# Patient Record
Sex: Female | Born: 1955
Health system: Southern US, Community
[De-identification: ages and names within clinical notes are randomized; demographics above are authoritative.]

## PROBLEM LIST (undated history)

## (undated) DIAGNOSIS — I10 Essential (primary) hypertension: Secondary | ICD-10-CM

## (undated) DIAGNOSIS — K219 Gastro-esophageal reflux disease without esophagitis: Secondary | ICD-10-CM

## (undated) DIAGNOSIS — M109 Gout, unspecified: Secondary | ICD-10-CM

## (undated) DIAGNOSIS — M1990 Unspecified osteoarthritis, unspecified site: Secondary | ICD-10-CM

## (undated) DIAGNOSIS — J40 Bronchitis, not specified as acute or chronic: Secondary | ICD-10-CM

## (undated) DIAGNOSIS — J449 Chronic obstructive pulmonary disease, unspecified: Secondary | ICD-10-CM

## (undated) HISTORY — PX: OTHER SURGICAL HISTORY: SHX169

## (undated) HISTORY — PX: TONSILLECTOMY: SUR1361

## (undated) HISTORY — PX: CHOLECYSTECTOMY: SHX55

---

## 2006-04-20 ENCOUNTER — Observation Stay: Payer: Self-pay | Admitting: Surgery

## 2008-02-16 ENCOUNTER — Emergency Department: Payer: Self-pay | Admitting: Emergency Medicine

## 2009-05-09 ENCOUNTER — Ambulatory Visit: Payer: Self-pay | Admitting: General Practice

## 2010-07-02 DIAGNOSIS — Z860100 Personal history of colon polyps, unspecified: Secondary | ICD-10-CM | POA: Insufficient documentation

## 2010-07-02 DIAGNOSIS — K648 Other hemorrhoids: Secondary | ICD-10-CM | POA: Insufficient documentation

## 2010-08-17 DIAGNOSIS — F17200 Nicotine dependence, unspecified, uncomplicated: Secondary | ICD-10-CM | POA: Insufficient documentation

## 2010-08-17 DIAGNOSIS — M1990 Unspecified osteoarthritis, unspecified site: Secondary | ICD-10-CM | POA: Insufficient documentation

## 2010-08-17 DIAGNOSIS — M130 Polyarthritis, unspecified: Secondary | ICD-10-CM | POA: Insufficient documentation

## 2010-08-17 DIAGNOSIS — D509 Iron deficiency anemia, unspecified: Secondary | ICD-10-CM | POA: Insufficient documentation

## 2010-08-17 DIAGNOSIS — A048 Other specified bacterial intestinal infections: Secondary | ICD-10-CM | POA: Insufficient documentation

## 2011-06-21 ENCOUNTER — Emergency Department: Payer: Self-pay | Admitting: Emergency Medicine

## 2011-07-22 ENCOUNTER — Emergency Department: Payer: Self-pay | Admitting: Emergency Medicine

## 2011-12-15 ENCOUNTER — Emergency Department: Payer: Self-pay | Admitting: Emergency Medicine

## 2012-02-28 DIAGNOSIS — K2100 Gastro-esophageal reflux disease with esophagitis, without bleeding: Secondary | ICD-10-CM | POA: Insufficient documentation

## 2012-12-04 ENCOUNTER — Ambulatory Visit: Payer: Self-pay | Admitting: Family Medicine

## 2013-01-18 ENCOUNTER — Ambulatory Visit: Payer: Self-pay | Admitting: Family Medicine

## 2013-09-25 ENCOUNTER — Emergency Department: Payer: Self-pay | Admitting: Emergency Medicine

## 2013-10-08 ENCOUNTER — Ambulatory Visit: Payer: Self-pay | Admitting: Family Medicine

## 2014-11-05 ENCOUNTER — Encounter: Payer: Self-pay | Admitting: Emergency Medicine

## 2014-11-05 ENCOUNTER — Emergency Department
Admission: EM | Admit: 2014-11-05 | Discharge: 2014-11-05 | Disposition: A | Discharge status: Home / Self Care | Payer: Medicare Other | Attending: Emergency Medicine | Admitting: Emergency Medicine

## 2014-11-05 DIAGNOSIS — I10 Essential (primary) hypertension: Secondary | ICD-10-CM | POA: Insufficient documentation

## 2014-11-05 DIAGNOSIS — N611 Abscess of the breast and nipple: Secondary | ICD-10-CM

## 2014-11-05 DIAGNOSIS — N61 Inflammatory disorders of breast: Secondary | ICD-10-CM | POA: Insufficient documentation

## 2014-11-05 DIAGNOSIS — Z720 Tobacco use: Secondary | ICD-10-CM | POA: Insufficient documentation

## 2014-11-05 HISTORY — DX: Essential (primary) hypertension: I10

## 2014-11-05 MED ORDER — SULFAMETHOXAZOLE-TRIMETHOPRIM 800-160 MG PO TABS: 1.0000 | ORAL_TABLET | Freq: Two times a day (BID) | ORAL | Status: DC

## 2014-11-05 NOTE — ED Notes (Signed)
Pt to ed with c/o drainage from left breast.  Pt states she thinks an insect may have bit her over the weekend.

## 2014-11-05 NOTE — Discharge Instructions (Signed)
Please call and schedule an appointment to have your mammogram. Follow up with your primary care provider this week. Return to the ER for symptoms that change or worsen if you are unable to schedule an appointment.  Abscess An abscess (boil or furuncle) is an infected area on or under the skin. This area is filled with yellowish-white fluid (pus) and other material (debris). HOME CARE   Only take medicines as told by your doctor.  If you were given antibiotic medicine, take it as directed. Finish the medicine even if you start to feel better.  If gauze is used, follow your doctor's directions for changing the gauze.  To avoid spreading the infection:  Keep your abscess covered with a bandage.  Wash your hands well.  Do not share personal care items, towels, or whirlpools with others.  Avoid skin contact with others.  Keep your skin and clothes clean around the abscess.  Keep all doctor visits as told. GET HELP RIGHT AWAY IF:   You have more pain, puffiness (swelling), or redness in the wound site.  You have more fluid or blood coming from the wound site.  You have muscle aches, chills, or you feel sick.  You have a fever. MAKE SURE YOU:   Understand these instructions.  Will watch your condition.  Will get help right away if you are not doing well or get worse. Document Released: 09/01/2007 Document Revised: 09/14/2011 Document Reviewed: 05/28/2011 [facility] Patient Information 2015 [place], [place]. This information is not intended to replace advice given to you by your health care provider. Make sure you discuss any questions you have with your health care provider.

## 2014-11-05 NOTE — ED Provider Notes (Signed)
[facility] Emergency Department Provider Note ____________________________________________  Time seen: Approximately 9:38 AM  I have reviewed the triage vital signs and the nursing notes.   HISTORY  Chief Complaint Abscess   HPI Jackie Macias is a 59 y.o. female who presents to the emergency department for left breast abscess. She states she noticed some draining last night from her left nipple. She has had this happen to her in the past and her primary care provider told her that it was due to the "change of life."   Past Medical History  Diagnosis Date  . Hypertension     There are no active problems to display for this patient.   History reviewed. No pertinent past surgical history.  Current Outpatient Rx  Name  Route  Sig  Dispense  Refill  . sulfamethoxazole-trimethoprim (BACTRIM DS,SEPTRA DS) 800-160 MG per tablet   Oral   Take 1 tablet by mouth 2 (two) times daily.   20 tablet   0     Allergies Review of patient's allergies indicates no known allergies.  History reviewed. No pertinent family history.  Social History History  Substance Use Topics  . Smoking status: Current Every Day Smoker  . Smokeless tobacco: Not on file  . Alcohol Use: Yes    Review of Systems   Constitutional: No fever/chills Eyes: No visual changes. ENT: No congestion or rhinorrhea Cardiovascular: Denies chest pain. Respiratory: Denies shortness of breath. Gastrointestinal: No abdominal pain.  No nausea, no vomiting.  No diarrhea.  No constipation. Genitourinary: Negative for dysuria. Musculoskeletal: Negative for back pain. Skin: Left nipple draining yellow fluid. Neurological: Negative for headaches, focal weakness or numbness.  10-point ROS otherwise negative.  ____________________________________________   PHYSICAL EXAM:  VITAL SIGNS: ED Triage Vitals  Enc Vitals Group     BP 11/05/14 0858 170/82 mmHg     Pulse Rate 11/05/14 0858 83      Resp 11/05/14 0858 18     Temp 11/05/14 0858 98.1 F (36.7 C)     Temp Source 11/05/14 0858 Oral     SpO2 11/05/14 0858 99 %     Weight 11/05/14 0858 234 lb (106.142 kg)     Height 11/05/14 0858 5\' 6"  (1.676 m)     Head Cir --      Peak Flow --      Pain Score 11/05/14 0859 8     Pain Loc --      Pain Edu? --      Excl. in [place]? --     Constitutional: Alert and oriented. Well appearing and in no acute distress. Eyes: Conjunctivae are normal. PERRL. EOMI. Head: Atraumatic. Nose: No congestion/rhinnorhea. Mouth/Throat: Mucous membranes are moist.  Oropharynx non-erythematous. No oral lesions. Neck: No stridor. Breast: No dimpling, no peau d' orange. Left nipple is mildly edematous with scant amount of purulent drainage on the lateral side without cellulitis. Cardiovascular: Normal rate, regular rhythm.  Good peripheral circulation. Respiratory: Normal respiratory effort.  No retractions. Gastrointestinal: Soft and nontender. No distention. No abdominal bruits. Musculoskeletal: No lower extremity tenderness nor edema.  No joint effusions. Neurologic:  Normal speech and language. No gross focal neurologic deficits are appreciated. Speech is normal. No gait instability. Skin: See breast exam Psychiatric: Mood and affect are normal. Speech and behavior are normal.  ____________________________________________   LABS (all labs ordered are listed, but only abnormal results are displayed)  Labs Reviewed - No data to display ____________________________________________  EKG   ____________________________________________  RADIOLOGY   ____________________________________________   PROCEDURES  Procedure(s) performed: None ____________________________________________   INITIAL IMPRESSION / ASSESSMENT AND PLAN / ED COURSE  Pertinent labs & imaging results that were available during my care of the patient were reviewed by me and considered in my medical decision making  (see chart for details).  Patient was advised to take the antibiotics until finished. She was advised to follow-up with her primary care provider to have a mammogram. Patient states that he has been 2 years since her last mammogram and she had a sibling and aunt die from breast cancer. She was advised of the importance of having a mammogram soon. Patient states that she plans on calling today to schedule an appointment with her primary care provider. ____________________________________________   FINAL CLINICAL IMPRESSION(S) / ED DIAGNOSES  Final diagnoses:  Abscess of nipple, left      Victorino Dike, FNP 11/05/14 [place], MD 11/05/14 1348

## 2014-12-04 ENCOUNTER — Other Ambulatory Visit: Payer: Self-pay | Admitting: Family Medicine

## 2014-12-04 DIAGNOSIS — Z1231 Encounter for screening mammogram for malignant neoplasm of breast: Secondary | ICD-10-CM

## 2014-12-13 ENCOUNTER — Ambulatory Visit: Payer: Medicare Other

## 2014-12-26 ENCOUNTER — Other Ambulatory Visit: Payer: Self-pay | Admitting: Family Medicine

## 2014-12-26 ENCOUNTER — Ambulatory Visit
Admission: RE | Admit: 2014-12-26 | Discharge: 2014-12-26 | Disposition: A | Discharge status: Home / Self Care | Payer: Medicare Other | Source: Ambulatory Visit | Attending: Family Medicine | Admitting: Family Medicine

## 2014-12-26 DIAGNOSIS — Z1231 Encounter for screening mammogram for malignant neoplasm of breast: Secondary | ICD-10-CM | POA: Diagnosis not present

## 2015-04-29 ENCOUNTER — Other Ambulatory Visit: Payer: Self-pay | Admitting: Otolaryngology

## 2015-04-29 DIAGNOSIS — R221 Localized swelling, mass and lump, neck: Secondary | ICD-10-CM

## 2015-05-05 ENCOUNTER — Ambulatory Visit: Admission: RE | Admit: 2015-05-05 | Payer: Medicare Other | Source: Ambulatory Visit

## 2015-05-22 ENCOUNTER — Ambulatory Visit
Admission: RE | Admit: 2015-05-22 | Discharge: 2015-05-22 | Disposition: A | Discharge status: Home / Self Care | Payer: Medicare Other | Source: Ambulatory Visit | Attending: Otolaryngology | Admitting: Otolaryngology

## 2015-05-22 DIAGNOSIS — J351 Hypertrophy of tonsils: Secondary | ICD-10-CM | POA: Diagnosis not present

## 2015-05-22 DIAGNOSIS — R221 Localized swelling, mass and lump, neck: Secondary | ICD-10-CM | POA: Diagnosis not present

## 2015-05-22 DIAGNOSIS — I6522 Occlusion and stenosis of left carotid artery: Secondary | ICD-10-CM | POA: Diagnosis not present

## 2015-05-22 LAB — POCT I-STAT CREATININE: Creatinine, Ser: 0.8 mg/dL (ref 0.44–1.00)

## 2015-05-22 MED ORDER — IOHEXOL 350 MG/ML SOLN
75.0000 mL | Freq: Once | INTRAVENOUS | Status: AC | PRN
  Administered 2015-05-22: 75 mL via INTRAVENOUS

## 2015-06-13 ENCOUNTER — Other Ambulatory Visit: Payer: Self-pay | Admitting: Otolaryngology

## 2015-06-13 DIAGNOSIS — R0989 Other specified symptoms and signs involving the circulatory and respiratory systems: Secondary | ICD-10-CM

## 2015-06-19 ENCOUNTER — Other Ambulatory Visit: Payer: Self-pay | Admitting: Radiology

## 2015-06-20 ENCOUNTER — Ambulatory Visit
Admission: RE | Admit: 2015-06-20 | Discharge: 2015-06-20 | Disposition: A | Discharge status: Home / Self Care | Payer: Medicare Other | Source: Ambulatory Visit | Attending: Otolaryngology | Admitting: Otolaryngology

## 2015-06-20 DIAGNOSIS — R0989 Other specified symptoms and signs involving the circulatory and respiratory systems: Secondary | ICD-10-CM

## 2015-06-20 DIAGNOSIS — I10 Essential (primary) hypertension: Secondary | ICD-10-CM | POA: Insufficient documentation

## 2015-06-20 DIAGNOSIS — K118 Other diseases of salivary glands: Secondary | ICD-10-CM | POA: Insufficient documentation

## 2015-06-20 DIAGNOSIS — F17200 Nicotine dependence, unspecified, uncomplicated: Secondary | ICD-10-CM | POA: Insufficient documentation

## 2015-06-20 DIAGNOSIS — Z803 Family history of malignant neoplasm of breast: Secondary | ICD-10-CM | POA: Diagnosis not present

## 2015-06-20 DIAGNOSIS — R221 Localized swelling, mass and lump, neck: Secondary | ICD-10-CM | POA: Insufficient documentation

## 2015-06-20 MED ORDER — SODIUM CHLORIDE 0.9 % IV SOLN
Freq: Once | INTRAVENOUS | Status: AC
  Administered 2015-06-20: 11:00:00 via INTRAVENOUS

## 2015-06-20 MED ORDER — FENTANYL CITRATE (PF) 100 MCG/2ML IJ SOLN
INTRAMUSCULAR | Status: AC | PRN
  Administered 2015-06-20 (×2): 50 ug via INTRAVENOUS

## 2015-06-20 MED ORDER — MIDAZOLAM HCL 2 MG/2ML IJ SOLN
INTRAMUSCULAR | Status: AC | PRN
  Administered 2015-06-20 (×2): 1 mg via INTRAVENOUS

## 2015-06-20 NOTE — H&P (Signed)
Chief Complaint: Patient was seen in consultation today for right parotid lesion biopsy at the request of [place]  Referring Physician(s): Vaught,Creighton  History of Present Illness: Jackie Macias is a 60 y.o. female with a palpable nodule in right parotid region for months.  Recent CT examination demonstrated a dominant right parotid lesion and additional parotid lesions or lymph nodes.  Request for a core biopsy of the dominant right parotid lesion.  Patient is asymptomatic today.  She says the nodule in right upper neck is mildly tender to touch.    Past Medical History  Diagnosis Date  . Hypertension     No past surgical history on file.  Allergies: Review of patient's allergies indicates no known allergies.  Medications: Prior to Admission medications   Medication Sig Start Date End Date Taking? Authorizing Provider  amLODipine (NORVASC) 5 MG tablet Take 5 mg by mouth daily.   Yes Historical Provider, MD  ibuprofen (ADVIL,MOTRIN) 800 MG tablet Take 800 mg by mouth 2 times daily at 12 noon and 4 pm. And prn   Yes Historical Provider, MD  meloxicam (MOBIC) 7.5 MG tablet Take 7.5 mg by mouth 2 (two) times daily.   Yes Historical Provider, MD  ranitidine (ZANTAC) 150 MG capsule Take 150 mg by mouth 2 (two) times daily.   Yes Historical Provider, MD  sulfamethoxazole-trimethoprim (BACTRIM DS,SEPTRA DS) 800-160 MG per tablet Take 1 tablet by mouth 2 (two) times daily. Patient not taking: Reported on 06/20/2015 11/05/14   Victorino Dike, FNP     Family History  Problem Relation Age of Onset  . Breast cancer Sister 25    Social History   Social History  . Marital Status: Single    Spouse Name: N/A  . Number of Children: N/A  . Years of Education: N/A   Social History Main Topics  . Smoking status: Current Every Day Smoker  . Smokeless tobacco: Not on file  . Alcohol Use: Yes  . Drug Use: No  . Sexual Activity: Not on file   Other Topics Concern  . Not  on file   Social History Narrative      Review of Systems  Respiratory: Negative.   Cardiovascular: Negative.   Gastrointestinal: Negative.   Genitourinary: Negative.     Vital Signs: BP 174/84 mmHg  Resp 18  Ht 5\' 6"  (1.676 m)  Wt 230 lb (104.327 kg)  BMI 37.14 kg/m2  SpO2 99%  Physical Exam  Neck:  Palpable nodule in right parotid region, anterior to ear.  1 or 2 nodule structures just below the right mandible angle.    Cardiovascular: Normal rate, regular rhythm and normal heart sounds.   Pulmonary/Chest: Effort normal and breath sounds normal.    Mallampati Score:  MD Evaluation Airway: WNL Heart: WNL Chest/ Lungs: WNL ASA  Classification: 1  Imaging: Ct Soft Tissue Neck W Contrast  05/22/2015  CLINICAL DATA:  Palpable mass right neck 8 months.  Nonpainful EXAM: CT NECK WITH CONTRAST TECHNIQUE: Multidetector CT imaging of the neck was performed using the standard protocol following the bolus administration of intravenous contrast. CONTRAST:  57mL OMNIPAQUE IOHEXOL 350 MG/ML SOLN COMPARISON:  None. FINDINGS: Pharynx and larynx: Mild asymmetry of the right tonsil. Recommend direct visualization. No abscess. Tumor not excluded. Left tonsil normal. No airway compromise. Mild thickening of the soft palate. Epiglottis and larynx normal. Salivary glands: Multiple solid enhancing homogeneous nodules in the right parotid gland. The largest is located in the upper parotid  gland along the superior surface and measures 20 x 26 mm. This could be extra parotid and could be a lymph node. 12 mm intra parotid enhancing nodule in the midportion. 16 mm enhancing nodule just below the right parotid which appears extra parotid in location. 7 mm enhancing solid nodule left superior parotid. These could be multifocal parotid lesions versus lymph nodes. Submandibular gland normal bilaterally. Thyroid: Negative Lymph nodes: Possible parotid lymph nodes bilaterally as described above. No additional  adenopathy in the neck or upper mediastinum. Vascular: Atherosclerotic disease in the carotid bifurcation bilaterally with significant carotid stenosis on the left. Carotid Doppler versus CTA recommended for further evaluation. Jugular vein patent bilaterally. Limited intracranial: Negative Visualized orbits: Negative Mastoids and visualized paranasal sinuses: Negative Skeleton: Negative for cervical spine fracture. No acute bone lesion. Upper chest: Lung apices clear IMPRESSION: Multiple solid enhancing nodules in and around the right parotid gland. Smaller nodule left parotid gland. These may represent multiple lymph nodes. Metastatic disease and lymphoma is a possibility. Multifocal parotid tumor is also a consideration however pleomorphic adenoma is not typically multifocal and Warthin's tumor typically is not homogeneous and solid in appearance on CT. No additional adenopathy in the neck. Enlargement of the right tonsil. Recommend direct visualization and possible biopsy to rule out tumor of the right tonsil. Significant left carotid stenosis. Recommend follow-up CTA of the neck or carotid Doppler. Electronically Signed   By: Franchot Gallo M.D.   On: 05/22/2015 12:08    Labs:  CBC: No results for input(s): WBC, HGB, HCT, PLT in the last 8760 hours.  COAGS: No results for input(s): INR, APTT in the last 8760 hours.  BMP:  Recent Labs  05/22/15 1008  CREATININE 0.80    LIVER FUNCTION TESTS: No results for input(s): BILITOT, AST, ALT, ALKPHOS, PROT, ALBUMIN in the last 8760 hours.  TUMOR MARKERS: No results for input(s): AFPTM, CEA, CA199, CHROMGRNA in the last 8760 hours.  Assessment and Plan:  60 yo with parotid lesions and needs a tissue diagnosis.  Discussed US guided core biopsy of the dominant right parotid lesion.  Risks include bleeding, infection and facial nerve injury.  Patient is agreeable to the procedure and would like to be sedated.  Plan for moderate sedation.     Electronically Signed: Carylon Perches 06/20/2015, 10:45 AM

## 2015-06-20 NOTE — Procedures (Signed)
US guided core biopsies of right parotid lesion.  Total of 7 - 18 gauge cores obtained.  No immediate complication.  Minimal blood loss.

## 2015-06-20 NOTE — Discharge Instructions (Signed)
Needle Biopsy, Care After °These instructions give you information about caring for yourself after your procedure. Your doctor may also give you more specific instructions. Call your doctor if you have any problems or questions after your procedure. °HOME CARE °· Rest as told by your doctor. °· Take medicines only as told by your doctor. °· There are many different ways to close and cover the biopsy site, including stitches (sutures), skin glue, and adhesive strips. Follow instructions from your doctor about: °¨ How to take care of your biopsy site. °¨ When and how you should change your bandage (dressing). °¨ When you should remove your dressing. °¨ Removing whatever was used to close your biopsy site. °· Check your biopsy site every day for signs of infection. Watch for: °¨ Redness, swelling, or pain. °¨ Fluid, blood, or pus. °GET HELP IF: °· You have a fever. °· You have redness, swelling, or pain at the biopsy site, and it lasts longer than a few days. °· You have fluid, blood, or pus coming from the biopsy site. °· You feel sick to your stomach (nauseous). °· You throw up (vomit). °GET HELP RIGHT AWAY IF: °· You are short of breath. °· You have trouble breathing. °· Your chest hurts. °· You feel dizzy or you pass out (faint). °· You have bleeding that does not stop with pressure or a bandage. °· You cough up blood. °· Your belly (abdomen) hurts. °  °This information is not intended to replace advice given to you by your health care provider. Make sure you discuss any questions you have with your health care provider. °  °Document Released: 02/26/2008 Document Revised: 07/30/2014 Document Reviewed: 03/11/2014 °Elsevier Interactive Patient Education ©2016 [employer] Inc[employer] ° °

## 2015-06-23 LAB — SURGICAL PATHOLOGY

## 2015-07-31 ENCOUNTER — Other Ambulatory Visit: Payer: Self-pay | Admitting: Otolaryngology

## 2015-07-31 DIAGNOSIS — R221 Localized swelling, mass and lump, neck: Secondary | ICD-10-CM

## 2015-08-08 ENCOUNTER — Other Ambulatory Visit: Payer: Self-pay | Admitting: Radiology

## 2015-08-11 ENCOUNTER — Ambulatory Visit
Admission: RE | Admit: 2015-08-11 | Discharge: 2015-08-11 | Disposition: A | Discharge status: Home / Self Care | Payer: Medicare Other | Source: Ambulatory Visit | Attending: Otolaryngology | Admitting: Otolaryngology

## 2015-08-11 MED ORDER — SODIUM CHLORIDE 0.9 % IV SOLN: Freq: Once | INTRAVENOUS | Status: DC

## 2015-08-12 ENCOUNTER — Other Ambulatory Visit: Payer: Self-pay | Admitting: Radiology

## 2015-08-14 ENCOUNTER — Ambulatory Visit
Admission: RE | Admit: 2015-08-14 | Discharge: 2015-08-14 | Disposition: A | Discharge status: Home / Self Care | Payer: Medicare Other | Source: Ambulatory Visit | Attending: Otolaryngology | Admitting: Otolaryngology

## 2015-08-14 NOTE — OR Nursing (Signed)
Dr Pryor Ochoa office notified Pt did not come in for lymph node biopsy, I had left message on pt phone to call to reschedule.

## 2015-11-17 ENCOUNTER — Emergency Department: Payer: Medicare Other

## 2015-11-17 ENCOUNTER — Encounter: Payer: Self-pay | Admitting: Emergency Medicine

## 2015-11-17 ENCOUNTER — Emergency Department
Admission: EM | Admit: 2015-11-17 | Discharge: 2015-11-17 | Disposition: A | Discharge status: Home / Self Care | Payer: Medicare Other | Attending: Emergency Medicine | Admitting: Emergency Medicine

## 2015-11-17 DIAGNOSIS — M25551 Pain in right hip: Secondary | ICD-10-CM

## 2015-11-17 DIAGNOSIS — Z79899 Other long term (current) drug therapy: Secondary | ICD-10-CM | POA: Insufficient documentation

## 2015-11-17 DIAGNOSIS — M1611 Unilateral primary osteoarthritis, right hip: Secondary | ICD-10-CM | POA: Insufficient documentation

## 2015-11-17 DIAGNOSIS — F17200 Nicotine dependence, unspecified, uncomplicated: Secondary | ICD-10-CM | POA: Diagnosis not present

## 2015-11-17 DIAGNOSIS — M1610 Unilateral primary osteoarthritis, unspecified hip: Secondary | ICD-10-CM

## 2015-11-17 DIAGNOSIS — I10 Essential (primary) hypertension: Secondary | ICD-10-CM | POA: Insufficient documentation

## 2015-11-17 MED ORDER — OXYCODONE-ACETAMINOPHEN 5-325 MG PO TABS: 1.0000 | ORAL_TABLET | ORAL | 0 refills | Status: DC | PRN

## 2015-11-17 MED ORDER — KETOROLAC TROMETHAMINE 30 MG/ML IJ SOLN
60.0000 mg | Freq: Once | INTRAMUSCULAR | Status: AC
  Administered 2015-11-17: 60 mg via INTRAMUSCULAR
  Filled 2015-11-17: qty 2

## 2015-11-17 MED ORDER — NAPROXEN 500 MG PO TABS: 500.0000 mg | ORAL_TABLET | Freq: Two times a day (BID) | ORAL | 0 refills | Status: DC

## 2015-11-17 NOTE — ED Provider Notes (Signed)
[facility] Emergency Department Provider Note  ____________________________________________  Time seen: Approximately 11:58 AM  I have reviewed the triage vital signs and the nursing notes.   HISTORY  Chief Complaint Hip Pain    HPI Jackie Macias is a 60 y.o. female presents for evaluation of right hip and pelvis pain. Patient states past medical history significant for the same. Is increased over the last several days. In addition she complains of nonspecific left antecubital fossa pain. Denies any trauma.   Past Medical History:  Diagnosis Date  . Hypertension     There are no active problems to display for this patient.   History reviewed. No pertinent surgical history.  Prior to Admission medications   Medication Sig Start Date End Date Taking? Authorizing Provider  amLODipine (NORVASC) 5 MG tablet Take 5 mg by mouth daily.    Historical Provider, MD  ibuprofen (ADVIL,MOTRIN) 800 MG tablet Take 800 mg by mouth 2 times daily at 12 noon and 4 pm. And prn    Historical Provider, MD  meloxicam (MOBIC) 7.5 MG tablet Take 7.5 mg by mouth 2 (two) times daily.    Historical Provider, MD  naproxen (NAPROSYN) 500 MG tablet Take 1 tablet (500 mg total) by mouth 2 (two) times daily with a meal. 11/17/15   Arlyss Repress, PA-C  oxyCODONE-acetaminophen (ROXICET) 5-325 MG tablet Take 1-2 tablets by mouth every 4 (four) hours as needed for severe pain. 11/17/15   Pierce Crane Angelia Hazell, PA-C  ranitidine (ZANTAC) 150 MG capsule Take 150 mg by mouth 2 (two) times daily.    Historical Provider, MD  sulfamethoxazole-trimethoprim (BACTRIM DS,SEPTRA DS) 800-160 MG per tablet Take 1 tablet by mouth 2 (two) times daily. Patient not taking: Reported on 06/20/2015 11/05/14   Victorino Dike, FNP    Allergies Review of patient's allergies indicates no known allergies.  Family History  Problem Relation Age of Onset  . Breast cancer Sister 7    Social History Social History   Substance Use Topics  . Smoking status: Current Every Day Smoker  . Smokeless tobacco: Former Systems developer  . Alcohol use Yes    Review of Systems Constitutional: No fever/chills Cardiovascular: Denies chest pain. Respiratory: Denies shortness of breath. Gastrointestinal: No abdominal pain.  No nausea, no vomiting.  No diarrhea.  No constipation. Musculoskeletal: positive for right hip and pelvis pain. Positive for left antecubital fossa pain. Skin: Negative for rash. Neurological: Negative for headaches, focal weakness or numbness.  10-point ROS otherwise negative.  ____________________________________________   PHYSICAL EXAM:  VITAL SIGNS: ED Triage Vitals  Enc Vitals Group     BP 11/17/15 1138 (!) 146/80     Pulse Rate 11/17/15 1138 79     Resp 11/17/15 1138 18     Temp 11/17/15 1138 98.6 F (37 C)     Temp Source 11/17/15 1138 Oral     SpO2 11/17/15 1138 98 %     Weight 11/17/15 1139 234 lb (106.1 kg)     Height 11/17/15 1139 5\' 6"  (1.676 m)     Head Circumference --      Peak Flow --      Pain Score 11/17/15 1139 8     Pain Loc --      Pain Edu? --      Excl. in [place]? --     Constitutional: Alert and oriented. Well appearing and in no acute distress. Cardiovascular: Normal rate, regular rhythm. Grossly normal heart sounds.  Good peripheral  circulation. Respiratory: Normal respiratory effort.  No retractions. Lungs CTAB. Musculoskeletal: No lower extremity tenderness nor edema.  No joint effusions.Straight leg raise positive for right lower extremity. Increased point tenderness to the hip. Neurologic:  Normal speech and language. No gross focal neurologic deficits are appreciated. No gait instability. Skin:  Skin is warm, dry and intact. No rash noted. Psychiatric: Mood and affect are normal. Speech and behavior are normal.  ____________________________________________   LABS (all labs ordered are listed, but only abnormal results are displayed)  Labs Reviewed - No  data to display ____________________________________________  EKG   ____________________________________________  RADIOLOGY  IMPRESSION:  Advanced osteoarthritic changes of the right hip with near complete  effacement of the joint space, multiple subchondral erosions and  cysts and small osteophyte formation.    ____________________________________________   PROCEDURES  Procedure(s) performed: None  Critical Care performed: No  ____________________________________________   INITIAL IMPRESSION / ASSESSMENT AND PLAN / ED COURSE  Pertinent labs & imaging results that were available during my care of the patient were reviewed by me and considered in my medical decision making (see chart for details). Review of the [place] CSRS was performed in accordance of the [place] prior to dispensing any controlled drugs.  Severe osteoarthritis of the right hip. Reassurance provided to the patient follow-up with orthopedics on-call. Rx given for Percocet and Naprosyn. She voices no other emergency medical complaints at this time.  Clinical Course    ____________________________________________   FINAL CLINICAL IMPRESSION(S) / ED DIAGNOSES  Final diagnoses:  Right hip pain  Osteoarthritis of one hip, unspecified osteoarthritis type     This chart was dictated using voice recognition software/Dragon. Despite best efforts to proofread, errors can occur which can change the meaning. Any change was purely unintentional.    Arlyss Repress, PA-C 11/17/15 1323    Earleen Newport, MD 11/17/15 1359

## 2015-11-17 NOTE — ED Triage Notes (Signed)
Pt presents with right hip pain for years. Has seen Dr Sabra Heck in the past for same. No injury.

## 2016-02-03 ENCOUNTER — Emergency Department
Admission: EM | Admit: 2016-02-03 | Discharge: 2016-02-03 | Discharge status: Against Medical Advice | Payer: Medicare Other | Attending: Emergency Medicine | Admitting: Emergency Medicine

## 2016-02-03 ENCOUNTER — Emergency Department: Payer: Medicare Other

## 2016-02-03 ENCOUNTER — Encounter: Payer: Self-pay | Admitting: Emergency Medicine

## 2016-02-03 DIAGNOSIS — Z791 Long term (current) use of non-steroidal anti-inflammatories (NSAID): Secondary | ICD-10-CM | POA: Diagnosis not present

## 2016-02-03 DIAGNOSIS — J449 Chronic obstructive pulmonary disease, unspecified: Secondary | ICD-10-CM | POA: Insufficient documentation

## 2016-02-03 DIAGNOSIS — F17200 Nicotine dependence, unspecified, uncomplicated: Secondary | ICD-10-CM | POA: Diagnosis not present

## 2016-02-03 DIAGNOSIS — Z79899 Other long term (current) drug therapy: Secondary | ICD-10-CM | POA: Diagnosis not present

## 2016-02-03 DIAGNOSIS — I10 Essential (primary) hypertension: Secondary | ICD-10-CM | POA: Diagnosis not present

## 2016-02-03 DIAGNOSIS — R7989 Other specified abnormal findings of blood chemistry: Secondary | ICD-10-CM | POA: Insufficient documentation

## 2016-02-03 DIAGNOSIS — R0602 Shortness of breath: Secondary | ICD-10-CM | POA: Diagnosis present

## 2016-02-03 DIAGNOSIS — J441 Chronic obstructive pulmonary disease with (acute) exacerbation: Secondary | ICD-10-CM

## 2016-02-03 DIAGNOSIS — R778 Other specified abnormalities of plasma proteins: Secondary | ICD-10-CM

## 2016-02-03 HISTORY — DX: Chronic obstructive pulmonary disease, unspecified: J44.9

## 2016-02-03 HISTORY — DX: Gastro-esophageal reflux disease without esophagitis: K21.9

## 2016-02-03 HISTORY — DX: Gout, unspecified: M10.9

## 2016-02-03 LAB — BASIC METABOLIC PANEL
Anion gap: 7 (ref 5–15)
BUN: 14 mg/dL (ref 6–20)
CO2: 25 mmol/L (ref 22–32)
CREATININE: 0.82 mg/dL (ref 0.44–1.00)
Calcium: 9.1 mg/dL (ref 8.9–10.3)
Chloride: 105 mmol/L (ref 101–111)
GFR calc Af Amer: >60 mL/min (ref >60)
Glucose, Bld: 112 mg/dL — ABNORMAL HIGH (ref 65–99)
Potassium: 3.7 mmol/L (ref 3.5–5.1)
SODIUM: 137 mmol/L (ref 135–145)

## 2016-02-03 LAB — CBC
HCT: 36.7 % (ref 35.0–47.0)
Hemoglobin: 12.4 g/dL (ref 12.0–16.0)
MCH: 33.6 pg (ref 26.0–34.0)
MCHC: 33.8 g/dL (ref 32.0–36.0)
MCV: 99.2 fL (ref 80.0–100.0)
PLATELETS: 266 10*3/uL (ref 150–440)
RBC: 3.7 MIL/uL — ABNORMAL LOW (ref 3.80–5.20)
RDW: 14.7 % — AB (ref 11.5–14.5)
WBC: 6.1 10*3/uL (ref 3.6–11.0)

## 2016-02-03 LAB — TROPONIN I: Troponin I: 0.07 ng/mL (ref <0.03)

## 2016-02-03 MED ORDER — ALBUTEROL SULFATE (2.5 MG/3ML) 0.083% IN NEBU
5.0000 mg | INHALATION_SOLUTION | Freq: Once | RESPIRATORY_TRACT | Status: AC
  Administered 2016-02-03: 5 mg via RESPIRATORY_TRACT
  Filled 2016-02-03: qty 6

## 2016-02-03 MED ORDER — IPRATROPIUM-ALBUTEROL 0.5-2.5 (3) MG/3ML IN SOLN: 3.0000 mL | Freq: Once | RESPIRATORY_TRACT | Status: DC

## 2016-02-03 MED ORDER — ALBUTEROL SULFATE (2.5 MG/3ML) 0.083% IN NEBU
INHALATION_SOLUTION | RESPIRATORY_TRACT | Status: AC
  Filled 2016-02-03: qty 3

## 2016-02-03 NOTE — ED Notes (Signed)
Patient transported to radiology

## 2016-02-03 NOTE — ED Notes (Signed)
Patient explained risks of leaving. Verbalizes understanding. Signed AMA form.

## 2016-02-03 NOTE — ED Provider Notes (Signed)
[facility] Emergency Department Provider Note        Time seen: ----------------------------------------- 1:27 PM on 02/03/2016 -----------------------------------------    I have reviewed the triage vital signs and the nursing notes.   HISTORY  Chief Complaint Shortness of Breath    HPI Jackie Macias is a 60 y.o. female who presents to the ER for shortness of breath last 4 days. Patient is tachypnea but able to talk in complete sentences with no accessory muscle use on arrival. She reports productive cough with yellow-green mucus for the last 4 days. She's not had any feverswakes up sweating for the past few nights. She has a history of COPD, has been using inhalers, has been placed on steroids by her doctor.   Past Medical History:  Diagnosis Date  . COPD (chronic obstructive pulmonary disease) ([place])   . GERD (gastroesophageal reflux disease)   . Gout   . Hypertension     There are no active problems to display for this patient.   Past Surgical History:  Procedure Laterality Date  . CHOLECYSTECTOMY      Allergies Patient has no known allergies.  Social History Social History  Substance Use Topics  . Smoking status: Current Every Day Smoker    Packs/day: 0.50  . Smokeless tobacco: Former Systems developer  . Alcohol use Yes     Comment: occasional    Review of Systems Constitutional: Negative for fever. Cardiovascular: Negative for chest pain. Respiratory: Positive for shortness of breath, cough Gastrointestinal: Negative for abdominal pain, vomiting and diarrhea. Genitourinary: Negative for dysuria. Musculoskeletal: Negative for back pain. Skin: Negative for rash. Neurological: Negative for headaches, focal weakness or numbness.  10-point ROS otherwise negative.  ____________________________________________   PHYSICAL EXAM:  VITAL SIGNS: ED Triage Vitals  Enc Vitals Group     BP 02/03/16 1147 (!) 125/98     Pulse Rate 02/03/16  1147 74     Resp 02/03/16 1147 (!) 22     Temp 02/03/16 1147 98.8 F (37.1 C)     Temp Source 02/03/16 1147 Oral     SpO2 02/03/16 1147 96 %     Weight 02/03/16 1148 234 lb (106.1 kg)     Height 02/03/16 1148 5\' 6"  (1.676 m)     Head Circumference --      Peak Flow --      Pain Score 02/03/16 1148 8     Pain Loc --      Pain Edu? --      Excl. in [place]? --     Constitutional: Alert and oriented. Well appearing and in no distress. Eyes: Conjunctivae are normal. PERRL. Normal extraocular movements. ENT   Head: Normocephalic and atraumatic.   Nose: No congestion/rhinnorhea.   Mouth/Throat: Mucous membranes are moist.   Neck: No stridor. Cardiovascular: Normal rate, regular rhythm. No murmurs, rubs, or gallops. Respiratory: Mild wheezing, rhonchi Gastrointestinal: Soft and nontender. Normal bowel sounds Musculoskeletal: Nontender with normal range of motion in all extremities. No lower extremity tenderness nor edema. Neurologic:  Normal speech and language. No gross focal neurologic deficits are appreciated.  Skin:  Skin is warm, dry and intact. No rash noted. Psychiatric: Mood and affect are normal. Speech and behavior are normal.  ____________________________________________  EKG: Interpreted by me. Sinus rhythm with sinus arrhythmia, rate is 71 bpm, normal PR interval, normal QRS, normal QT, normal axis.  ____________________________________________  ED COURSE:  Pertinent labs & imaging results that were available during my care of the patient  were reviewed by me and considered in my medical decision making (see chart for details). Clinical Course   Patient is in no distress, we will assess with labs and imaging. She'll receive a DuoNeb.  Procedures ____________________________________________   LABS (pertinent positives/negatives)  Labs Reviewed  BASIC METABOLIC PANEL - Abnormal; Notable for the following:       Result Value   Glucose, Bld 112 (*)    All  other components within normal limits  CBC - Abnormal; Notable for the following:    RBC 3.70 (*)    RDW 14.7 (*)    All other components within normal limits  TROPONIN I - Abnormal; Notable for the following:    Troponin I 0.07 (*)    All other components within normal limits    RADIOLOGY Images were viewed by me  Chest x-ray IMPRESSION: 1. Cannot exclude right hilar adenopathy.  2. Cardiomegaly.  No evidence of overt congestive heart failure.  3. Low lung volumes.  ____________________________________________  FINAL ASSESSMENT AND PLAN  COPD exacerbation, elevated troponin  Plan: Patient with labs and imaging as dictated above. Patient was shortness of breath which was likely due to COPD exacerbation. Troponin was elevated and I described this to her in detail that this was related to stress on her heart. I advised that she may have had a heart attack. She has advised she will not be staying and does not want further treatment. She left AGAINST MEDICAL ADVICE.   Earleen Newport, MD   Note: This dictation was prepared with Dragon dictation. Any transcriptional errors that result from this process are unintentional    Earleen Newport, MD 02/03/16 1401

## 2016-02-03 NOTE — ED Notes (Signed)
Patient requesting to leave AMA.  MD notified 

## 2016-02-03 NOTE — ED Triage Notes (Signed)
Pt reports SOB for the past 4 days. In triage, patient is tachypnic but able to talk in complete sentences with no accessory muscle use. Pt reports productive cough with yellow and green mucus for 4 days. Pt unsure of fevers but reports waking up sweating the past few nights. Patient has history of COPD.

## 2016-02-11 ENCOUNTER — Other Ambulatory Visit: Payer: Self-pay | Admitting: Family Medicine

## 2016-02-11 DIAGNOSIS — Z1231 Encounter for screening mammogram for malignant neoplasm of breast: Secondary | ICD-10-CM

## 2016-03-23 ENCOUNTER — Ambulatory Visit: Payer: Medicare Other | Attending: Family Medicine

## 2016-06-15 ENCOUNTER — Ambulatory Visit: Payer: Medicare Other | Attending: Family Medicine

## 2016-08-03 ENCOUNTER — Ambulatory Visit
Admission: RE | Admit: 2016-08-03 | Discharge: 2016-08-03 | Disposition: A | Discharge status: Home / Self Care | Payer: Medicare Other | Source: Ambulatory Visit | Attending: Family Medicine | Admitting: Family Medicine

## 2016-08-03 DIAGNOSIS — Z1231 Encounter for screening mammogram for malignant neoplasm of breast: Secondary | ICD-10-CM | POA: Diagnosis present

## 2016-12-27 HISTORY — PX: JOINT REPLACEMENT: SHX530

## 2017-01-05 ENCOUNTER — Encounter
Admission: RE | Admit: 2017-01-05 | Discharge: 2017-01-05 | Disposition: A | Discharge status: Home / Self Care | Payer: Medicare Other | Source: Ambulatory Visit | Attending: Orthopedic Surgery | Admitting: Orthopedic Surgery

## 2017-01-05 ENCOUNTER — Ambulatory Visit: Payer: Self-pay | Admitting: Orthopedic Surgery

## 2017-01-05 DIAGNOSIS — Z01810 Encounter for preprocedural cardiovascular examination: Secondary | ICD-10-CM | POA: Diagnosis present

## 2017-01-05 DIAGNOSIS — J449 Chronic obstructive pulmonary disease, unspecified: Secondary | ICD-10-CM | POA: Insufficient documentation

## 2017-01-05 DIAGNOSIS — R9431 Abnormal electrocardiogram [ECG] [EKG]: Secondary | ICD-10-CM | POA: Insufficient documentation

## 2017-01-05 DIAGNOSIS — Z01812 Encounter for preprocedural laboratory examination: Secondary | ICD-10-CM | POA: Insufficient documentation

## 2017-01-05 DIAGNOSIS — M1611 Unilateral primary osteoarthritis, right hip: Secondary | ICD-10-CM | POA: Insufficient documentation

## 2017-01-05 HISTORY — DX: Unspecified osteoarthritis, unspecified site: M19.90

## 2017-01-05 HISTORY — DX: Bronchitis, not specified as acute or chronic: J40

## 2017-01-05 LAB — URINALYSIS, COMPLETE (UACMP) WITH MICROSCOPIC
Bilirubin Urine: NEGATIVE
Glucose, UA: NEGATIVE mg/dL
Ketones, ur: NEGATIVE mg/dL
Leukocytes, UA: NEGATIVE
Nitrite: NEGATIVE
PROTEIN: NEGATIVE mg/dL
SPECIFIC GRAVITY, URINE: 1.021 (ref 1.005–1.030)
pH: 6 (ref 5.0–8.0)

## 2017-01-05 LAB — TYPE AND SCREEN
ABO/RH(D): A POS
ANTIBODY SCREEN: NEGATIVE

## 2017-01-05 LAB — SURGICAL PCR SCREEN
MRSA, PCR: NEGATIVE
Staphylococcus aureus: NEGATIVE

## 2017-01-05 LAB — BASIC METABOLIC PANEL
ANION GAP: 7 (ref 5–15)
BUN: 13 mg/dL (ref 6–20)
CALCIUM: 9.3 mg/dL (ref 8.9–10.3)
CO2: 27 mmol/L (ref 22–32)
Chloride: 105 mmol/L (ref 101–111)
Creatinine, Ser: 0.93 mg/dL (ref 0.44–1.00)
Glucose, Bld: 96 mg/dL (ref 65–99)
POTASSIUM: 3.5 mmol/L (ref 3.5–5.1)
SODIUM: 139 mmol/L (ref 135–145)

## 2017-01-05 LAB — CBC
HCT: 37.7 % (ref 35.0–47.0)
Hemoglobin: 12.8 g/dL (ref 12.0–16.0)
MCH: 33.7 pg (ref 26.0–34.0)
MCHC: 34 g/dL (ref 32.0–36.0)
MCV: 99.1 fL (ref 80.0–100.0)
PLATELETS: 285 10*3/uL (ref 150–440)
RBC: 3.81 MIL/uL (ref 3.80–5.20)
RDW: 15.1 % — AB (ref 11.5–14.5)
WBC: 4.7 10*3/uL (ref 3.6–11.0)

## 2017-01-05 LAB — PROTIME-INR
INR: 1.04
PROTHROMBIN TIME: 13.5 s (ref 11.4–15.2)

## 2017-01-05 LAB — APTT: APTT: 30 s (ref 24–36)

## 2017-01-05 NOTE — Pre-Procedure Instructions (Signed)
Called Dr Amie Critchley regarding abnormal EKG, medical clearance requested.  PCP and Dr Alvira Philips office notified.

## 2017-01-05 NOTE — Patient Instructions (Signed)
Your procedure is scheduled on: 01/17/17 Mon Report to Same Day Surgery 2nd floor medical mall [facility] Entrance-take elevator on left to 2nd floor.  Check in with surgery information desk.) To find out your arrival time please call [phone] between 1PM - 3PM on 01/14/17 Fri  Remember: Instructions that are not followed completely may result in serious medical risk, up to and including death, or upon the discretion of your surgeon and anesthesiologist your surgery may need to be rescheduled.    _x___ 1. Do not eat food after midnight the night before your procedure. You may drink clear liquids up to 2 hours before you are scheduled to arrive at the hospital for your procedure.  Do not drink clear liquids within 2 hours of your scheduled arrival to the hospital.  Clear liquids include  --Water or Apple juice without pulp  --Clear carbohydrate beverage such as ClearFast or Gatorade  --Black Coffee or Clear Tea (No milk, no creamers, do not add anything to                  the coffee or Tea Type 1 and type 2 diabetics should only drink water.  No gum chewing or hard candies.     __x__ 2. No Alcohol for 24 hours before or after surgery.   __x__3. No Smoking for 24 prior to surgery.   ____  4. Bring all medications with you on the day of surgery if instructed.    __x__ 5. Notify your doctor if there is any change in your medical condition     (cold, fever, infections).     Do not wear jewelry, make-up, hairpins, clips or nail polish.  Do not wear lotions, powders, or perfumes. You may wear deodorant.  Do not shave 48 hours prior to surgery. Men may shave face and neck.  Do not bring valuables to the hospital.    [facility] is not responsible for any belongings or valuables.               Contacts, dentures or bridgework may not be worn into surgery.  Leave your suitcase in the car. After surgery it may be brought to your room.  For patients admitted to the hospital, discharge  time is determined by your                       treatment team.   Patients discharged the day of surgery will not be allowed to drive home.  You will need someone to drive you home and stay with you the night of your procedure.    Please read over the following fact sheets that you were given:   [facility] Preparing for Surgery and or MRSA Information   _x___ Take anti-hypertensive listed below, cardiac, seizure, asthma,     anti-reflux and psychiatric medicines. These include:  1. amLODipine (NORVASC) 10 MG tablet  2.ranitidine (ZANTAC) 150 MG tablet  3.  4.  5.  6.  ____Fleets enema or Magnesium Citrate as directed.   _x___ Use CHG Soap or sage wipes as directed on instruction sheet   ____ Use inhalers on the day of surgery and bring to hospital day of surgery  ____ Stop Metformin and Janumet 2 days prior to surgery.    ____ Take 1/2 of usual insulin dose the night before surgery and none on the morning     surgery.   _x___ Follow recommendations from Cardiologist, Pulmonologist or PCP regarding  stopping Aspirin, Coumadin, Plavix ,Eliquis, Effient, or Pradaxa, and Pletal.  X____Stop Anti-inflammatories such as Advil, Aleve, Ibuprofen, Motrin, Naproxen, Naprosyn, Goodies powders or aspirin products. OK to take Tylenol and                          Celebrex.   _x___ Stop supplements until after surgery.  But may continue Vitamin D, Vitamin B,       and multivitamin.   ____ Bring C-Pap to the hospital.

## 2017-01-17 ENCOUNTER — Inpatient Hospital Stay: Payer: Medicare Other

## 2017-01-17 ENCOUNTER — Encounter: Payer: Self-pay | Admitting: *Deleted

## 2017-01-17 ENCOUNTER — Inpatient Hospital Stay: Payer: Medicare Other | Admitting: Certified Registered Nurse Anesthetist

## 2017-01-17 ENCOUNTER — Encounter
Admission: RE | Discharge status: Against Medical Advice | Payer: Self-pay | Source: Ambulatory Visit | Attending: Orthopedic Surgery

## 2017-01-17 ENCOUNTER — Inpatient Hospital Stay
Admission: RE | Admit: 2017-01-17 | Discharge: 2017-01-18 | DRG: 470 | Discharge status: Against Medical Advice | Payer: Medicare Other | Source: Ambulatory Visit | Attending: Orthopedic Surgery | Admitting: Orthopedic Surgery

## 2017-01-17 DIAGNOSIS — F17210 Nicotine dependence, cigarettes, uncomplicated: Secondary | ICD-10-CM | POA: Diagnosis present

## 2017-01-17 DIAGNOSIS — R51 Headache: Secondary | ICD-10-CM | POA: Diagnosis present

## 2017-01-17 DIAGNOSIS — M1710 Unilateral primary osteoarthritis, unspecified knee: Secondary | ICD-10-CM | POA: Diagnosis present

## 2017-01-17 DIAGNOSIS — Z791 Long term (current) use of non-steroidal anti-inflammatories (NSAID): Secondary | ICD-10-CM | POA: Diagnosis not present

## 2017-01-17 DIAGNOSIS — Z79899 Other long term (current) drug therapy: Secondary | ICD-10-CM

## 2017-01-17 DIAGNOSIS — Z96649 Presence of unspecified artificial hip joint: Secondary | ICD-10-CM

## 2017-01-17 DIAGNOSIS — J449 Chronic obstructive pulmonary disease, unspecified: Secondary | ICD-10-CM | POA: Diagnosis present

## 2017-01-17 DIAGNOSIS — I10 Essential (primary) hypertension: Secondary | ICD-10-CM | POA: Diagnosis present

## 2017-01-17 DIAGNOSIS — K219 Gastro-esophageal reflux disease without esophagitis: Secondary | ICD-10-CM | POA: Diagnosis present

## 2017-01-17 DIAGNOSIS — Z09 Encounter for follow-up examination after completed treatment for conditions other than malignant neoplasm: Secondary | ICD-10-CM

## 2017-01-17 DIAGNOSIS — M1611 Unilateral primary osteoarthritis, right hip: Secondary | ICD-10-CM | POA: Diagnosis present

## 2017-01-17 DIAGNOSIS — Z419 Encounter for procedure for purposes other than remedying health state, unspecified: Secondary | ICD-10-CM

## 2017-01-17 DIAGNOSIS — Z96641 Presence of right artificial hip joint: Secondary | ICD-10-CM

## 2017-01-17 HISTORY — PX: TOTAL HIP ARTHROPLASTY: SHX124

## 2017-01-17 LAB — ABO/RH: ABO/RH(D): A POS

## 2017-01-17 SURGERY — ARTHROPLASTY, HIP, TOTAL, ANTERIOR APPROACH
Anesthesia: Spinal | Site: Hip | Laterality: Right | Wound class: Clean

## 2017-01-17 MED ORDER — FENTANYL CITRATE (PF) 100 MCG/2ML IJ SOLN
INTRAMUSCULAR | Status: DC | PRN
  Administered 2017-01-17 (×2): 25 ug via INTRAVENOUS
  Administered 2017-01-17: 50 ug via INTRAVENOUS

## 2017-01-17 MED ORDER — ONDANSETRON HCL 4 MG/2ML IJ SOLN: 4.0000 mg | Freq: Four times a day (QID) | INTRAMUSCULAR | Status: DC | PRN

## 2017-01-17 MED ORDER — OXYCODONE HCL 5 MG PO TABS: 5.0000 mg | ORAL_TABLET | Freq: Once | ORAL | Status: DC | PRN

## 2017-01-17 MED ORDER — DOCUSATE SODIUM 100 MG PO CAPS
100.0000 mg | ORAL_CAPSULE | Freq: Two times a day (BID) | ORAL | Status: DC
  Administered 2017-01-17 – 2017-01-18 (×3): 100 mg via ORAL
  Filled 2017-01-17 (×3): qty 1

## 2017-01-17 MED ORDER — OXYCODONE HCL 5 MG/5ML PO SOLN: 5.0000 mg | Freq: Once | ORAL | Status: DC | PRN

## 2017-01-17 MED ORDER — GABAPENTIN 300 MG PO CAPS
300.0000 mg | ORAL_CAPSULE | Freq: Once | ORAL | Status: AC
  Administered 2017-01-17: 300 mg via ORAL

## 2017-01-17 MED ORDER — ONDANSETRON HCL 4 MG/2ML IJ SOLN
INTRAMUSCULAR | Status: AC
  Filled 2017-01-17: qty 2

## 2017-01-17 MED ORDER — LACTATED RINGERS IV SOLN: INTRAVENOUS | Status: DC

## 2017-01-17 MED ORDER — CEFAZOLIN SODIUM-DEXTROSE 2-4 GM/100ML-% IV SOLN
2.0000 g | INTRAVENOUS | Status: AC
  Administered 2017-01-17: 2 g via INTRAVENOUS

## 2017-01-17 MED ORDER — LACTATED RINGERS IV SOLN
INTRAVENOUS | Status: DC
  Administered 2017-01-17: 16:00:00 via INTRAVENOUS

## 2017-01-17 MED ORDER — CELECOXIB 200 MG PO CAPS
200.0000 mg | ORAL_CAPSULE | Freq: Two times a day (BID) | ORAL | Status: DC
  Administered 2017-01-17 – 2017-01-18 (×3): 200 mg via ORAL
  Filled 2017-01-17 (×3): qty 1

## 2017-01-17 MED ORDER — ACETAMINOPHEN 500 MG PO TABS
1000.0000 mg | ORAL_TABLET | Freq: Once | ORAL | Status: AC
  Administered 2017-01-17: 1000 mg via ORAL

## 2017-01-17 MED ORDER — CEFAZOLIN SODIUM 10 G IJ SOLR
2.0000 g | Freq: Four times a day (QID) | INTRAMUSCULAR | Status: AC
  Administered 2017-01-17 (×2): 2 g via INTRAVENOUS
  Filled 2017-01-17 (×2): qty 2000

## 2017-01-17 MED ORDER — FLEET ENEMA 7-19 GM/118ML RE ENEM: 1.0000 | ENEMA | Freq: Once | RECTAL | Status: DC | PRN

## 2017-01-17 MED ORDER — LACTATED RINGERS IV SOLN
INTRAVENOUS | Status: DC
  Administered 2017-01-17: 07:00:00 via INTRAVENOUS

## 2017-01-17 MED ORDER — SODIUM CHLORIDE FLUSH 0.9 % IV SOLN
INTRAVENOUS | Status: AC
  Filled 2017-01-17: qty 20

## 2017-01-17 MED ORDER — FAMOTIDINE 20 MG PO TABS: 10.0000 mg | ORAL_TABLET | Freq: Every day | ORAL | Status: DC | PRN

## 2017-01-17 MED ORDER — METOCLOPRAMIDE HCL 5 MG/ML IJ SOLN: 5.0000 mg | Freq: Three times a day (TID) | INTRAMUSCULAR | Status: DC | PRN

## 2017-01-17 MED ORDER — SEVOFLURANE IN SOLN
RESPIRATORY_TRACT | Status: AC
  Filled 2017-01-17: qty 250

## 2017-01-17 MED ORDER — PROMETHAZINE HCL 25 MG/ML IJ SOLN: 6.2500 mg | INTRAMUSCULAR | Status: DC | PRN

## 2017-01-17 MED ORDER — SUCCINYLCHOLINE CHLORIDE 20 MG/ML IJ SOLN
INTRAMUSCULAR | Status: AC
  Filled 2017-01-17: qty 1

## 2017-01-17 MED ORDER — PROPOFOL 500 MG/50ML IV EMUL
INTRAVENOUS | Status: AC
  Filled 2017-01-17: qty 50

## 2017-01-17 MED ORDER — DEXAMETHASONE SODIUM PHOSPHATE 10 MG/ML IJ SOLN
INTRAMUSCULAR | Status: DC | PRN
  Administered 2017-01-17: 5 mg via INTRAVENOUS

## 2017-01-17 MED ORDER — SODIUM CHLORIDE 0.9 % IR SOLN
Status: DC | PRN
  Administered 2017-01-17: 2000 mL

## 2017-01-17 MED ORDER — PHENYLEPHRINE HCL 10 MG/ML IJ SOLN
INTRAMUSCULAR | Status: DC | PRN
  Administered 2017-01-17: 200 ug via INTRAVENOUS
  Administered 2017-01-17: 100 ug via INTRAVENOUS

## 2017-01-17 MED ORDER — ONDANSETRON HCL 4 MG PO TABS: 4.0000 mg | ORAL_TABLET | Freq: Four times a day (QID) | ORAL | Status: DC | PRN

## 2017-01-17 MED ORDER — MIDAZOLAM HCL 5 MG/5ML IJ SOLN
INTRAMUSCULAR | Status: DC | PRN
  Administered 2017-01-17 (×2): 1 mg via INTRAVENOUS

## 2017-01-17 MED ORDER — MAGNESIUM HYDROXIDE 400 MG/5ML PO SUSP
30.0000 mL | Freq: Every day | ORAL | Status: DC | PRN
  Administered 2017-01-18: 30 mL via ORAL
  Filled 2017-01-17: qty 30

## 2017-01-17 MED ORDER — AMLODIPINE BESYLATE 10 MG PO TABS: 10.0000 mg | ORAL_TABLET | Freq: Every day | ORAL | Status: DC

## 2017-01-17 MED ORDER — LIDOCAINE HCL (CARDIAC) 20 MG/ML IV SOLN
INTRAVENOUS | Status: DC | PRN
  Administered 2017-01-17: 100 mg via INTRAVENOUS

## 2017-01-17 MED ORDER — BUPIVACAINE-EPINEPHRINE (PF) 0.25% -1:200000 IJ SOLN
INTRAMUSCULAR | Status: DC | PRN
  Administered 2017-01-17: 20 mL via PERINEURAL

## 2017-01-17 MED ORDER — BUPIVACAINE HCL (PF) 0.5 % IJ SOLN
INTRAMUSCULAR | Status: AC
  Filled 2017-01-17: qty 10

## 2017-01-17 MED ORDER — MIDAZOLAM HCL 2 MG/2ML IJ SOLN
INTRAMUSCULAR | Status: AC
  Filled 2017-01-17: qty 2

## 2017-01-17 MED ORDER — FENTANYL CITRATE (PF) 100 MCG/2ML IJ SOLN: 25.0000 ug | INTRAMUSCULAR | Status: DC | PRN

## 2017-01-17 MED ORDER — EPHEDRINE SULFATE 50 MG/ML IJ SOLN
INTRAMUSCULAR | Status: AC
  Filled 2017-01-17: qty 1

## 2017-01-17 MED ORDER — ASPIRIN EC 325 MG PO TBEC
325.0000 mg | DELAYED_RELEASE_TABLET | Freq: Every day | ORAL | Status: DC
  Administered 2017-01-18: 325 mg via ORAL
  Filled 2017-01-17: qty 1

## 2017-01-17 MED ORDER — ACETAMINOPHEN 500 MG PO TABS
1000.0000 mg | ORAL_TABLET | Freq: Four times a day (QID) | ORAL | Status: AC
  Administered 2017-01-17 – 2017-01-18 (×4): 1000 mg via ORAL
  Filled 2017-01-17 (×4): qty 2

## 2017-01-17 MED ORDER — MENTHOL 3 MG MT LOZG
1.0000 | LOZENGE | OROMUCOSAL | Status: DC | PRN
  Filled 2017-01-17: qty 9

## 2017-01-17 MED ORDER — DEXAMETHASONE SODIUM PHOSPHATE 10 MG/ML IJ SOLN
INTRAMUSCULAR | Status: AC
  Filled 2017-01-17: qty 1

## 2017-01-17 MED ORDER — FENTANYL CITRATE (PF) 100 MCG/2ML IJ SOLN
INTRAMUSCULAR | Status: AC
  Filled 2017-01-17: qty 2

## 2017-01-17 MED ORDER — TRANEXAMIC ACID 1000 MG/10ML IV SOLN
1000.0000 mg | INTRAVENOUS | Status: AC
  Administered 2017-01-17: 1000 mg via INTRAVENOUS
  Filled 2017-01-17: qty 10

## 2017-01-17 MED ORDER — FUROSEMIDE 20 MG PO TABS
20.0000 mg | ORAL_TABLET | Freq: Every day | ORAL | Status: DC
  Administered 2017-01-18: 20 mg via ORAL
  Filled 2017-01-17: qty 1

## 2017-01-17 MED ORDER — BACITRACIN 50000 UNITS IM SOLR
INTRAMUSCULAR | Status: AC
  Filled 2017-01-17: qty 2

## 2017-01-17 MED ORDER — SENNA 8.6 MG PO TABS
1.0000 | ORAL_TABLET | Freq: Two times a day (BID) | ORAL | Status: DC
  Administered 2017-01-17 – 2017-01-18 (×3): 8.6 mg via ORAL
  Filled 2017-01-17 (×3): qty 1

## 2017-01-17 MED ORDER — MEPERIDINE HCL 50 MG/ML IJ SOLN: 6.2500 mg | INTRAMUSCULAR | Status: DC | PRN

## 2017-01-17 MED ORDER — GLYCOPYRROLATE 0.2 MG/ML IJ SOLN
INTRAMUSCULAR | Status: AC
  Filled 2017-01-17: qty 1

## 2017-01-17 MED ORDER — CHLORHEXIDINE GLUCONATE 4 % EX LIQD: 60.0000 mL | Freq: Once | CUTANEOUS | Status: DC

## 2017-01-17 MED ORDER — SODIUM CHLORIDE 0.9 % IV SOLN
INTRAVENOUS | Status: DC | PRN
  Administered 2017-01-17: 25 ug/min via INTRAVENOUS

## 2017-01-17 MED ORDER — PHENOL 1.4 % MT LIQD
1.0000 | OROMUCOSAL | Status: DC | PRN
  Filled 2017-01-17: qty 177

## 2017-01-17 MED ORDER — PHENYLEPHRINE HCL 10 MG/ML IJ SOLN
INTRAMUSCULAR | Status: AC
  Filled 2017-01-17: qty 1

## 2017-01-17 MED ORDER — GABAPENTIN 300 MG PO CAPS
300.0000 mg | ORAL_CAPSULE | Freq: Two times a day (BID) | ORAL | Status: DC
  Administered 2017-01-17 – 2017-01-18 (×2): 300 mg via ORAL
  Filled 2017-01-17 (×2): qty 1

## 2017-01-17 MED ORDER — METOCLOPRAMIDE HCL 10 MG PO TABS
5.0000 mg | ORAL_TABLET | Freq: Three times a day (TID) | ORAL | Status: DC | PRN
Start: 2017-01-17 — End: 2017-01-18

## 2017-01-17 MED ORDER — OXYCODONE HCL 5 MG PO TABS
10.0000 mg | ORAL_TABLET | ORAL | Status: DC | PRN
  Administered 2017-01-17 – 2017-01-18 (×3): 10 mg via ORAL
  Filled 2017-01-17 (×3): qty 2

## 2017-01-17 MED ORDER — PROPOFOL 500 MG/50ML IV EMUL
INTRAVENOUS | Status: DC | PRN
  Administered 2017-01-17: 75 ug/kg/min via INTRAVENOUS

## 2017-01-17 MED ORDER — OXYCODONE HCL 5 MG PO TABS: 5.0000 mg | ORAL_TABLET | ORAL | Status: DC | PRN

## 2017-01-17 MED ORDER — PROPOFOL 10 MG/ML IV BOLUS
INTRAVENOUS | Status: DC | PRN
  Administered 2017-01-17 (×2): 10 mg via INTRAVENOUS
  Administered 2017-01-17: 30 mg via INTRAVENOUS
  Administered 2017-01-17: 20 mg via INTRAVENOUS

## 2017-01-17 MED ORDER — MORPHINE SULFATE (PF) 2 MG/ML IV SOLN
2.0000 mg | INTRAVENOUS | Status: DC | PRN
  Administered 2017-01-17: 2 mg via INTRAVENOUS
  Filled 2017-01-17: qty 1

## 2017-01-17 MED ORDER — BISACODYL 10 MG RE SUPP: 10.0000 mg | Freq: Every day | RECTAL | Status: DC | PRN

## 2017-01-17 MED ORDER — CEFAZOLIN SODIUM-DEXTROSE 2-4 GM/100ML-% IV SOLN: 2.0000 g | Freq: Four times a day (QID) | INTRAVENOUS | Status: DC

## 2017-01-17 MED ORDER — LIDOCAINE HCL (PF) 2 % IJ SOLN
INTRAMUSCULAR | Status: AC
  Filled 2017-01-17: qty 10

## 2017-01-17 MED ORDER — BUPIVACAINE-EPINEPHRINE (PF) 0.25% -1:200000 IJ SOLN
INTRAMUSCULAR | Status: AC
  Filled 2017-01-17: qty 30

## 2017-01-17 SURGICAL SUPPLY — 53 items
BASIN GRAD PLASTIC 32OZ STRL (MISCELLANEOUS) ×3 IMPLANT
BLADE SAGITTAL WIDE XTHICK NO (BLADE) ×3 IMPLANT
BLADE SURG 10 STRL SS SAFETY (BLADE) ×3 IMPLANT
BNDG COHESIVE 4X5 TAN STRL (GAUZE/BANDAGES/DRESSINGS) ×6 IMPLANT
BRUSH SCRUB EZ  4% CHG (MISCELLANEOUS) ×4
BRUSH SCRUB EZ 4% CHG (MISCELLANEOUS) ×2 IMPLANT
CAPT HIP TOTAL 2 ×3 IMPLANT
CHLORAPREP W/TINT 26ML (MISCELLANEOUS) ×3 IMPLANT
COVER TABLE BACK 60X90 (DRAPES) ×3 IMPLANT
DRAPE C-ARM 42X72 X-RAY (DRAPES) ×3 IMPLANT
DRAPE POUCH INSTRU U-SHP 10X18 (DRAPES) ×3 IMPLANT
DRAPE SHEET LG 3/4 BI-LAMINATE (DRAPES) ×3 IMPLANT
DRAPE STERI IOBAN 125X83 (DRAPES) ×3 IMPLANT
DRAPE TABLE BACK 80X90 (DRAPES) ×3 IMPLANT
DRAPE U-SHAPE 47X51 STRL (DRAPES) ×3 IMPLANT
DRSG AQUACEL AG ADV 3.5X10 (GAUZE/BANDAGES/DRESSINGS) ×3 IMPLANT
DRSG AQUACEL AG ADV 3.5X14 (GAUZE/BANDAGES/DRESSINGS) IMPLANT
ELECT BLADE 6.5 EXT (BLADE) ×3 IMPLANT
ELECT REM PT RETURN 9FT ADLT (ELECTROSURGICAL) ×3
ELECTRODE REM PT RTRN 9FT ADLT (ELECTROSURGICAL) ×1 IMPLANT
GAUZE PETRO XEROFOAM 1X8 (MISCELLANEOUS) ×3 IMPLANT
GAUZE PETROLATUM 1 X8 (GAUZE/BANDAGES/DRESSINGS) ×3 IMPLANT
GLOVE INDICATOR 8.0 STRL GRN (GLOVE) ×3 IMPLANT
GLOVE SURG ORTHO 8.0 STRL STRW (GLOVE) ×3 IMPLANT
GOWN STRL REUS W/ TWL LRG LVL3 (GOWN DISPOSABLE) ×1 IMPLANT
GOWN STRL REUS W/ TWL XL LVL3 (GOWN DISPOSABLE) ×1 IMPLANT
GOWN STRL REUS W/TWL LRG LVL3 (GOWN DISPOSABLE) ×2
GOWN STRL REUS W/TWL XL LVL3 (GOWN DISPOSABLE) ×2
HOOD W/PEELAWAY (MISCELLANEOUS) ×12 IMPLANT
IV NS 1000ML (IV SOLUTION) ×2
IV NS 1000ML BAXH (IV SOLUTION) ×1 IMPLANT
KIT PATIENT CARE HANA TABLE (KITS) ×3 IMPLANT
KIT RM TURNOVER CYSTO AR (KITS) ×3 IMPLANT
MAT BLUE FLOOR 46X72 FLO (MISCELLANEOUS) ×3 IMPLANT
NDL SAFETY 18GX1.5 (NEEDLE) ×6 IMPLANT
NEEDLE HYPO 22GX1.5 SAFETY (NEEDLE) ×3 IMPLANT
NEEDLE SPNL 20GX3.5 QUINCKE YW (NEEDLE) ×3 IMPLANT
PACK HIP PROSTHESIS (MISCELLANEOUS) ×3 IMPLANT
PADDING CAST BLEND 4X4 NS (MISCELLANEOUS) ×6 IMPLANT
PENCIL ELECTRO HAND CTR (MISCELLANEOUS) ×3 IMPLANT
PILLOW ABDUCTION MEDIUM (MISCELLANEOUS) ×3 IMPLANT
PULSAVAC PLUS IRRIG FAN TIP (DISPOSABLE) ×3
SPONGE LAP 18X18 5 PK (GAUZE/BANDAGES/DRESSINGS) ×3 IMPLANT
STAPLER PROXIMATE SKIN (STAPLE) ×3 IMPLANT
STAPLER SKIN PROX 35W (STAPLE) ×3 IMPLANT
SUT BONE WAX W31G (SUTURE) ×3 IMPLANT
SUT DVC 2 QUILL PDO  T11 36X36 (SUTURE) ×2
SUT DVC 2 QUILL PDO T11 36X36 (SUTURE) ×1 IMPLANT
SUT VIC AB 2-0 CT1 18 (SUTURE) ×3 IMPLANT
SYR 20CC LL (SYRINGE) ×3 IMPLANT
TIP FAN IRRIG PULSAVAC PLUS (DISPOSABLE) ×1 IMPLANT
TIP IRRIG/SUCT HIGH CAPACITY (MISCELLANEOUS) ×3 IMPLANT
TOWEL OR 17X26 4PK STRL BLUE (TOWEL DISPOSABLE) IMPLANT

## 2017-01-17 NOTE — NC FL2 (Signed)
[place] LEVEL OF CARE SCREENING TOOL     IDENTIFICATION  Patient Name: Jackie Macias Birthdate: 18-Aug-1955 Sex: female Admission Date (Current Location): 01/17/2017  [place] and [place] Number:  Jackie Macias  ([id number] [facility]) Facility and Address:  [facility], [address], [place], [place] [address]      Provider Number: [id number]  Attending Physician Name and Address:  Lovell Sheehan, MD  Relative Name and Phone Number:       Current Level of Care: Hospital Recommended Level of Care: [place] Prior Approval Number:    Date Approved/Denied:   PASRR Number:  [id number])  Discharge Plan: SNF    Current Diagnoses: Patient Active Problem List   Diagnosis Date Noted  . Status post THR (total hip replacement) 01/17/2017    Orientation RESPIRATION BLADDER Height & Weight     Self, Time, Situation, Place  Normal Continent Weight:   Height:     BEHAVIORAL SYMPTOMS/MOOD NEUROLOGICAL BOWEL NUTRITION STATUS      Continent Diet (Diet: Heart Healthy )  AMBULATORY STATUS COMMUNICATION OF NEEDS Skin   Extensive Assist Verbally Surgical wounds (Incision: Right Hip. )                       Personal Care Assistance Level of Assistance  Bathing, Feeding, Dressing Bathing Assistance: Limited assistance Feeding assistance: Independent Dressing Assistance: Limited assistance     Functional Limitations Info  Sight, Hearing, Speech Sight Info: Adequate Hearing Info: Adequate Speech Info: Adequate    SPECIAL CARE FACTORS FREQUENCY  PT (By licensed PT), OT (By licensed OT)     PT Frequency:  (5) OT Frequency:  (5)            Contractures      Additional Factors Info  Code Status, Allergies Code Status Info:  (Full Code. ) Allergies Info:  (No Known Allergies. )           Current Medications (01/17/2017):  This is the current hospital active medication list Current Facility-Administered Medications   Medication Dose Route Frequency Provider Last Rate Last Dose  . acetaminophen (TYLENOL) tablet 1,000 mg  1,000 mg Oral Q6H Lovell Sheehan, MD   1,000 mg at 01/17/17 1525  . [START ON 01/18/2017] amLODipine (NORVASC) tablet 10 mg  10 mg Oral Daily Lovell Sheehan, MD      . Derrill Memo ON 01/18/2017] aspirin EC tablet 325 mg  325 mg Oral Q breakfast Lovell Sheehan, MD      . bisacodyl (DULCOLAX) suppository 10 mg  10 mg Rectal Daily PRN Lovell Sheehan, MD      . ceFAZolin (ANCEF) 2 g in dextrose 5 % 100 mL IVPB  2 g Intravenous Q6H Lovell Sheehan, MD 200 mL/hr at 01/17/17 1435 2 g at 01/17/17 1435  . celecoxib (CELEBREX) capsule 200 mg  200 mg Oral Q12H Lovell Sheehan, MD   200 mg at 01/17/17 1526  . docusate sodium (COLACE) capsule 100 mg  100 mg Oral BID Lovell Sheehan, MD   100 mg at 01/17/17 1525  . famotidine (PEPCID) tablet 10 mg  10 mg Oral Daily PRN Lovell Sheehan, MD      . Derrill Memo ON 01/18/2017] furosemide (LASIX) tablet 20 mg  20 mg Oral Daily Lovell Sheehan, MD      . gabapentin (NEURONTIN) capsule 300 mg  300 mg Oral BID Lovell Sheehan, MD      .  lactated ringers infusion   Intravenous Continuous Lovell Sheehan, MD 50 mL/hr at 01/17/17 1532    . magnesium hydroxide (MILK OF MAGNESIA) suspension 30 mL  30 mL Oral Daily PRN Lovell Sheehan, MD      . menthol-cetylpyridinium (CEPACOL) lozenge 3 mg  1 lozenge Oral PRN Lovell Sheehan, MD       Or  . phenol (CHLORASEPTIC) mouth spray 1 spray  1 spray Mouth/Throat PRN Lovell Sheehan, MD      . metoCLOPramide (REGLAN) tablet 5-10 mg  5-10 mg Oral Q8H PRN Lovell Sheehan, MD       Or  . metoCLOPramide (REGLAN) injection 5-10 mg  5-10 mg Intravenous Q8H PRN Lovell Sheehan, MD      . morphine 2 MG/ML injection 2 mg  2 mg Intravenous Q2H PRN Lovell Sheehan, MD   2 mg at 01/17/17 1525  . ondansetron (ZOFRAN) tablet 4 mg  4 mg Oral Q6H PRN Lovell Sheehan, MD       Or  . ondansetron [facility]) injection 4 mg  4 mg Intravenous Q6H PRN  Lovell Sheehan, MD      . oxyCODONE (Oxy IR/ROXICODONE) immediate release tablet 10 mg  10 mg Oral Q3H PRN Lovell Sheehan, MD      . oxyCODONE (Oxy IR/ROXICODONE) immediate release tablet 5 mg  5 mg Oral Q3H PRN Lovell Sheehan, MD      . senna (SENOKOT) tablet 8.6 mg  1 tablet Oral BID Lovell Sheehan, MD   8.6 mg at 01/17/17 1526  . sodium phosphate (FLEET) 7-19 GM/118ML enema 1 enema  1 enema Rectal Once PRN Lovell Sheehan, MD         Discharge Medications: Please see discharge summary for a list of discharge medications.  Relevant Imaging Results:  Relevant Lab Results:   Additional Information  (SSN: [id number])  Jackie Macias, Jackie Beets, LCSW

## 2017-01-17 NOTE — Op Note (Signed)
01/17/2017  10:26 AM  PATIENT:  Jackie Macias   MRN: [id number]  PRE-OPERATIVE DIAGNOSIS:  Osteoarthritis right hip   POST-OPERATIVE DIAGNOSIS: Same  Procedure: Right Total Hip Replacement  Surgeon: Elyn Aquas. Harlow Mares, MD   Anesthesia: Spinal   EBL: 200 mL   Specimens: None   Drains: None   Components used: A size 3 Polarstem [employer]hew, R3 size 50 mm shell, and a 32 by -3 mm Oxinium head    Description of the procedure in detail: After informed consent was obtained and the appropriate extremity marked in the pre-operative holding area, the patient was taken to the operating room and placed in the supine position on the fracture table. All pressure points were well padded and bilateral lower extremities were place in traction spars. The hip was prepped and draped in standard sterile fashion. A spinal anesthetic had been delivered by the anesthesia team. The skin and subcutaneous tissues were injected with a mixture of Marcaine with epinephrine for post-operative pain. A longitudinal incision approximately 10 cm in length was carried out from the anterior superior iliac spine to the greater trochanter. The tensor fascia was divided and blunt dissection was taken down to the level of the joint capsule. The lateral circumflex vessels were cauterized. Deep retractors were placed and a portion of the anterior capsule was excised. Using fluoroscopy the neck cut was planned and carried out with a sagittal saw. The head was passed from the field with use of a corkscrew and hip skid. Deep retractors were placed along the acetabulum and the degenerative labrum and large osteophytes were removed with a Rongeur. The cup was sequentially reamed to a size 50 mm. The wound was irrigated and using fluoroscopy the size 50 mm cup was impacted in to anatomic position. A  threaded hole cover was placed. The final liner was impacted in to position. Attention was then turned to the proximal femur. The leg  was placed in extension and external rotation. The canal was opened and sequentially broached to a size 3. The trial components were placed and the hip relocated. The components were found to be in good position using fluoroscopy. The hip was dislocated and the trial components removed. The final components were impacted in to position and the hip relocated. The final components were again check with fluoroscopy and found to be in good position. Hemostasis was achieved with electrocautery. The deep capsule was injected with Marcaine and epinephrine. The wound was irrigated with bacitracin laced normal saline and the tensor fascia closed with #2 Quill suture. The subcutaneous tissues were closed with 2-0 vicryl and staples for the skin. A sterile dressing was applied and an abduction pillow. Patient tolerated the procedure well and there were no apparent complication. Patient was taken to the recovery room in good condition.   Kurtis Bushman, MD

## 2017-01-17 NOTE — H&P (Signed)
The patient has been re-examined, and the chart reviewed, and there have been no interval changes to the documented history and physical.  Plan a right total hip replacement today.  Anesthesia is not consulted regarding a peripheral nerve block for post-operative pain.  The risks, benefits, and alternatives have been discussed at length, and the patient is willing to proceed.     

## 2017-01-17 NOTE — Evaluation (Signed)
Physical Therapy Evaluation Patient Details Name: Jackie Macias MRN: [id number] DOB: [birth year] Today's Date: 01/17/2017   History of Present Illness  admitted for hospitalization status post R THR (01/17/17), anterior approach.  Clinical Impression  Upon evaluation, patient alert and oriented; follows all commands and demonstrates good effort with all functional activities.  R LE hip strength/ROM generally guarded due to post-op soreness, but movement and control WFL For basic transfers and mobility at at this stage in recovery.  Currently requiring min assist for bed mobility; cga/min assist for sit/stand, basic transfers and gait (15' x2) with RW.  Good progression towards reciprocal stepping pattern with fair/good stance time/weight acceptance R LE with all closed-chain activities.  Anticipate good progression towards all mobility goals. Would benefit from skilled PT to address above deficits and promote optimal return to PLOF; recommend discharge home with outpatient PT follow up as medically appropriate.    Follow Up Recommendations Outpatient PT    Equipment Recommendations  Rolling walker with 5" wheels;3in1 (PT)    Recommendations for Other Services       Precautions / Restrictions Precautions Precautions: Fall Restrictions Weight Bearing Restrictions: Yes RLE Weight Bearing: Weight bearing as tolerated      Mobility  Bed Mobility Overal bed mobility: Needs Assistance Bed Mobility: Supine to Sit     Supine to sit: Min assist     General bed mobility comments: cuing for technique, assist for management of R LE  Transfers Overall transfer level: Needs assistance Equipment used: Rolling walker (2 wheeled) Transfers: Sit to/from Stand Sit to Stand: Min guard;Min assist         General transfer comment: fair active use of R LE, minimal reports of pain with with use/WBing  Ambulation/Gait Ambulation/Gait assistance: Min guard Ambulation Distance (Feet): 15  Feet (x2) Assistive device: Rolling walker (2 wheeled)       General Gait Details: quick progression towards reciprocal stepping pattern with good WBing R LE; min cuing for walker positon/management to maximize safety  Stairs            Wheelchair Mobility    Modified Rankin (Stroke Patients Only)       Balance Overall balance assessment: Needs assistance Sitting-balance support: No upper extremity supported;Feet supported Sitting balance-Leahy Scale: Normal     Standing balance support: Bilateral upper extremity supported Standing balance-Leahy Scale: Fair                               Pertinent Vitals/Pain Pain Assessment: Faces Faces Pain Scale: Hurts little more Pain Location: R hip Pain Descriptors / Indicators: Grimacing;Operative site guarding Pain Intervention(s): Limited activity within patient's tolerance;Monitored during session;Premedicated before session;Repositioned    Home Living Family/patient expects to be discharged to:: Private residence Living Arrangements: Spouse/significant other Available Help at Discharge: Family;Available 24 hours/day Type of Home: House Home Access: Stairs to enter Entrance Stairs-Rails: Right;Left;Can reach both Entrance Stairs-Number of Steps: 4 Home Layout: Two level;Able to live on main level with bedroom/bathroom Home Equipment: Kasandra Knudsen - single point      Prior Function Level of Independence: Independent with assistive device(s)         Comments: Mod indep for ADLs, household and community mobilization; denie sfall history.  Intermittent use of SPC as needed due to R hip pain     Hand Dominance        Extremity/Trunk Assessment   Upper Extremity Assessment Upper Extremity Assessment: Overall WFL for tasks  assessed    Lower Extremity Assessment Lower Extremity Assessment:  (R hip grossly 3-/5, guarded due to post-op soreness; R knee and ankle at least 4+/5.  Sensation fully intact.  L LE  Grossly WFL)       Communication   Communication: No difficulties  Cognition Arousal/Alertness: Awake/alert Behavior During Therapy: WFL for tasks assessed/performed Overall Cognitive Status: Within Functional Limits for tasks assessed                                        General Comments      Exercises Other Exercises Other Exercises: Supine LE therex, 1x10, AROM For muscular strength/endurance: ankle pumps, quad sets, SAQs, heel slides, hip abduct.adduct.  Good R LE strength and ROM wiht isolated therex Other Exercises: Toilet transfer, ambulatory with RW, cga/close sup; min cuing for walker position and environmental awareness. Slightly impulsive.  Sit/stand from [facility] and static stance for hygiene, cga/close sup; standing balance and functional reach (at least 4-5") during hand hygiene, close sup.  Good awareness of limits of stability, though requires cuing to prevent stepping away from walker.   Assessment/Plan    PT Assessment Patient needs continued PT services  PT Problem List Decreased mobility;Decreased coordination;Decreased strength;Decreased range of motion;Decreased activity tolerance;Decreased balance;Decreased knowledge of use of DME;Decreased safety awareness;Decreased knowledge of precautions;Pain       PT Treatment Interventions DME instruction;Gait training;Stair training;Functional mobility training;Therapeutic activities;Therapeutic exercise;Balance training;Patient/family education    PT Goals (Current goals can be found in the Care Plan section)  Acute Rehab PT Goals Patient Stated Goal: to get better and return home PT Goal Formulation: With patient/family Time For Goal Achievement: 01/31/17 Potential to Achieve Goals: Good    Frequency BID   Barriers to discharge        Co-evaluation               AM-PAC PT "6 Clicks" Daily Activity  Outcome Measure Difficulty turning over in bed (including adjusting bedclothes, sheets and  blankets)?: Unable Difficulty moving from lying on back to sitting on the side of the bed? : Unable Difficulty sitting down on and standing up from a chair with arms (e.g., wheelchair, bedside commode, etc,.)?: Unable Help needed moving to and from a bed to chair (including a wheelchair)?: A Little Help needed walking in hospital room?: A Little Help needed climbing 3-5 steps with a railing? : A Little 6 Click Score: 12    End of Session Equipment Utilized During Treatment: Gait belt Activity Tolerance: Patient tolerated treatment well Patient left: in chair;with chair alarm set;with call bell/phone within reach;with family/visitor present Nurse Communication: Mobility status PT Visit Diagnosis: Muscle weakness (generalized) (M62.81);Difficulty in walking, not elsewhere classified (R26.2);Pain Pain - Right/Left: Right Pain - part of body: Hip    Time: 1625-1700 PT Time Calculation (min) (ACUTE ONLY): 35 min   Charges:   PT Evaluation $PT Eval Low Complexity: 1 Low PT Treatments $Therapeutic Exercise: 8-22 mins $Therapeutic Activity: 8-22 mins   PT G Codes:   PT G-Codes **NOT FOR INPATIENT CLASS** Functional Assessment Tool Used: AM-PAC 6 Clicks Basic Mobility Functional Limitation: Mobility: Walking and moving around Mobility: Walking and Moving Around Current Status (N6295): At least 20 percent but less than 40 percent impaired, limited or restricted Mobility: Walking and Moving Around Goal Status [phone]): At least 1 percent but less than 20 percent impaired, limited or restricted  Ian Castagna H. Owens Shark, PT, DPT, NCS 01/17/17, 10:41 PM [phone]

## 2017-01-17 NOTE — Anesthesia Procedure Notes (Signed)
Date/Time: 01/17/2017 8:08 AM Performed by: Johnna Acosta Pre-anesthesia Checklist: Patient identified, Emergency Drugs available, Suction available, Patient being monitored and Timeout performed Patient Re-evaluated:Patient Re-evaluated prior to induction Oxygen Delivery Method: Simple face mask

## 2017-01-17 NOTE — Transfer of Care (Signed)
Immediate Anesthesia Transfer of Care Note  Patient: Jackie Macias  Procedure(s) Performed: TOTAL HIP ARTHROPLASTY ANTERIOR APPROACH (Right Hip)  Patient Location: PACU  Anesthesia Type:Spinal  Level of Consciousness: sedated  Airway & Oxygen Therapy: Patient Spontanous Breathing and Patient connected to face mask oxygen  Post-op Assessment: Report given to RN and Post -op Vital signs reviewed and stable  Post vital signs: Reviewed and stable  Last Vitals:  Vitals:   01/17/17 0720  BP: (!) 168/76  Pulse: 78  Resp: 20  Temp: 36.7 C  SpO2: 100%    Last Pain:  Vitals:   01/17/17 0720  TempSrc: Tympanic         Complications: No apparent anesthesia complications

## 2017-01-17 NOTE — Anesthesia Preprocedure Evaluation (Signed)
Anesthesia Evaluation  Patient identified by MRN, date of birth, ID band Patient awake    Reviewed: Allergy & Precautions, NPO status , Patient's Chart, lab work & pertinent test results  History of Anesthesia Complications Negative for: history of anesthetic complications  Airway Mallampati: II  TM Distance: >3 FB Neck ROM: Full    Dental  (+) Edentulous Upper, Missing   Pulmonary neg sleep apnea, COPD,  COPD inhaler, Current Smoker,    breath sounds clear to auscultation- rhonchi (-) wheezing      Cardiovascular hypertension, Pt. on medications (-) CAD, (-) Past MI and (-) Cardiac Stents  Rhythm:Regular Rate:Normal - Systolic murmurs and - Diastolic murmurs    Neuro/Psych negative neurological ROS  negative psych ROS   GI/Hepatic Neg liver ROS, GERD  ,  Endo/Other  negative endocrine ROSneg diabetes  Renal/GU negative Renal ROS     Musculoskeletal  (+) Arthritis ,   Abdominal (+) + obese,   Peds  Hematology negative hematology ROS (+)   Anesthesia Other Findings Past Medical History: No date: Arthritis No date: Bronchitis No date: COPD (chronic obstructive pulmonary disease) (HCC) No date: GERD (gastroesophageal reflux disease) No date: Gout No date: Hypertension   Reproductive/Obstetrics                             Anesthesia Physical Anesthesia Plan  ASA: II  Anesthesia Plan: Spinal   Post-op Pain Management:    Induction:   PONV Risk Score and Plan: 1 and Propofol infusion and Ondansetron  Airway Management Planned: Natural Airway  Additional Equipment:   Intra-op Plan:   Post-operative Plan:   Informed Consent: I have reviewed the patients History and Physical, chart, labs and discussed the procedure including the risks, benefits and alternatives for the proposed anesthesia with the patient or authorized representative who has indicated his/her understanding and  acceptance.   Dental advisory given  Plan Discussed with: Anesthesiologist and CRNA  Anesthesia Plan Comments:         Anesthesia Quick Evaluation

## 2017-01-17 NOTE — Anesthesia Post-op Follow-up Note (Signed)
Anesthesia QCDR form completed.        

## 2017-01-18 LAB — BASIC METABOLIC PANEL
ANION GAP: 7 (ref 5–15)
BUN: 22 mg/dL — ABNORMAL HIGH (ref 6–20)
CO2: 24 mmol/L (ref 22–32)
Calcium: 8.8 mg/dL — ABNORMAL LOW (ref 8.9–10.3)
Chloride: 102 mmol/L (ref 101–111)
Creatinine, Ser: 0.9 mg/dL (ref 0.44–1.00)
Glucose, Bld: 175 mg/dL — ABNORMAL HIGH (ref 65–99)
POTASSIUM: 3.8 mmol/L (ref 3.5–5.1)
SODIUM: 133 mmol/L — AB (ref 135–145)

## 2017-01-18 LAB — CBC
HCT: 34.7 % — ABNORMAL LOW (ref 35.0–47.0)
Hemoglobin: 11.5 g/dL — ABNORMAL LOW (ref 12.0–16.0)
MCH: 33.2 pg (ref 26.0–34.0)
MCHC: 33.1 g/dL (ref 32.0–36.0)
MCV: 100.2 fL — ABNORMAL HIGH (ref 80.0–100.0)
PLATELETS: 291 10*3/uL (ref 150–440)
RBC: 3.47 MIL/uL — AB (ref 3.80–5.20)
RDW: 14.6 % — ABNORMAL HIGH (ref 11.5–14.5)
WBC: 12.3 10*3/uL — AB (ref 3.6–11.0)

## 2017-01-18 MED ORDER — OXYCODONE HCL 5 MG PO TABS: 5.0000 mg | ORAL_TABLET | ORAL | 0 refills | Status: DC | PRN

## 2017-01-18 MED ORDER — ASPIRIN 325 MG PO TBEC: 325.0000 mg | DELAYED_RELEASE_TABLET | Freq: Every day | ORAL | 0 refills | Status: DC

## 2017-01-18 MED ORDER — SENNA 8.6 MG PO TABS: 1.0000 | ORAL_TABLET | Freq: Two times a day (BID) | ORAL | 0 refills | Status: DC

## 2017-01-18 MED ORDER — DOCUSATE SODIUM 100 MG PO CAPS: 100.0000 mg | ORAL_CAPSULE | Freq: Two times a day (BID) | ORAL | 0 refills | Status: DC

## 2017-01-18 NOTE — Discharge Instructions (Signed)
Keep bandage in place for 1 week, then remove and may leave open to air. No tight waistbands. OK to shower, no tub bath. Outpatient therapy.  Total Hip Replacement, Care After Refer to this sheet in the next few weeks. These instructions provide you with information on caring for yourself after your procedure. Your health care provider may also give you specific instructions. Your treatment has been planned according to the most current medical practices, but problems sometimes occur. Call your health care provider if you have any problems or questions after your procedure. Follow these instructions at home: Your health care provider will give you specific precautions for certain types of movement. Additional instructions include:  Take medicines only as directed by your health care provider.  Do not take baths, swim, or use a hot tub until your health care provider approves.  Avoid lifting until your health care provider instructs you otherwise.  Use a raised toilet seat and avoid sitting in low chairs as instructed by your health care provider.  Use crutches or a walker as instructed by your health care provider.  Rest often, but move around as much as you can tolerate. Movement helps you to heal and helps to prevent stiffness, skin sores, and blood clots.  Wear compression stockings as told by your health care provider. These stockings help to prevent blood clots and to reduce swelling in your legs.  Follow instructions from your health care provider about how to take care of your incision. Make sure you: ? Wash your hands with soap and water before you change your bandage (dressing). If soap and water are not available, use hand sanitizer. ? Change your dressing as told by your health care provider. ? Leave stitches (sutures), skin glue, or adhesive strips in place. These skin closures may need to be in place for 2 weeks or longer. If adhesive strip edges start to loosen and curl up,  you may trim the loose edges. Do not remove adhesive strips completely unless your health care provider tells you to do that.  Contact a health care provider if:  You have difficulty breathing.  You have drainage, redness, or swelling at your incision site.  You have a bad smell coming from your incision site.  You have persistent bleeding from your incision site.  Your incision breaks open after sutures (stitches) or staples have been removed.  You have a fever. Get help right away if:  You have a rash.  You have pain or swelling in your calf or thigh.  You have shortness of breath or chest pain. This information is not intended to replace advice given to you by your health care provider. Make sure you discuss any questions you have with your health care provider. Document Released: 10/02/2004 Document Revised: 11/17/2015 Document Reviewed: 05/16/2013 Elsevier Interactive Patient Education  2017 [employer].

## 2017-01-18 NOTE — Discharge Summary (Signed)
Physician Discharge Summary  Patient ID: Jackie Macias MRN: [id number] DOB/AGE: [birth year] 62 y.o.  Admit date: 01/17/2017 Discharge date: 01/18/2017  Admission Diagnoses:  M16.11 Unilateral primary osteoarthritis, right hip <principal problem not specified>  Discharge Diagnoses:  M16.11 Unilateral primary osteoarthritis, right hip Active Problems:   Status post THR (total hip replacement)   Past Medical History:  Diagnosis Date  . Arthritis   . Bronchitis   . COPD (chronic obstructive pulmonary disease) ([place])   . GERD (gastroesophageal reflux disease)   . Gout   . Hypertension     Surgeries: Procedure(s): TOTAL HIP ARTHROPLASTY ANTERIOR APPROACH on 01/17/2017   Consultants (if any):   Discharged Condition: Improved  Hospital Course: Jackie Macias is an 61 y.o. female who was admitted 01/17/2017 with a diagnosis of  M16.11 Unilateral primary osteoarthritis, right hip <principal problem not specified> and went to the operating room on 01/17/2017 and underwent the above named procedures.    She was given perioperative antibiotics:  Anti-infectives    Start     Dose/Rate Route Frequency Ordered Stop   01/17/17 1400  ceFAZolin (ANCEF) 2 g in dextrose 5 % 100 mL IVPB     2 g 200 mL/hr over 30 Minutes Intravenous Every 6 hours 01/17/17 1212 01/17/17 2118   01/17/17 1215  ceFAZolin (ANCEF) IVPB 2g/100 mL premix  Status:  Discontinued     2 g 200 mL/hr over 30 Minutes Intravenous Every 6 hours 01/17/17 1208 01/17/17 1212   01/17/17 1024  50,000 units bacitracin in 0.9% normal saline 250 mL irrigation  Status:  Discontinued       As needed 01/17/17 1024 01/17/17 1039   01/17/17 0023  ceFAZolin (ANCEF) IVPB 2g/100 mL premix     2 g 200 mL/hr over 30 Minutes Intravenous On call to O.R. 01/17/17 0023 01/17/17 0845    .  She was given sequential compression devices, early ambulation, and ECASA for DVT prophylaxis.  She benefited maximally from the hospital stay and there  were no complications.    Recent vital signs:  Vitals:   01/18/17 0454 01/18/17 0743  BP: 123/61 (!) 135/57  Pulse: 69 65  Resp: 19   Temp: 98.4 F (36.9 C) (!) 97.5 F (36.4 C)  SpO2: 96% 97%    Recent laboratory studies:  Lab Results  Component Value Date   HGB 11.5 (L) 01/18/2017   HGB 12.8 01/05/2017   HGB 12.4 02/03/2016   Lab Results  Component Value Date   WBC 12.3 (H) 01/18/2017   PLT 291 01/18/2017   Lab Results  Component Value Date   INR 1.04 01/05/2017   Lab Results  Component Value Date   NA 133 (L) 01/18/2017   K 3.8 01/18/2017   CL 102 01/18/2017   CO2 24 01/18/2017   BUN 22 (H) 01/18/2017   CREATININE 0.90 01/18/2017   GLUCOSE 175 (H) 01/18/2017    Discharge Medications:   Allergies as of 01/18/2017   No Known Allergies     Medication List    STOP taking these medications   ibuprofen 800 MG tablet Commonly known as:  ADVIL,MOTRIN     TAKE these medications   amLODipine 10 MG tablet Commonly known as:  NORVASC Take 10 mg by mouth daily.   aspirin 325 MG EC tablet Take 1 tablet (325 mg total) by mouth daily with breakfast.   docusate sodium 100 MG capsule Commonly known as:  COLACE Take 1 capsule (100 mg total) by mouth 2 (  two) times daily.   furosemide 20 MG tablet Commonly known as:  LASIX Take 20 mg by mouth daily.   gabapentin 300 MG capsule Commonly known as:  NEURONTIN Take 300 mg by mouth 2 (two) times daily.   naproxen 500 MG tablet Commonly known as:  NAPROSYN Take 1 tablet (500 mg total) by mouth 2 (two) times daily with a meal.   oxyCODONE 5 MG immediate release tablet Commonly known as:  Oxy IR/ROXICODONE Take 1 tablet (5 mg total) by mouth every 3 (three) hours as needed for moderate pain ((score 4 to 6)).   ranitidine 150 MG tablet Commonly known as:  ZANTAC Take 150 mg by mouth 2 (two) times daily.   senna 8.6 MG Tabs tablet Commonly known as:  SENOKOT Take 1 tablet (8.6 mg total) by mouth 2 (two)  times daily.            Durable Medical Equipment        Start     Ordered   01/18/17 [phone]  For home use only DME Walker rolling  Once    Question:  Patient needs a walker to treat with the following condition  Answer:  Status post THR (total hip replacement)   01/18/17 0837   01/18/17 0000  DME Bedside commode    Question:  Patient needs a bedside commode to treat with the following condition  Answer:  Status post THR (total hip replacement)   01/18/17 0837   01/17/17 1209  DME Walker rolling  Once    Question:  Patient needs a walker to treat with the following condition  Answer:  Status post THR (total hip replacement)   01/17/17 1208   01/17/17 1209  DME Bedside commode  Once    Question:  Patient needs a bedside commode to treat with the following condition  Answer:  Status post THR (total hip replacement)   01/17/17 1208      Diagnostic Studies: Dg Pelvis Portable  Result Date: 01/17/2017 CLINICAL DATA:  Post hip replacement EXAM: PORTABLE PELVIS 1-2 VIEWS COMPARISON:  Portable exam 1052 hours compared to 12/04/2012 FINDINGS: RIGHT hip prosthesis identified in expected position on single AP view. No fracture or dislocation. Visualized pelvis intact. Narrowing of LEFT hip joint. IMPRESSION: RIGHT hip prosthesis without acute complication. Mild degenerative changes LEFT hip joint. Electronically Signed   By: Lavonia Dana M.D.   On: 01/17/2017 11:19   Dg Hip Operative Unilat W Or W/o Pelvis Right  Result Date: 01/17/2017 CLINICAL DATA:  Right total hip replacement EXAM: OPERATIVE RIGHT HIP (WITH PELVIS IF PERFORMED) 4 VIEWS TECHNIQUE: Fluoroscopic spot image(s) were submitted for interpretation post-operatively. COMPARISON:  11/17/2015 FINDINGS: Changes of right hip replacement.  No visible complicating feature. IMPRESSION: Right hip replacement.  No visible complicating feature. Electronically Signed   By: Rolm Baptise M.D.   On: 01/17/2017 10:14    Disposition: 07-Left  Against Medical Advice/Left Without Being Seen/Elopement  Discharge Instructions    DME Bedside commode    Complete by:  As directed    Patient needs a bedside commode to treat with the following condition:  Status post THR (total hip replacement)         Signed: Lovell Sheehan ,MD 01/18/2017, 8:40 AM

## 2017-01-18 NOTE — Care Management Note (Signed)
Case Management Note  Patient Details  Name: Jackie Macias MRN: [id number] Date of Birth: Aug 18, 1955  Subjective/Objective: Met with patient at bedside. Discussed discharge planning. Appointment scheduled with [facility] outpatient physical therapy for Wednesday Oct. 24 @ 1:00 pm. Patient updated. Placed on follow up appointment list on discharge instructions. Ordered walker and bsc from [facility] to be delivered prior to discharge. Will go home on ASA. Patient lives with her spouse. She is ambulatory and independent at baseline. Uses [employer] for transportation and friends.                    Action/Plan: OP PT at [facility], Wednesday at 1 pm. Gilford Rile and [facility] from [facility].   Expected Discharge Date:  01/18/17               Expected Discharge Plan:  Home/Self Care  In-House Referral:     Discharge planning Services  CM Consult  Post Acute Care Choice:  Durable Medical Equipment Choice offered to:  Patient  DME Arranged:  Gilford Rile rolling, Bedside commode DME Agency:  [place]:    [facility] Agency:     Status of Service:  Completed, signed off  If discussed at [place] of Stay Meetings, dates discussed:    Additional Comments:  Jolly Mango, RN 01/18/2017, 8:59 AM

## 2017-01-18 NOTE — Progress Notes (Signed)
  Subjective:  Patient reports pain as moderate.  No other complaints.  Objective:   VITALS:   Vitals:   01/17/17 2013 01/17/17 2348 01/18/17 0454 01/18/17 0743  BP: 130/82 118/61 123/61 (!) 135/57  Pulse: 64 61 69 65  Resp: 19 19 19    Temp: 97.7 F (36.5 C) 98.4 F (36.9 C) 98.4 F (36.9 C) (!) 97.5 F (36.4 C)  TempSrc: Oral Oral Oral Oral  SpO2: 97% 97% 96% 97%  Weight:      Height:        PHYSICAL EXAM:  Sensation intact distally Intact pulses distally Dorsiflexion/Plantar flexion intact Incision: dressing C/D/I  LABS  Results for orders placed or performed during the hospital encounter of 01/17/17 (from the past 24 hour(s))  CBC     Status: Abnormal   Collection Time: 01/18/17  5:11 AM  Result Value Ref Range   WBC 12.3 (H) 3.6 - 11.0 K/uL   RBC 3.47 (L) 3.80 - 5.20 MIL/uL   Hemoglobin 11.5 (L) 12.0 - 16.0 g/dL   HCT 34.7 (L) 35.0 - 47.0 %   MCV 100.2 (H) 80.0 - 100.0 fL   MCH 33.2 26.0 - 34.0 pg   MCHC 33.1 32.0 - 36.0 g/dL   RDW 14.6 (H) 11.5 - 14.5 %   Platelets 291 150 - 440 K/uL  Basic metabolic panel     Status: Abnormal   Collection Time: 01/18/17  5:11 AM  Result Value Ref Range   Sodium 133 (L) 135 - 145 mmol/L   Potassium 3.8 3.5 - 5.1 mmol/L   Chloride 102 101 - 111 mmol/L   CO2 24 22 - 32 mmol/L   Glucose, Bld 175 (H) 65 - 99 mg/dL   BUN 22 (H) 6 - 20 mg/dL   Creatinine, Ser 0.90 0.44 - 1.00 mg/dL   Calcium 8.8 (L) 8.9 - 10.3 mg/dL   GFR calc non Af Amer >60 >60 mL/min   GFR calc Af Amer >60 >60 mL/min   Anion gap 7 5 - 15    Dg Pelvis Portable  Result Date: 01/17/2017 CLINICAL DATA:  Post hip replacement EXAM: PORTABLE PELVIS 1-2 VIEWS COMPARISON:  Portable exam 1052 hours compared to 12/04/2012 FINDINGS: RIGHT hip prosthesis identified in expected position on single AP view. No fracture or dislocation. Visualized pelvis intact. Narrowing of LEFT hip joint. IMPRESSION: RIGHT hip prosthesis without acute complication. Mild degenerative  changes LEFT hip joint. Electronically Signed   By: Lavonia Dana M.D.   On: 01/17/2017 11:19   Dg Hip Operative Unilat W Or W/o Pelvis Right  Result Date: 01/17/2017 CLINICAL DATA:  Right total hip replacement EXAM: OPERATIVE RIGHT HIP (WITH PELVIS IF PERFORMED) 4 VIEWS TECHNIQUE: Fluoroscopic spot image(s) were submitted for interpretation post-operatively. COMPARISON:  11/17/2015 FINDINGS: Changes of right hip replacement.  No visible complicating feature. IMPRESSION: Right hip replacement.  No visible complicating feature. Electronically Signed   By: Rolm Baptise M.D.   On: 01/17/2017 10:14    Assessment/Plan: 1 Day Post-Op   Active Problems:   Status post THR (total hip replacement)   Advance diet Up with therapy D/C IV fluids Discharge home with outpatient physical therapy if cleared by PT today.  Lovell Sheehan , MD 01/18/2017, 8:24 AM

## 2017-01-18 NOTE — Progress Notes (Signed)
Physical Therapy Treatment Patient Details Name: Jackie Macias MRN: [id number] DOB: [birth year] Today's Date: 01/18/2017    History of Present Illness admitted for hospitalization status post R THR (01/17/17), anterior approach.    PT Comments    Pt was able to progress treatment today by increasing ambulation distance and therapeutic exercises.  Pt required VC's from PT for safety awareness but was able to perform transfers and bed mobility with modified independence.  She demonstrated ability to perform stair mobility using bilateral handrails.  Pt reported a mild pain increase during ambulation but did not demonstrate an understanding of the pain scale.  Pt expressed understanding of precautions as related to car transfers and transfers in and out of bed.    Follow Up Recommendations  Outpatient     Equipment Recommendations       Recommendations for Other Services       Precautions / Restrictions Precautions Precautions: Fall;Anterior Hip Restrictions Weight Bearing Restrictions: Yes RLE Weight Bearing: Weight bearing as tolerated    Mobility  Bed Mobility Overal bed mobility: Independent Bed Mobility: Rolling;Sidelying to Sit;Supine to Sit Rolling: Independent Sidelying to sit: Independent Supine to sit: Independent     General bed mobility comments: Pt did not require VC's for bed mobility and was able to perform in a timely manner.  Transfers Overall transfer level: Modified independent Equipment used: Rolling walker (2 wheeled) Transfers: Sit to/from Stand Sit to Stand: Modified independent (Device/Increase time)         General transfer comment: Pt is able to stand independently using proper technique though she sometimes needs cueing regarding impulsivity for safety purpoeses.  Ambulation/Gait Ambulation/Gait assistance: Modified independent (Device/Increase time) Ambulation Distance (Feet): 350 Feet Assistive device: Rolling walker (2 wheeled) Gait  Pattern/deviations: Step-through pattern;Decreased stance time - left Gait velocity: 1.5 ft/sec Gait velocity interpretation: at or above normal speed for age/gender General Gait Details: Pt demonstrates trunk lean to the L and antalgic gait   Stairs Stairs: Yes   Stair Management: Two rails Number of Stairs: 5 General stair comments: Pt uses a step to gait pattern and bilateral handrails.  PT provided VC's for ascent with unaffected side and descent with affected side of which pt demonstrated understanding.  Wheelchair Mobility    Modified Rankin (Stroke Patients Only)       Balance Overall balance assessment: Modified Independent Sitting-balance support: No upper extremity supported                                        Cognition Arousal/Alertness: Awake/alert Behavior During Therapy: WFL for tasks assessed/performed Overall Cognitive Status: Within Functional Limits for tasks assessed                                        Exercises Total Joint Exercises Ankle Circles/Pumps: AROM;Both;10 reps;Seated Quad Sets: Right;10 reps;Supine;Strengthening Gluteal Sets: Strengthening;Both;10 reps;Seated Towel Squeeze: Strengthening;Both;10 reps;Supine Short Arc Quad: Strengthening;10 reps;Both;Seated Heel Slides: Strengthening;Right;10 reps;Supine    General Comments        Pertinent Vitals/Pain Pain Assessment: 0-10 Pain Score: 7  Pain Location: R hip.  States that her pain is a 7/10 which is "not too bad".  PT educated pt concerning pain scale. Pain Descriptors / Indicators: Aching Pain Intervention(s): Monitored during session    Home Living  Prior Function            PT Goals (current goals can now be found in the care plan section) Progress towards PT goals: Progressing toward goals    Frequency    BID      PT Plan Current plan remains appropriate    Co-evaluation               AM-PAC PT "6 Clicks" Daily Activity  Outcome Measure  Difficulty turning over in bed (including adjusting bedclothes, sheets and blankets)?: None Difficulty moving from lying on back to sitting on the side of the bed? : None Difficulty sitting down on and standing up from a chair with arms (e.g., wheelchair, bedside commode, etc,.)?: None Help needed moving to and from a bed to chair (including a wheelchair)?: None Help needed walking in hospital room?: None Help needed climbing 3-5 steps with a railing? : A Little 6 Click Score: 23    End of Session Equipment Utilized During Treatment: Gait belt Activity Tolerance: Patient tolerated treatment well Patient left: in bed;with call bell/phone within reach Nurse Communication: Other (comment) (Pt refused bed alarm, status of IV.) PT Visit Diagnosis: Other abnormalities of gait and mobility (R26.89);Muscle weakness (generalized) (M62.81);Pain Pain - Right/Left: Right Pain - part of body: Hip     Time: 9470-9628 PT Time Calculation (min) (ACUTE ONLY): 40 min  Charges:  $Gait Training: 8-22 mins $Therapeutic Exercise: 8-22 mins $Therapeutic Activity: 8-22 mins                    G Codes:       Roxanne Gates, PT, DPT   Roxanne Gates 01/18/2017, 1:56 PM

## 2017-01-18 NOTE — Progress Notes (Signed)
Clinical Social Worker (CSW) received SNF consult. PT is recommending outpatient PT. RN case manager aware of above. Please reconsult if future social work needs arise. CSW signing off.   Abuk Selleck, LCSW [phone]  

## 2017-01-18 NOTE — Anesthesia Postprocedure Evaluation (Signed)
Anesthesia Post Note  Patient: FREDRICA CAPANO  Procedure(s) Performed: TOTAL HIP ARTHROPLASTY ANTERIOR APPROACH (Right Hip)  Patient location during evaluation: Nursing Unit Anesthesia Type: Spinal Level of consciousness: oriented and awake and alert Pain management: satisfactory to patient Vital Signs Assessment: post-procedure vital signs reviewed and stable Respiratory status: respiratory function stable Cardiovascular status: stable Postop Assessment: no headache, no backache, spinal receding, patient able to bend at knees, no apparent nausea or vomiting and adequate PO intake Anesthetic complications: no     Last Vitals:  Vitals:   01/17/17 2348 01/18/17 0454  BP: 118/61 123/61  Pulse: 61 69  Resp: 19 19  Temp: 36.9 C 36.9 C  SpO2: 97% 96%    Last Pain:  Vitals:   01/18/17 0550  TempSrc:   PainSc: 3                  Blima Singer

## 2017-01-19 LAB — SURGICAL PATHOLOGY

## 2017-03-08 ENCOUNTER — Ambulatory Visit: Payer: Medicare Other | Admitting: Cardiovascular Disease

## 2017-04-12 ENCOUNTER — Encounter: Payer: Self-pay | Admitting: *Deleted

## 2017-04-12 ENCOUNTER — Other Ambulatory Visit: Payer: Self-pay

## 2017-04-12 ENCOUNTER — Emergency Department
Admission: EM | Admit: 2017-04-12 | Discharge: 2017-04-12 | Disposition: A | Discharge status: Home / Self Care | Payer: Medicare Other | Attending: Emergency Medicine | Admitting: Emergency Medicine

## 2017-04-12 DIAGNOSIS — Z7982 Long term (current) use of aspirin: Secondary | ICD-10-CM | POA: Insufficient documentation

## 2017-04-12 DIAGNOSIS — Z79899 Other long term (current) drug therapy: Secondary | ICD-10-CM | POA: Diagnosis not present

## 2017-04-12 DIAGNOSIS — Z87891 Personal history of nicotine dependence: Secondary | ICD-10-CM | POA: Insufficient documentation

## 2017-04-12 DIAGNOSIS — I10 Essential (primary) hypertension: Secondary | ICD-10-CM | POA: Diagnosis not present

## 2017-04-12 DIAGNOSIS — J449 Chronic obstructive pulmonary disease, unspecified: Secondary | ICD-10-CM | POA: Diagnosis not present

## 2017-04-12 DIAGNOSIS — L089 Local infection of the skin and subcutaneous tissue, unspecified: Secondary | ICD-10-CM | POA: Diagnosis not present

## 2017-04-12 DIAGNOSIS — L03039 Cellulitis of unspecified toe: Secondary | ICD-10-CM

## 2017-04-12 DIAGNOSIS — M79674 Pain in right toe(s): Secondary | ICD-10-CM | POA: Diagnosis present

## 2017-04-12 MED ORDER — MELOXICAM 15 MG PO TABS: 15.0000 mg | ORAL_TABLET | Freq: Every day | ORAL | 0 refills | Status: AC

## 2017-04-12 MED ORDER — CLINDAMYCIN HCL 300 MG PO CAPS: 300.0000 mg | ORAL_CAPSULE | Freq: Three times a day (TID) | ORAL | 0 refills | Status: AC

## 2017-04-12 NOTE — ED Provider Notes (Signed)
[facility] Emergency Department Provider Note  ____________________________________________  Time seen: Approximately 3:25 PM  I have reviewed the triage vital signs and the nursing notes.   HISTORY  Chief Complaint Toe Pain    HPI Jackie Macias is a 62 y.o. female that presents to the emergency department for evaluation of toenail pain and drainage for 1 week.  This has never happened before.  No injury.  Patient is not diabetic.  No fever, chills.   Past Medical History:  Diagnosis Date  . Arthritis   . Bronchitis   . COPD (chronic obstructive pulmonary disease) ([place])   . GERD (gastroesophageal reflux disease)   . Gout   . Hypertension     Patient Active Problem List   Diagnosis Date Noted  . Status post THR (total hip replacement) 01/17/2017    Past Surgical History:  Procedure Laterality Date  . CHOLECYSTECTOMY    . facial biopsy Right   . TONSILLECTOMY    . TOTAL HIP ARTHROPLASTY Right 01/17/2017   Procedure: TOTAL HIP ARTHROPLASTY ANTERIOR APPROACH;  Surgeon: Lovell Sheehan, MD;  Location: [facility] ORS;  Service: Orthopedics;  Laterality: Right;    Prior to Admission medications   Medication Sig Start Date End Date Taking? Authorizing Provider  amLODipine (NORVASC) 10 MG tablet Take 10 mg by mouth daily.    [provider]  aspirin EC 325 MG EC tablet Take 1 tablet (325 mg total) by mouth daily with breakfast. 01/18/17   Lovell Sheehan, MD  clindamycin (CLEOCIN) 300 MG capsule Take 1 capsule (300 mg total) by mouth 3 (three) times daily for 10 days. 04/12/17 04/22/17  Laban Emperor, PA-C  docusate sodium (COLACE) 100 MG capsule Take 1 capsule (100 mg total) by mouth 2 (two) times daily. 01/18/17   Lovell Sheehan, MD  furosemide (LASIX) 20 MG tablet Take 20 mg by mouth daily.    [provider]  gabapentin (NEURONTIN) 300 MG capsule Take 300 mg by mouth 2 (two) times daily.    [provider]  meloxicam (MOBIC)  15 MG tablet Take 1 tablet (15 mg total) by mouth daily for 10 days. 04/12/17 04/22/17  Laban Emperor, PA-C  naproxen (NAPROSYN) 500 MG tablet Take 1 tablet (500 mg total) by mouth 2 (two) times daily with a meal. 11/17/15   Beers, Pierce Crane, PA-C  oxyCODONE (OXY IR/ROXICODONE) 5 MG immediate release tablet Take 1 tablet (5 mg total) by mouth every 3 (three) hours as needed for moderate pain ((score 4 to 6)). 01/18/17   Lovell Sheehan, MD  ranitidine (ZANTAC) 150 MG tablet Take 150 mg by mouth 2 (two) times daily.    [provider]  senna (SENOKOT) 8.6 MG TABS tablet Take 1 tablet (8.6 mg total) by mouth 2 (two) times daily. 01/18/17   Lovell Sheehan, MD    Allergies Patient has no known allergies.  Family History  Problem Relation Age of Onset  . Breast cancer Sister 20  . Breast cancer Other     Social History Social History   Tobacco Use  . Smoking status: Current Every Day Smoker    Packs/day: 0.50  . Smokeless tobacco: Former Network engineer Use Topics  . Alcohol use: Yes    Alcohol/week: 1.2 oz    Types: 2 Cans of beer per week    Comment: occasional  . Drug use: No     Review of Systems  Constitutional: No fever/chills Cardiovascular: No chest pain.  Respiratory: No SOB. Gastrointestinal: No nausea, no vomiting.  Musculoskeletal: Positive for toe pain. Skin: Negative for rash, abrasions, lacerations, ecchymosis.   ____________________________________________   PHYSICAL EXAM:  VITAL SIGNS: ED Triage Vitals  Enc Vitals Group     BP 04/12/17 1352 (!) 142/69     Pulse Rate 04/12/17 1352 84     Resp 04/12/17 1352 16     Temp 04/12/17 1352 99.1 F (37.3 C)     Temp Source 04/12/17 1352 Oral     SpO2 04/12/17 1352 99 %     Weight 04/12/17 1353 215 lb (97.5 kg)     Height 04/12/17 1353 5\' 6"  (1.676 m)     Head Circumference --      Peak Flow --      Pain Score 04/12/17 1356 8     Pain Loc --      Pain Edu? --      Excl. in [place]? --       Constitutional: Alert and oriented. Well appearing and in no acute distress. Eyes: Conjunctivae are normal. PERRL. EOMI. Head: Atraumatic. ENT:      Ears:      Nose: No congestion/rhinnorhea.      Mouth/Throat: Mucous membranes are moist.  Neck: No stridor.  Cardiovascular: Normal rate, regular rhythm.  Good peripheral circulation. Respiratory: Normal respiratory effort without tachypnea or retractions. Lungs CTAB. Good air entry to the bases with no decreased or absent breath sounds. Musculoskeletal: Full range of motion to all extremities. No gross deformities appreciated.  No tenderness to palpation around right great toenail.  No tenderness to palpation to plantar side of great toe.  Yellow drainage oozing from under toenail. Neurologic:  Normal speech and language. No gross focal neurologic deficits are appreciated.  Skin:  Skin is warm, dry and intact.    ____________________________________________   LABS (all labs ordered are listed, but only abnormal results are displayed)  Labs Reviewed - No data to display ____________________________________________  EKG   ____________________________________________  RADIOLOGY toenail infection  No results found.  ____________________________________________    PROCEDURES  Procedure(s) performed:    Procedures    Medications - No data to display   ____________________________________________   INITIAL IMPRESSION / ASSESSMENT AND PLAN / ED COURSE  Pertinent labs & imaging results that were available during my care of the patient were reviewed by me and considered in my medical decision making (see chart for details).  Review of the [place] CSRS was performed in accordance of the [place] prior to dispensing any controlled drugs.  Patient's diagnosis is consistent with toenail infection. Vital signs and exam are reassuring. Patient will be discharged home with prescriptions for clindamycin. Patient is to follow up  with podiatry as directed. Patient is given ED precautions to return to the ED for any worsening or new symptoms.     ____________________________________________  FINAL CLINICAL IMPRESSION(S) / ED DIAGNOSES  Final diagnoses:  Infection of toenail      NEW MEDICATIONS STARTED DURING THIS VISIT:  ED Discharge Orders        Ordered    clindamycin (CLEOCIN) 300 MG capsule  3 times daily     04/12/17 1537    meloxicam (MOBIC) 15 MG tablet  Daily     04/12/17 1556          This chart was dictated using voice recognition software/Dragon. Despite best efforts to proofread, errors can occur which can change the meaning. Any change was purely unintentional.  Laban Emperor, PA-C 04/12/17 [place], Randall An, MD 04/13/17 [id number]

## 2017-04-12 NOTE — ED Notes (Signed)
See triage note  Presents with pain and drainage to right great toe  Unsure of ingrown nail

## 2017-04-12 NOTE — ED Triage Notes (Signed)
Pt to ED reporting pain in right foot big toe with drainage for the past week. Toes appears to be infected around nail bed. Pt denies having had in grown toe nails in the past. No swelling noted to rest of the foot.

## 2017-06-29 ENCOUNTER — Other Ambulatory Visit: Payer: Self-pay | Admitting: Podiatry

## 2017-06-29 ENCOUNTER — Inpatient Hospital Stay: Admission: RE | Admit: 2017-06-29 | Payer: Medicare Other | Source: Ambulatory Visit

## 2017-07-05 ENCOUNTER — Ambulatory Visit: Admission: RE | Admit: 2017-07-05 | Payer: Medicare HMO | Source: Ambulatory Visit | Admitting: Podiatry

## 2017-07-05 ENCOUNTER — Encounter: Admission: RE | Payer: Self-pay | Source: Ambulatory Visit

## 2017-07-05 SURGERY — AMPUTATION, TOE
Anesthesia: Choice | Laterality: Right

## 2017-07-28 ENCOUNTER — Other Ambulatory Visit: Payer: Self-pay | Admitting: Podiatry

## 2017-07-28 ENCOUNTER — Other Ambulatory Visit: Payer: Self-pay

## 2017-07-28 ENCOUNTER — Encounter
Admission: RE | Admit: 2017-07-28 | Discharge: 2017-07-28 | Disposition: A | Discharge status: Home / Self Care | Payer: Medicare HMO | Source: Ambulatory Visit | Attending: Podiatry | Admitting: Podiatry

## 2017-07-28 DIAGNOSIS — K219 Gastro-esophageal reflux disease without esophagitis: Secondary | ICD-10-CM | POA: Diagnosis not present

## 2017-07-28 DIAGNOSIS — M869 Osteomyelitis, unspecified: Secondary | ICD-10-CM | POA: Diagnosis present

## 2017-07-28 DIAGNOSIS — Z79899 Other long term (current) drug therapy: Secondary | ICD-10-CM | POA: Diagnosis not present

## 2017-07-28 DIAGNOSIS — F329 Major depressive disorder, single episode, unspecified: Secondary | ICD-10-CM | POA: Diagnosis not present

## 2017-07-28 DIAGNOSIS — J449 Chronic obstructive pulmonary disease, unspecified: Secondary | ICD-10-CM | POA: Diagnosis not present

## 2017-07-28 DIAGNOSIS — L97519 Non-pressure chronic ulcer of other part of right foot with unspecified severity: Secondary | ICD-10-CM | POA: Diagnosis not present

## 2017-07-28 DIAGNOSIS — M1990 Unspecified osteoarthritis, unspecified site: Secondary | ICD-10-CM | POA: Diagnosis not present

## 2017-07-28 DIAGNOSIS — Z791 Long term (current) use of non-steroidal anti-inflammatories (NSAID): Secondary | ICD-10-CM | POA: Diagnosis not present

## 2017-07-28 DIAGNOSIS — I10 Essential (primary) hypertension: Secondary | ICD-10-CM | POA: Diagnosis not present

## 2017-07-28 DIAGNOSIS — F17200 Nicotine dependence, unspecified, uncomplicated: Secondary | ICD-10-CM | POA: Diagnosis not present

## 2017-07-28 DIAGNOSIS — M109 Gout, unspecified: Secondary | ICD-10-CM | POA: Diagnosis not present

## 2017-07-28 LAB — BASIC METABOLIC PANEL
Anion gap: 7 (ref 5–15)
BUN: 9 mg/dL (ref 6–20)
CALCIUM: 9.1 mg/dL (ref 8.9–10.3)
CHLORIDE: 101 mmol/L (ref 101–111)
CO2: 25 mmol/L (ref 22–32)
CREATININE: 0.81 mg/dL (ref 0.44–1.00)
GFR calc non Af Amer: >60 mL/min (ref >60)
GLUCOSE: 96 mg/dL (ref 65–99)
Potassium: 3.5 mmol/L (ref 3.5–5.1)
Sodium: 133 mmol/L — ABNORMAL LOW (ref 135–145)

## 2017-07-28 LAB — CBC
HEMATOCRIT: 36.6 % (ref 35.0–47.0)
Hemoglobin: 12.7 g/dL (ref 12.0–16.0)
MCH: 34.3 pg — AB (ref 26.0–34.0)
MCHC: 34.7 g/dL (ref 32.0–36.0)
MCV: 99.1 fL (ref 80.0–100.0)
Platelets: 334 10*3/uL (ref 150–440)
RBC: 3.69 MIL/uL — ABNORMAL LOW (ref 3.80–5.20)
RDW: 14.5 % (ref 11.5–14.5)
WBC: 5.6 10*3/uL (ref 3.6–11.0)

## 2017-07-28 MED ORDER — CEFAZOLIN SODIUM-DEXTROSE 2-4 GM/100ML-% IV SOLN
2.0000 g | INTRAVENOUS | Status: AC
  Administered 2017-07-29: 2 g via INTRAVENOUS

## 2017-07-28 NOTE — Patient Instructions (Signed)
Your procedure is scheduled on: Tomorrow ARRIVE AT 6:00 AM Report to [place] ON 2ND FLOOR MEDICAL MALL ENTRANCE. To find out your arrival time please call [phone] between 1PM - 3PM on Today.  Remember: Instructions that are not followed completely may result in serious medical risk, up to and including death, or upon the discretion of your surgeon and anesthesiologist your surgery may need to be rescheduled.     _X__ 1. Do not eat food after midnight the night before your procedure.                 No gum chewing or hard candies. You may drink clear liquids up to 2 hours                 before you are scheduled to arrive for your surgery- DO not drink clear                 liquids within 2 hours of the start of your surgery.                 Clear Liquids include:  water, apple juice without pulp, clear carbohydrate                 drink such as Clearfast or Gatorade, Black Coffee or Tea (Do not add                 anything to coffee or tea).  __X__2.  On the morning of surgery brush your teeth with toothpaste and water, you                 may rinse your mouth with mouthwash if you wish.  Do not swallow any              toothpaste of mouthwash.     _X__ 3.  No Alcohol for 24 hours before or after surgery.   _X__ 4.  Do Not Smoke or use e-cigarettes For 24 Hours Prior to Your Surgery.                 Do not use any chewable tobacco products for at least 6 hours prior to                 surgery.  ____  5.  Bring all medications with you on the day of surgery if instructed.   __X__  6.  Notify your doctor if there is any change in your medical condition      (cold, fever, infections).     Do not wear jewelry, make-up, hairpins, clips or nail polish. Do not wear lotions, powders, or perfumes.  Do not shave 48 hours prior to surgery. Men may shave face and neck. Do not bring valuables to the hospital.    [facility] is not responsible for any belongings or  valuables.  Contacts, dentures/partials or body piercings may not be worn into surgery. Bring a case for your contacts, glasses or hearing aids, a denture cup will be supplied. Leave your suitcase in the car. After surgery it may be brought to your room. For patients admitted to the hospital, discharge time is determined by your treatment team.   Patients discharged the day of surgery will not be allowed to drive home.   Please read over the following fact sheets that you were given:   MRSA Information  __X__ Take these medicines the morning of surgery with A SIP OF WATER:  1. amlodipine  2. doxycycline  3. ranitidine  4.  5.  6.  ____ Fleet Enema (as directed)   __X__ Use CHG Soap/SAGE wipes as directed  ____ Use inhalers on the day of surgery  ____ Stop metformin/Janumet/Farxiga 2 days prior to surgery    ____ Take 1/2 of usual insulin dose the night before surgery. No insulin the morning          of surgery.   __x__ Stop Blood Thinners Coumadin/Plavix/Xarelto/Pleta/Pradaxa/Eliquis/Effient/Aspirin  on today  Or contact your Surgeon, Cardiologist or Medical Doctor regarding  ability to stop your blood thinners  __X__ Stop Anti-inflammatories 7 days before surgery such as Advil, Ibuprofen, Motrin,  BC or Goodies Powder, Naprosyn, Naproxen, Aleve, Aspirin TODAY   __X__ Stop all herbal supplements, fish oil or vitamin E until after surgery.    ____ Bring C-Pap to the hospital.

## 2017-07-28 NOTE — Anesthesia Preprocedure Evaluation (Addendum)
Anesthesia Evaluation  Patient identified by MRN, date of birth, ID band Patient awake    Reviewed: Allergy & Precautions, NPO status , Patient's Chart, lab work & pertinent test results  History of Anesthesia Complications Negative for: history of anesthetic complications  Airway Mallampati: II  TM Distance: >3 FB Neck ROM: Full    Dental  (+) Upper Dentures, Lower Dentures   Pulmonary neg sleep apnea, COPD,  COPD inhaler, Current Smoker,    breath sounds clear to auscultation- rhonchi (-) wheezing      Cardiovascular hypertension, Pt. on medications (-) CAD, (-) Past MI, (-) Cardiac Stents and (-) CABG  Rhythm:Regular Rate:Normal - Systolic murmurs and - Diastolic murmurs    Neuro/Psych negative neurological ROS  negative psych ROS   GI/Hepatic Neg liver ROS, GERD  ,  Endo/Other  negative endocrine ROSneg diabetes  Renal/GU negative Renal ROS     Musculoskeletal  (+) Arthritis ,   Abdominal (+) + obese,   Peds  Hematology negative hematology ROS (+)   Anesthesia Other Findings Past Medical History: No date: Arthritis No date: Bronchitis No date: COPD (chronic obstructive pulmonary disease) ([facility]) No date: GERD (gastroesophageal reflux disease) No date: Gout No date: Hypertension   Reproductive/Obstetrics                            Anesthesia Physical Anesthesia Plan  ASA: III  Anesthesia Plan: General   Post-op Pain Management:    Induction: Intravenous  PONV Risk Score and Plan: 1 and Propofol infusion  Airway Management Planned: Natural Airway  Additional Equipment:   Intra-op Plan:   Post-operative Plan:   Informed Consent: I have reviewed the patients History and Physical, chart, labs and discussed the procedure including the risks, benefits and alternatives for the proposed anesthesia with the patient or authorized representative who has indicated his/her  understanding and acceptance.   Dental advisory given  Plan Discussed with: CRNA and Anesthesiologist  Anesthesia Plan Comments:         Anesthesia Quick Evaluation

## 2017-07-29 ENCOUNTER — Encounter: Payer: Self-pay | Admitting: *Deleted

## 2017-07-29 ENCOUNTER — Ambulatory Visit
Admission: RE | Admit: 2017-07-29 | Discharge: 2017-07-29 | Disposition: A | Discharge status: Home / Self Care | Payer: Medicare HMO | Source: Ambulatory Visit | Attending: Podiatry | Admitting: Podiatry

## 2017-07-29 ENCOUNTER — Ambulatory Visit: Payer: Medicare HMO | Admitting: Anesthesiology

## 2017-07-29 ENCOUNTER — Encounter
Admission: RE | Disposition: A | Discharge status: Home / Self Care | Payer: Self-pay | Source: Ambulatory Visit | Attending: Podiatry

## 2017-07-29 DIAGNOSIS — Z791 Long term (current) use of non-steroidal anti-inflammatories (NSAID): Secondary | ICD-10-CM | POA: Insufficient documentation

## 2017-07-29 DIAGNOSIS — L97519 Non-pressure chronic ulcer of other part of right foot with unspecified severity: Secondary | ICD-10-CM | POA: Diagnosis not present

## 2017-07-29 DIAGNOSIS — M109 Gout, unspecified: Secondary | ICD-10-CM | POA: Insufficient documentation

## 2017-07-29 DIAGNOSIS — Z79899 Other long term (current) drug therapy: Secondary | ICD-10-CM | POA: Insufficient documentation

## 2017-07-29 DIAGNOSIS — I10 Essential (primary) hypertension: Secondary | ICD-10-CM | POA: Insufficient documentation

## 2017-07-29 DIAGNOSIS — F329 Major depressive disorder, single episode, unspecified: Secondary | ICD-10-CM | POA: Insufficient documentation

## 2017-07-29 DIAGNOSIS — J449 Chronic obstructive pulmonary disease, unspecified: Secondary | ICD-10-CM | POA: Insufficient documentation

## 2017-07-29 DIAGNOSIS — K219 Gastro-esophageal reflux disease without esophagitis: Secondary | ICD-10-CM | POA: Insufficient documentation

## 2017-07-29 DIAGNOSIS — M1990 Unspecified osteoarthritis, unspecified site: Secondary | ICD-10-CM | POA: Insufficient documentation

## 2017-07-29 DIAGNOSIS — F17200 Nicotine dependence, unspecified, uncomplicated: Secondary | ICD-10-CM | POA: Insufficient documentation

## 2017-07-29 HISTORY — PX: AMPUTATION: SHX166

## 2017-07-29 SURGERY — AMPUTATION DIGIT
Anesthesia: General | Site: First Toe | Laterality: Right | Wound class: Dirty or Infected

## 2017-07-29 MED ORDER — MIDAZOLAM HCL 2 MG/2ML IJ SOLN
INTRAMUSCULAR | Status: DC | PRN
  Administered 2017-07-29: 2 mg via INTRAVENOUS

## 2017-07-29 MED ORDER — FENTANYL CITRATE (PF) 100 MCG/2ML IJ SOLN
INTRAMUSCULAR | Status: DC | PRN
  Administered 2017-07-29 (×2): 50 ug via INTRAVENOUS

## 2017-07-29 MED ORDER — LIDOCAINE HCL (CARDIAC) PF 100 MG/5ML IV SOSY
PREFILLED_SYRINGE | INTRAVENOUS | Status: DC | PRN
  Administered 2017-07-29: 100 mg via INTRAVENOUS

## 2017-07-29 MED ORDER — FENTANYL CITRATE (PF) 100 MCG/2ML IJ SOLN
INTRAMUSCULAR | Status: AC
  Administered 2017-07-29: 25 ug via INTRAVENOUS
  Filled 2017-07-29: qty 2

## 2017-07-29 MED ORDER — LACTATED RINGERS IV SOLN
INTRAVENOUS | Status: DC
  Administered 2017-07-29: 07:00:00 via INTRAVENOUS

## 2017-07-29 MED ORDER — METOPROLOL TARTRATE 5 MG/5ML IV SOLN
INTRAVENOUS | Status: DC | PRN
  Administered 2017-07-29: 3 mg via INTRAVENOUS

## 2017-07-29 MED ORDER — PROPOFOL 10 MG/ML IV BOLUS
INTRAVENOUS | Status: AC
  Filled 2017-07-29: qty 20

## 2017-07-29 MED ORDER — CEFAZOLIN SODIUM-DEXTROSE 2-4 GM/100ML-% IV SOLN
INTRAVENOUS | Status: AC
  Filled 2017-07-29: qty 100

## 2017-07-29 MED ORDER — BUPIVACAINE HCL 0.5 % IJ SOLN
INTRAMUSCULAR | Status: DC | PRN
  Administered 2017-07-29: 10 mL

## 2017-07-29 MED ORDER — FAMOTIDINE 20 MG PO TABS
ORAL_TABLET | ORAL | Status: AC
  Filled 2017-07-29: qty 1

## 2017-07-29 MED ORDER — ONDANSETRON HCL 4 MG/2ML IJ SOLN
INTRAMUSCULAR | Status: DC | PRN
  Administered 2017-07-29: 4 mg via INTRAVENOUS

## 2017-07-29 MED ORDER — MORPHINE SULFATE (PF) 4 MG/ML IV SOLN: 4.0000 mg | INTRAVENOUS | Status: DC | PRN

## 2017-07-29 MED ORDER — HYDROMORPHONE HCL 1 MG/ML IJ SOLN
INTRAMUSCULAR | Status: AC
  Filled 2017-07-29: qty 1

## 2017-07-29 MED ORDER — LACTATED RINGERS IV SOLN
INTRAVENOUS | Status: DC | PRN
  Administered 2017-07-29: 07:00:00 via INTRAVENOUS

## 2017-07-29 MED ORDER — POVIDONE-IODINE 7.5 % EX SOLN
Freq: Once | CUTANEOUS | Status: DC
  Filled 2017-07-29: qty 118

## 2017-07-29 MED ORDER — OXYCODONE HCL 5 MG PO TABS: 5.0000 mg | ORAL_TABLET | ORAL | 0 refills | Status: AC | PRN

## 2017-07-29 MED ORDER — FENTANYL CITRATE (PF) 100 MCG/2ML IJ SOLN
25.0000 ug | INTRAMUSCULAR | Status: DC | PRN
  Administered 2017-07-29 (×2): 25 ug via INTRAVENOUS

## 2017-07-29 MED ORDER — PROPOFOL 10 MG/ML IV BOLUS
INTRAVENOUS | Status: DC | PRN
  Administered 2017-07-29: 150 mg via INTRAVENOUS

## 2017-07-29 MED ORDER — OXYCODONE HCL 5 MG PO TABS: 5.0000 mg | ORAL_TABLET | Freq: Once | ORAL | Status: DC | PRN

## 2017-07-29 MED ORDER — OXYCODONE HCL 5 MG/5ML PO SOLN: 5.0000 mg | Freq: Once | ORAL | Status: DC | PRN

## 2017-07-29 MED ORDER — NEOMYCIN-POLYMYXIN B GU 40-200000 IR SOLN
Status: AC
  Filled 2017-07-29: qty 2

## 2017-07-29 MED ORDER — MIDAZOLAM HCL 2 MG/2ML IJ SOLN
INTRAMUSCULAR | Status: AC
  Filled 2017-07-29: qty 2

## 2017-07-29 MED ORDER — FAMOTIDINE 20 MG PO TABS
20.0000 mg | ORAL_TABLET | Freq: Once | ORAL | Status: AC
  Administered 2017-07-29: 20 mg via ORAL

## 2017-07-29 MED ORDER — NEOMYCIN-POLYMYXIN B GU 40-200000 IR SOLN
Status: DC | PRN
  Administered 2017-07-29: 2 mL

## 2017-07-29 MED ORDER — MEPERIDINE HCL 50 MG/ML IJ SOLN: 6.2500 mg | INTRAMUSCULAR | Status: DC | PRN

## 2017-07-29 MED ORDER — FENTANYL CITRATE (PF) 100 MCG/2ML IJ SOLN
INTRAMUSCULAR | Status: AC
  Filled 2017-07-29: qty 2

## 2017-07-29 MED ORDER — PROMETHAZINE HCL 25 MG/ML IJ SOLN: 6.2500 mg | INTRAMUSCULAR | Status: DC | PRN

## 2017-07-29 MED ORDER — HYDROMORPHONE HCL 1 MG/ML IJ SOLN
INTRAMUSCULAR | Status: DC | PRN
  Administered 2017-07-29: .4 mg via INTRAVENOUS

## 2017-07-29 MED ORDER — BUPIVACAINE HCL (PF) 0.5 % IJ SOLN
INTRAMUSCULAR | Status: AC
  Filled 2017-07-29: qty 30

## 2017-07-29 SURGICAL SUPPLY — 44 items
BANDAGE ACE 4X5 VEL STRL LF (GAUZE/BANDAGES/DRESSINGS) ×3 IMPLANT
BLADE MED AGGRESSIVE (BLADE) ×3 IMPLANT
BLADE OSC/SAGITTAL MD 5.5X18 (BLADE) ×3 IMPLANT
BLADE SURG 15 STRL LF DISP TIS (BLADE) ×2 IMPLANT
BLADE SURG 15 STRL SS (BLADE) ×4
BLADE SURG MINI STRL (BLADE) ×3 IMPLANT
BNDG ESMARK 4X12 TAN STRL LF (GAUZE/BANDAGES/DRESSINGS) ×3 IMPLANT
BNDG GAUZE 4.5X4.1 6PLY STRL (MISCELLANEOUS) ×3 IMPLANT
CANISTER SUCT 1200ML W/VALVE (MISCELLANEOUS) ×3 IMPLANT
CLOSURE WOUND 1/4X4 (GAUZE/BANDAGES/DRESSINGS) ×1
CUFF TOURN 18 STER (MISCELLANEOUS) ×3 IMPLANT
CUFF TOURN DUAL PL 12 NO SLV (MISCELLANEOUS) IMPLANT
DRAPE FLUOR MINI C-ARM 54X84 (DRAPES) ×3 IMPLANT
DURAPREP 26ML APPLICATOR (WOUND CARE) ×3 IMPLANT
ELECT REM PT RETURN 9FT ADLT (ELECTROSURGICAL) ×3
ELECTRODE REM PT RTRN 9FT ADLT (ELECTROSURGICAL) ×1 IMPLANT
GAUZE PETRO XEROFOAM 1X8 (MISCELLANEOUS) ×3 IMPLANT
GAUZE SPONGE 4X4 12PLY STRL (GAUZE/BANDAGES/DRESSINGS) ×3 IMPLANT
GAUZE STRETCH 2X75IN STRL (MISCELLANEOUS) ×3 IMPLANT
GLOVE BIO SURGEON STRL SZ7.5 (GLOVE) ×3 IMPLANT
GLOVE INDICATOR 8.0 STRL GRN (GLOVE) ×3 IMPLANT
GOWN STRL REUS W/ TWL LRG LVL3 (GOWN DISPOSABLE) ×2 IMPLANT
GOWN STRL REUS W/TWL LRG LVL3 (GOWN DISPOSABLE) ×4
HANDPIECE VERSAJET DEBRIDEMENT (MISCELLANEOUS) ×3 IMPLANT
KIT TURNOVER KIT A (KITS) ×3 IMPLANT
LABEL OR SOLS (LABEL) ×3 IMPLANT
NEEDLE FILTER BLUNT 18X 1/2SAF (NEEDLE) ×2
NEEDLE FILTER BLUNT 18X1 1/2 (NEEDLE) ×1 IMPLANT
NEEDLE HYPO 25X1 1.5 SAFETY (NEEDLE) ×6 IMPLANT
NS IRRIG 500ML POUR BTL (IV SOLUTION) ×3 IMPLANT
PACK EXTREMITY ARMC (MISCELLANEOUS) ×3 IMPLANT
SOL .9 NS 3000ML IRR  AL (IV SOLUTION) ×2
SOL .9 NS 3000ML IRR UROMATIC (IV SOLUTION) ×1 IMPLANT
SOL PREP PVP 2OZ (MISCELLANEOUS) ×3
SOLUTION PREP PVP 2OZ (MISCELLANEOUS) ×1 IMPLANT
STOCKINETTE STRL 6IN 960660 (GAUZE/BANDAGES/DRESSINGS) ×3 IMPLANT
STRIP CLOSURE SKIN 1/4X4 (GAUZE/BANDAGES/DRESSINGS) ×2 IMPLANT
SUT ETHILON 3-0 FS-10 30 BLK (SUTURE) ×3
SUT ETHILON 4-0 (SUTURE)
SUT ETHILON 4-0 FS2 18XMFL BLK (SUTURE)
SUTURE EHLN 3-0 FS-10 30 BLK (SUTURE) ×1 IMPLANT
SUTURE ETHLN 4-0 FS2 18XMF BLK (SUTURE) IMPLANT
SWAB DUAL CULTURE TRANS RED ST (MISCELLANEOUS) ×3 IMPLANT
SYR 10ML LL (SYRINGE) ×3 IMPLANT

## 2017-07-29 NOTE — Anesthesia Procedure Notes (Signed)
Procedure Name: LMA Insertion Date/Time: 07/29/2017 7:37 AM Performed by: Justus Memory, CRNA Pre-anesthesia Checklist: Patient identified, Patient being monitored, Timeout performed, Emergency Drugs available and Suction available Patient Re-evaluated:Patient Re-evaluated prior to induction Oxygen Delivery Method: Circle system utilized Preoxygenation: Pre-oxygenation with 100% oxygen Induction Type: IV induction Ventilation: Mask ventilation without difficulty LMA: LMA inserted LMA Size: 4.0 Tube type: Oral Number of attempts: 1 Placement Confirmation: positive ETCO2 and breath sounds checked- equal and bilateral Tube secured with: Tape Dental Injury: Teeth and Oropharynx as per pre-operative assessment

## 2017-07-29 NOTE — Op Note (Signed)
Date of operation: 07/29/2017.  Surgeon: Durward Fortes D.P.M.  Preoperative diagnosis: Osteomyelitis right great toe distal phalanx.  Postoperative diagnosis: Same.  Procedure: Amputation right great toe.  Anesthesia: LMA with local.  Hemostasis: None.  Estimated blood loss: Less than 5 cc.  Cultures: Bone culture distal phalanx right great toe.  Pathology: Right great toe.  Complications: Minimal bleeding during the procedure.  Operative indications: This is a 62 year old female with chronic ulceration on the right great toe with obvious osteomyelitis and exposed bone not responding to outpatient treatment.  Elects for surgical amputation.  Operative procedure: Patient was taken to the operating room and placed on the table in the supine position.  Following satisfactory sedation the right great toe and forefoot was anesthetized with 10 cc of 0.5% Marcaine plain.  Patient continued to very fairly agitated on the table and anesthesia was switched over to LMA.     Attention was then directed to the distal aspect of the right great toe where a fishmouth type incision was made coursing medial to lateral and carried sharply down to the level of bone.  Dissection carried back past the level of the IPJ and the distal aspect of the proximal phalanx was transected with a sagittal saw and the distal portion of the toe was removed in toto.  Minimal bleeding was encountered throughout the procedure.  Good healthy tissues remained following removal of the toe and the wound was flushed with copious amounts of sterile saline and then closed using 3-0 nylon vertical mattress and simple interrupted sutures.  Xeroform 4 x 4's 2 inch cotton forearm applied to the right foot followed by Kerlix and an Ace wrap.  Patient tolerated procedure and anesthesia well and was awakened and transported to the PACU with vital signs stable in good condition.

## 2017-07-29 NOTE — Transfer of Care (Signed)
Immediate Anesthesia Transfer of Care Note  Patient: Jackie Macias  Procedure(s) Performed: AMPUTATION DIGIT/ [phone] (Right First Toe)  Patient Location: PACU  Anesthesia Type:General  Level of Consciousness: sedated  Airway & Oxygen Therapy: Patient Spontanous Breathing and Patient connected to face mask  Post-op Assessment: Report given to RN and Post -op Vital signs reviewed and stable  Post vital signs: Reviewed and stable  Last Vitals:  Vitals Value Taken Time  BP 184/89 07/29/2017  8:23 AM  Temp    Pulse 84 07/29/2017  8:36 AM  Resp 16 07/29/2017  8:36 AM  SpO2 100 % 07/29/2017  8:36 AM  Vitals shown include unvalidated device data.  Last Pain:  Vitals:   07/29/17 0824  TempSrc:   PainSc: (P) Asleep         Complications: No apparent anesthesia complications

## 2017-07-29 NOTE — Anesthesia Post-op Follow-up Note (Signed)
Anesthesia QCDR form completed.        

## 2017-07-29 NOTE — Interval H&P Note (Signed)
History and Physical Interval Note:  07/29/2017 7:02 AM  Jackie Macias  has presented today for surgery, with the diagnosis of m86.171  The various methods of treatment have been discussed with the patient and family. After consideration of risks, benefits and other options for treatment, the patient has consented to  Procedure(s): AMPUTATION DIGIT/ [id number] (Right) as a surgical intervention .  The patient's history has been reviewed, patient examined, no change in status, stable for surgery.  I have reviewed the patient's chart and labs.  Questions were answered to the patient's satisfaction.     Durward Fortes

## 2017-07-29 NOTE — Discharge Instructions (Addendum)
1.  Keep the bandage on the right foot clean, dry, and do not remove.  2.  Sponge bathe only right lower extremity.  3.  Wear surgical shoe on the right foot whenever walking or standing.  4.  Take 1 pain pill, oxycodone, every 4 hours only if needed for pain.    AMBULATORY SURGERY  DISCHARGE INSTRUCTIONS   1) The drugs that you were given will stay in your system until tomorrow so for the next 24 hours you should not:  A) Drive an automobile B) Make any legal decisions C) Drink any alcoholic beverage   2) You may resume regular meals tomorrow.  Today it is better to start with liquids and gradually work up to solid foods.  You may eat anything you prefer, but it is better to start with liquids, then soup and crackers, and gradually work up to solid foods.   3) Please notify your doctor immediately if you have any unusual bleeding, trouble breathing, redness and pain at the surgery site, drainage, fever, or pain not relieved by medication.    4) Additional Instructions:        Please contact your physician with any problems or Same Day Surgery at [phone], Monday through Friday 6 am to 4 pm, or [place] at [facility] number at [phone].

## 2017-07-29 NOTE — Anesthesia Postprocedure Evaluation (Signed)
Anesthesia Post Note  Patient: Jackie Macias  Procedure(s) Performed: AMPUTATION DIGIT/ [phone] (Right First Toe)  Patient location during evaluation: PACU Anesthesia Type: General Level of consciousness: awake and alert and oriented Pain management: pain level controlled Vital Signs Assessment: post-procedure vital signs reviewed and stable Respiratory status: spontaneous breathing, nonlabored ventilation and respiratory function stable Cardiovascular status: blood pressure returned to baseline and stable Postop Assessment: no signs of nausea or vomiting Anesthetic complications: no     Last Vitals:  Vitals:   07/29/17 0925 07/29/17 1003  BP: (!) 163/75 (!) 150/80  Pulse: 67   Resp: 18   Temp: (!) 36.2 C   SpO2: 100%     Last Pain:  Vitals:   07/29/17 0925  TempSrc:   PainSc: 0-No pain                 Fatiha Guzy

## 2017-08-03 LAB — SURGICAL PATHOLOGY

## 2017-08-04 LAB — AEROBIC/ANAEROBIC CULTURE W GRAM STAIN (SURGICAL/DEEP WOUND)

## 2017-08-04 LAB — AEROBIC/ANAEROBIC CULTURE (SURGICAL/DEEP WOUND)

## 2017-08-30 ENCOUNTER — Encounter (INDEPENDENT_AMBULATORY_CARE_PROVIDER_SITE_OTHER): Payer: Medicare HMO | Admitting: Vascular Surgery

## 2017-09-05 ENCOUNTER — Other Ambulatory Visit (INDEPENDENT_AMBULATORY_CARE_PROVIDER_SITE_OTHER): Payer: Self-pay

## 2017-09-05 DIAGNOSIS — T8189XA Other complications of procedures, not elsewhere classified, initial encounter: Secondary | ICD-10-CM

## 2017-09-06 ENCOUNTER — Encounter (INDEPENDENT_AMBULATORY_CARE_PROVIDER_SITE_OTHER): Payer: Medicare HMO | Admitting: Vascular Surgery

## 2017-09-07 ENCOUNTER — Encounter (INDEPENDENT_AMBULATORY_CARE_PROVIDER_SITE_OTHER): Payer: Medicare HMO

## 2017-09-08 ENCOUNTER — Ambulatory Visit (INDEPENDENT_AMBULATORY_CARE_PROVIDER_SITE_OTHER): Payer: Medicare HMO | Admitting: Vascular Surgery

## 2017-09-08 ENCOUNTER — Encounter (INDEPENDENT_AMBULATORY_CARE_PROVIDER_SITE_OTHER): Payer: Self-pay | Admitting: Vascular Surgery

## 2017-09-08 ENCOUNTER — Encounter (INDEPENDENT_AMBULATORY_CARE_PROVIDER_SITE_OTHER): Payer: Self-pay

## 2017-09-08 ENCOUNTER — Other Ambulatory Visit (INDEPENDENT_AMBULATORY_CARE_PROVIDER_SITE_OTHER): Payer: Self-pay | Admitting: Vascular Surgery

## 2017-09-08 ENCOUNTER — Ambulatory Visit
Admission: RE | Admit: 2017-09-08 | Discharge: 2017-09-08 | Disposition: A | Discharge status: Home / Self Care | Payer: Medicare HMO | Source: Ambulatory Visit | Attending: Vascular Surgery | Admitting: Vascular Surgery

## 2017-09-08 VITALS — BP 145/83 | HR 79 | Resp 16 | Ht 66.0 in | Wt 222.8 lb

## 2017-09-08 DIAGNOSIS — I739 Peripheral vascular disease, unspecified: Secondary | ICD-10-CM | POA: Insufficient documentation

## 2017-09-08 DIAGNOSIS — L97519 Non-pressure chronic ulcer of other part of right foot with unspecified severity: Secondary | ICD-10-CM | POA: Diagnosis not present

## 2017-09-08 DIAGNOSIS — T8189XA Other complications of procedures, not elsewhere classified, initial encounter: Secondary | ICD-10-CM | POA: Diagnosis present

## 2017-09-08 DIAGNOSIS — I7389 Other specified peripheral vascular diseases: Secondary | ICD-10-CM | POA: Diagnosis not present

## 2017-09-08 DIAGNOSIS — I10 Essential (primary) hypertension: Secondary | ICD-10-CM

## 2017-09-08 DIAGNOSIS — X58XXXA Exposure to other specified factors, initial encounter: Secondary | ICD-10-CM | POA: Insufficient documentation

## 2017-09-08 NOTE — Progress Notes (Signed)
Subjective:    Patient ID: Jackie Macias, female    DOB: [birth year], 62 y.o.   MRN: [id number] Chief Complaint  Patient presents with  . Follow-up    abi results   Presents as a new patient referred by Dr. Cleda Mccreedy for evaluation of a slow healing right toe amputation site.  On Jul 29, 2017, the patient underwent amputation of her right big toe due to osteomyelitis right great toe distal phalanx.  The patient notes slow healing and dehiscence of the amputation site.  Dr. Sammuel Bailiff referral note states purulent drainage from the amputation site during his last examination.  The patient denies any claudication-like symptoms or rest pain to the bilateral lower extremity.  An ABI conducted at [facility] radiology department was notable for moderately decreased right ABI, significant diseased based on pressure change at the right popliteal and calf level. Left lower extremity was essentially normal ABI with some mild left tibial level disease.  Patient denies any erythema to the right toe amputation site.  Patient denies any fever, nausea vomiting.  Review of Systems  Constitutional: Negative.   HENT: Negative.   Eyes: Negative.   Respiratory: Negative.   Cardiovascular: Negative.   Gastrointestinal: Negative.   Endocrine: Negative.   Genitourinary: Negative.   Musculoskeletal: Negative.   Skin: Positive for wound.  Allergic/Immunologic: Negative.   Neurological: Negative.   Hematological: Negative.   Psychiatric/Behavioral: Negative.       Objective:   Physical Exam  Constitutional: She is oriented to person, place, and time. She appears well-developed and well-nourished. No distress.  HENT:  Head: Normocephalic and atraumatic.  Right Ear: External ear normal.  Left Ear: External ear normal.  Eyes: Pupils are equal, round, and reactive to light. Conjunctivae and EOM are normal.  Neck: Normal range of motion.  Cardiovascular: Normal rate, regular rhythm, normal  heart sounds and intact distal pulses.  Pulmonary/Chest: Effort normal and breath sounds normal.  Musculoskeletal: Normal range of motion. She exhibits no edema.  Neurological: She is alert and oriented to person, place, and time.  Skin: She is not diaphoretic.  Right first toe: Amputation site with dehiscence to the lateral aspect.  Some erythema surrounding the imitation site.  No cellulitis tracking up the foot.  No drainage noted  Psychiatric: She has a normal mood and affect. Her behavior is normal. Judgment and thought content normal.  Vitals reviewed.  BP (!) 145/83 (BP Location: Left Arm)   Pulse 79   Resp 16   Ht 5\' 6"  (1.676 m)   Wt 222 lb 12.8 oz (101.1 kg)   BMI 35.96 kg/m   Past Medical History:  Diagnosis Date  . Arthritis   . Bronchitis   . COPD (chronic obstructive pulmonary disease) ([place])   . GERD (gastroesophageal reflux disease)   . Gout   . Hypertension    Social History   Socioeconomic History  . Marital status: Single    Spouse name: Not on file  . Number of children: Not on file  . Years of education: Not on file  . Highest education level: Not on file  Occupational History  . Not on file  Social Needs  . Financial resource strain: Not on file  . Food insecurity:    Worry: Not on file    Inability: Not on file  . Transportation needs:    Medical: Not on file    Non-medical: Not on file  Tobacco Use  . Smoking status: Current Every  Day Smoker    Packs/day: 0.50  . Smokeless tobacco: Former Network engineer and Sexual Activity  . Alcohol use: Yes    Alcohol/week: 1.8 oz    Types: 3 Cans of beer per week    Comment: occasional  . Drug use: No  . Sexual activity: Not on file  Lifestyle  . Physical activity:    Days per week: Not on file    Minutes per session: Not on file  . Stress: Not on file  Relationships  . Social connections:    Talks on phone: Not on file    Gets together: Not on file    Attends religious service: Not on file     Active member of club or organization: Not on file    Attends meetings of clubs or organizations: Not on file    Relationship status: Not on file  . Intimate partner violence:    Fear of current or ex partner: Not on file    Emotionally abused: Not on file    Physically abused: Not on file    Forced sexual activity: Not on file  Other Topics Concern  . Not on file  Social History Narrative  . Not on file   Past Surgical History:  Procedure Laterality Date  . AMPUTATION Right 07/29/2017   Procedure: AMPUTATION DIGIT/ [id number];  Surgeon: Sharlotte Alamo, DPM;  Location: [facility] ORS;  Service: Podiatry;  Laterality: Right;  . CHOLECYSTECTOMY    . facial biopsy Right   . JOINT REPLACEMENT Right 12/2016   THR  . TONSILLECTOMY    . TOTAL HIP ARTHROPLASTY Right 01/17/2017   Procedure: TOTAL HIP ARTHROPLASTY ANTERIOR APPROACH;  Surgeon: Lovell Sheehan, MD;  Location: [facility] ORS;  Service: Orthopedics;  Laterality: Right;   Family History  Problem Relation Age of Onset  . Breast cancer Sister 58  . Breast cancer Other    No Known Allergies     Assessment & Plan:  Presents as a new patient referred by Dr. Cleda Mccreedy for evaluation of a slow healing right toe amputation site.  On Jul 29, 2017, the patient underwent amputation of her right big toe due to osteomyelitis right great toe distal phalanx.  The patient notes slow healing and dehiscence of the amputation site.  Dr. Sammuel Bailiff referral note states purulent drainage from the amputation site during his last examination.  The patient denies any claudication-like symptoms or rest pain to the bilateral lower extremity.  An ABI conducted at [facility] radiology department was notable for moderately decreased right ABI, significant diseased based on pressure change at the right popliteal and calf level. Left lower extremity was essentially normal ABI with some mild left tibial level disease.  Patient denies any erythema to the right toe  amputation site.  Patient denies any fever, nausea vomiting.  1. PAD (peripheral artery disease) ([place]) - New Patient presents for evaluation of a slow healing right first toe amputation site. Patient with a past medical history of osteomyelitis and now peripheral artery disease noted on today's ABI Recommend a right lower extremity angiogram with possible intervention to assess the patient's anatomy and degree of contributing peripheral artery disease and if appropriate an attempt to revascularize the leg can be made at that time. Procedure, risks and benefits explained to the patient All questions answered The patient wishes to proceed I have discussed with the patient at length the risk factors for and pathogenesis of atherosclerotic disease and encouraged a healthy diet, regular exercise  regimen and blood pressure / glucose control.  The patient was encouraged to call the office in the interim if he experiences any claudication like symptoms, rest pain or ulcers to his feet / toes.  2. Ulcer of toe of right foot, unspecified ulcer stage ([place]) - New As above  3. Essential hypertension - Stable Encouraged good control as its slows the progression of atherosclerotic disease  Current Outpatient Medications on File Prior to Visit  Medication Sig Dispense Refill  . amLODipine (NORVASC) 10 MG tablet Take 10 mg by mouth daily.    . Aspirin-Caffeine (BC FAST PAIN RELIEF PO) Take 1 packet by mouth daily as needed (pain).    . cyclobenzaprine (FLEXERIL) 10 MG tablet Take 10 mg by mouth daily.   0  . doxycycline (VIBRAMYCIN) 100 MG capsule Take 100 mg by mouth daily.     . furosemide (LASIX) 20 MG tablet Take 20 mg by mouth daily.    . naproxen (NAPROSYN) 500 MG tablet Take 1 tablet (500 mg total) by mouth 2 (two) times daily with a meal. 60 tablet 0  . oxyCODONE (ROXICODONE) 5 MG immediate release tablet Take 1 tablet (5 mg total) by mouth every 4 (four) hours as needed. 20 tablet 0  . ranitidine  (ZANTAC) 150 MG tablet Take 150 mg by mouth daily.    Marland Kitchen aspirin EC 325 MG EC tablet Take 1 tablet (325 mg total) by mouth daily with breakfast. (Patient not taking: Reported on 06/22/2017) 30 tablet 0  . docusate sodium (COLACE) 100 MG capsule Take 1 capsule (100 mg total) by mouth 2 (two) times daily. (Patient not taking: Reported on 06/22/2017) 10 capsule 0  . senna (SENOKOT) 8.6 MG TABS tablet Take 1 tablet (8.6 mg total) by mouth 2 (two) times daily. (Patient not taking: Reported on 06/22/2017) 120 each 0   No current facility-administered medications on file prior to visit.    There are no Patient Instructions on file for this visit. No follow-ups on file.  Markevius Trombetta A Maleka Contino, PA-C

## 2017-09-12 ENCOUNTER — Other Ambulatory Visit: Payer: Medicare HMO

## 2017-09-19 ENCOUNTER — Other Ambulatory Visit (INDEPENDENT_AMBULATORY_CARE_PROVIDER_SITE_OTHER): Payer: Self-pay | Admitting: Vascular Surgery

## 2017-09-22 IMAGING — CT CT NECK W/ CM
2 of 3 series · 7 of 14 positions shown, 8 images · IV contrast (omnipaque)
Comparison: None.

CLINICAL DATA: Palpable mass right neck 8 months.  Nonpainful

EXAM:
CT NECK WITH CONTRAST
TECHNIQUE: Multidetector CT imaging of the neck was performed using the
standard protocol following the bolus administration of intravenous
contrast.
CONTRAST:  75mL OMNIPAQUE IOHEXOL 350 MG/ML SOLN

[Series 2: axial neck · axial · 0.52mm/px · z∈[-278,-150]mm · 3 of 128 slices shown]
[im 32/128  bone]
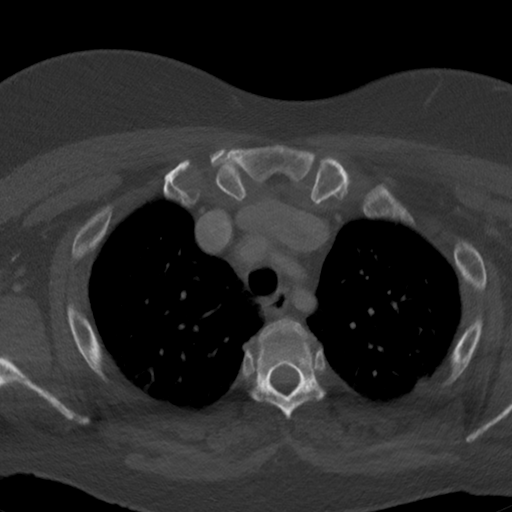
[im 64/128  bone]
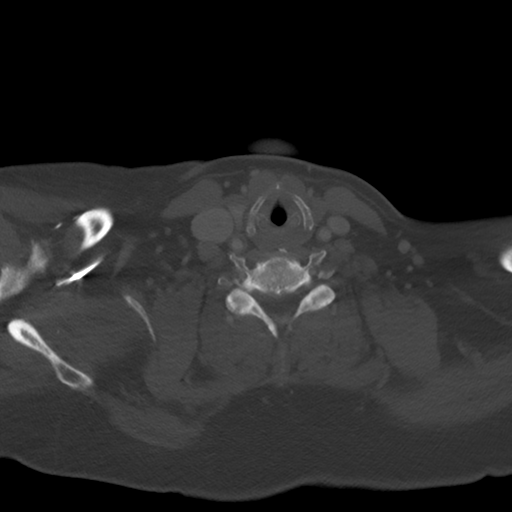
[im 96/128  bone]
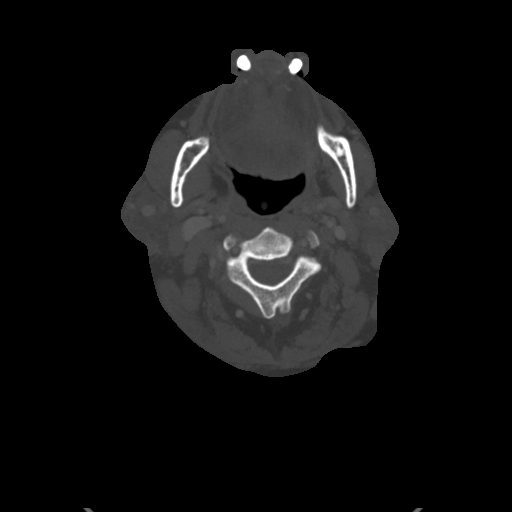

[Series 7: orthogonal ax · axial · 0.49mm/px · z∈[-325,-156]mm · 4 of 147 slices shown, 5 images]
[im 30/147  soft-tissue]
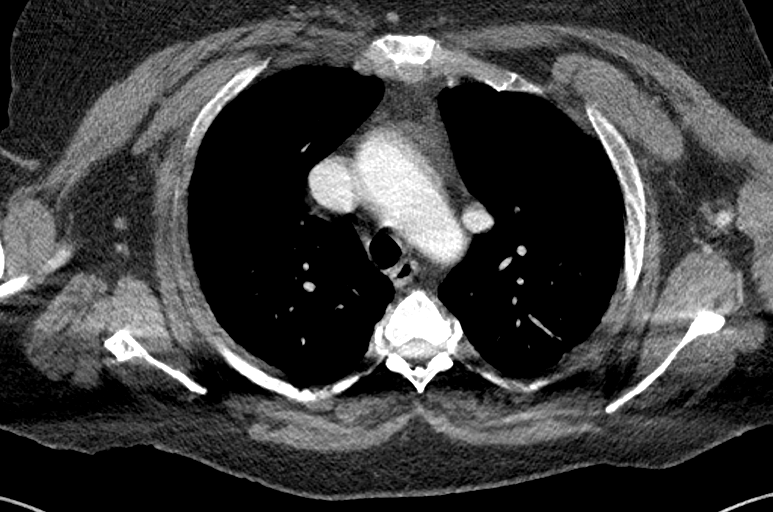
[im 30/147  bone]
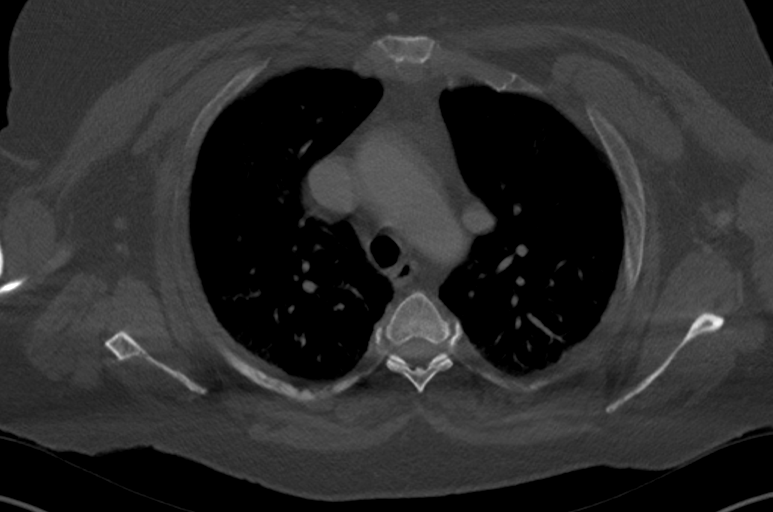
[im 59/147  bone]
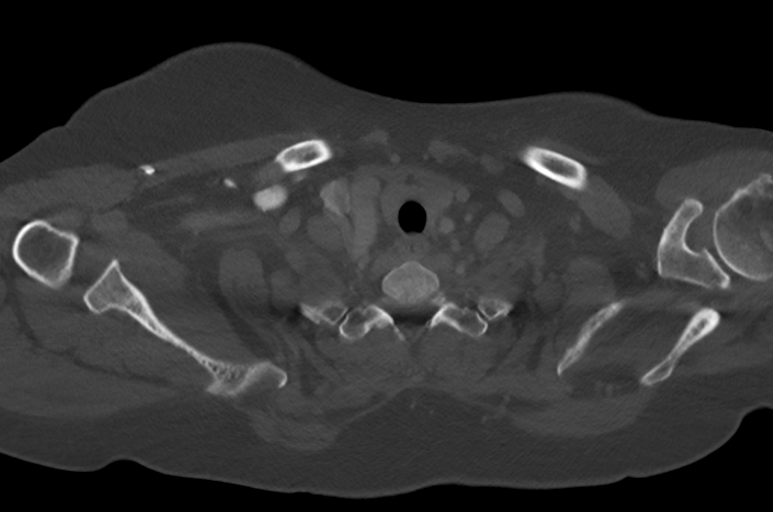
[im 88/147  bone]
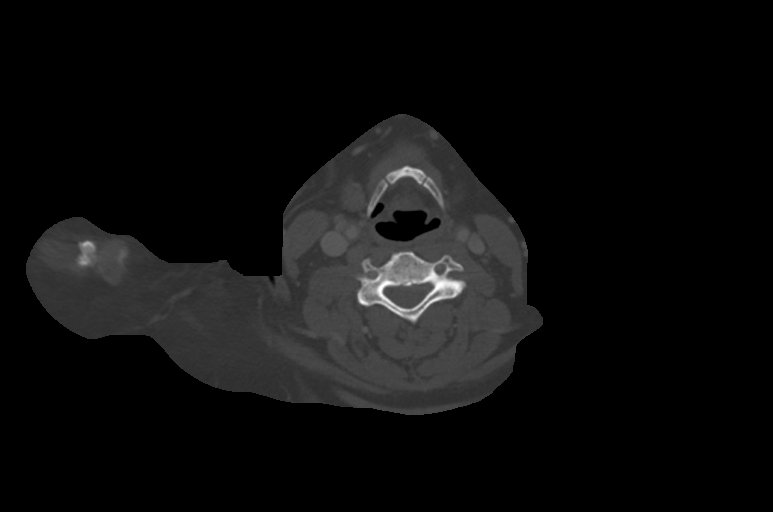
[im 117/147  bone]
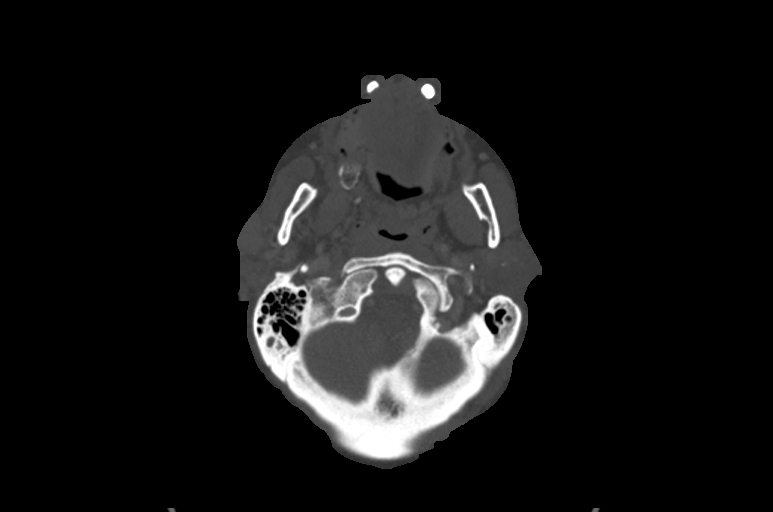

[7 of 14 positions shown; findings below may reference images not displayed]

FINDINGS: Pharynx and larynx: Mild asymmetry of the right tonsil. Recommend
direct visualization. No abscess. Tumor not excluded. Left tonsil
normal. No airway compromise. Mild thickening of the soft palate.
Epiglottis and larynx normal.

Salivary glands: Multiple solid enhancing homogeneous nodules in the
right parotid gland. The largest is located in the upper parotid
gland along the superior surface and measures 20 x 26 mm. This could
be extra parotid and could be a lymph node. 12 mm intra parotid
enhancing nodule in the midportion. 16 mm enhancing nodule just
below the right parotid which appears extra parotid in location. 7
mm enhancing solid nodule left superior parotid. These could be
multifocal parotid lesions versus lymph nodes. Submandibular gland
normal bilaterally.

Thyroid: Negative

Lymph nodes: Possible parotid lymph nodes bilaterally as described
above. No additional adenopathy in the neck or upper mediastinum.

Vascular: Atherosclerotic disease in the carotid bifurcation
bilaterally with significant carotid stenosis on the left. Carotid
Doppler versus CTA recommended for further evaluation. Jugular vein
patent bilaterally.

Limited intracranial: Negative

Visualized orbits: Negative

Mastoids and visualized paranasal sinuses: Negative

Skeleton: Negative for cervical spine fracture. No acute bone
lesion.

Upper chest: Lung apices clear
IMPRESSION: Multiple solid enhancing nodules in and around the right parotid
gland. Smaller nodule left parotid gland. These may represent
multiple lymph nodes. Metastatic disease and lymphoma is a
possibility. Multifocal parotid tumor is also a consideration
however pleomorphic adenoma is not typically multifocal and
Warthin's tumor typically is not homogeneous and solid in appearance
on CT.

No additional adenopathy in the neck.

Enlargement of the right tonsil. Recommend direct visualization and
possible biopsy to rule out tumor of the right tonsil.

Significant left carotid stenosis. Recommend follow-up CTA of the
neck or carotid Doppler.

## 2017-09-26 ENCOUNTER — Encounter
Admission: RE | Admit: 2017-09-26 | Discharge: 2017-09-26 | Disposition: A | Discharge status: Home / Self Care | Payer: Medicare HMO | Source: Ambulatory Visit | Attending: Vascular Surgery | Admitting: Vascular Surgery

## 2017-09-26 ENCOUNTER — Other Ambulatory Visit: Payer: Self-pay

## 2017-09-26 DIAGNOSIS — K219 Gastro-esophageal reflux disease without esophagitis: Secondary | ICD-10-CM | POA: Diagnosis not present

## 2017-09-26 DIAGNOSIS — I10 Essential (primary) hypertension: Secondary | ICD-10-CM | POA: Diagnosis not present

## 2017-09-26 DIAGNOSIS — I739 Peripheral vascular disease, unspecified: Secondary | ICD-10-CM | POA: Diagnosis present

## 2017-09-26 DIAGNOSIS — I70235 Atherosclerosis of native arteries of right leg with ulceration of other part of foot: Secondary | ICD-10-CM | POA: Diagnosis not present

## 2017-09-26 DIAGNOSIS — J449 Chronic obstructive pulmonary disease, unspecified: Secondary | ICD-10-CM | POA: Diagnosis not present

## 2017-09-26 DIAGNOSIS — M1990 Unspecified osteoarthritis, unspecified site: Secondary | ICD-10-CM | POA: Diagnosis not present

## 2017-09-26 DIAGNOSIS — Z7982 Long term (current) use of aspirin: Secondary | ICD-10-CM | POA: Diagnosis not present

## 2017-09-26 DIAGNOSIS — L97519 Non-pressure chronic ulcer of other part of right foot with unspecified severity: Secondary | ICD-10-CM | POA: Diagnosis not present

## 2017-09-26 DIAGNOSIS — F17210 Nicotine dependence, cigarettes, uncomplicated: Secondary | ICD-10-CM | POA: Diagnosis not present

## 2017-09-26 DIAGNOSIS — M109 Gout, unspecified: Secondary | ICD-10-CM | POA: Diagnosis not present

## 2017-09-26 LAB — CREATININE, SERUM
Creatinine, Ser: 0.67 mg/dL (ref 0.44–1.00)
GFR calc non Af Amer: >60 mL/min (ref >60)

## 2017-09-26 LAB — BUN: BUN: 21 mg/dL (ref 8–23)

## 2017-09-26 MED ORDER — CEFAZOLIN SODIUM-DEXTROSE 2-4 GM/100ML-% IV SOLN
2.0000 g | Freq: Once | INTRAVENOUS | Status: AC
  Administered 2017-09-27: 2 g via INTRAVENOUS

## 2017-09-26 NOTE — Patient Instructions (Signed)
FOLLOW INSTRUCTIONS GIVEN TO YOU BY [facility].  Your procedure is scheduled with Dr. Delana Meyer                        On:  Tuesday, September 27, 2017    Go to '[facility]' on the first floor of the [facility].  Do not eat or drink 8 hours prior to your procedure.    Please call Dr Nino Parsley office with any questions or concerns: [phone].  You will need to have someone drive you home and stay with you the night of the procedure.

## 2017-09-27 ENCOUNTER — Other Ambulatory Visit (INDEPENDENT_AMBULATORY_CARE_PROVIDER_SITE_OTHER): Payer: Self-pay

## 2017-09-27 ENCOUNTER — Ambulatory Visit
Admission: RE | Admit: 2017-09-27 | Discharge: 2017-09-27 | Disposition: A | Discharge status: Home / Self Care | Payer: Medicare HMO | Source: Ambulatory Visit | Attending: Vascular Surgery | Admitting: Vascular Surgery

## 2017-09-27 ENCOUNTER — Encounter
Admission: RE | Disposition: A | Discharge status: Home / Self Care | Payer: Self-pay | Source: Ambulatory Visit | Attending: Vascular Surgery

## 2017-09-27 DIAGNOSIS — L97519 Non-pressure chronic ulcer of other part of right foot with unspecified severity: Secondary | ICD-10-CM | POA: Insufficient documentation

## 2017-09-27 DIAGNOSIS — J449 Chronic obstructive pulmonary disease, unspecified: Secondary | ICD-10-CM | POA: Insufficient documentation

## 2017-09-27 DIAGNOSIS — K219 Gastro-esophageal reflux disease without esophagitis: Secondary | ICD-10-CM | POA: Insufficient documentation

## 2017-09-27 DIAGNOSIS — I70238 Atherosclerosis of native arteries of right leg with ulceration of other part of lower right leg: Secondary | ICD-10-CM | POA: Diagnosis not present

## 2017-09-27 DIAGNOSIS — M109 Gout, unspecified: Secondary | ICD-10-CM | POA: Insufficient documentation

## 2017-09-27 DIAGNOSIS — M1990 Unspecified osteoarthritis, unspecified site: Secondary | ICD-10-CM | POA: Insufficient documentation

## 2017-09-27 DIAGNOSIS — I70235 Atherosclerosis of native arteries of right leg with ulceration of other part of foot: Secondary | ICD-10-CM | POA: Diagnosis not present

## 2017-09-27 DIAGNOSIS — F17210 Nicotine dependence, cigarettes, uncomplicated: Secondary | ICD-10-CM | POA: Insufficient documentation

## 2017-09-27 DIAGNOSIS — I70299 Other atherosclerosis of native arteries of extremities, unspecified extremity: Secondary | ICD-10-CM

## 2017-09-27 DIAGNOSIS — I739 Peripheral vascular disease, unspecified: Secondary | ICD-10-CM

## 2017-09-27 DIAGNOSIS — I10 Essential (primary) hypertension: Secondary | ICD-10-CM | POA: Insufficient documentation

## 2017-09-27 DIAGNOSIS — Z7982 Long term (current) use of aspirin: Secondary | ICD-10-CM | POA: Insufficient documentation

## 2017-09-27 DIAGNOSIS — L97909 Non-pressure chronic ulcer of unspecified part of unspecified lower leg with unspecified severity: Secondary | ICD-10-CM

## 2017-09-27 HISTORY — PX: LOWER EXTREMITY ANGIOGRAPHY: CATH118251

## 2017-09-27 SURGERY — LOWER EXTREMITY ANGIOGRAPHY
Anesthesia: Moderate Sedation | Laterality: Right

## 2017-09-27 MED ORDER — CLOPIDOGREL BISULFATE 75 MG PO TABS
ORAL_TABLET | ORAL | Status: AC
  Administered 2017-09-27: 300 mg via ORAL
  Filled 2017-09-27: qty 4

## 2017-09-27 MED ORDER — SODIUM CHLORIDE 0.9% FLUSH: 3.0000 mL | INTRAVENOUS | Status: DC | PRN

## 2017-09-27 MED ORDER — HYDRALAZINE HCL 20 MG/ML IJ SOLN
INTRAMUSCULAR | Status: AC
  Filled 2017-09-27: qty 1

## 2017-09-27 MED ORDER — MIDAZOLAM HCL 5 MG/5ML IJ SOLN
INTRAMUSCULAR | Status: AC
  Filled 2017-09-27: qty 5

## 2017-09-27 MED ORDER — SODIUM CHLORIDE 0.9 % IV SOLN: INTRAVENOUS | Status: DC

## 2017-09-27 MED ORDER — FENTANYL CITRATE (PF) 100 MCG/2ML IJ SOLN
INTRAMUSCULAR | Status: AC
  Filled 2017-09-27: qty 2

## 2017-09-27 MED ORDER — HYDROMORPHONE HCL 1 MG/ML IJ SOLN: 1.0000 mg | Freq: Once | INTRAMUSCULAR | Status: DC | PRN

## 2017-09-27 MED ORDER — HEPARIN SODIUM (PORCINE) 1000 UNIT/ML IJ SOLN
INTRAMUSCULAR | Status: DC | PRN
  Administered 2017-09-27: 4000 [IU] via INTRAVENOUS

## 2017-09-27 MED ORDER — LABETALOL HCL 5 MG/ML IV SOLN
INTRAVENOUS | Status: AC
Start: 2017-09-27 — End: ?
  Filled 2017-09-27: qty 4

## 2017-09-27 MED ORDER — SODIUM CHLORIDE 0.9 % IV SOLN
INTRAVENOUS | Status: DC
  Administered 2017-09-27: 09:00:00 via INTRAVENOUS

## 2017-09-27 MED ORDER — METHYLPREDNISOLONE SODIUM SUCC 125 MG IJ SOLR: 125.0000 mg | INTRAMUSCULAR | Status: DC | PRN

## 2017-09-27 MED ORDER — MIDAZOLAM HCL 2 MG/2ML IJ SOLN
INTRAMUSCULAR | Status: DC | PRN
  Administered 2017-09-27: 1 mg via INTRAVENOUS
  Administered 2017-09-27 (×2): 0.5 mg via INTRAVENOUS
  Administered 2017-09-27: 2 mg via INTRAVENOUS

## 2017-09-27 MED ORDER — OXYCODONE HCL 5 MG PO TABS
5.0000 mg | ORAL_TABLET | ORAL | Status: DC | PRN
  Administered 2017-09-27: 5 mg via ORAL

## 2017-09-27 MED ORDER — HEPARIN SODIUM (PORCINE) 1000 UNIT/ML IJ SOLN
INTRAMUSCULAR | Status: AC
  Filled 2017-09-27: qty 1

## 2017-09-27 MED ORDER — HYDROCODONE-ACETAMINOPHEN 5-325 MG PO TABS: 1.0000 | ORAL_TABLET | Freq: Four times a day (QID) | ORAL | 0 refills | Status: DC | PRN

## 2017-09-27 MED ORDER — CLOPIDOGREL BISULFATE 75 MG PO TABS: 75.0000 mg | ORAL_TABLET | Freq: Every day | ORAL | 11 refills | Status: DC

## 2017-09-27 MED ORDER — SODIUM CHLORIDE 0.9% FLUSH: 3.0000 mL | Freq: Two times a day (BID) | INTRAVENOUS | Status: DC

## 2017-09-27 MED ORDER — ONDANSETRON HCL 4 MG/2ML IJ SOLN: 4.0000 mg | Freq: Four times a day (QID) | INTRAMUSCULAR | Status: DC | PRN

## 2017-09-27 MED ORDER — MORPHINE SULFATE (PF) 4 MG/ML IV SOLN: 2.0000 mg | INTRAVENOUS | Status: DC | PRN

## 2017-09-27 MED ORDER — HYDRALAZINE HCL 20 MG/ML IJ SOLN: 5.0000 mg | INTRAMUSCULAR | Status: DC | PRN

## 2017-09-27 MED ORDER — FAMOTIDINE 20 MG PO TABS: 40.0000 mg | ORAL_TABLET | ORAL | Status: DC | PRN

## 2017-09-27 MED ORDER — IPRATROPIUM-ALBUTEROL 0.5-2.5 (3) MG/3ML IN SOLN
3.0000 mL | Freq: Once | RESPIRATORY_TRACT | Status: AC
  Administered 2017-09-27: 3 mL via RESPIRATORY_TRACT

## 2017-09-27 MED ORDER — SODIUM CHLORIDE 0.9 % IV SOLN: 250.0000 mL | INTRAVENOUS | Status: DC | PRN

## 2017-09-27 MED ORDER — FENTANYL CITRATE (PF) 100 MCG/2ML IJ SOLN
INTRAMUSCULAR | Status: DC | PRN
  Administered 2017-09-27 (×2): 25 ug via INTRAVENOUS
  Administered 2017-09-27 (×2): 50 ug via INTRAVENOUS

## 2017-09-27 MED ORDER — ACETAMINOPHEN 325 MG PO TABS: 650.0000 mg | ORAL_TABLET | ORAL | Status: DC | PRN

## 2017-09-27 MED ORDER — IPRATROPIUM-ALBUTEROL 0.5-2.5 (3) MG/3ML IN SOLN
RESPIRATORY_TRACT | Status: AC
  Filled 2017-09-27: qty 3

## 2017-09-27 MED ORDER — HYDRALAZINE HCL 20 MG/ML IJ SOLN
INTRAMUSCULAR | Status: DC | PRN
  Administered 2017-09-27: 10 mg via INTRAVENOUS

## 2017-09-27 MED ORDER — CLOPIDOGREL BISULFATE 300 MG PO TABS
300.0000 mg | ORAL_TABLET | ORAL | Status: AC
Start: 2017-09-27 — End: 2017-09-27
  Administered 2017-09-27: 300 mg via ORAL

## 2017-09-27 MED ORDER — ASPIRIN EC 81 MG PO TBEC: 81.0000 mg | DELAYED_RELEASE_TABLET | Freq: Every day | ORAL | 2 refills | Status: DC

## 2017-09-27 MED ORDER — LABETALOL HCL 5 MG/ML IV SOLN
INTRAVENOUS | Status: DC | PRN
  Administered 2017-09-27: 10 mg via INTRAVENOUS

## 2017-09-27 MED ORDER — OXYCODONE HCL 5 MG PO TABS
ORAL_TABLET | ORAL | Status: AC
  Filled 2017-09-27: qty 1

## 2017-09-27 MED ORDER — LABETALOL HCL 5 MG/ML IV SOLN: 10.0000 mg | INTRAVENOUS | Status: DC | PRN

## 2017-09-27 SURGICAL SUPPLY — 40 items
BALLN LUTONIX  018 4X60X130 (BALLOONS) ×2
BALLN LUTONIX 018 4X150X130 (BALLOONS) ×3
BALLN LUTONIX 018 4X60X130 (BALLOONS) ×1
BALLN LUTONIX 018 5X220X130 (BALLOONS) ×3
BALLN LUTONIX 5X220X130 (BALLOONS) ×3
BALLN ULTRASCORE 014 4X200X150 (BALLOONS)
BALLN ULTRASCORE 4X150X130 (BALLOONS) ×3
BALLN ULTRVRSE 018 5X40X150 (BALLOONS) ×3
BALLN ULTRVRSE 3X100X150 (BALLOONS) ×3
BALLOON LUTONIX 018 4X150X130 (BALLOONS) ×1 IMPLANT
BALLOON LUTONIX 018 4X60X130 (BALLOONS) ×1 IMPLANT
BALLOON LUTONIX 018 5X220X130 (BALLOONS) ×1 IMPLANT
BALLOON LUTONIX 5X220X130 (BALLOONS) ×1 IMPLANT
BALLOON ULTRASCORE 4X150X130 (BALLOONS) ×1 IMPLANT
BALLOON ULTRSCRE 014 4X200X150 (BALLOONS) IMPLANT
BALLOON ULTRVRSE 018 5X40X150 (BALLOONS) ×1 IMPLANT
BALLOON ULTRVRSE 3X100X150 (BALLOONS) ×1 IMPLANT
CATH BEACON 5 .035 65 RIM TIP (CATHETERS) ×3 IMPLANT
CATH PIG 70CM (CATHETERS) ×3 IMPLANT
CATH VERT 5FR 125CM (CATHETERS) ×3 IMPLANT
COVER DRAPE FLUORO 36X44 (DRAPES) ×3 IMPLANT
COVER PROBE U/S 5X48 (MISCELLANEOUS) ×3 IMPLANT
DEVICE PRESTO INFLATION (MISCELLANEOUS) ×3 IMPLANT
DEVICE STARCLOSE SE CLOSURE (Vascular Products) ×3 IMPLANT
DEVICE TORQUE .025-.038 (MISCELLANEOUS) ×3 IMPLANT
GUIDEWIRE PFTE-COATED .018X300 (WIRE) ×3 IMPLANT
NEEDLE ENTRY 21GA 7CM ECHOTIP (NEEDLE) ×3 IMPLANT
PACK ANGIOGRAPHY (CUSTOM PROCEDURE TRAY) ×3 IMPLANT
SET INTRO CAPELLA COAXIAL (SET/KITS/TRAYS/PACK) ×3 IMPLANT
SHEATH ANL2 6FRX45 HC (SHEATH) ×3 IMPLANT
SHEATH BRITE TIP 5FRX11 (SHEATH) ×3 IMPLANT
SHIELD RADPAD SCOOP 12X17 (MISCELLANEOUS) ×3 IMPLANT
STENT LIFESTENT 5F 5X60X135 (Permanent Stent) ×3 IMPLANT
TOWEL OR 17X26 4PK STRL BLUE (TOWEL DISPOSABLE) ×3 IMPLANT
TUBING CONTRAST HIGH PRESS 72 (TUBING) ×3 IMPLANT
VALVE HEMO TOUHY BORST Y (ADAPTER) ×3 IMPLANT
WIRE AQUATRACK .035X260CM (WIRE) ×3 IMPLANT
WIRE G V18X300CM (WIRE) ×3 IMPLANT
WIRE J 3MM .035X145CM (WIRE) ×3 IMPLANT
WIRE SPARTACORE .014X300CM (WIRE) ×3 IMPLANT

## 2017-09-27 NOTE — Op Note (Signed)
[facility] Percutaneous Study/Intervention Procedural Note   Date of Surgery: 09/27/2017  Surgeon: Hortencia Pilar  Pre-operative Diagnosis: Atherosclerotic occlusive disease bilateral lower extremities with right lower extremity  Post-operative diagnosis: Same  Procedure(s) Performed: 1. Introduction catheter into right lower extremity 3rd order catheter placement  2. Contrast injection right lower extremity for distal runoff  3. Percutaneous transluminal angioplasty right superficial femoral and popliteal arteries to 5 mm 4. Percutaneous transluminal angioplasty and stent placement right tibioperoneal trunk and peroneal  5. Star close closure left common femoral arteriotomy  Anesthesia: Conscious sedation was administered under my direct supervision by the interventional radiology RN. IV Versed plus fentanyl were utilized. Continuous ECG, pulse oximetry and blood pressure was monitored throughout the entire procedure.  Conscious sedation was for a total of 2 hour 17 minutes.  Sheath: 6 [place] Ansell  Contrast: 98 cc  Fluoroscopy Time: 13.5 minutes  Indications: BATOOL MAJID presents with increasing pain of the right lower extremity.  This is associated with ulceration of her toe.  This suggests the patient is having limb threatening ischemia. The risks and benefits are reviewed all questions answered patient agrees to proceed.  Procedure:Dainelle JACINTA PENALVER is a 62 y.o. y.o. female who was identified and appropriate procedural time out was performed. The patient was then placed supine on the table and prepped and draped in the usual sterile fashion.   Ultrasound was placed in the sterile sleeve and the left groin was evaluated the left common femoral artery was echolucent and pulsatile indicating patency. Image was recorded for the permanent record and under real-time visualization a  microneedle was inserted into the common femoral artery followed by the microwire and then the micro-sheath. A J-wire was then advanced through the micro-sheath and a 5 [place] sheath was then inserted over a J-wire. J-wire was then advanced and a 5 French pigtail catheter was positioned at the level of T12.  AP projection of the aorta was then obtained. Pigtail catheter was repositioned to above the bifurcation and a LAO view of the pelvis was obtained. Subsequently a pigtail catheter with the stiff angle Glidewire was used to cross the aortic bifurcation the catheter wire were advanced down into the right distal external iliac artery. Oblique view of the femoral bifurcation was then obtained and subsequently the wire was reintroduced and the pigtail catheter negotiated into the SFA representing third order catheter placement. Distal runoff was then performed.  5000 units of heparin was then given and allowed to circulate and a 6 [place] Ansell sheath was advanced up and over the bifurcation and positioned in the femoral artery  Straight catheter and stiff angle Glidewire were then negotiated down into the distal popliteal. Catheter was then advanced. Hand injection contrast demonstrated the tibial anatomy in detail.  The trifurcation is heavily diseased and there is occlusion of the anterior tibial and posterior tibial throughout the majority of their course.  I was able to negotiate the wire through the multiple greater than 90% stenoses within the tibioperoneal trunk and peroneal.  Initially a 3 mm x 12 cm Ultraverse balloon was used to angioplasty the peroneal proximally and the tibioperoneal trunk.  Subsequently, a 4 mm Lutonix balloon was used to treat this area.  Follow-up imaging demonstrated greater than 40% residual stenosis and a 5 mm x 60 mm life stent was deployed predominantly across the tibioperoneal trunk and postdilated with a 5 mm Lutonix balloon.  Each inflation was for 2 minutes at 12 atm.  Follow-up  imaging demonstrated excellent patency with less than 10% with preservation of the distal runoff.  The detector was then repositioned and the SFA and popliteal was reimaged multiple greater than 80% stenoses are noted throughout the SFA and above-knee popliteal.  Initially, I treated the SFA with a 4 mm x 20 cm ultra score balloon inflations were to 12 atm for 1 minute.  Following this 5 mm x 22 centimeter Lutonix drug-eluting balloons were utilized to treat the SFA and above-knee popliteal.  Inflations were to 12 atm for 2 minutes.  Follow-up imaging demonstrated patency with excellent result and less than 10% residual stenosis throughout the SFA and popliteal. Distal runoff was then reassessed and noted to be widely patent.   After review of these images the sheath is pulled into the left external iliac oblique of the common femoral is obtained and a Star close device deployed. There no immediate Complications.  Findings: The abdominal aorta is opacified with a bolus injection contrast. Renal arteries are single and patent. The aorta itself has diffuse disease but no hemodynamically significant lesions. The common and external iliac arteries are widely patent bilaterally.  The right common femoral is widely patent as is the profunda femoris.  The SFA does indeed have a significant stenosis throughout the majority of its course with multiple greater than 80% stenoses this pattern extends into the above-knee popliteal as well.  The distal popliteal demonstrates nonhemodynamically significant disease and the trifurcation is heavily diseased with occlusion of the anterior tibial and posterior tibial arteries throughout the majority of their course.  Peroneal artery and the tibioperoneal trunk demonstrates an occlusion associated with tandem greater than 70% lesions within the peroneal itself.  Following angioplasty peroneal and tibioperoneal trunk has greater than 40% residual stenosis and a life  stent is deployed and postdilated to 5 mm this yields an excellent result with less than 10% residual stenosis the peroneal is now in-line flow and looks quite nice. Angioplasty of the SFA and popliteal yields an excellent result with less than 10% residual stenosis.  Summary: Successful recanalization right lower extremity for limb salvage   Disposition: Patient was taken to the recovery room in stable condition having tolerated the procedure well.  Belenda Cruise Elpidio Thielen 09/27/2017,6:09 PM

## 2017-09-27 NOTE — H&P (Signed)
[facility] History & Physical Update  The patient was interviewed and re-examined.  The patient's previous History and Physical has been reviewed and is unchanged.  There is no change in the plan of care. We plan to proceed with the scheduled procedure.  Hortencia Pilar, MD  09/27/2017, 10:51 AM

## 2017-09-28 ENCOUNTER — Encounter: Payer: Self-pay | Admitting: Vascular Surgery

## 2017-10-18 ENCOUNTER — Ambulatory Visit
Admission: RE | Admit: 2017-10-18 | Discharge: 2017-10-18 | Disposition: A | Discharge status: Home / Self Care | Payer: Medicare HMO | Source: Ambulatory Visit | Attending: Vascular Surgery | Admitting: Vascular Surgery

## 2017-10-18 DIAGNOSIS — I739 Peripheral vascular disease, unspecified: Secondary | ICD-10-CM | POA: Insufficient documentation

## 2017-10-20 ENCOUNTER — Ambulatory Visit (INDEPENDENT_AMBULATORY_CARE_PROVIDER_SITE_OTHER): Payer: Medicare HMO | Admitting: Vascular Surgery

## 2017-10-20 ENCOUNTER — Encounter (INDEPENDENT_AMBULATORY_CARE_PROVIDER_SITE_OTHER): Payer: Self-pay | Admitting: Vascular Surgery

## 2017-10-20 VITALS — BP 147/77 | HR 80 | Resp 16 | Ht 66.0 in | Wt 220.0 lb

## 2017-10-20 DIAGNOSIS — L97519 Non-pressure chronic ulcer of other part of right foot with unspecified severity: Secondary | ICD-10-CM

## 2017-10-20 DIAGNOSIS — I739 Peripheral vascular disease, unspecified: Secondary | ICD-10-CM | POA: Diagnosis not present

## 2017-10-20 DIAGNOSIS — I10 Essential (primary) hypertension: Secondary | ICD-10-CM | POA: Diagnosis not present

## 2017-10-20 NOTE — Progress Notes (Signed)
MRN : [id number]  Jackie Macias is a 62 y.o. (11/02/1955) female who presents with chief complaint of  Chief Complaint  Patient presents with  . Follow-up    2-3 week ABI results f/u  .  History of Present Illness:   The patient returns to the office for followup s/p angiogram.  Procedure(s) Performed 09/27/2017: 1. Introduction catheter into right lower extremity 3rd order catheter placement  2. Contrast injection right lower extremity for distal runoff  3. Percutaneous transluminal angioplasty right superficial femoral and popliteal arteries to 5 mm 4. Percutaneous transluminal angioplasty and stent placement right tibioperoneal trunk and peroneal  5. Star close closure left common femoral arteriotomy   There has been a significant deterioration in the lower extremity symptoms.  The patient notes interval shortening of their claudication distance and development of mild rest pain symptoms. No new ulcers or wounds have occurred since the last visit.  There have been no significant changes to the patient's overall health care.  The patient denies amaurosis fugax or recent TIA symptoms. There are no recent neurological changes noted. The patient denies history of DVT, PE or superficial thrombophlebitis. The patient denies recent episodes of angina or shortness of breath.     Current Meds  Medication Sig  . amLODipine (NORVASC) 10 MG tablet Take 10 mg by mouth daily.  Marland Kitchen aspirin EC 81 MG tablet Take 1 tablet (81 mg total) by mouth daily.  . Aspirin-Caffeine (BC FAST PAIN RELIEF PO) Take 1 packet by mouth daily as needed (pain).  Marland Kitchen clopidogrel (PLAVIX) 75 MG tablet Take 1 tablet (75 mg total) by mouth daily.  . cyclobenzaprine (FLEXERIL) 10 MG tablet Take 10 mg by mouth daily.   Marland Kitchen docusate sodium (COLACE) 100 MG capsule Take 1 capsule (100 mg total) by mouth 2 (two) times daily.  . furosemide (LASIX) 20 MG tablet  Take 20 mg by mouth daily.  Marland Kitchen HYDROcodone-acetaminophen (NORCO) 5-325 MG tablet Take 1-2 tablets by mouth every 6 (six) hours as needed for moderate pain or severe pain.  . naproxen (NAPROSYN) 500 MG tablet Take 1 tablet (500 mg total) by mouth 2 (two) times daily with a meal.  . oxyCODONE (ROXICODONE) 5 MG immediate release tablet Take 1 tablet (5 mg total) by mouth every 4 (four) hours as needed.  . ranitidine (ZANTAC) 150 MG tablet Take 150 mg by mouth daily.  Marland Kitchen senna (SENOKOT) 8.6 MG TABS tablet Take 1 tablet (8.6 mg total) by mouth 2 (two) times daily.    Past Medical History:  Diagnosis Date  . Arthritis   . Bronchitis   . COPD (chronic obstructive pulmonary disease) ([place])   . GERD (gastroesophageal reflux disease)   . Gout   . Hypertension     Past Surgical History:  Procedure Laterality Date  . AMPUTATION Right 07/29/2017   Procedure: AMPUTATION DIGIT/ 41962;  Surgeon: Sharlotte Alamo, DPM;  Location: [facility];  Service: Podiatry;  Laterality: Right;  . CHOLECYSTECTOMY    . facial biopsy Right   . JOINT REPLACEMENT Right 12/2016   THR  . LOWER EXTREMITY ANGIOGRAPHY Right 09/27/2017   Procedure: LOWER EXTREMITY ANGIOGRAPHY;  Surgeon: Katha Cabal, MD;  Location: [place] [facility];  Service: Cardiovascular;  Laterality: Right;  . TONSILLECTOMY    . TOTAL HIP ARTHROPLASTY Right 01/17/2017   Procedure: TOTAL HIP ARTHROPLASTY ANTERIOR APPROACH;  Surgeon: Lovell Sheehan, MD;  Location: [facility];  Service: Orthopedics;  Laterality: Right;    Social History  Social History   Tobacco Use  . Smoking status: Current Every Day Smoker    Packs/day: 0.50    Types: Cigarettes  . Smokeless tobacco: Former Network engineer Use Topics  . Alcohol use: Yes    Alcohol/week: 1.8 oz    Types: 3 Cans of beer per week    Comment: occasional  . Drug use: No    Family History Family History  Problem Relation Age of Onset  . Breast cancer Sister 23  . Breast cancer Other     No  Known Allergies   REVIEW OF SYSTEMS (Negative unless checked)  Constitutional: [] Weight loss  [] Fever  [] Chills Cardiac: [] Chest pain   [] Chest pressure   [] Palpitations   [] Shortness of breath when laying flat   [] Shortness of breath with exertion. Vascular:  [x] Pain in legs with walking   [x] Pain in legs at rest  [] History of DVT   [] Phlebitis   [] Swelling in legs   [] Varicose veins   [] Non-healing ulcers Pulmonary:   [] Uses home oxygen   [] Productive cough   [] Hemoptysis   [] Wheeze  [] COPD   [] Asthma Neurologic:  [] Dizziness   [] Seizures   [] History of stroke   [] History of TIA  [] Aphasia   [] Vissual changes   [] Weakness or numbness in arm   [] Weakness or numbness in leg Musculoskeletal:   [] Joint swelling   [] Joint pain   [] Low back pain Hematologic:  [] Easy bruising  [] Easy bleeding   [] Hypercoagulable state   [] Anemic Gastrointestinal:  [] Diarrhea   [] Vomiting  [] Gastroesophageal reflux/heartburn   [] Difficulty swallowing. Genitourinary:  [] Chronic kidney disease   [] Difficult urination  [] Frequent urination   [] Blood in urine Skin:  [] Rashes   [] Ulcers  Psychological:  [] History of anxiety   []  History of major depression.  Physical Examination  Vitals:   10/20/17 1407  BP: (!) 147/77  Pulse: 80  Resp: 16  Weight: 220 lb (99.8 kg)  Height: 5\' 6"  (1.676 m)   Body mass index is 35.51 kg/m. Gen: WD/WN, NAD Head: [place]/AT, No temporalis wasting.  Ear/Nose/Throat: Hearing grossly intact, nares w/o erythema or drainage Eyes: PER, EOMI, sclera nonicteric.  Neck: Supple, no large masses.   Pulmonary:  Good air movement, no audible wheezing bilaterally, no use of accessory muscles.  Cardiac: RRR, no JVD Vascular:  Right foot ulcer  Vessel Right Left  Radial Palpable Palpable  PT Not Palpable Not Palpable  DP Not Palpable Not Palpable  Gastrointestinal: Non-distended. No guarding/no peritoneal signs.  Musculoskeletal: M/S 5/5 throughout.  No deformity or atrophy.  Neurologic: CN  2-12 intact. Symmetrical.  Speech is fluent. Motor exam as listed above. Psychiatric: Judgment intact, Mood & affect appropriate for pt's clinical situation. Dermatologic: No rashes or ulcers noted.  No changes consistent with cellulitis. Lymph : No lichenification or skin changes of chronic lymphedema.  CBC Lab Results  Component Value Date   WBC 5.6 07/28/2017   HGB 12.7 07/28/2017   HCT 36.6 07/28/2017   MCV 99.1 07/28/2017   PLT 334 07/28/2017    BMET    Component Value Date/Time   NA 133 (L) 07/28/2017 0817   K 3.5 07/28/2017 0817   CL 101 07/28/2017 0817   CO2 25 07/28/2017 0817   GLUCOSE 96 07/28/2017 0817   BUN 21 09/26/2017 0915   CREATININE 0.67 09/26/2017 0915   CALCIUM 9.1 07/28/2017 0817   GFRNONAA >60 09/26/2017 0915   GFRAA >60 09/26/2017 0915   CrCl cannot be calculated (Patient's most recent lab  result is older than the maximum 21 days allowed.).  COAG Lab Results  Component Value Date   INR 1.04 01/05/2017    Radiology US Arterial Abi (screening Lower Extremity)  Result Date: 10/18/2017 EXAM: NONINVASIVE PHYSIOLOGIC VASCULAR STUDY OF BILATERAL LOWER EXTREMITIES TECHNIQUE: Evaluation of both lower extremities was performed at rest, including calculation of ankle-brachial indices, multiple segmental pressure evaluation, segmental Doppler and segmental pulse volume recording. COMPARISON:  None. FINDINGS: Right ABI: Left ABI: Right Lower Extremity: Left Lower Extremity: Electronically Signed   By: Corrie Mckusick D.O.   On: 10/18/2017 15:13      Assessment/Plan 1. Ulcer of toe of right foot, unspecified ulcer stage ([place]) Recommend:  Patient should undergo arterial duplex of the lower extremity ASAP because there has been a significant deterioration in the patient's lower extremity symptoms.  The patient states they are having increased pain and a marked decrease in the distance that they can walk.  The risks and benefits as well as the alternatives were  discussed in detail with the patient.  All questions were answered.  Patient agrees to proceed and understands this could be a prelude to angiography and intervention.  The patient will follow up with me in the office to review the studies.   2. PAD (peripheral artery disease) ([place]) Recommend:  Patient should undergo arterial duplex of the lower extremity ASAP because there has been a significant deterioration in the patient's lower extremity symptoms.  The patient states they are having increased pain and a marked decrease in the distance that they can walk.  The risks and benefits as well as the alternatives were discussed in detail with the patient.  All questions were answered.  Patient agrees to proceed and understands this could be a prelude to angiography and intervention.  The patient will follow up with me in the office to review the studies.   3. Essential hypertension Continue antihypertensive medications as already ordered, these medications have been reviewed and there are no changes at this time.     Hortencia Pilar, MD  10/20/2017 2:14 PM

## 2017-10-23 ENCOUNTER — Encounter (INDEPENDENT_AMBULATORY_CARE_PROVIDER_SITE_OTHER): Payer: Self-pay | Admitting: Vascular Surgery

## 2017-10-24 ENCOUNTER — Encounter (INDEPENDENT_AMBULATORY_CARE_PROVIDER_SITE_OTHER): Payer: Medicare HMO

## 2017-10-24 ENCOUNTER — Telehealth (INDEPENDENT_AMBULATORY_CARE_PROVIDER_SITE_OTHER): Payer: Self-pay

## 2017-10-24 ENCOUNTER — Other Ambulatory Visit (INDEPENDENT_AMBULATORY_CARE_PROVIDER_SITE_OTHER): Payer: Self-pay | Admitting: Vascular Surgery

## 2017-10-24 DIAGNOSIS — M79604 Pain in right leg: Secondary | ICD-10-CM

## 2017-10-24 DIAGNOSIS — Z95820 Peripheral vascular angioplasty status with implants and grafts: Secondary | ICD-10-CM

## 2017-10-24 DIAGNOSIS — I739 Peripheral vascular disease, unspecified: Secondary | ICD-10-CM

## 2017-10-24 NOTE — Telephone Encounter (Signed)
Patient called and stated that she will not be able to make her 3 p.m. Ultrasound for today.   She states that she will call back at a later time to reschedule that appointment.

## 2017-12-01 ENCOUNTER — Ambulatory Visit (INDEPENDENT_AMBULATORY_CARE_PROVIDER_SITE_OTHER): Payer: Medicare HMO

## 2017-12-01 ENCOUNTER — Ambulatory Visit (INDEPENDENT_AMBULATORY_CARE_PROVIDER_SITE_OTHER): Payer: Medicare HMO | Admitting: Vascular Surgery

## 2017-12-01 ENCOUNTER — Encounter (INDEPENDENT_AMBULATORY_CARE_PROVIDER_SITE_OTHER): Payer: Self-pay | Admitting: Vascular Surgery

## 2017-12-01 VITALS — BP 170/82 | HR 84 | Resp 16 | Ht 68.0 in | Wt 227.0 lb

## 2017-12-01 DIAGNOSIS — L97519 Non-pressure chronic ulcer of other part of right foot with unspecified severity: Secondary | ICD-10-CM

## 2017-12-01 DIAGNOSIS — I739 Peripheral vascular disease, unspecified: Secondary | ICD-10-CM

## 2017-12-01 DIAGNOSIS — I10 Essential (primary) hypertension: Secondary | ICD-10-CM

## 2017-12-01 DIAGNOSIS — M79604 Pain in right leg: Secondary | ICD-10-CM

## 2017-12-01 DIAGNOSIS — Z95820 Peripheral vascular angioplasty status with implants and grafts: Secondary | ICD-10-CM

## 2017-12-04 ENCOUNTER — Encounter (INDEPENDENT_AMBULATORY_CARE_PROVIDER_SITE_OTHER): Payer: Self-pay | Admitting: Vascular Surgery

## 2017-12-04 NOTE — Progress Notes (Signed)
MRN : [id number]  Jackie Macias is a 62 y.o. (03/27/1956) female who presents with chief complaint of  Chief Complaint  Patient presents with  . Follow-up    Arterial follow up  .  History of Present Illness:   The patient returns to the office for followup and review of the noninvasive studies. There have been no interval changes in lower extremity symptoms. No interval shortening of the patient's claudication distance or development of rest pain symptoms. No new ulcers or wounds have occurred since the last visitbut her ulcer has not yet healed  There have been no significant changes to the patient's overall health care.  The patient denies amaurosis fugax or recent TIA symptoms. There are no recent neurological changes noted. The patient denies history of DVT, PE or superficial thrombophlebitis. The patient denies recent episodes of angina or shortness of breath.   Duplex ultrasound of the right leg shows monophasic signals in all three tibial vessels  Current Meds  Medication Sig  . amLODipine (NORVASC) 10 MG tablet Take 10 mg by mouth daily.  Marland Kitchen aspirin EC 81 MG tablet Take 1 tablet (81 mg total) by mouth daily.  . clopidogrel (PLAVIX) 75 MG tablet Take 1 tablet (75 mg total) by mouth daily.  . cyclobenzaprine (FLEXERIL) 10 MG tablet Take 10 mg by mouth daily.   Marland Kitchen docusate sodium (COLACE) 100 MG capsule Take 1 capsule (100 mg total) by mouth 2 (two) times daily.  . furosemide (LASIX) 20 MG tablet Take 20 mg by mouth daily.  Marland Kitchen HYDROcodone-acetaminophen (NORCO) 5-325 MG tablet Take 1-2 tablets by mouth every 6 (six) hours as needed for moderate pain or severe pain.  . ranitidine (ZANTAC) 150 MG tablet Take 150 mg by mouth daily.  Marland Kitchen senna (SENOKOT) 8.6 MG TABS tablet Take 1 tablet (8.6 mg total) by mouth 2 (two) times daily.  . traZODone (DESYREL) 100 MG tablet Take by mouth.    Past Medical History:  Diagnosis Date  . Arthritis   . Bronchitis   . COPD (chronic  obstructive pulmonary disease) ([place])   . GERD (gastroesophageal reflux disease)   . Gout   . Hypertension     Past Surgical History:  Procedure Laterality Date  . AMPUTATION Right 07/29/2017   Procedure: AMPUTATION DIGIT/ 18299;  Surgeon: Sharlotte Alamo, DPM;  Location: [facility];  Service: Podiatry;  Laterality: Right;  . CHOLECYSTECTOMY    . facial biopsy Right   . JOINT REPLACEMENT Right 12/2016   THR  . LOWER EXTREMITY ANGIOGRAPHY Right 09/27/2017   Procedure: LOWER EXTREMITY ANGIOGRAPHY;  Surgeon: Katha Cabal, MD;  Location: [place] [facility];  Service: Cardiovascular;  Laterality: Right;  . TONSILLECTOMY    . TOTAL HIP ARTHROPLASTY Right 01/17/2017   Procedure: TOTAL HIP ARTHROPLASTY ANTERIOR APPROACH;  Surgeon: Lovell Sheehan, MD;  Location: [facility];  Service: Orthopedics;  Laterality: Right;    Social History Social History   Tobacco Use  . Smoking status: Current Every Day Smoker    Packs/day: 0.50    Types: Cigarettes  . Smokeless tobacco: Former Network engineer Use Topics  . Alcohol use: Yes    Alcohol/week: 3.0 standard drinks    Types: 3 Cans of beer per week    Comment: occasional  . Drug use: No    Family History Family History  Problem Relation Age of Onset  . Breast cancer Sister 22  . Breast cancer Other     No Known Allergies  REVIEW OF SYSTEMS (Negative unless checked)  Constitutional: [] Weight loss  [] Fever  [] Chills Cardiac: [] Chest pain   [] Chest pressure   [] Palpitations   [] Shortness of breath when laying flat   [] Shortness of breath with exertion. Vascular:  [x] Pain in legs with walking   [] Pain in legs at rest  [] History of DVT   [] Phlebitis   [] Swelling in legs   [] Varicose veins   [x] Non-healing ulcers Pulmonary:   [] Uses home oxygen   [] Productive cough   [] Hemoptysis   [] Wheeze  [] COPD   [] Asthma Neurologic:  [] Dizziness   [] Seizures   [] History of stroke   [] History of TIA  [] Aphasia   [] Vissual changes   [] Weakness or numbness  in arm   [] Weakness or numbness in leg Musculoskeletal:   [] Joint swelling   [] Joint pain   [] Low back pain Hematologic:  [] Easy bruising  [] Easy bleeding   [] Hypercoagulable state   [] Anemic Gastrointestinal:  [] Diarrhea   [] Vomiting  [] Gastroesophageal reflux/heartburn   [] Difficulty swallowing. Genitourinary:  [] Chronic kidney disease   [] Difficult urination  [] Frequent urination   [] Blood in urine Skin:  [] Rashes   [x] Ulcers  Psychological:  [] History of anxiety   []  History of major depression.  Physical Examination  Vitals:   12/01/17 1117  BP: (!) 170/82  Pulse: 84  Resp: 16  Weight: 227 lb (103 kg)  Height: 5\' 8"  (1.727 m)   Body mass index is 34.52 kg/m. Gen: WD/WN, NAD Head: [place]/AT, No temporalis wasting.  Ear/Nose/Throat: Hearing grossly intact, nares w/o erythema or drainage Eyes: PER, EOMI, sclera nonicteric.  Neck: Supple, no large masses.   Pulmonary:  Good air movement, no audible wheezing bilaterally, no use of accessory muscles.  Cardiac: RRR, no JVD Vascular:  Right toe ulcer is not infected Vessel Right Left  Radial Palpable Palpable  PT Not Palpable Not Palpable  DP Not Palpable Not Palpable  Gastrointestinal: Non-distended. No guarding/no peritoneal signs.  Musculoskeletal: M/S 5/5 throughout.  No deformity or atrophy.  Neurologic: CN 2-12 intact. Symmetrical.  Speech is fluent. Motor exam as listed above. Psychiatric: Judgment intact, Mood & affect appropriate for pt's clinical situation. Dermatologic: No rashes or ulcers noted.  No changes consistent with cellulitis. Lymph : No lichenification or skin changes of chronic lymphedema.  CBC Lab Results  Component Value Date   WBC 5.6 07/28/2017   HGB 12.7 07/28/2017   HCT 36.6 07/28/2017   MCV 99.1 07/28/2017   PLT 334 07/28/2017    BMET    Component Value Date/Time   NA 133 (L) 07/28/2017 0817   K 3.5 07/28/2017 0817   CL 101 07/28/2017 0817   CO2 25 07/28/2017 0817   GLUCOSE 96 07/28/2017  0817   BUN 21 09/26/2017 0915   CREATININE 0.67 09/26/2017 0915   CALCIUM 9.1 07/28/2017 0817   GFRNONAA >60 09/26/2017 0915   GFRAA >60 09/26/2017 0915   CrCl cannot be calculated (Patient's most recent lab result is older than the maximum 21 days allowed.).  COAG Lab Results  Component Value Date   INR 1.04 01/05/2017    Radiology No results found.  Assessment/Plan 1. PAD (peripheral artery disease) ([facility]) Recommend:  The patient is status post successful angiogram with intervention.  The patient reports that the claudication symptoms and leg pain are unchanged and continue to be a major issue.   The patient continues to have an open wound and the noninvasive studies show monophasic flow in the tibial vessels.  I am not sure that this  will provided adequate perfusion for healing and so will follow her in just one month at which time if she has not healed follow up angiogram will be discussed.  Patient should undergo noninvasive studies as ordered. The patient will follow up with me after the studies.   In the mean time the patient should continue walking and an exercise program.  The patient should continue antiplatelet therapy and aggressive treatment of the lipid abnormalities  Smoking cessation was again discussed  The patient should continue wearing graduated compression socks 10-15 mmHg strength to control the mild edema.  - VAS [place] ABI WITH/WO TBI; Future  2. Ulcer of toe of right foot, unspecified ulcer stage ([place]) See #1  3. Essential hypertension Continue antihypertensive medications as already ordered, these medications have been reviewed and there are no changes at this time.     Hortencia Pilar, MD  12/04/2017 1:45 PM

## 2017-12-22 ENCOUNTER — Encounter

## 2017-12-22 ENCOUNTER — Encounter (INDEPENDENT_AMBULATORY_CARE_PROVIDER_SITE_OTHER): Payer: Medicare HMO

## 2018-01-09 ENCOUNTER — Encounter (INDEPENDENT_AMBULATORY_CARE_PROVIDER_SITE_OTHER): Payer: Medicare HMO

## 2018-01-09 ENCOUNTER — Ambulatory Visit (INDEPENDENT_AMBULATORY_CARE_PROVIDER_SITE_OTHER): Payer: Medicare HMO | Admitting: Vascular Surgery

## 2018-03-20 DIAGNOSIS — M109 Gout, unspecified: Secondary | ICD-10-CM | POA: Insufficient documentation

## 2018-04-28 ENCOUNTER — Other Ambulatory Visit: Payer: Self-pay | Admitting: Otolaryngology

## 2018-04-28 DIAGNOSIS — R221 Localized swelling, mass and lump, neck: Secondary | ICD-10-CM

## 2018-05-09 ENCOUNTER — Ambulatory Visit
Admission: RE | Admit: 2018-05-09 | Discharge: 2018-05-09 | Disposition: A | Discharge status: Home / Self Care | Payer: Medicare HMO | Source: Ambulatory Visit | Attending: Otolaryngology | Admitting: Otolaryngology

## 2018-05-09 DIAGNOSIS — R221 Localized swelling, mass and lump, neck: Secondary | ICD-10-CM | POA: Diagnosis not present

## 2018-05-09 LAB — POCT I-STAT CREATININE: Creatinine, Ser: 0.7 mg/dL (ref 0.44–1.00)

## 2018-05-09 MED ORDER — IOPAMIDOL (ISOVUE-300) INJECTION 61%
75.0000 mL | Freq: Once | INTRAVENOUS | Status: AC | PRN
  Administered 2018-05-09: 75 mL via INTRAVENOUS

## 2018-05-26 DIAGNOSIS — E782 Mixed hyperlipidemia: Secondary | ICD-10-CM | POA: Insufficient documentation

## 2018-06-01 ENCOUNTER — Ambulatory Visit (INDEPENDENT_AMBULATORY_CARE_PROVIDER_SITE_OTHER): Payer: Medicare HMO | Admitting: Vascular Surgery

## 2018-08-03 ENCOUNTER — Ambulatory Visit (INDEPENDENT_AMBULATORY_CARE_PROVIDER_SITE_OTHER): Payer: Medicare HMO | Admitting: Vascular Surgery

## 2018-08-07 ENCOUNTER — Ambulatory Visit (INDEPENDENT_AMBULATORY_CARE_PROVIDER_SITE_OTHER): Payer: Medicare HMO | Admitting: Vascular Surgery

## 2018-09-15 ENCOUNTER — Emergency Department: Payer: Medicare HMO

## 2018-09-15 ENCOUNTER — Encounter: Payer: Self-pay | Admitting: Emergency Medicine

## 2018-09-15 ENCOUNTER — Other Ambulatory Visit: Payer: Self-pay

## 2018-09-15 ENCOUNTER — Emergency Department
Admission: EM | Admit: 2018-09-15 | Discharge: 2018-09-15 | Disposition: A | Discharge status: Home / Self Care | Payer: Medicare HMO | Attending: Emergency Medicine | Admitting: Emergency Medicine

## 2018-09-15 DIAGNOSIS — J449 Chronic obstructive pulmonary disease, unspecified: Secondary | ICD-10-CM | POA: Diagnosis not present

## 2018-09-15 DIAGNOSIS — R202 Paresthesia of skin: Secondary | ICD-10-CM | POA: Diagnosis not present

## 2018-09-15 DIAGNOSIS — Z7982 Long term (current) use of aspirin: Secondary | ICD-10-CM | POA: Insufficient documentation

## 2018-09-15 DIAGNOSIS — Z96641 Presence of right artificial hip joint: Secondary | ICD-10-CM | POA: Insufficient documentation

## 2018-09-15 DIAGNOSIS — F17210 Nicotine dependence, cigarettes, uncomplicated: Secondary | ICD-10-CM | POA: Diagnosis not present

## 2018-09-15 DIAGNOSIS — W010XXA Fall on same level from slipping, tripping and stumbling without subsequent striking against object, initial encounter: Secondary | ICD-10-CM | POA: Diagnosis not present

## 2018-09-15 DIAGNOSIS — R05 Cough: Secondary | ICD-10-CM | POA: Diagnosis not present

## 2018-09-15 DIAGNOSIS — I10 Essential (primary) hypertension: Secondary | ICD-10-CM | POA: Insufficient documentation

## 2018-09-15 DIAGNOSIS — Z79899 Other long term (current) drug therapy: Secondary | ICD-10-CM | POA: Diagnosis not present

## 2018-09-15 DIAGNOSIS — M542 Cervicalgia: Secondary | ICD-10-CM | POA: Insufficient documentation

## 2018-09-15 DIAGNOSIS — W19XXXA Unspecified fall, initial encounter: Secondary | ICD-10-CM

## 2018-09-15 LAB — CBC WITH DIFFERENTIAL/PLATELET
Abs Immature Granulocytes: 0.02 10*3/uL (ref 0.00–0.07)
Basophils Absolute: 0 10*3/uL (ref 0.0–0.1)
Basophils Relative: 0 %
Eosinophils Absolute: 0.1 10*3/uL (ref 0.0–0.5)
Eosinophils Relative: 2 %
HCT: 37.8 % (ref 36.0–46.0)
Hemoglobin: 12.5 g/dL (ref 12.0–15.0)
Immature Granulocytes: 0 %
Lymphocytes Relative: 38 %
Lymphs Abs: 1.7 10*3/uL (ref 0.7–4.0)
MCH: 33.9 pg (ref 26.0–34.0)
MCHC: 33.1 g/dL (ref 30.0–36.0)
MCV: 102.4 fL — ABNORMAL HIGH (ref 80.0–100.0)
Monocytes Absolute: 0.7 10*3/uL (ref 0.1–1.0)
Monocytes Relative: 16 %
Neutro Abs: 2 10*3/uL (ref 1.7–7.7)
Neutrophils Relative %: 44 %
Platelets: 259 10*3/uL (ref 150–400)
RBC: 3.69 MIL/uL — ABNORMAL LOW (ref 3.87–5.11)
RDW: 14.7 % (ref 11.5–15.5)
WBC: 4.6 10*3/uL (ref 4.0–10.5)
nRBC: 0 % (ref 0.0–0.2)

## 2018-09-15 LAB — COMPREHENSIVE METABOLIC PANEL
ALT: 14 U/L (ref 0–44)
AST: 24 U/L (ref 15–41)
Albumin: 3.9 g/dL (ref 3.5–5.0)
Alkaline Phosphatase: 89 U/L (ref 38–126)
Anion gap: 9 (ref 5–15)
BUN: 20 mg/dL (ref 8–23)
CO2: 26 mmol/L (ref 22–32)
Calcium: 9.6 mg/dL (ref 8.9–10.3)
Chloride: 104 mmol/L (ref 98–111)
Creatinine, Ser: 0.97 mg/dL (ref 0.44–1.00)
GFR calc Af Amer: >60 mL/min (ref >60)
GFR calc non Af Amer: >60 mL/min (ref >60)
Glucose, Bld: 125 mg/dL — ABNORMAL HIGH (ref 70–99)
Potassium: 4 mmol/L (ref 3.5–5.1)
Sodium: 139 mmol/L (ref 135–145)
Total Bilirubin: 0.6 mg/dL (ref 0.3–1.2)
Total Protein: 7.8 g/dL (ref 6.5–8.1)

## 2018-09-15 LAB — TROPONIN I: Troponin I: 0.06 ng/mL (ref <0.03)

## 2018-09-15 MED ORDER — METHOCARBAMOL 500 MG PO TABS: 500.0000 mg | ORAL_TABLET | Freq: Three times a day (TID) | ORAL | 0 refills | Status: AC | PRN

## 2018-09-15 MED ORDER — CYCLOBENZAPRINE HCL 10 MG PO TABS
5.0000 mg | ORAL_TABLET | Freq: Once | ORAL | Status: AC
  Administered 2018-09-15: 5 mg via ORAL
  Filled 2018-09-15: qty 1

## 2018-09-15 MED ORDER — PREDNISONE 10 MG (21) PO TBPK: ORAL_TABLET | ORAL | 0 refills | Status: DC

## 2018-09-15 MED ORDER — PREDNISONE 20 MG PO TABS
60.0000 mg | ORAL_TABLET | Freq: Once | ORAL | Status: AC
  Administered 2018-09-15: 60 mg via ORAL
  Filled 2018-09-15: qty 3

## 2018-09-15 MED ORDER — METHOCARBAMOL 500 MG PO TABS: 500.0000 mg | ORAL_TABLET | Freq: Three times a day (TID) | ORAL | 0 refills | Status: DC | PRN

## 2018-09-15 NOTE — ED Triage Notes (Signed)
Pt to ED via POV c/o fall 2 days ago. Pt states that she is having pain in her neck and pain in both hands. Pt states that it feels like pins and needles. Pt is in NAD.

## 2018-09-15 NOTE — ED Provider Notes (Signed)
[facility] Provider Note  ____________________________________________  Time seen: Approximately 5:53 PM  I have reviewed the triage vital signs and the nursing notes.   HISTORY  Chief Complaint Fall    HPI Jackie Macias is a 63 y.o. female presents to the emergency department after a fall that occurred 2 days ago.  Patient reports that she was walking around her vehicle when her left leg hyperextended and she lost her balance.  Patient states that her son helped her up and she noticed some pain along the left side of her neck and some tingling in her hands.  Patient states that the pain is persist despite taking aspirin.  She denies weakness of the left upper extremity.  No chest pain, chest tightness or shortness of breath.  Patient does not recall hitting her head during the injury.  She has been able to ambulate but is concerned as pain has not improved since fall.  No other alleviating measures have been attempted.        Past Medical History:  Diagnosis Date  . Arthritis   . Bronchitis   . COPD (chronic obstructive pulmonary disease) ([place])   . GERD (gastroesophageal reflux disease)   . Gout   . Hypertension     Patient Active Problem List   Diagnosis Date Noted  . Essential hypertension 09/08/2017  . PAD (peripheral artery disease) ([place]) 09/08/2017  . Ulcer of toe of right foot ([place]) 09/08/2017  . Status post THR (total hip replacement) 01/17/2017    Past Surgical History:  Procedure Laterality Date  . AMPUTATION Right 07/29/2017   Procedure: AMPUTATION DIGIT/ 84166;  Surgeon: Sharlotte Alamo, DPM;  Location: [facility] ORS;  Service: Podiatry;  Laterality: Right;  . CHOLECYSTECTOMY    . facial biopsy Right   . JOINT REPLACEMENT Right 12/2016   THR  . LOWER EXTREMITY ANGIOGRAPHY Right 09/27/2017   Procedure: LOWER EXTREMITY ANGIOGRAPHY;  Surgeon: Katha Cabal, MD;  Location: [facility];  Service: Cardiovascular;   Laterality: Right;  . TONSILLECTOMY    . TOTAL HIP ARTHROPLASTY Right 01/17/2017   Procedure: TOTAL HIP ARTHROPLASTY ANTERIOR APPROACH;  Surgeon: Lovell Sheehan, MD;  Location: [facility] ORS;  Service: Orthopedics;  Laterality: Right;    Prior to Admission medications   Medication Sig Start Date End Date Taking? Authorizing Provider  amLODipine (NORVASC) 10 MG tablet Take 10 mg by mouth daily.    [provider]  aspirin EC 81 MG tablet Take 1 tablet (81 mg total) by mouth daily. 09/27/17   Schnier, Dolores Lory, MD  clopidogrel (PLAVIX) 75 MG tablet Take 1 tablet (75 mg total) by mouth daily. 09/28/17   Schnier, Dolores Lory, MD  cyclobenzaprine (FLEXERIL) 10 MG tablet Take 10 mg by mouth daily.  06/16/17   [provider]  docusate sodium (COLACE) 100 MG capsule Take 1 capsule (100 mg total) by mouth 2 (two) times daily. 01/18/17   Lovell Sheehan, MD  furosemide (LASIX) 20 MG tablet Take 20 mg by mouth daily.    [provider]  HYDROcodone-acetaminophen (NORCO) 5-325 MG tablet Take 1-2 tablets by mouth every 6 (six) hours as needed for moderate pain or severe pain. 09/27/17   Schnier, Dolores Lory, MD  methocarbamol (ROBAXIN) 500 MG tablet Take 1 tablet (500 mg total) by mouth every 8 (eight) hours as needed for up to 5 days. 09/15/18 09/20/18  Lannie Fields, PA-C  predniSONE (STERAPRED UNI-PAK 21 TAB) 10 MG (21)  TBPK tablet Take 6 tablets the first day, take 5 tablets the second day, take 4 tablets the third day, take 3 tablets the fourth day, take 2 tablets the fifth day, take 1 tablet the sixth day. 09/15/18   Lannie Fields, PA-C  ranitidine (ZANTAC) 150 MG tablet Take 150 mg by mouth daily.    [provider]  senna (SENOKOT) 8.6 MG TABS tablet Take 1 tablet (8.6 mg total) by mouth 2 (two) times daily. 01/18/17   Lovell Sheehan, MD  traZODone (DESYREL) 100 MG tablet Take by mouth.    [provider]    Allergies Patient has no known allergies.  Family  History  Problem Relation Age of Onset  . Breast cancer Sister 71  . Breast cancer Other     Social History Social History   Tobacco Use  . Smoking status: Current Every Day Smoker    Packs/day: 0.50    Types: Cigarettes  . Smokeless tobacco: Former Network engineer Use Topics  . Alcohol use: Yes    Alcohol/week: 3.0 standard drinks    Types: 3 Cans of beer per week    Comment: occasional  . Drug use: No     Review of Systems  Constitutional: No fever/chills Eyes: No visual changes. No discharge ENT: No upper respiratory complaints. Cardiovascular: no chest pain. Respiratory: no cough. No SOB. Gastrointestinal: No abdominal pain.  No nausea, no vomiting.  No diarrhea.  No constipation. Musculoskeletal: Patient has neck pain and tinging in left hand.  Skin: Negative for rash, abrasions, lacerations, ecchymosis. Neurological: Negative for headaches, focal weakness or numbness.   ____________________________________________   PHYSICAL EXAM:  VITAL SIGNS: ED Triage Vitals  Enc Vitals Group     BP 09/15/18 1441 (!) 158/73     Pulse Rate 09/15/18 1441 88     Resp 09/15/18 1441 16     Temp 09/15/18 1441 99.3 F (37.4 C)     Temp Source 09/15/18 1441 Oral     SpO2 09/15/18 1441 95 %     Weight --      Height --      Head Circumference --      Peak Flow --      Pain Score 09/15/18 1442 9     Pain Loc --      Pain Edu? --      Excl. in [place]? --      Constitutional: Alert and oriented. Well appearing and in no acute distress. Eyes: Conjunctivae are normal. PERRL. EOMI. Head: Atraumatic. ENT:      Nose: No congestion/rhinnorhea.      Mouth/Throat: Mucous membranes are moist.  Neck: Patient has pain with lateral rotation of the neck. Cardiovascular: Normal rate, regular rhythm. Normal S1 and S2.  Good peripheral circulation. Respiratory: Normal respiratory effort without tachypnea or retractions. Lungs CTAB. Good air entry to the bases with no decreased or absent  breath sounds. Gastrointestinal: Bowel sounds 4 quadrants. Soft and nontender to palpation. No guarding or rigidity. No palpable masses. No distention. No CVA tenderness. Musculoskeletal: Full range of motion to all extremities. No gross deformities appreciated. Neurologic:  Normal speech and language. No gross focal neurologic deficits are appreciated.  Skin:  Skin is warm, dry and intact. No rash noted. Psychiatric: Mood and affect are normal. Speech and behavior are normal. Patient exhibits appropriate insight and judgement.   ____________________________________________   LABS (all labs ordered are listed, but only abnormal results are displayed)  Labs Reviewed  CBC WITH DIFFERENTIAL/PLATELET - Abnormal; Notable for the following components:      Result Value   RBC 3.69 (*)    MCV 102.4 (*)    All other components within normal limits  COMPREHENSIVE METABOLIC PANEL - Abnormal; Notable for the following components:   Glucose, Bld 125 (*)    All other components within normal limits  TROPONIN I - Abnormal; Notable for the following components:   Troponin I 0.06 (*)    All other components within normal limits   ____________________________________________  EKG  Ventricular rate 90 bpm.  Normal sinus rhythm without ST segment elevation.  Narrow QRS.  No peaked or flattened T waves. ____________________________________________  IFOYDXAJO I personally viewed and evaluated these images as part of my medical decision making, as well as reviewing the written report by the radiologist.  Dg Chest 1 View  Result Date: 09/15/2018 CLINICAL DATA:  Fall, cough EXAM: CHEST  1 VIEW COMPARISON:  02/03/2016 FINDINGS: Mild cardiomegaly. No acute consolidation or effusion. No pneumothorax. Mild bronchitic changes at the bases. IMPRESSION: No active disease.  Mild cardiomegaly. Electronically Signed   By: Donavan Foil M.D.   On: 09/15/2018 17:32   Ct Head Wo Contrast  Result Date:  09/15/2018 CLINICAL DATA:  Status post fall 2 days ago. Neck pain and tingling hands and feet. EXAM: CT HEAD WITHOUT CONTRAST CT CERVICAL SPINE WITHOUT CONTRAST TECHNIQUE: Multidetector CT imaging of the head and cervical spine was performed following the standard protocol without intravenous contrast. Multiplanar CT image reconstructions of the cervical spine were also generated. COMPARISON:  CT of the neck 05/09/2018 FINDINGS: CT HEAD FINDINGS Brain: No evidence of acute infarction, hemorrhage, hydrocephalus, extra-axial collection or mass lesion/mass effect. Vascular: Calcific atherosclerotic disease at the skull base. Skull: Normal. Negative for fracture or focal lesion. Sinuses/Orbits: No acute finding. Other: None. CT CERVICAL SPINE FINDINGS Alignment: Straightening of the cervical lordosis, likely positional. Skull base and vertebrae: No acute fracture. No primary bone lesion or focal pathologic process. Incomplete posterior fusion of C1, congenital. Soft tissues and spinal canal: No prevertebral fluid or swelling. No visible canal hematoma. Disc levels: Mild multilevel osteoarthritic changes of the cervical spine. Upper chest: Negative. Other: Bilateral parotid gland tumors, better visualized on CT angiogram of the neck dated 05/09/2018. IMPRESSION: 1. No acute intracranial abnormality. 2. No evidence of acute traumatic injury to cervical spine. 3. Mild multilevel osteoarthritic changes of the cervical spine. Electronically Signed   By: Fidela Salisbury M.D.   On: 09/15/2018 18:23   Ct Cervical Spine Wo Contrast  Result Date: 09/15/2018 CLINICAL DATA:  Status post fall 2 days ago. Neck pain and tingling hands and feet. EXAM: CT HEAD WITHOUT CONTRAST CT CERVICAL SPINE WITHOUT CONTRAST TECHNIQUE: Multidetector CT imaging of the head and cervical spine was performed following the standard protocol without intravenous contrast. Multiplanar CT image reconstructions of the cervical spine were also  generated. COMPARISON:  CT of the neck 05/09/2018 FINDINGS: CT HEAD FINDINGS Brain: No evidence of acute infarction, hemorrhage, hydrocephalus, extra-axial collection or mass lesion/mass effect. Vascular: Calcific atherosclerotic disease at the skull base. Skull: Normal. Negative for fracture or focal lesion. Sinuses/Orbits: No acute finding. Other: None. CT CERVICAL SPINE FINDINGS Alignment: Straightening of the cervical lordosis, likely positional. Skull base and vertebrae: No acute fracture. No primary bone lesion or focal pathologic process. Incomplete posterior fusion of C1, congenital. Soft tissues and spinal canal: No prevertebral fluid or swelling. No visible canal hematoma. Disc levels: Mild multilevel osteoarthritic changes of the  cervical spine. Upper chest: Negative. Other: Bilateral parotid gland tumors, better visualized on CT angiogram of the neck dated 05/09/2018. IMPRESSION: 1. No acute intracranial abnormality. 2. No evidence of acute traumatic injury to cervical spine. 3. Mild multilevel osteoarthritic changes of the cervical spine. Electronically Signed   By: Fidela Salisbury M.D.   On: 09/15/2018 18:23    ____________________________________________    PROCEDURES  Procedure(s) performed:    Procedures    Medications  cyclobenzaprine (FLEXERIL) tablet 5 mg (5 mg Oral Given 09/15/18 1850)  predniSONE (DELTASONE) tablet 60 mg (60 mg Oral Given 09/15/18 1850)     ____________________________________________   INITIAL IMPRESSION / ASSESSMENT AND PLAN / ED COURSE  Pertinent labs & imaging results that were available during my care of the patient were reviewed by me and considered in my medical decision making (see chart for details).  Review of the [place] CSRS was performed in accordance of the [place] prior to dispensing any controlled drugs.           Assessment and Plan: Fall:  63 year old female presents to the emergency department after a fall that occurred 2 days  ago with concern for neck pain that radiated along the left upper extremity.  On physical exam, patient was hypertensive.  She appeared uncomfortable.  Differential diagnosis included C-spine fracture, cervical radiculopathy, subarachnoid hemorrhage, pneumothorax...  CT head and CT cervical spine were reassuring and noncontributory for acute fracture or subarachnoid hemorrhage.  Chest x-ray revealed no evidence of pneumothorax.  Basic labs were reassuring in the emergency department.  Troponin was elevated but had trended down from patient's baseline at 0.07  Patient was given Flexeril and Decadron in the emergency department.  She was discharged with Robaxin and prednisone.  She was advised that if her pain persist, she is to follow-up with primary care.  She voiced understanding.  All patient questions were answered.   ____________________________________________  FINAL CLINICAL IMPRESSION(S) / ED DIAGNOSES  Final diagnoses:  Fall, initial encounter      NEW MEDICATIONS STARTED DURING THIS VISIT:  ED Discharge Orders         Ordered    predniSONE (STERAPRED UNI-PAK 21 TAB) 10 MG (21) TBPK tablet  Status:  Discontinued     09/15/18 1853    methocarbamol (ROBAXIN) 500 MG tablet  Every 8 hours PRN,   Status:  Discontinued     09/15/18 1853    predniSONE (STERAPRED UNI-PAK 21 TAB) 10 MG (21) TBPK tablet  Status:  Discontinued     09/15/18 1853    predniSONE (STERAPRED UNI-PAK 21 TAB) 10 MG (21) TBPK tablet     09/15/18 1854    methocarbamol (ROBAXIN) 500 MG tablet  Every 8 hours PRN     09/15/18 1855              This chart was dictated using voice recognition software/Dragon. Despite best efforts to proofread, errors can occur which can change the meaning. Any change was purely unintentional.    Karren Cobble 09/15/18 Ar[place]elle Park, MD 09/15/18 2356

## 2018-09-25 ENCOUNTER — Other Ambulatory Visit: Payer: Self-pay | Admitting: Family

## 2018-09-25 DIAGNOSIS — Z1231 Encounter for screening mammogram for malignant neoplasm of breast: Secondary | ICD-10-CM

## 2018-10-23 ENCOUNTER — Encounter (INDEPENDENT_AMBULATORY_CARE_PROVIDER_SITE_OTHER): Payer: Self-pay | Admitting: Vascular Surgery

## 2018-10-23 ENCOUNTER — Other Ambulatory Visit: Payer: Self-pay

## 2018-10-23 ENCOUNTER — Encounter (INDEPENDENT_AMBULATORY_CARE_PROVIDER_SITE_OTHER): Payer: Self-pay

## 2018-10-23 ENCOUNTER — Ambulatory Visit (INDEPENDENT_AMBULATORY_CARE_PROVIDER_SITE_OTHER): Payer: Medicare HMO | Admitting: Vascular Surgery

## 2018-10-23 VITALS — BP 167/74 | HR 83 | Resp 16 | Ht 66.0 in | Wt 229.0 lb

## 2018-10-23 DIAGNOSIS — I739 Peripheral vascular disease, unspecified: Secondary | ICD-10-CM | POA: Diagnosis not present

## 2018-10-23 DIAGNOSIS — I6523 Occlusion and stenosis of bilateral carotid arteries: Secondary | ICD-10-CM | POA: Diagnosis not present

## 2018-10-23 DIAGNOSIS — F17210 Nicotine dependence, cigarettes, uncomplicated: Secondary | ICD-10-CM

## 2018-10-23 DIAGNOSIS — Z79899 Other long term (current) drug therapy: Secondary | ICD-10-CM

## 2018-10-23 DIAGNOSIS — Z7902 Long term (current) use of antithrombotics/antiplatelets: Secondary | ICD-10-CM

## 2018-10-23 DIAGNOSIS — K219 Gastro-esophageal reflux disease without esophagitis: Secondary | ICD-10-CM | POA: Insufficient documentation

## 2018-10-23 DIAGNOSIS — I679 Cerebrovascular disease, unspecified: Secondary | ICD-10-CM | POA: Diagnosis not present

## 2018-10-23 DIAGNOSIS — I10 Essential (primary) hypertension: Secondary | ICD-10-CM

## 2018-10-23 DIAGNOSIS — Z7982 Long term (current) use of aspirin: Secondary | ICD-10-CM

## 2018-10-23 DIAGNOSIS — I6529 Occlusion and stenosis of unspecified carotid artery: Secondary | ICD-10-CM | POA: Insufficient documentation

## 2018-10-23 NOTE — Progress Notes (Signed)
MRN : [id number]  Jackie Macias is a 63 y.o. (1956-02-28) female who presents with chief complaint of  Chief Complaint  Patient presents with  . Follow-up    ref Mclean-Scocuzza for carotid stenosis  .  History of Present Illness:   The patient is seen for follow up evaluation of carotid stenosis. The carotid stenosis recently evaluated and noted to be high-grade by ultrasound.   The patient admits to some visual disturbances but denies a shade coming down over one eye causing blindness. There is no recent history of TIA symptoms or focal motor deficits. There is no prior documented CVA.  The patient is taking enteric-coated aspirin 81 mg daily.  There is no history of migraine headaches. There is no history of seizures.  The patient has a history of coronary artery disease, no recent episodes of angina or shortness of breath. The patient denies PAD or claudication symptoms. There is a history of hyperlipidemia which is being treated with a statin.    Carotid Duplex done today shows >70% bilateral ICA stenosis associated with distal carotid /siphon disease.     Current Meds  Medication Sig  . amLODipine (NORVASC) 10 MG tablet Take 10 mg by mouth daily.  Marland Kitchen aspirin EC 81 MG tablet Take 1 tablet (81 mg total) by mouth daily.  . clopidogrel (PLAVIX) 75 MG tablet Take 1 tablet (75 mg total) by mouth daily.  . cyclobenzaprine (FLEXERIL) 10 MG tablet Take 10 mg by mouth daily.   Marland Kitchen gabapentin (NEURONTIN) 300 MG capsule Take 300 mg by mouth 2 (two) times daily.  Marland Kitchen omeprazole (PRILOSEC) 20 MG capsule Take 20 mg by mouth daily.    Past Medical History:  Diagnosis Date  . Arthritis   . Bronchitis   . COPD (chronic obstructive pulmonary disease) ([place])   . GERD (gastroesophageal reflux disease)   . Gout   . Hypertension     Past Surgical History:  Procedure Laterality Date  . AMPUTATION Right 07/29/2017   Procedure: AMPUTATION DIGIT/ 81829;  Surgeon: Sharlotte Alamo, DPM;   Location: [facility];  Service: Podiatry;  Laterality: Right;  . CHOLECYSTECTOMY    . facial biopsy Right   . JOINT REPLACEMENT Right 12/2016   THR  . LOWER EXTREMITY ANGIOGRAPHY Right 09/27/2017   Procedure: LOWER EXTREMITY ANGIOGRAPHY;  Surgeon: Katha Cabal, MD;  Location: [facility];  Service: Cardiovascular;  Laterality: Right;  . TONSILLECTOMY    . TOTAL HIP ARTHROPLASTY Right 01/17/2017   Procedure: TOTAL HIP ARTHROPLASTY ANTERIOR APPROACH;  Surgeon: Lovell Sheehan, MD;  Location: [facility];  Service: Orthopedics;  Laterality: Right;    Social History Social History   Tobacco Use  . Smoking status: Current Every Day Smoker    Packs/day: 0.50    Types: Cigarettes  . Smokeless tobacco: Former Network engineer Use Topics  . Alcohol use: Yes    Alcohol/week: 3.0 standard drinks    Types: 3 Cans of beer per week    Comment: occasional  . Drug use: No    Family History Family History  Problem Relation Age of Onset  . Breast cancer Sister 82  . Breast cancer Other     No Known Allergies   REVIEW OF SYSTEMS (Negative unless checked)  Constitutional: [] Weight loss  [] Fever  [] Chills Cardiac: [] Chest pain   [] Chest pressure   [] Palpitations   [] Shortness of breath when laying flat   [] Shortness of breath with exertion. Vascular:  [] Pain in legs  with walking   [] Pain in legs at rest  [] History of DVT   [] Phlebitis   [] Swelling in legs   [] Varicose veins   [] Non-healing ulcers Pulmonary:   [] Uses home oxygen   [] Productive cough   [] Hemoptysis   [] Wheeze  [] COPD   [] Asthma Neurologic:  [] Dizziness   [] Seizures   [] History of stroke   [] History of TIA  [] Aphasia   [x] Vissual changes   [] Weakness or numbness in arm   [] Weakness or numbness in leg Musculoskeletal:   [] Joint swelling   [] Joint pain   [] Low back pain Hematologic:  [] Easy bruising  [] Easy bleeding   [] Hypercoagulable state   [] Anemic Gastrointestinal:  [] Diarrhea   [] Vomiting  [x] Gastroesophageal  reflux/heartburn   [] Difficulty swallowing. Genitourinary:  [] Chronic kidney disease   [] Difficult urination  [] Frequent urination   [] Blood in urine Skin:  [] Rashes   [] Ulcers  Psychological:  [] History of anxiety   []  History of major depression.  Physical Examination  Vitals:   10/23/18 1043  BP: (!) 167/74  Pulse: 83  Resp: 16  Weight: 229 lb (103.9 kg)  Height: 5\' 6"  (1.676 m)   Body mass index is 36.96 kg/m. Gen: WD/WN, NAD Head: [place]/AT, No temporalis wasting.  Ear/Nose/Throat: Hearing grossly intact, nares w/o erythema or drainage Eyes: PER, EOMI, sclera nonicteric.  Neck: Supple, no large masses.   Pulmonary:  Good air movement, no audible wheezing bilaterally, no use of accessory muscles.  Cardiac: RRR, no JVD Vascular: bilateral carotid bruit Vessel Right Left  Radial Palpable Palpable  Brachial Palpable Palpable  Carotid Palpable Palpable  Gastrointestinal: Non-distended. No guarding/no peritoneal signs.  Musculoskeletal: M/S 5/5 throughout.  No deformity or atrophy.  Neurologic: CN 2-12 intact. Symmetrical.  Speech is fluent. Motor exam as listed above. Psychiatric: Judgment intact, Mood & affect appropriate for pt's clinical situation. Dermatologic: No rashes or ulcers noted.  No changes consistent with cellulitis. Lymph : No lichenification or skin changes of chronic lymphedema.  CBC Lab Results  Component Value Date   WBC 4.6 09/15/2018   HGB 12.5 09/15/2018   HCT 37.8 09/15/2018   MCV 102.4 (H) 09/15/2018   PLT 259 09/15/2018    BMET    Component Value Date/Time   NA 139 09/15/2018 1742   K 4.0 09/15/2018 1742   CL 104 09/15/2018 1742   CO2 26 09/15/2018 1742   GLUCOSE 125 (H) 09/15/2018 1742   BUN 20 09/15/2018 1742   CREATININE 0.97 09/15/2018 1742   CALCIUM 9.6 09/15/2018 1742   GFRNONAA >60 09/15/2018 1742   GFRAA >60 09/15/2018 1742   CrCl cannot be calculated (Patient's most recent lab result is older than the maximum 21 days allowed.).   COAG Lab Results  Component Value Date   INR 1.04 01/05/2017    Radiology No results found.    Assessment/Plan 1. Bilateral carotid artery stenosis The patient remains asymptomatic with respect to the carotid stenosis.  However, the patient has now progressed and has a lesion the is >70%.  Patient should undergo CT angiography of the carotid arteries to define the degree of stenosis of the internal carotid arteries bilaterally and the anatomic suitability for surgery vs. intervention.  If the patient does indeed need surgery cardiac clearance will be required, once cleared the patient will be scheduled for surgery.  The risks, benefits and alternative therapies were reviewed in detail with the patient.  All questions were answered.  The patient agrees to proceed with imaging.  Continue antiplatelet therapy as prescribed.  Continue management of CAD, HTN and Hyperlipidemia. Healthy heart diet, encouraged exercise at least 4 times per week.   - CT ANGIO NECK W OR WO CONTRAST; Future  2. Intracranial vascular stenosis The patient remains asymptomatic with respect to the carotid stenosis.  However, the patient has now progressed and has a lesion the is >70%.  Patient should undergo CT angiography of the carotid arteries to define the degree of stenosis of the internal carotid arteries bilaterally and the anatomic suitability for surgery vs. intervention.  If the patient does indeed need surgery cardiac clearance will be required, once cleared the patient will be scheduled for surgery.  The risks, benefits and alternative therapies were reviewed in detail with the patient.  All questions were answered.  The patient agrees to proceed with imaging.  Continue antiplatelet therapy as prescribed. Continue management of CAD, HTN and Hyperlipidemia. Healthy heart diet, encouraged exercise at least 4 times per week.   - CT ANGIO HEAD W OR WO CONTRAST; Future  3. PAD (peripheral artery  disease) (H[facility])  Recommend:  The patient has evidence of atherosclerosis of the lower extremities with claudication.  The patient does not voice lifestyle limiting changes at this point in time.  Noninvasive studies do not suggest clinically significant change.  No invasive studies, angiography or surgery at this time The patient should continue walking and begin a more formal exercise program.  The patient should continue antiplatelet therapy and aggressive treatment of the lipid abnormalities  No changes in the patient's medications at this time  The patient should continue wearing graduated compression socks 10-15 mmHg strength to control the mild edema.    4. Essential hypertension Continue antihypertensive medications as already ordered, these medications have been reviewed and there are no changes at this time.   5. Gastroesophageal reflux disease without esophagitis Continue PPI as already ordered, this medication has been reviewed and there are no changes at this time.  Avoidence of caffeine and alcohol  Moderate elevation of the head of the bed    Hortencia Pilar, MD  10/23/2018 10:59 AM

## 2018-10-24 ENCOUNTER — Encounter (INDEPENDENT_AMBULATORY_CARE_PROVIDER_SITE_OTHER): Payer: Self-pay | Admitting: Vascular Surgery

## 2018-11-14 ENCOUNTER — Other Ambulatory Visit: Payer: Self-pay

## 2018-11-14 ENCOUNTER — Ambulatory Visit
Admission: RE | Admit: 2018-11-14 | Discharge: 2018-11-14 | Disposition: A | Discharge status: Home / Self Care | Payer: Medicare HMO | Source: Ambulatory Visit | Attending: Vascular Surgery | Admitting: Vascular Surgery

## 2018-11-14 DIAGNOSIS — I6523 Occlusion and stenosis of bilateral carotid arteries: Secondary | ICD-10-CM | POA: Insufficient documentation

## 2018-11-14 DIAGNOSIS — I679 Cerebrovascular disease, unspecified: Secondary | ICD-10-CM

## 2018-11-14 LAB — POCT I-STAT CREATININE: Creatinine, Ser: 0.9 mg/dL (ref 0.44–1.00)

## 2018-11-14 MED ORDER — IOHEXOL 350 MG/ML SOLN
75.0000 mL | Freq: Once | INTRAVENOUS | Status: AC | PRN
  Administered 2018-11-14: 12:00:00 75 mL via INTRAVENOUS

## 2019-02-08 ENCOUNTER — Other Ambulatory Visit: Payer: Self-pay | Admitting: Family

## 2019-02-08 DIAGNOSIS — Z1231 Encounter for screening mammogram for malignant neoplasm of breast: Secondary | ICD-10-CM

## 2019-04-25 ENCOUNTER — Ambulatory Visit
Admission: RE | Admit: 2019-04-25 | Discharge: 2019-04-25 | Disposition: A | Discharge status: Home / Self Care | Payer: Medicare HMO | Source: Ambulatory Visit | Attending: Family | Admitting: Family

## 2019-04-25 DIAGNOSIS — Z1231 Encounter for screening mammogram for malignant neoplasm of breast: Secondary | ICD-10-CM | POA: Insufficient documentation

## 2019-10-05 DIAGNOSIS — K118 Other diseases of salivary glands: Secondary | ICD-10-CM | POA: Insufficient documentation

## 2019-10-23 ENCOUNTER — Telehealth (INDEPENDENT_AMBULATORY_CARE_PROVIDER_SITE_OTHER): Payer: Self-pay | Admitting: Vascular Surgery

## 2019-10-23 NOTE — Telephone Encounter (Signed)
When the fax comes through it will be placed on Dr. Nino Parsley desk. Thank you

## 2019-10-23 NOTE — Telephone Encounter (Signed)
Jackie Macias called from [facility] stating that she faxed over a form requesting that patient stop her Plavix prescription before her procedure. She is re-faxing the order now.

## 2019-11-07 ENCOUNTER — Other Ambulatory Visit: Payer: Self-pay | Admitting: Family

## 2019-11-07 DIAGNOSIS — M19011 Primary osteoarthritis, right shoulder: Secondary | ICD-10-CM

## 2019-11-07 DIAGNOSIS — M25511 Pain in right shoulder: Secondary | ICD-10-CM

## 2019-11-20 ENCOUNTER — Telehealth (INDEPENDENT_AMBULATORY_CARE_PROVIDER_SITE_OTHER): Payer: Self-pay | Admitting: Vascular Surgery

## 2019-11-20 NOTE — Telephone Encounter (Signed)
Bring her in with ABIs.  Patient should have been seen in 10/2018 for f/u of carotid CT scan with Dr. Delana Meyer. She can see GS or me

## 2019-11-20 NOTE — Telephone Encounter (Signed)
Called stating both legs are aching. Patient states that she does elevate her legs occasionally. There is no swelling but she would like to come in to be seen. Patient was last seen 10-23-2018 as a follow up with GS.

## 2019-11-27 ENCOUNTER — Ambulatory Visit: Payer: Medicare HMO

## 2019-12-04 ENCOUNTER — Other Ambulatory Visit (INDEPENDENT_AMBULATORY_CARE_PROVIDER_SITE_OTHER): Payer: Self-pay | Admitting: Nurse Practitioner

## 2019-12-04 DIAGNOSIS — I739 Peripheral vascular disease, unspecified: Secondary | ICD-10-CM

## 2019-12-05 ENCOUNTER — Ambulatory Visit (INDEPENDENT_AMBULATORY_CARE_PROVIDER_SITE_OTHER): Payer: Medicare HMO | Admitting: Nurse Practitioner

## 2019-12-05 ENCOUNTER — Encounter (INDEPENDENT_AMBULATORY_CARE_PROVIDER_SITE_OTHER): Payer: Medicare HMO

## 2019-12-17 ENCOUNTER — Ambulatory Visit
Admission: RE | Admit: 2019-12-17 | Discharge: 2019-12-17 | Disposition: A | Discharge status: Home / Self Care | Payer: Medicare HMO | Source: Ambulatory Visit | Attending: Family | Admitting: Family

## 2019-12-17 ENCOUNTER — Other Ambulatory Visit: Payer: Self-pay

## 2019-12-17 DIAGNOSIS — M25511 Pain in right shoulder: Secondary | ICD-10-CM | POA: Diagnosis not present

## 2019-12-17 DIAGNOSIS — M19011 Primary osteoarthritis, right shoulder: Secondary | ICD-10-CM | POA: Insufficient documentation

## 2020-01-17 DIAGNOSIS — J449 Chronic obstructive pulmonary disease, unspecified: Secondary | ICD-10-CM | POA: Diagnosis present

## 2020-01-17 DIAGNOSIS — N318 Other neuromuscular dysfunction of bladder: Secondary | ICD-10-CM | POA: Insufficient documentation

## 2020-05-27 ENCOUNTER — Other Ambulatory Visit
Admission: RE | Admit: 2020-05-27 | Discharge: 2020-05-27 | Disposition: A | Discharge status: Home / Self Care | Payer: Medicare HMO | Source: Ambulatory Visit | Attending: Podiatry | Admitting: Podiatry

## 2020-05-27 DIAGNOSIS — L03115 Cellulitis of right lower limb: Secondary | ICD-10-CM | POA: Insufficient documentation

## 2020-05-31 LAB — AEROBIC/ANAEROBIC CULTURE W GRAM STAIN (SURGICAL/DEEP WOUND)

## 2020-06-06 ENCOUNTER — Ambulatory Visit (INDEPENDENT_AMBULATORY_CARE_PROVIDER_SITE_OTHER): Payer: Medicare HMO | Admitting: Nurse Practitioner

## 2020-06-06 ENCOUNTER — Encounter (INDEPENDENT_AMBULATORY_CARE_PROVIDER_SITE_OTHER): Payer: Self-pay | Admitting: Nurse Practitioner

## 2020-06-06 ENCOUNTER — Ambulatory Visit (INDEPENDENT_AMBULATORY_CARE_PROVIDER_SITE_OTHER): Payer: Medicare HMO

## 2020-06-06 ENCOUNTER — Telehealth (INDEPENDENT_AMBULATORY_CARE_PROVIDER_SITE_OTHER): Payer: Self-pay

## 2020-06-06 ENCOUNTER — Other Ambulatory Visit: Payer: Self-pay

## 2020-06-06 VITALS — BP 169/74 | HR 89 | Ht 66.0 in | Wt 221.0 lb

## 2020-06-06 DIAGNOSIS — I739 Peripheral vascular disease, unspecified: Secondary | ICD-10-CM | POA: Diagnosis not present

## 2020-06-06 DIAGNOSIS — M76899 Other specified enthesopathies of unspecified lower limb, excluding foot: Secondary | ICD-10-CM | POA: Insufficient documentation

## 2020-06-06 DIAGNOSIS — K219 Gastro-esophageal reflux disease without esophagitis: Secondary | ICD-10-CM | POA: Diagnosis not present

## 2020-06-06 DIAGNOSIS — I6523 Occlusion and stenosis of bilateral carotid arteries: Secondary | ICD-10-CM

## 2020-06-06 DIAGNOSIS — I10 Essential (primary) hypertension: Secondary | ICD-10-CM

## 2020-06-06 DIAGNOSIS — M1710 Unilateral primary osteoarthritis, unspecified knee: Secondary | ICD-10-CM | POA: Insufficient documentation

## 2020-06-06 NOTE — Telephone Encounter (Signed)
Spoke with the patient and she is scheduled with Dr. Delana Meyer for a RLE angio on 06/17/20 with a 11:00 am arrival time to the [facility]. Covid testing on 06/13/20 between 8-2 pm at the [facility]. Pre-procedure instructions were discussed and will be mailed.

## 2020-06-06 NOTE — H&P (View-Only) (Signed)
Subjective:    Patient ID: Jackie Macias, female    DOB: 01/16/1956, 65 y.o.   MRN: [id number] Chief Complaint  Patient presents with  . Follow-up    U/S    The patient returns to the office for followup and review of the noninvasive studies. There has been a significant deterioration in the lower extremity symptoms.  The patient notes interval shortening of their claudication distance and development of mild rest pain symptoms.  The patient has a partial toe amputation of the right great toe.  It recently developed an ulceration and has been very slow to heal.  The patient also has a history of carotid artery stenosis  There have been no significant changes to the patient's overall health care.  The patient denies amaurosis fugax or recent TIA symptoms. There are no recent neurological changes noted. The patient denies history of DVT, PE or superficial thrombophlebitis. The patient denies recent episodes of angina or shortness of breath.   ABI's Rt= 0.53 and Lt= 0.64 (no previous ABIs) Duplex US of the lower extremity arterial system shows monophasic tibial artery waveforms bilaterally.  The right second toe has severely diminished waveforms with the left great toe having good toe waveforms.   Review of Systems  Cardiovascular: Positive for leg swelling.  Musculoskeletal: Positive for gait problem.  Skin: Positive for wound.  Neurological: Positive for numbness.       Objective:   Physical Exam Vitals reviewed.  HENT:     Head: Normocephalic.  Cardiovascular:     Rate and Rhythm: Normal rate.     Pulses:          Dorsalis pedis pulses are detected w/ Doppler on the right side and 1+ on the left side.       Posterior tibial pulses are detected w/ Doppler on the right side.  Pulmonary:     Effort: Pulmonary effort is normal.  Musculoskeletal:       Feet:  Feet:     Left foot:     Skin integrity: Ulcer present.  Neurological:     Mental Status: She is alert and  oriented to person, place, and time.  Psychiatric:        Mood and Affect: Mood normal.        Behavior: Behavior normal.        Thought Content: Thought content normal.        Judgment: Judgment normal.     BP (!) 169/74   Pulse 89   Ht 5\' 6"  (1.676 m)   Wt 221 lb (100.2 kg)   BMI 35.67 kg/m   Past Medical History:  Diagnosis Date  . Arthritis   . Bronchitis   . COPD (chronic obstructive pulmonary disease) ([place])   . GERD (gastroesophageal reflux disease)   . Gout   . Hypertension     Social History   Socioeconomic History  . Marital status: Single    Spouse name: Not on file  . Number of children: Not on file  . Years of education: Not on file  . Highest education level: Not on file  Occupational History  . Not on file  Tobacco Use  . Smoking status: Current Every Day Smoker    Packs/day: 0.50    Types: Cigarettes  . Smokeless tobacco: Former Network engineer  . Vaping Use: Never used  Substance and Sexual Activity  . Alcohol use: Yes    Alcohol/week: 3.0 standard drinks    Types:  3 Cans of beer per week    Comment: occasional  . Drug use: No  . Sexual activity: Not on file  Other Topics Concern  . Not on file  Social History Narrative  . Not on file   Social Determinants of Health   Financial Resource Strain: Not on file  Food Insecurity: Not on file  Transportation Needs: Not on file  Physical Activity: Not on file  Stress: Not on file  Social Connections: Not on file  Intimate Partner Violence: Not on file    Past Surgical History:  Procedure Laterality Date  . AMPUTATION Right 07/29/2017   Procedure: AMPUTATION DIGIT/ 09983;  Surgeon: Sharlotte Alamo, DPM;  Location: [facility];  Service: Podiatry;  Laterality: Right;  . CHOLECYSTECTOMY    . facial biopsy Right   . JOINT REPLACEMENT Right 12/2016   THR  . LOWER EXTREMITY ANGIOGRAPHY Right 09/27/2017   Procedure: LOWER EXTREMITY ANGIOGRAPHY;  Surgeon: Katha Cabal, MD;  Location: [facility];  Service: Cardiovascular;  Laterality: Right;  . TONSILLECTOMY    . TOTAL HIP ARTHROPLASTY Right 01/17/2017   Procedure: TOTAL HIP ARTHROPLASTY ANTERIOR APPROACH;  Surgeon: Lovell Sheehan, MD;  Location: [facility];  Service: Orthopedics;  Laterality: Right;    Family History  Problem Relation Age of Onset  . Breast cancer Sister 50  . Breast cancer Other     No Known Allergies  CBC Latest Ref Rng & Units 09/15/2018 07/28/2017 01/18/2017  WBC 4.0 - 10.5 K/uL 4.6 5.6 12.3(H)  Hemoglobin 12.0 - 15.0 g/dL 12.5 12.7 11.5(L)  Hematocrit 36.0 - 46.0 % 37.8 36.6 34.7(L)  Platelets 150 - 400 K/uL 259 334 291      CMP     Component Value Date/Time   NA 139 09/15/2018 1742   K 4.0 09/15/2018 1742   CL 104 09/15/2018 1742   CO2 26 09/15/2018 1742   GLUCOSE 125 (H) 09/15/2018 1742   BUN 20 09/15/2018 1742   CREATININE 0.90 11/14/2018 1138   CALCIUM 9.6 09/15/2018 1742   PROT 7.8 09/15/2018 1742   ALBUMIN 3.9 09/15/2018 1742   AST 24 09/15/2018 1742   ALT 14 09/15/2018 1742   ALKPHOS 89 09/15/2018 1742   BILITOT 0.6 09/15/2018 1742   GFRNONAA >60 09/15/2018 1742   GFRAA >60 09/15/2018 1742     No results found.     Assessment & Plan:   1. PAD (peripheral artery disease) (HCC)  Recommend:  The patient has evidence of severe atherosclerotic changes of both lower extremities associated with ulceration and tissue loss of the foot.  This represents a limb threatening ischemia and places the patient at the risk for limb loss.  Patient should undergo angiography of the left lower extremity with the hope for intervention for limb salvage.  The risks and benefits as well as the alternative therapies was discussed in detail with the patient.  All questions were answered.  Patient agrees to proceed with angiography.  The patient will follow up with me in the office after the procedure.    2. Essential hypertension Continue antihypertensive medications as already  ordered, these medications have been reviewed and there are no changes at this time.   3. Gastroesophageal reflux disease without esophagitis Continue PPI as already ordered, this medication has been reviewed and there are no changes at this time.  Avoidence of caffeine and alcohol  Moderate elevation of the head of the bed     4. Bilateral carotid artery  stenosis The patient was previously seen for bilateral carotid stenosis.  Previously the patient had a known high-grade stenosis.  We will try to obtain a carotid artery duplex at the patient's follow-up following her procedure.   Current Outpatient Medications on File Prior to Visit  Medication Sig Dispense Refill  . Acetaminophen 500 MG capsule Take by mouth.    Marland Kitchen albuterol (VENTOLIN HFA) 108 (90 Base) MCG/ACT inhaler albuterol sulfate HFA 90 mcg/actuation aerosol inhaler    . amLODipine (NORVASC) 10 MG tablet Take 10 mg by mouth daily.    Marland Kitchen amoxicillin-clavulanate (AUGMENTIN) 875-125 MG tablet Take by mouth.    Marland Kitchen aspirin EC 81 MG tablet Take 1 tablet (81 mg total) by mouth daily. 150 tablet 2  . clopidogrel (PLAVIX) 75 MG tablet Take 1 tablet (75 mg total) by mouth daily. 30 tablet 11  . cyclobenzaprine (FLEXERIL) 10 MG tablet Take 10 mg by mouth daily.  0  . docusate sodium (COLACE) 100 MG capsule Take 1 capsule (100 mg total) by mouth 2 (two) times daily. 10 capsule 0  . furosemide (LASIX) 20 MG tablet Take 20 mg by mouth daily.    Marland Kitchen gabapentin (NEURONTIN) 300 MG capsule Take 300 mg by mouth 2 (two) times daily.    Marland Kitchen HYDROcodone-acetaminophen (NORCO) 5-325 MG tablet Take 1-2 tablets by mouth every 6 (six) hours as needed for moderate pain or severe pain. 30 tablet 0  . magnesium hydroxide (MILK OF MAGNESIA) 400 MG/5ML suspension Take by mouth.    Marland Kitchen omeprazole (PRILOSEC) 20 MG capsule Take 20 mg by mouth daily.    . predniSONE (STERAPRED UNI-PAK 21 TAB) 10 MG (21) TBPK tablet Take 6 tablets the first day, take 5 tablets the second  day, take 4 tablets the third day, take 3 tablets the fourth day, take 2 tablets the fifth day, take 1 tablet the sixth day. 21 tablet 0  . ranitidine (ZANTAC) 150 MG tablet Take 150 mg by mouth daily.    Marland Kitchen senna (SENOKOT) 8.6 MG TABS tablet Take 1 tablet (8.6 mg total) by mouth 2 (two) times daily. 120 each 0  . traZODone (DESYREL) 100 MG tablet Take by mouth.     No current facility-administered medications on file prior to visit.    There are no Patient Instructions on file for this visit. No follow-ups on file.   Kris Hartmann, NP

## 2020-06-06 NOTE — Progress Notes (Signed)
Subjective:    Patient ID: Jackie Macias, female    DOB: [birth year], 65 y.o.   MRN: [id number] Chief Complaint  Patient presents with  . Follow-up    U/S    The patient returns to the office for followup and review of the noninvasive studies. There has been a significant deterioration in the lower extremity symptoms.  The patient notes interval shortening of their claudication distance and development of mild rest pain symptoms.  The patient has a partial toe amputation of the right great toe.  It recently developed an ulceration and has been very slow to heal.  The patient also has a history of carotid artery stenosis  There have been no significant changes to the patient's overall health care.  The patient denies amaurosis fugax or recent TIA symptoms. There are no recent neurological changes noted. The patient denies history of DVT, PE or superficial thrombophlebitis. The patient denies recent episodes of angina or shortness of breath.   ABI's Rt= 0.53 and Lt= 0.64 (no previous ABIs) Duplex US of the lower extremity arterial system shows monophasic tibial artery waveforms bilaterally.  The right second toe has severely diminished waveforms with the left great toe having good toe waveforms.   Review of Systems  Cardiovascular: Positive for leg swelling.  Musculoskeletal: Positive for gait problem.  Skin: Positive for wound.  Neurological: Positive for numbness.       Objective:   Physical Exam Vitals reviewed.  HENT:     Head: Normocephalic.  Cardiovascular:     Rate and Rhythm: Normal rate.     Pulses:          Dorsalis pedis pulses are detected w/ Doppler on the right side and 1+ on the left side.       Posterior tibial pulses are detected w/ Doppler on the right side.  Pulmonary:     Effort: Pulmonary effort is normal.  Musculoskeletal:       Feet:  Feet:     Left foot:     Skin integrity: Ulcer present.  Neurological:     Mental Status: She is alert and  oriented to person, place, and time.  Psychiatric:        Mood and Affect: Mood normal.        Behavior: Behavior normal.        Thought Content: Thought content normal.        Judgment: Judgment normal.     BP (!) 169/74   Pulse 89   Ht 5\' 6"  (1.676 m)   Wt 221 lb (100.2 kg)   BMI 35.67 kg/m   Past Medical History:  Diagnosis Date  . Arthritis   . Bronchitis   . COPD (chronic obstructive pulmonary disease) ([place])   . GERD (gastroesophageal reflux disease)   . Gout   . Hypertension     Social History   Socioeconomic History  . Marital status: Single    Spouse name: Not on file  . Number of children: Not on file  . Years of education: Not on file  . Highest education level: Not on file  Occupational History  . Not on file  Tobacco Use  . Smoking status: Current Every Day Smoker    Packs/day: 0.50    Types: Cigarettes  . Smokeless tobacco: Former Network engineer  . Vaping Use: Never used  Substance and Sexual Activity  . Alcohol use: Yes    Alcohol/week: 3.0 standard drinks    Types:  3 Cans of beer per week    Comment: occasional  . Drug use: No  . Sexual activity: Not on file  Other Topics Concern  . Not on file  Social History Narrative  . Not on file   Social Determinants of Health   Financial Resource Strain: Not on file  Food Insecurity: Not on file  Transportation Needs: Not on file  Physical Activity: Not on file  Stress: Not on file  Social Connections: Not on file  Intimate Partner Violence: Not on file    Past Surgical History:  Procedure Laterality Date  . AMPUTATION Right 07/29/2017   Procedure: AMPUTATION DIGIT/ 12878;  Surgeon: Sharlotte Alamo, DPM;  Location: [facility];  Service: Podiatry;  Laterality: Right;  . CHOLECYSTECTOMY    . facial biopsy Right   . JOINT REPLACEMENT Right 12/2016   THR  . LOWER EXTREMITY ANGIOGRAPHY Right 09/27/2017   Procedure: LOWER EXTREMITY ANGIOGRAPHY;  Surgeon: Katha Cabal, MD;  Location: [facility];  Service: Cardiovascular;  Laterality: Right;  . TONSILLECTOMY    . TOTAL HIP ARTHROPLASTY Right 01/17/2017   Procedure: TOTAL HIP ARTHROPLASTY ANTERIOR APPROACH;  Surgeon: Lovell Sheehan, MD;  Location: [facility];  Service: Orthopedics;  Laterality: Right;    Family History  Problem Relation Age of Onset  . Breast cancer Sister 27  . Breast cancer Other     No Known Allergies  CBC Latest Ref Rng & Units 09/15/2018 07/28/2017 01/18/2017  WBC 4.0 - 10.5 K/uL 4.6 5.6 12.3(H)  Hemoglobin 12.0 - 15.0 g/dL 12.5 12.7 11.5(L)  Hematocrit 36.0 - 46.0 % 37.8 36.6 34.7(L)  Platelets 150 - 400 K/uL 259 334 291      CMP     Component Value Date/Time   NA 139 09/15/2018 1742   K 4.0 09/15/2018 1742   CL 104 09/15/2018 1742   CO2 26 09/15/2018 1742   GLUCOSE 125 (H) 09/15/2018 1742   BUN 20 09/15/2018 1742   CREATININE 0.90 11/14/2018 1138   CALCIUM 9.6 09/15/2018 1742   PROT 7.8 09/15/2018 1742   ALBUMIN 3.9 09/15/2018 1742   AST 24 09/15/2018 1742   ALT 14 09/15/2018 1742   ALKPHOS 89 09/15/2018 1742   BILITOT 0.6 09/15/2018 1742   GFRNONAA >60 09/15/2018 1742   GFRAA >60 09/15/2018 1742     No results found.     Assessment & Plan:   1. PAD (peripheral artery disease) (HCC)  Recommend:  The patient has evidence of severe atherosclerotic changes of both lower extremities associated with ulceration and tissue loss of the foot.  This represents a limb threatening ischemia and places the patient at the risk for limb loss.  Patient should undergo angiography of the left lower extremity with the hope for intervention for limb salvage.  The risks and benefits as well as the alternative therapies was discussed in detail with the patient.  All questions were answered.  Patient agrees to proceed with angiography.  The patient will follow up with me in the office after the procedure.    2. Essential hypertension Continue antihypertensive medications as already  ordered, these medications have been reviewed and there are no changes at this time.   3. Gastroesophageal reflux disease without esophagitis Continue PPI as already ordered, this medication has been reviewed and there are no changes at this time.  Avoidence of caffeine and alcohol  Moderate elevation of the head of the bed     4. Bilateral carotid artery  stenosis The patient was previously seen for bilateral carotid stenosis.  Previously the patient had a known high-grade stenosis.  We will try to obtain a carotid artery duplex at the patient's follow-up following her procedure.   Current Outpatient Medications on File Prior to Visit  Medication Sig Dispense Refill  . Acetaminophen 500 MG capsule Take by mouth.    Marland Kitchen albuterol (VENTOLIN HFA) 108 (90 Base) MCG/ACT inhaler albuterol sulfate HFA 90 mcg/actuation aerosol inhaler    . amLODipine (NORVASC) 10 MG tablet Take 10 mg by mouth daily.    Marland Kitchen amoxicillin-clavulanate (AUGMENTIN) 875-125 MG tablet Take by mouth.    Marland Kitchen aspirin EC 81 MG tablet Take 1 tablet (81 mg total) by mouth daily. 150 tablet 2  . clopidogrel (PLAVIX) 75 MG tablet Take 1 tablet (75 mg total) by mouth daily. 30 tablet 11  . cyclobenzaprine (FLEXERIL) 10 MG tablet Take 10 mg by mouth daily.  0  . docusate sodium (COLACE) 100 MG capsule Take 1 capsule (100 mg total) by mouth 2 (two) times daily. 10 capsule 0  . furosemide (LASIX) 20 MG tablet Take 20 mg by mouth daily.    Marland Kitchen gabapentin (NEURONTIN) 300 MG capsule Take 300 mg by mouth 2 (two) times daily.    Marland Kitchen HYDROcodone-acetaminophen (NORCO) 5-325 MG tablet Take 1-2 tablets by mouth every 6 (six) hours as needed for moderate pain or severe pain. 30 tablet 0  . magnesium hydroxide (MILK OF MAGNESIA) 400 MG/5ML suspension Take by mouth.    Marland Kitchen omeprazole (PRILOSEC) 20 MG capsule Take 20 mg by mouth daily.    . predniSONE (STERAPRED UNI-PAK 21 TAB) 10 MG (21) TBPK tablet Take 6 tablets the first day, take 5 tablets the second  day, take 4 tablets the third day, take 3 tablets the fourth day, take 2 tablets the fifth day, take 1 tablet the sixth day. 21 tablet 0  . ranitidine (ZANTAC) 150 MG tablet Take 150 mg by mouth daily.    Marland Kitchen senna (SENOKOT) 8.6 MG TABS tablet Take 1 tablet (8.6 mg total) by mouth 2 (two) times daily. 120 each 0  . traZODone (DESYREL) 100 MG tablet Take by mouth.     No current facility-administered medications on file prior to visit.    There are no Patient Instructions on file for this visit. No follow-ups on file.   Kris Hartmann, NP

## 2020-06-13 ENCOUNTER — Telehealth (INDEPENDENT_AMBULATORY_CARE_PROVIDER_SITE_OTHER): Payer: Self-pay

## 2020-06-13 ENCOUNTER — Other Ambulatory Visit: Payer: Self-pay

## 2020-06-13 ENCOUNTER — Other Ambulatory Visit
Admission: RE | Admit: 2020-06-13 | Discharge: 2020-06-13 | Disposition: A | Discharge status: Home / Self Care | Payer: Medicare HMO | Source: Ambulatory Visit | Attending: Vascular Surgery | Admitting: Vascular Surgery

## 2020-06-13 DIAGNOSIS — Z01812 Encounter for preprocedural laboratory examination: Secondary | ICD-10-CM | POA: Diagnosis present

## 2020-06-13 DIAGNOSIS — Z20822 Contact with and (suspected) exposure to covid-19: Secondary | ICD-10-CM | POA: Insufficient documentation

## 2020-06-13 LAB — SARS CORONAVIRUS 2 (TAT 6-24 HRS): SARS Coronavirus 2: NEGATIVE

## 2020-06-13 NOTE — Telephone Encounter (Signed)
I called the pt and made her aware of the Np's instructions. 

## 2020-06-13 NOTE — Telephone Encounter (Signed)
Pt called and left a VM on the nurses line wanting to  Know will she get put to sleep during her upcoming angio and will she receive pain medication afterward I spoke with the NP and she made me aware that the pt will get Fentanyl and versed during the procedure and many people do go to  Sleep and as far as pain medication for home she will have to discuss with the provider after the procedure.

## 2020-06-16 ENCOUNTER — Other Ambulatory Visit (INDEPENDENT_AMBULATORY_CARE_PROVIDER_SITE_OTHER): Payer: Self-pay | Admitting: Nurse Practitioner

## 2020-06-17 ENCOUNTER — Encounter: Payer: Self-pay | Admitting: Vascular Surgery

## 2020-06-17 ENCOUNTER — Encounter
Admission: RE | Disposition: A | Discharge status: Home / Self Care | Payer: Self-pay | Source: Home / Self Care | Attending: Vascular Surgery

## 2020-06-17 ENCOUNTER — Ambulatory Visit
Admission: RE | Admit: 2020-06-17 | Discharge: 2020-06-17 | Disposition: A | Discharge status: Home / Self Care | Payer: Medicare HMO | Attending: Vascular Surgery | Admitting: Vascular Surgery

## 2020-06-17 ENCOUNTER — Other Ambulatory Visit: Payer: Self-pay

## 2020-06-17 DIAGNOSIS — I70235 Atherosclerosis of native arteries of right leg with ulceration of other part of foot: Secondary | ICD-10-CM | POA: Diagnosis present

## 2020-06-17 DIAGNOSIS — L97519 Non-pressure chronic ulcer of other part of right foot with unspecified severity: Secondary | ICD-10-CM | POA: Diagnosis not present

## 2020-06-17 DIAGNOSIS — L97909 Non-pressure chronic ulcer of unspecified part of unspecified lower leg with unspecified severity: Secondary | ICD-10-CM

## 2020-06-17 DIAGNOSIS — Z7902 Long term (current) use of antithrombotics/antiplatelets: Secondary | ICD-10-CM | POA: Diagnosis not present

## 2020-06-17 DIAGNOSIS — I10 Essential (primary) hypertension: Secondary | ICD-10-CM | POA: Diagnosis not present

## 2020-06-17 DIAGNOSIS — F17210 Nicotine dependence, cigarettes, uncomplicated: Secondary | ICD-10-CM | POA: Diagnosis not present

## 2020-06-17 DIAGNOSIS — I6523 Occlusion and stenosis of bilateral carotid arteries: Secondary | ICD-10-CM | POA: Insufficient documentation

## 2020-06-17 DIAGNOSIS — K219 Gastro-esophageal reflux disease without esophagitis: Secondary | ICD-10-CM | POA: Diagnosis not present

## 2020-06-17 DIAGNOSIS — Z7982 Long term (current) use of aspirin: Secondary | ICD-10-CM | POA: Diagnosis not present

## 2020-06-17 DIAGNOSIS — Z79899 Other long term (current) drug therapy: Secondary | ICD-10-CM | POA: Diagnosis not present

## 2020-06-17 DIAGNOSIS — I70221 Atherosclerosis of native arteries of extremities with rest pain, right leg: Secondary | ICD-10-CM

## 2020-06-17 HISTORY — PX: LOWER EXTREMITY ANGIOGRAPHY: CATH118251

## 2020-06-17 LAB — CREATININE, SERUM
Creatinine, Ser: 0.62 mg/dL (ref 0.44–1.00)
GFR, Estimated: >60 mL/min (ref >60)

## 2020-06-17 LAB — BUN: BUN: 12 mg/dL (ref 8–23)

## 2020-06-17 SURGERY — LOWER EXTREMITY ANGIOGRAPHY
Anesthesia: Moderate Sedation | Site: Leg Lower | Laterality: Right

## 2020-06-17 MED ORDER — HEPARIN SODIUM (PORCINE) 1000 UNIT/ML IJ SOLN
INTRAMUSCULAR | Status: AC
  Filled 2020-06-17: qty 1

## 2020-06-17 MED ORDER — CEFAZOLIN SODIUM-DEXTROSE 2-4 GM/100ML-% IV SOLN: 2.0000 g | Freq: Once | INTRAVENOUS | Status: AC

## 2020-06-17 MED ORDER — METHYLPREDNISOLONE SODIUM SUCC 125 MG IJ SOLR: 125.0000 mg | Freq: Once | INTRAMUSCULAR | Status: DC | PRN

## 2020-06-17 MED ORDER — HYDRALAZINE HCL 20 MG/ML IJ SOLN
INTRAMUSCULAR | Status: AC
  Filled 2020-06-17: qty 1

## 2020-06-17 MED ORDER — HYDROCODONE-ACETAMINOPHEN 5-325 MG PO TABS: 1.0000 | ORAL_TABLET | Freq: Four times a day (QID) | ORAL | 0 refills | Status: DC | PRN

## 2020-06-17 MED ORDER — SODIUM CHLORIDE 0.9% FLUSH: 3.0000 mL | INTRAVENOUS | Status: DC | PRN

## 2020-06-17 MED ORDER — SODIUM CHLORIDE 0.9% FLUSH: 3.0000 mL | Freq: Two times a day (BID) | INTRAVENOUS | Status: DC

## 2020-06-17 MED ORDER — HYDROMORPHONE HCL 1 MG/ML IJ SOLN: 1.0000 mg | Freq: Once | INTRAMUSCULAR | Status: DC | PRN

## 2020-06-17 MED ORDER — SIMVASTATIN 10 MG PO TABS: 10.0000 mg | ORAL_TABLET | Freq: Every evening | ORAL | 2 refills | Status: DC

## 2020-06-17 MED ORDER — OXYCODONE HCL 5 MG PO TABS: 5.0000 mg | ORAL_TABLET | ORAL | Status: DC | PRN

## 2020-06-17 MED ORDER — FENTANYL CITRATE (PF) 100 MCG/2ML IJ SOLN
INTRAMUSCULAR | Status: AC
  Filled 2020-06-17: qty 2

## 2020-06-17 MED ORDER — ACETAMINOPHEN 325 MG PO TABS: 650.0000 mg | ORAL_TABLET | ORAL | Status: DC | PRN

## 2020-06-17 MED ORDER — CLOPIDOGREL BISULFATE 75 MG PO TABS
ORAL_TABLET | ORAL | Status: AC
  Filled 2020-06-17: qty 4

## 2020-06-17 MED ORDER — ONDANSETRON HCL 4 MG/2ML IJ SOLN: 4.0000 mg | Freq: Four times a day (QID) | INTRAMUSCULAR | Status: DC | PRN

## 2020-06-17 MED ORDER — HYDRALAZINE HCL 20 MG/ML IJ SOLN: 5.0000 mg | INTRAMUSCULAR | Status: DC | PRN

## 2020-06-17 MED ORDER — CLOPIDOGREL BISULFATE 75 MG PO TABS: 75.0000 mg | ORAL_TABLET | Freq: Every day | ORAL | 11 refills | Status: DC

## 2020-06-17 MED ORDER — MORPHINE SULFATE (PF) 4 MG/ML IV SOLN: 2.0000 mg | INTRAVENOUS | Status: DC | PRN

## 2020-06-17 MED ORDER — FAMOTIDINE 20 MG PO TABS: 40.0000 mg | ORAL_TABLET | Freq: Once | ORAL | Status: DC | PRN

## 2020-06-17 MED ORDER — CEFAZOLIN SODIUM-DEXTROSE 2-4 GM/100ML-% IV SOLN
INTRAVENOUS | Status: AC
  Administered 2020-06-17: 2 g via INTRAVENOUS
  Filled 2020-06-17: qty 100

## 2020-06-17 MED ORDER — CLOPIDOGREL BISULFATE 300 MG PO TABS
300.0000 mg | ORAL_TABLET | Freq: Every day | ORAL | Status: DC
  Administered 2020-06-17: 300 mg via ORAL

## 2020-06-17 MED ORDER — HEPARIN SODIUM (PORCINE) 1000 UNIT/ML IJ SOLN
INTRAMUSCULAR | Status: DC | PRN
  Administered 2020-06-17: 5000 [IU] via INTRAVENOUS

## 2020-06-17 MED ORDER — MIDAZOLAM HCL 2 MG/2ML IJ SOLN
INTRAMUSCULAR | Status: DC | PRN
  Administered 2020-06-17: 2 mg via INTRAVENOUS
  Administered 2020-06-17 (×3): 1 mg via INTRAVENOUS

## 2020-06-17 MED ORDER — LABETALOL HCL 5 MG/ML IV SOLN
10.0000 mg | INTRAVENOUS | Status: DC | PRN
Start: 2020-06-17 — End: 2020-06-18

## 2020-06-17 MED ORDER — DIPHENHYDRAMINE HCL 50 MG/ML IJ SOLN: 50.0000 mg | Freq: Once | INTRAMUSCULAR | Status: DC | PRN

## 2020-06-17 MED ORDER — OXYCODONE HCL 5 MG PO TABS
ORAL_TABLET | ORAL | Status: AC
  Administered 2020-06-17: 5 mg via ORAL
  Filled 2020-06-17: qty 1

## 2020-06-17 MED ORDER — IODIXANOL 320 MG/ML IV SOLN
INTRAVENOUS | Status: DC | PRN
  Administered 2020-06-17: 80 mL

## 2020-06-17 MED ORDER — FENTANYL CITRATE (PF) 100 MCG/2ML IJ SOLN
INTRAMUSCULAR | Status: DC | PRN
  Administered 2020-06-17 (×2): 50 ug via INTRAVENOUS

## 2020-06-17 MED ORDER — MIDAZOLAM HCL 5 MG/5ML IJ SOLN
INTRAMUSCULAR | Status: AC
  Filled 2020-06-17: qty 5

## 2020-06-17 MED ORDER — SODIUM CHLORIDE 0.9 % IV SOLN: INTRAVENOUS | Status: DC

## 2020-06-17 MED ORDER — ASPIRIN EC 81 MG PO TBEC: 81.0000 mg | DELAYED_RELEASE_TABLET | Freq: Every day | ORAL | 2 refills | Status: DC

## 2020-06-17 MED ORDER — HYDRALAZINE HCL 20 MG/ML IJ SOLN
INTRAMUSCULAR | Status: DC | PRN
  Administered 2020-06-17: 10 mg via INTRAVENOUS

## 2020-06-17 MED ORDER — SODIUM CHLORIDE 0.9 % IV SOLN: 250.0000 mL | INTRAVENOUS | Status: DC | PRN

## 2020-06-17 MED ORDER — MIDAZOLAM HCL 2 MG/ML PO SYRP: 8.0000 mg | ORAL_SOLUTION | Freq: Once | ORAL | Status: DC | PRN

## 2020-06-17 SURGICAL SUPPLY — 20 items
BALLN LUTONIX 018 4X220X130 (BALLOONS) ×2
BALLN LUTONIX 018 5X300X130 (BALLOONS) ×2
BALLN ULTRVRSE 3X80X150 (BALLOONS) ×1
BALLN ULTRVRSE 3X80X150 OTW (BALLOONS) ×1
BALLOON LUTONIX 018 4X220X130 (BALLOONS) ×1 IMPLANT
BALLOON LUTONIX 018 5X300X130 (BALLOONS) ×1 IMPLANT
BALLOON ULTRVRSE 3X80X150 OTW (BALLOONS) ×1 IMPLANT
CANNULA 5F STIFF (CANNULA) ×2 IMPLANT
CATH ANGIO 5F PIGTAIL 65CM (CATHETERS) ×2 IMPLANT
DEVICE STARCLOSE SE CLOSURE (Vascular Products) ×2 IMPLANT
GLIDECATH ANGLED 4FR 120CM (CATHETERS) ×2 IMPLANT
GLIDEWIRE ADV .035X260CM (WIRE) ×2 IMPLANT
KIT ENCORE 26 ADVANTAGE (KITS) ×2 IMPLANT
PACK ANGIOGRAPHY (CUSTOM PROCEDURE TRAY) ×2 IMPLANT
SHEATH ANL2 6FRX45 HC (SHEATH) ×2 IMPLANT
SHEATH BRITE TIP 5FRX11 (SHEATH) ×2 IMPLANT
SYR MEDRAD MARK 7 150ML (SYRINGE) ×2 IMPLANT
TUBING CONTRAST HIGH PRESS 72 (TUBING) ×2 IMPLANT
WIRE G V18X300CM (WIRE) ×2 IMPLANT
WIRE GUIDERIGHT .035X150 (WIRE) ×2 IMPLANT

## 2020-06-17 NOTE — Discharge Instructions (Signed)

## 2020-06-17 NOTE — Op Note (Signed)
[facility] Percutaneous Study/Intervention Procedural Note   Date of Surgery: 06/17/2020  Surgeon: Hortencia Pilar  Pre-operative Diagnosis: Atherosclerotic occlusive disease bilateral lower extremities with right lower extremity  Post-operative diagnosis: Same  Procedure(s) Performed: 1. Introduction catheter into right lower extremity 3rd order catheter placement  2. Contrast injection right lower extremity for distal runoff  3. Percutaneous transluminal angioplasty right superficial femoral and popliteal arteries to 5 mm with Lutonix drug-eluting balloon 4. Percutaneous transluminal angioplasty right peroneal artery to 3 mm within Ultraverse balloon  5. Star close closure left femoral arteriotomy  Anesthesia: Conscious sedation was administered under my direct supervision by the interventional radiology RN. IV Versed plus fentanyl were utilized. Continuous ECG, pulse oximetry and blood pressure was monitored throughout the entire procedure.  Conscious sedation was for a total of 51 minutes.  Sheath: 6 [place] Ansell sheath left femoral retrograde  Contrast: 80 cc  Fluoroscopy Time: 51 minutes  Indications: Jackie Macias presents with increasing pain of the right lower extremity.  Noninvasive studies as well as physical examination suggest significant deterioration in her vascular disease.  This suggests the patient is having limb threatening ischemia. The risks and benefits are reviewed all questions answered patient agrees to proceed with angiography and intervention for limb salvage.  Procedure:Jackie Macias is a 65 y.o. y.o. female who was identified and appropriate procedural time out was performed. The patient was then placed supine on the table and prepped and draped in the usual sterile fashion.   Ultrasound was placed in the sterile sleeve and the left groin was evaluated the  left femoral artery was echolucent and pulsatile indicating patency. Image was recorded for the permanent record and under real-time visualization a microneedle was inserted into the common femoral artery followed by the microwire and then the micro-sheath. A J-wire was then advanced through the micro-sheath and a 5 [place] sheath was then inserted over a J-wire. J-wire was then advanced and a 5 French pigtail catheter was positioned at the level of T12.  AP projection of the aorta was then obtained. Pigtail catheter was repositioned to above the bifurcation and a LAO view of the pelvis was obtained. Subsequently a pigtail catheter with the stiff angle Glidewire was used to cross the aortic bifurcation the catheter wire were advanced down into the right distal external iliac artery. Oblique view of the femoral bifurcation was then obtained and subsequently the wire was reintroduced and the pigtail catheter negotiated into the SFA representing third order catheter placement. Distal runoff was then performed.  Diagnostic interpretation: The abdominal aorta is opacified with a bolus injection contrast.  There is diffuse calcific disease noted but there are no hemodynamically significant stenoses.  The aortic bifurcation is widely patent.  The bilateral common iliac arteries are diffusely diseased but widely patent.  There is mild to moderate disease in the origin of the right external iliac artery 40% at most.  Beyond this lesion the external iliac artery is widely patent.  Left external iliac artery is widely patent.  The right common femoral demonstrates moderate calcific plaque with 30 to 40% stenosis in its midportion.  The ostia to the profunda femoris which is multiple branches and the superficial femoral arteries are widely patent.  The profunda femoris is diffusely diseased but widely patent.  The superficial femoral artery and the popliteal artery demonstrate diffuse stenoses with 4 or 5 greater than  80% strictures.  Each of the strictures is 15 to 20 mm in length.  Within the popliteal there is a previously placed stent.  This stent demonstrates 2 focal greater than 80% stenoses within it.  The trifurcation is diffusely diseased with occlusion of the anterior tibial and posterior tibial arteries throughout their entire course.  The peroneal artery is the single-vessel runoff to the foot collateralizing to the plantar arteries primarily.  In the proximal force to 5 cm of the peroneal there is a greater than 70% smooth narrowing.  Beyond this lesion the peroneal is widely patent.  5000 units of heparin was then given and allowed to circulate and a 6 [place] Ansell sheath was advanced up and over the bifurcation and positioned in the femoral artery  KMP catheter and advantage Glidewire were then negotiated down into the distal popliteal and then the peroneal. Catheter was then advanced into the peroneal. Hand injection contrast demonstrated the tibial anatomy in detail.  3 mm x 80 mm Ultraverse balloon was then advanced across the proximal peroneal lesion inflation was to 8 atm for 1 full minute.  Follow-up imaging demonstrated less than 5% residual stenosis with preservation of distal runoff.    The detector was then repositioned and the SFA and popliteal was reimaged multiple lesions as described above are again identified and after appropriate measurements are made a 4 mm x 150 mm Lutonix balloon was used to angioplasty the superficial femoral and popliteal arteries beginning in the distal popliteal working proximally.  Next a 5 mm x 300 mm Lutonix drug-eluting balloon was advanced this time covering the proximal lesion and extending distally.  These 2 balloons overlapped by several centimeters effectively treating the entire SFA and popliteal from near the SFA origin down to the level of the peroneal.  Inflations were to 12 atmospheres for 2 minutes. Follow-up imaging demonstrated patency with excellent  result there is less than 10% residual stenosis throughout the entire SFA popliteal system. Distal runoff was then reassessed and noted to be widely patent.   After review of these images the sheath is pulled into the left external iliac oblique of the common femoral is obtained and a Star close device deployed. There no immediate Complications.  Findings:  The abdominal aorta is opacified with a bolus injection contrast.  There is diffuse calcific disease noted but there are no hemodynamically significant stenoses.  The aortic bifurcation is widely patent.  The bilateral common iliac arteries are diffusely diseased but widely patent.  There is mild to moderate disease in the origin of the right external iliac artery 40% at most.  Beyond this lesion the external iliac artery is widely patent.  Left external iliac artery is widely patent.  The right common femoral demonstrates moderate calcific plaque with 30 to 40% stenosis in its midportion.  The ostia to the profunda femoris which is multiple branches and the superficial femoral arteries are widely patent.  The profunda femoris is diffusely diseased but widely patent.  The superficial femoral artery and the popliteal artery demonstrate diffuse stenoses with 4 or 5 greater than 80% strictures.  Each of the strictures is 15 to 20 mm in length.  Within the popliteal there is a previously placed stent.  This stent demonstrates 2 focal greater than 80% stenoses within it.  The trifurcation is diffusely diseased with occlusion of the anterior tibial and posterior tibial arteries throughout their entire course.  The peroneal artery is the single-vessel runoff to the foot collateralizing to the plantar arteries primarily.  In the proximal force to 5 cm of the peroneal there  is a greater than 70% smooth narrowing.  Beyond this lesion the peroneal is widely patent.  Following angioplasty the peroneal now has in-line flow and looks quite nice with less than 5%  residual stenosis. Angioplasty of the SFA and popliteal yields an excellent result with less than 10% residual stenosis.  Summary: Successful recanalization right lower extremity for limb salvage   Disposition: Patient was taken to the recovery room in stable condition having tolerated the procedure well.  Belenda Cruise Schnier 06/17/2020,2:08 PM

## 2020-06-17 NOTE — Interval H&P Note (Signed)
History and Physical Interval Note:  06/17/2020 11:53 AM  Jackie Macias  has presented today for surgery, with the diagnosis of RT LE Angio  BARD    ASO w ulceration Covid  March 18.  The various methods of treatment have been discussed with the patient and family. After consideration of risks, benefits and other options for treatment, the patient has consented to  Procedure(s): LOWER EXTREMITY ANGIOGRAPHY (Right) as a surgical intervention.  The patient's history has been reviewed, patient examined, no change in status, stable for surgery.  I have reviewed the patient's chart and labs.  Questions were answered to the patient's satisfaction.     Hortencia Pilar

## 2020-06-18 ENCOUNTER — Encounter: Payer: Self-pay | Admitting: Vascular Surgery

## 2020-06-24 ENCOUNTER — Other Ambulatory Visit (INDEPENDENT_AMBULATORY_CARE_PROVIDER_SITE_OTHER): Payer: Self-pay | Admitting: Vascular Surgery

## 2020-06-24 DIAGNOSIS — Z9862 Peripheral vascular angioplasty status: Secondary | ICD-10-CM

## 2020-06-24 DIAGNOSIS — I739 Peripheral vascular disease, unspecified: Secondary | ICD-10-CM

## 2020-06-25 NOTE — Progress Notes (Deleted)
MRN : [id number]  Jackie Macias is a 65 y.o. ([birth year]) female who presents with chief complaint of No chief complaint on file. Marland Kitchen  History of Present Illness:   The patient returns to the office for followup and review status post angiogram with intervention on 06/17/2020.   Procedure:  Percutaneous transluminal angioplasty right superficial femoral and popliteal arteries to 5 mm with Lutonix drug-eluting balloon.  Percutaneous transluminal angioplasty right peroneal artery to 3 mm within Ultraverse balloon  The patient notes improvement in the lower extremity symptoms. No interval shortening of the patient's claudication distance or rest pain symptoms. Previous wounds have now healed.  No new ulcers or wounds have occurred since the last visit.  There have been no significant changes to the patient's overall health care.  The patient denies amaurosis fugax or recent TIA symptoms. There are no recent neurological changes noted. The patient denies history of DVT, PE or superficial thrombophlebitis. The patient denies recent episodes of angina or shortness of breath.   ABI's Rt=*** and Lt=***  (previous ABI's Rt=*** and Lt=***) Duplex US of the *** lower extremity arterial system shows ***  No outpatient medications have been marked as taking for the 06/26/20 encounter (Appointment) with Delana Meyer, Dolores Lory, MD.    Past Medical History:  Diagnosis Date  . Arthritis   . Bronchitis   . COPD (chronic obstructive pulmonary disease) ()   . GERD (gastroesophageal reflux disease)   . Gout   . Hypertension     Past Surgical History:  Procedure Laterality Date  . AMPUTATION Right 07/29/2017   Procedure: AMPUTATION DIGIT/ 95621;  Surgeon: Sharlotte Alamo, DPM;  Location: [facility];  Service: Podiatry;  Laterality: Right;  . CHOLECYSTECTOMY    . facial biopsy Right   . JOINT REPLACEMENT Right 12/2016   THR  . LOWER EXTREMITY ANGIOGRAPHY Right 09/27/2017   Procedure: LOWER EXTREMITY  ANGIOGRAPHY;  Surgeon: Katha Cabal, MD;  Location: [facility];  Service: Cardiovascular;  Laterality: Right;  . LOWER EXTREMITY ANGIOGRAPHY Right 06/17/2020   Procedure: LOWER EXTREMITY ANGIOGRAPHY;  Surgeon: Katha Cabal, MD;  Location: [facility];  Service: Cardiovascular;  Laterality: Right;  . TONSILLECTOMY    . TOTAL HIP ARTHROPLASTY Right 01/17/2017   Procedure: TOTAL HIP ARTHROPLASTY ANTERIOR APPROACH;  Surgeon: Lovell Sheehan, MD;  Location: [facility];  Service: Orthopedics;  Laterality: Right;    Social History Social History   Tobacco Use  . Smoking status: Current Every Day Smoker    Packs/day: 0.50    Types: Cigarettes  . Smokeless tobacco: Former Network engineer  . Vaping Use: Never used  Substance Use Topics  . Alcohol use: Yes    Alcohol/week: 3.0 standard drinks    Types: 3 Cans of beer per week    Comment: occasional  . Drug use: No    Family History Family History  Problem Relation Age of Onset  . Breast cancer Sister 71  . Breast cancer Other     No Known Allergies   REVIEW OF SYSTEMS (Negative unless checked)  Constitutional: [] Weight loss  [] Fever  [] Chills Cardiac: [] Chest pain   [] Chest pressure   [] Palpitations   [] Shortness of breath when laying flat   [] Shortness of breath with exertion. Vascular:  [x] Pain in legs with walking   [] Pain in legs at rest  [] History of DVT   [] Phlebitis   [] Swelling in legs   [] Varicose veins   [] Non-healing ulcers Pulmonary:   []   Uses home oxygen   [] Productive cough   [] Hemoptysis   [] Wheeze  [] COPD   [] Asthma Neurologic:  [] Dizziness   [] Seizures   [] History of stroke   [] History of TIA  [] Aphasia   [] Vissual changes   [] Weakness or numbness in arm   [] Weakness or numbness in leg Musculoskeletal:   [] Joint swelling   [] Joint pain   [] Low back pain Hematologic:  [] Easy bruising  [] Easy bleeding   [] Hypercoagulable state   [] Anemic Gastrointestinal:  [] Diarrhea   [] Vomiting   [] Gastroesophageal reflux/heartburn   [] Difficulty swallowing. Genitourinary:  [] Chronic kidney disease   [] Difficult urination  [] Frequent urination   [] Blood in urine Skin:  [] Rashes   [] Ulcers  Psychological:  [] History of anxiety   []  History of major depression.  Physical Examination  There were no vitals filed for this visit. There is no height or weight on file to calculate BMI. Gen: WD/WN, NAD Head: [place]/AT, No temporalis wasting.  Ear/Nose/Throat: Hearing grossly intact, nares w/o erythema or drainage Eyes: PER, EOMI, sclera nonicteric.  Neck: Supple, no large masses.   Pulmonary:  Good air movement, no audible wheezing bilaterally, no use of accessory muscles.  Cardiac: RRR, no JVD Vascular:  Vessel Right Left  Radial Palpable Palpable  PT Palpable Palpable  DP Palpable Palpable  Gastrointestinal: Non-distended. No guarding/no peritoneal signs.  Musculoskeletal: M/S 5/5 throughout.  No deformity or atrophy.  Neurologic: CN 2-12 intact. Symmetrical.  Speech is fluent. Motor exam as listed above. Psychiatric: Judgment intact, Mood & affect appropriate for pt's clinical situation. Dermatologic: No rashes or ulcers noted.  No changes consistent with cellulitis. Lymph : No lichenification or skin changes of chronic lymphedema.  CBC Lab Results  Component Value Date   WBC 4.6 09/15/2018   HGB 12.5 09/15/2018   HCT 37.8 09/15/2018   MCV 102.4 (H) 09/15/2018   PLT 259 09/15/2018    BMET    Component Value Date/Time   NA 139 09/15/2018 1742   K 4.0 09/15/2018 1742   CL 104 09/15/2018 1742   CO2 26 09/15/2018 1742   GLUCOSE 125 (H) 09/15/2018 1742   BUN 12 06/17/2020 1120   CREATININE 0.62 06/17/2020 1120   CALCIUM 9.6 09/15/2018 1742   GFRNONAA >60 06/17/2020 1120   GFRAA >60 09/15/2018 1742   Estimated Creatinine Clearance: 84.8 mL/min (by C-G formula based on SCr of 0.62 mg/dL).  COAG Lab Results  Component Value Date   INR 1.04 01/05/2017     Radiology PERIPHERAL VASCULAR CATHETERIZATION  Result Date: 06/18/2020 See Op Note  ABI WITH/WO TBI  Result Date: 06/09/2020 LOWER EXTREMITY DOPPLER STUDY Indications: Ulceration, gangrene, and peripheral artery disease.  Performing Technologist: Charlane Ferretti RT (R)(VS)  Examination Guidelines: A complete evaluation includes at minimum, Doppler waveform signals and systolic blood pressure reading at the level of bilateral brachial, anterior tibial, and posterior tibial arteries, when vessel segments are accessible. Bilateral testing is considered an integral part of a complete examination. Photoelectric Plethysmograph (PPG) waveforms and toe systolic pressure readings are included as required and additional duplex testing as needed. Limited examinations for reoccurring indications may be performed as noted.  ABI Findings: +---------+------------------+-----+----------+--------+ Right    Rt Pressure (mmHg)IndexWaveform  Comment  +---------+------------------+-----+----------+--------+ Brachial 165                                       +---------+------------------+-----+----------+--------+ ATA      88  0.53 monophasic         +---------+------------------+-----+----------+--------+ PTA      71                0.43 monophasic         +---------+------------------+-----+----------+--------+ Great Toe18                0.11 Abnormal  2nd toe  +---------+------------------+-----+----------+--------+ +---------+------------------+-----+----------+-------+ Left     Lt Pressure (mmHg)IndexWaveform  Comment +---------+------------------+-----+----------+-------+ Brachial 165                                      +---------+------------------+-----+----------+-------+ ATA      106               0.64 monophasic        +---------+------------------+-----+----------+-------+ PTA      92                0.56 monophasic         +---------+------------------+-----+----------+-------+ Great Toe60                0.36 Abnormal          +---------+------------------+-----+----------+-------+ Summary: Right: Resting right ankle-brachial index indicates moderate right lower extremity arterial disease. The right toe-brachial index is abnormal. Left: Resting left ankle-brachial index indicates moderate left lower extremity arterial disease. The left toe-brachial index is abnormal. *See table(s) above for measurements and observations.  Electronically signed by Hortencia Pilar MD on 06/09/2020 at 12:42:38 PM.   Final      Assessment/Plan There are no diagnoses linked to this encounter.   Hortencia Pilar, MD  06/25/2020 8:28 PM

## 2020-06-26 ENCOUNTER — Encounter (INDEPENDENT_AMBULATORY_CARE_PROVIDER_SITE_OTHER): Payer: Medicare HMO

## 2020-06-26 ENCOUNTER — Ambulatory Visit (INDEPENDENT_AMBULATORY_CARE_PROVIDER_SITE_OTHER): Payer: Medicare HMO | Admitting: Vascular Surgery

## 2020-06-26 DIAGNOSIS — I10 Essential (primary) hypertension: Secondary | ICD-10-CM

## 2020-06-26 DIAGNOSIS — I6523 Occlusion and stenosis of bilateral carotid arteries: Secondary | ICD-10-CM

## 2020-06-26 DIAGNOSIS — K219 Gastro-esophageal reflux disease without esophagitis: Secondary | ICD-10-CM

## 2020-06-26 DIAGNOSIS — I739 Peripheral vascular disease, unspecified: Secondary | ICD-10-CM

## 2020-07-14 ENCOUNTER — Ambulatory Visit (INDEPENDENT_AMBULATORY_CARE_PROVIDER_SITE_OTHER): Payer: Medicare HMO | Admitting: Vascular Surgery

## 2020-07-14 ENCOUNTER — Encounter (INDEPENDENT_AMBULATORY_CARE_PROVIDER_SITE_OTHER): Payer: Medicare HMO

## 2020-07-21 ENCOUNTER — Other Ambulatory Visit: Payer: Self-pay | Admitting: Family

## 2020-07-21 DIAGNOSIS — Z1231 Encounter for screening mammogram for malignant neoplasm of breast: Secondary | ICD-10-CM

## 2020-07-28 ENCOUNTER — Other Ambulatory Visit: Payer: Self-pay

## 2020-07-28 ENCOUNTER — Ambulatory Visit (INDEPENDENT_AMBULATORY_CARE_PROVIDER_SITE_OTHER): Payer: Medicare HMO | Admitting: Vascular Surgery

## 2020-07-28 ENCOUNTER — Encounter (INDEPENDENT_AMBULATORY_CARE_PROVIDER_SITE_OTHER): Payer: Self-pay | Admitting: Vascular Surgery

## 2020-07-28 ENCOUNTER — Ambulatory Visit (INDEPENDENT_AMBULATORY_CARE_PROVIDER_SITE_OTHER): Payer: Medicare HMO

## 2020-07-28 VITALS — BP 185/77 | HR 79 | Resp 16 | Wt 226.8 lb

## 2020-07-28 DIAGNOSIS — I70219 Atherosclerosis of native arteries of extremities with intermittent claudication, unspecified extremity: Secondary | ICD-10-CM | POA: Insufficient documentation

## 2020-07-28 DIAGNOSIS — I70213 Atherosclerosis of native arteries of extremities with intermittent claudication, bilateral legs: Secondary | ICD-10-CM

## 2020-07-28 DIAGNOSIS — E782 Mixed hyperlipidemia: Secondary | ICD-10-CM

## 2020-07-28 DIAGNOSIS — I739 Peripheral vascular disease, unspecified: Secondary | ICD-10-CM

## 2020-07-28 DIAGNOSIS — Z9862 Peripheral vascular angioplasty status: Secondary | ICD-10-CM

## 2020-07-28 DIAGNOSIS — E785 Hyperlipidemia, unspecified: Secondary | ICD-10-CM | POA: Insufficient documentation

## 2020-07-28 DIAGNOSIS — I6523 Occlusion and stenosis of bilateral carotid arteries: Secondary | ICD-10-CM

## 2020-07-28 DIAGNOSIS — I10 Essential (primary) hypertension: Secondary | ICD-10-CM

## 2020-07-28 NOTE — Progress Notes (Signed)
MRN : [id number]  Jackie Macias is a 65 y.o. (1955-11-21) female who presents with chief complaint of No chief complaint on file. Marland Kitchen  History of Present Illness:   The patient returns to the office for followup and review status post angiogram with intervention.   Procedure date 06/17/2020: Percutaneous transluminal angioplasty right superficial femoral and popliteal arteries to 5 mm with Lutonix drug-eluting balloon with percutaneous transluminal angioplasty right peroneal artery to 3 mm within Ultraverse balloon  The patient notes improvement in the lower extremity symptoms. No interval shortening of the patient's claudication distance or rest pain symptoms. Previous wounds have now healed.  No new ulcers or wounds have occurred since the last visit.  There have been no significant changes to the patient's overall health care.  The patient denies amaurosis fugax or recent TIA symptoms. There are no recent neurological changes noted. The patient denies history of DVT, PE or superficial thrombophlebitis. The patient denies recent episodes of angina or shortness of breath.   ABI's Rt=0.91 and Lt=0.61  (previous ABI's Rt=0.53 and Lt=0.64) significant improvement over previous value.   No outpatient medications have been marked as taking for the 07/28/20 encounter (Appointment) with Delana Meyer, Dolores Lory, MD.    Past Medical History:  Diagnosis Date  . Arthritis   . Bronchitis   . COPD (chronic obstructive pulmonary disease) ([place])   . GERD (gastroesophageal reflux disease)   . Gout   . Hypertension     Past Surgical History:  Procedure Laterality Date  . AMPUTATION Right 07/29/2017   Procedure: AMPUTATION DIGIT/ 51761;  Surgeon: Sharlotte Alamo, DPM;  Location: [facility];  Service: Podiatry;  Laterality: Right;  . CHOLECYSTECTOMY    . facial biopsy Right   . JOINT REPLACEMENT Right 12/2016   THR  . LOWER EXTREMITY ANGIOGRAPHY Right 09/27/2017   Procedure: LOWER EXTREMITY ANGIOGRAPHY;   Surgeon: Katha Cabal, MD;  Location: [place] [facility];  Service: Cardiovascular;  Laterality: Right;  . LOWER EXTREMITY ANGIOGRAPHY Right 06/17/2020   Procedure: LOWER EXTREMITY ANGIOGRAPHY;  Surgeon: Katha Cabal, MD;  Location: [place] [facility];  Service: Cardiovascular;  Laterality: Right;  . TONSILLECTOMY    . TOTAL HIP ARTHROPLASTY Right 01/17/2017   Procedure: TOTAL HIP ARTHROPLASTY ANTERIOR APPROACH;  Surgeon: Lovell Sheehan, MD;  Location: [facility];  Service: Orthopedics;  Laterality: Right;    Social History Social History   Tobacco Use  . Smoking status: Current Every Day Smoker    Packs/day: 0.50    Types: Cigarettes  . Smokeless tobacco: Former Network engineer  . Vaping Use: Never used  Substance Use Topics  . Alcohol use: Yes    Alcohol/week: 3.0 standard drinks    Types: 3 Cans of beer per week    Comment: occasional  . Drug use: No    Family History Family History  Problem Relation Age of Onset  . Breast cancer Sister 68  . Breast cancer Other     No Known Allergies   REVIEW OF SYSTEMS (Negative unless checked)  Constitutional: [] Weight loss  [] Fever  [] Chills Cardiac: [] Chest pain   [] Chest pressure   [] Palpitations   [] Shortness of breath when laying flat   [] Shortness of breath with exertion. Vascular:  [x] Pain in legs with walking   [x] Pain in legs at rest  [] History of DVT   [] Phlebitis   [] Swelling in legs   [] Varicose veins   [] Non-healing ulcers Pulmonary:   [] Uses home oxygen   []   Productive cough   [] Hemoptysis   [] Wheeze  [] COPD   [] Asthma Neurologic:  [] Dizziness   [] Seizures   [] History of stroke   [] History of TIA  [] Aphasia   [] Vissual changes   [] Weakness or numbness in arm   [] Weakness or numbness in leg Musculoskeletal:   [] Joint swelling   [x] Joint pain   [x] Low back pain Hematologic:  [] Easy bruising  [] Easy bleeding   [] Hypercoagulable state   [] Anemic Gastrointestinal:  [] Diarrhea   [] Vomiting  [] Gastroesophageal  reflux/heartburn   [] Difficulty swallowing. Genitourinary:  [] Chronic kidney disease   [] Difficult urination  [] Frequent urination   [] Blood in urine Skin:  [] Rashes   [] Ulcers  Psychological:  [] History of anxiety   []  History of major depression.  Physical Examination  There were no vitals filed for this visit. There is no height or weight on file to calculate BMI. Gen: WD/WN, NAD Head: [place]/AT, No temporalis wasting.  Ear/Nose/Throat: Hearing grossly intact, nares w/o erythema or drainage Eyes: PER, EOMI, sclera nonicteric.  Neck: Supple, no large masses.   Pulmonary:  Good air movement, no audible wheezing bilaterally, no use of accessory muscles.  Cardiac: RRR, no JVD Vascular: Vessel Right Left  Radial Palpable Palpable  PT  not palpable  not palpable  DP  not palpable  not palpable  Gastrointestinal: Non-distended. No guarding/no peritoneal signs.  Musculoskeletal: M/S 5/5 throughout.  No deformity or atrophy.  Neurologic: CN 2-12 intact. Symmetrical.  Speech is fluent. Motor exam as listed above. Psychiatric: Judgment intact, Mood & affect appropriate for pt's clinical situation. Dermatologic: No rashes or ulcers noted.  No changes consistent with cellulitis.  CBC Lab Results  Component Value Date   WBC 4.6 09/15/2018   HGB 12.5 09/15/2018   HCT 37.8 09/15/2018   MCV 102.4 (H) 09/15/2018   PLT 259 09/15/2018    BMET    Component Value Date/Time   NA 139 09/15/2018 1742   K 4.0 09/15/2018 1742   CL 104 09/15/2018 1742   CO2 26 09/15/2018 1742   GLUCOSE 125 (H) 09/15/2018 1742   BUN 12 06/17/2020 1120   CREATININE 0.62 06/17/2020 1120   CALCIUM 9.6 09/15/2018 1742   GFRNONAA >60 06/17/2020 1120   GFRAA >60 09/15/2018 1742   CrCl cannot be calculated (Patient's most recent lab result is older than the maximum 21 days allowed.).  COAG Lab Results  Component Value Date   INR 1.04 01/05/2017    Radiology No results found.   Assessment/Plan 1.  Atherosclerosis of native artery of both lower extremities with intermittent claudication (HCC) Recommend:  The patient is status post successful angiogram with intervention.  The patient reports that the claudication symptoms and leg pain is essentially gone.   The patient denies lifestyle limiting changes at this point in time.  No further invasive studies, angiography or surgery at this time The patient should continue walking and begin a more formal exercise program.  The patient should continue antiplatelet therapy and aggressive treatment of the lipid abnormalities  Smoking cessation was again discussed  The patient should continue wearing graduated compression socks 10-15 mmHg strength to control the mild edema.  Patient should undergo noninvasive studies as ordered. The patient will follow up with me after the studies.   - VAS [place] LOWER EXTREMITY ARTERIAL DUPLEX; Future - VAS [place] ABI WITH/WO TBI; Future  2. Bilateral carotid artery stenosis Recommend:  Given the patient's asymptomatic subcritical stenosis no further invasive testing or surgery at this time.  Continue antiplatelet therapy  as prescribed Continue management of CAD, HTN and Hyperlipidemia Healthy heart diet,  encouraged exercise at least 4 times per week Follow up at 12 month intervals with duplex ultrasound and physical exam   3. Essential hypertension Continue antihypertensive medications as already ordered, these medications have been reviewed and there are no changes at this time.   4. Mixed hyperlipidemia Continue statin as ordered and reviewed, no changes at this time    Hortencia Pilar, MD  07/28/2020 8:46 AM

## 2020-08-05 ENCOUNTER — Ambulatory Visit
Admission: RE | Admit: 2020-08-05 | Discharge: 2020-08-05 | Disposition: A | Discharge status: Home / Self Care | Payer: Medicare HMO | Source: Ambulatory Visit | Attending: Family | Admitting: Family

## 2020-08-05 ENCOUNTER — Other Ambulatory Visit: Payer: Self-pay

## 2020-08-05 DIAGNOSIS — Z1231 Encounter for screening mammogram for malignant neoplasm of breast: Secondary | ICD-10-CM | POA: Insufficient documentation

## 2020-08-11 ENCOUNTER — Other Ambulatory Visit: Payer: Self-pay | Admitting: Family

## 2020-08-11 DIAGNOSIS — R928 Other abnormal and inconclusive findings on diagnostic imaging of breast: Secondary | ICD-10-CM

## 2020-08-11 DIAGNOSIS — N6320 Unspecified lump in the left breast, unspecified quadrant: Secondary | ICD-10-CM

## 2020-08-27 DIAGNOSIS — I739 Peripheral vascular disease, unspecified: Secondary | ICD-10-CM | POA: Diagnosis not present

## 2020-08-27 DIAGNOSIS — L97511 Non-pressure chronic ulcer of other part of right foot limited to breakdown of skin: Secondary | ICD-10-CM | POA: Diagnosis not present

## 2020-08-27 DIAGNOSIS — Z89421 Acquired absence of other right toe(s): Secondary | ICD-10-CM | POA: Diagnosis not present

## 2020-09-09 ENCOUNTER — Other Ambulatory Visit: Payer: Medicare HMO

## 2020-09-09 ENCOUNTER — Ambulatory Visit: Admission: RE | Admit: 2020-09-09 | Payer: Medicare HMO | Source: Ambulatory Visit

## 2020-09-10 DIAGNOSIS — J449 Chronic obstructive pulmonary disease, unspecified: Secondary | ICD-10-CM | POA: Diagnosis not present

## 2020-09-16 ENCOUNTER — Inpatient Hospital Stay: Admission: RE | Admit: 2020-09-16 | Payer: Medicare HMO | Source: Ambulatory Visit

## 2020-09-16 ENCOUNTER — Other Ambulatory Visit: Payer: Medicare HMO

## 2020-09-25 DIAGNOSIS — D110 Benign neoplasm of parotid gland: Secondary | ICD-10-CM | POA: Diagnosis not present

## 2020-09-25 DIAGNOSIS — R590 Localized enlarged lymph nodes: Secondary | ICD-10-CM | POA: Diagnosis not present

## 2020-10-09 DIAGNOSIS — B351 Tinea unguium: Secondary | ICD-10-CM | POA: Diagnosis not present

## 2020-10-09 DIAGNOSIS — Z89421 Acquired absence of other right toe(s): Secondary | ICD-10-CM | POA: Diagnosis not present

## 2020-10-09 DIAGNOSIS — L97511 Non-pressure chronic ulcer of other part of right foot limited to breakdown of skin: Secondary | ICD-10-CM | POA: Diagnosis not present

## 2020-10-09 DIAGNOSIS — L851 Acquired keratosis [keratoderma] palmaris et plantaris: Secondary | ICD-10-CM | POA: Diagnosis not present

## 2020-10-09 DIAGNOSIS — I739 Peripheral vascular disease, unspecified: Secondary | ICD-10-CM | POA: Diagnosis not present

## 2020-10-10 DIAGNOSIS — J449 Chronic obstructive pulmonary disease, unspecified: Secondary | ICD-10-CM | POA: Diagnosis not present

## 2020-10-17 ENCOUNTER — Ambulatory Visit
Admission: RE | Admit: 2020-10-17 | Discharge: 2020-10-17 | Disposition: A | Discharge status: Home / Self Care | Payer: Medicare HMO | Source: Ambulatory Visit | Attending: Family | Admitting: Family

## 2020-10-17 ENCOUNTER — Other Ambulatory Visit: Payer: Self-pay

## 2020-10-17 DIAGNOSIS — N6320 Unspecified lump in the left breast, unspecified quadrant: Secondary | ICD-10-CM | POA: Diagnosis not present

## 2020-10-17 DIAGNOSIS — R928 Other abnormal and inconclusive findings on diagnostic imaging of breast: Secondary | ICD-10-CM | POA: Diagnosis not present

## 2020-10-21 DIAGNOSIS — M545 Low back pain, unspecified: Secondary | ICD-10-CM | POA: Insufficient documentation

## 2020-10-21 DIAGNOSIS — M5416 Radiculopathy, lumbar region: Secondary | ICD-10-CM | POA: Diagnosis not present

## 2020-10-21 DIAGNOSIS — M5459 Other low back pain: Secondary | ICD-10-CM | POA: Diagnosis not present

## 2020-10-22 DIAGNOSIS — M5441 Lumbago with sciatica, right side: Secondary | ICD-10-CM | POA: Diagnosis not present

## 2020-10-22 DIAGNOSIS — M109 Gout, unspecified: Secondary | ICD-10-CM | POA: Diagnosis not present

## 2020-10-22 DIAGNOSIS — J449 Chronic obstructive pulmonary disease, unspecified: Secondary | ICD-10-CM | POA: Diagnosis not present

## 2020-10-22 DIAGNOSIS — E669 Obesity, unspecified: Secondary | ICD-10-CM | POA: Diagnosis not present

## 2020-10-22 DIAGNOSIS — E785 Hyperlipidemia, unspecified: Secondary | ICD-10-CM | POA: Diagnosis not present

## 2020-10-22 DIAGNOSIS — E782 Mixed hyperlipidemia: Secondary | ICD-10-CM | POA: Diagnosis not present

## 2020-10-22 DIAGNOSIS — I10 Essential (primary) hypertension: Secondary | ICD-10-CM | POA: Diagnosis not present

## 2020-10-22 DIAGNOSIS — D509 Iron deficiency anemia, unspecified: Secondary | ICD-10-CM | POA: Diagnosis not present

## 2020-10-22 DIAGNOSIS — R7303 Prediabetes: Secondary | ICD-10-CM | POA: Diagnosis not present

## 2020-10-29 ENCOUNTER — Other Ambulatory Visit (INDEPENDENT_AMBULATORY_CARE_PROVIDER_SITE_OTHER): Payer: Self-pay | Admitting: Vascular Surgery

## 2020-10-29 DIAGNOSIS — Z95820 Peripheral vascular angioplasty status with implants and grafts: Secondary | ICD-10-CM

## 2020-10-29 DIAGNOSIS — I70213 Atherosclerosis of native arteries of extremities with intermittent claudication, bilateral legs: Secondary | ICD-10-CM

## 2020-10-30 ENCOUNTER — Encounter (INDEPENDENT_AMBULATORY_CARE_PROVIDER_SITE_OTHER): Payer: Medicare HMO

## 2020-10-30 ENCOUNTER — Ambulatory Visit (INDEPENDENT_AMBULATORY_CARE_PROVIDER_SITE_OTHER): Payer: Medicare HMO | Admitting: Vascular Surgery

## 2020-10-31 DIAGNOSIS — I10 Essential (primary) hypertension: Secondary | ICD-10-CM | POA: Diagnosis not present

## 2020-10-31 DIAGNOSIS — M25511 Pain in right shoulder: Secondary | ICD-10-CM | POA: Diagnosis not present

## 2020-10-31 DIAGNOSIS — R0782 Intercostal pain: Secondary | ICD-10-CM | POA: Diagnosis not present

## 2020-10-31 DIAGNOSIS — J449 Chronic obstructive pulmonary disease, unspecified: Secondary | ICD-10-CM | POA: Diagnosis not present

## 2020-10-31 DIAGNOSIS — W010XXA Fall on same level from slipping, tripping and stumbling without subsequent striking against object, initial encounter: Secondary | ICD-10-CM | POA: Diagnosis not present

## 2020-10-31 DIAGNOSIS — E782 Mixed hyperlipidemia: Secondary | ICD-10-CM | POA: Diagnosis not present

## 2020-11-10 DIAGNOSIS — J449 Chronic obstructive pulmonary disease, unspecified: Secondary | ICD-10-CM | POA: Diagnosis not present

## 2020-12-11 DIAGNOSIS — J449 Chronic obstructive pulmonary disease, unspecified: Secondary | ICD-10-CM | POA: Diagnosis not present

## 2021-01-10 DIAGNOSIS — J449 Chronic obstructive pulmonary disease, unspecified: Secondary | ICD-10-CM | POA: Diagnosis not present

## 2021-01-16 DIAGNOSIS — L851 Acquired keratosis [keratoderma] palmaris et plantaris: Secondary | ICD-10-CM | POA: Diagnosis not present

## 2021-01-16 DIAGNOSIS — I739 Peripheral vascular disease, unspecified: Secondary | ICD-10-CM | POA: Diagnosis not present

## 2021-01-16 DIAGNOSIS — B351 Tinea unguium: Secondary | ICD-10-CM | POA: Diagnosis not present

## 2021-01-26 DIAGNOSIS — K219 Gastro-esophageal reflux disease without esophagitis: Secondary | ICD-10-CM | POA: Diagnosis not present

## 2021-01-26 DIAGNOSIS — E782 Mixed hyperlipidemia: Secondary | ICD-10-CM | POA: Diagnosis not present

## 2021-01-26 DIAGNOSIS — J449 Chronic obstructive pulmonary disease, unspecified: Secondary | ICD-10-CM | POA: Diagnosis not present

## 2021-01-26 DIAGNOSIS — I10 Essential (primary) hypertension: Secondary | ICD-10-CM | POA: Diagnosis not present

## 2021-01-26 DIAGNOSIS — E785 Hyperlipidemia, unspecified: Secondary | ICD-10-CM | POA: Diagnosis not present

## 2021-01-26 DIAGNOSIS — G44209 Tension-type headache, unspecified, not intractable: Secondary | ICD-10-CM | POA: Diagnosis not present

## 2021-01-26 DIAGNOSIS — M109 Gout, unspecified: Secondary | ICD-10-CM | POA: Diagnosis not present

## 2021-01-26 DIAGNOSIS — I739 Peripheral vascular disease, unspecified: Secondary | ICD-10-CM | POA: Diagnosis not present

## 2021-01-26 DIAGNOSIS — G629 Polyneuropathy, unspecified: Secondary | ICD-10-CM | POA: Diagnosis not present

## 2021-02-09 ENCOUNTER — Encounter (INDEPENDENT_AMBULATORY_CARE_PROVIDER_SITE_OTHER): Payer: Medicare HMO

## 2021-02-09 ENCOUNTER — Ambulatory Visit (INDEPENDENT_AMBULATORY_CARE_PROVIDER_SITE_OTHER): Payer: Medicare HMO | Admitting: Vascular Surgery

## 2021-02-13 DIAGNOSIS — M86171 Other acute osteomyelitis, right ankle and foot: Secondary | ICD-10-CM | POA: Diagnosis not present

## 2021-02-13 DIAGNOSIS — L97512 Non-pressure chronic ulcer of other part of right foot with fat layer exposed: Secondary | ICD-10-CM | POA: Diagnosis not present

## 2021-02-13 DIAGNOSIS — Z89421 Acquired absence of other right toe(s): Secondary | ICD-10-CM | POA: Diagnosis not present

## 2021-02-13 DIAGNOSIS — I739 Peripheral vascular disease, unspecified: Secondary | ICD-10-CM | POA: Diagnosis not present

## 2021-02-14 ENCOUNTER — Emergency Department: Payer: Medicare HMO

## 2021-02-14 ENCOUNTER — Other Ambulatory Visit: Payer: Self-pay

## 2021-02-14 ENCOUNTER — Inpatient Hospital Stay
Admission: EM | Admit: 2021-02-14 | Discharge: 2021-02-19 | DRG: 253 | Disposition: A | Discharge status: Home Health | Payer: Medicare HMO | Attending: Internal Medicine | Admitting: Internal Medicine

## 2021-02-14 ENCOUNTER — Observation Stay: Payer: Medicare HMO

## 2021-02-14 DIAGNOSIS — L02611 Cutaneous abscess of right foot: Secondary | ICD-10-CM | POA: Diagnosis present

## 2021-02-14 DIAGNOSIS — I70202 Unspecified atherosclerosis of native arteries of extremities, left leg: Secondary | ICD-10-CM | POA: Diagnosis present

## 2021-02-14 DIAGNOSIS — Z89411 Acquired absence of right great toe: Secondary | ICD-10-CM | POA: Diagnosis not present

## 2021-02-14 DIAGNOSIS — J449 Chronic obstructive pulmonary disease, unspecified: Secondary | ICD-10-CM | POA: Diagnosis present

## 2021-02-14 DIAGNOSIS — E782 Mixed hyperlipidemia: Secondary | ICD-10-CM | POA: Diagnosis not present

## 2021-02-14 DIAGNOSIS — F17200 Nicotine dependence, unspecified, uncomplicated: Secondary | ICD-10-CM | POA: Diagnosis present

## 2021-02-14 DIAGNOSIS — R600 Localized edema: Secondary | ICD-10-CM | POA: Diagnosis not present

## 2021-02-14 DIAGNOSIS — L97519 Non-pressure chronic ulcer of other part of right foot with unspecified severity: Secondary | ICD-10-CM | POA: Diagnosis present

## 2021-02-14 DIAGNOSIS — I739 Peripheral vascular disease, unspecified: Secondary | ICD-10-CM | POA: Diagnosis present

## 2021-02-14 DIAGNOSIS — M1990 Unspecified osteoarthritis, unspecified site: Secondary | ICD-10-CM | POA: Diagnosis present

## 2021-02-14 DIAGNOSIS — F17210 Nicotine dependence, cigarettes, uncomplicated: Secondary | ICD-10-CM | POA: Diagnosis present

## 2021-02-14 DIAGNOSIS — Z9049 Acquired absence of other specified parts of digestive tract: Secondary | ICD-10-CM

## 2021-02-14 DIAGNOSIS — E785 Hyperlipidemia, unspecified: Secondary | ICD-10-CM | POA: Diagnosis present

## 2021-02-14 DIAGNOSIS — I70203 Unspecified atherosclerosis of native arteries of extremities, bilateral legs: Secondary | ICD-10-CM | POA: Diagnosis not present

## 2021-02-14 DIAGNOSIS — M25571 Pain in right ankle and joints of right foot: Secondary | ICD-10-CM | POA: Diagnosis not present

## 2021-02-14 DIAGNOSIS — L97512 Non-pressure chronic ulcer of other part of right foot with fat layer exposed: Secondary | ICD-10-CM | POA: Diagnosis not present

## 2021-02-14 DIAGNOSIS — Z96641 Presence of right artificial hip joint: Secondary | ICD-10-CM | POA: Diagnosis present

## 2021-02-14 DIAGNOSIS — Z79899 Other long term (current) drug therapy: Secondary | ICD-10-CM

## 2021-02-14 DIAGNOSIS — M109 Gout, unspecified: Secondary | ICD-10-CM | POA: Diagnosis present

## 2021-02-14 DIAGNOSIS — Z20822 Contact with and (suspected) exposure to covid-19: Secondary | ICD-10-CM | POA: Diagnosis present

## 2021-02-14 DIAGNOSIS — Z7982 Long term (current) use of aspirin: Secondary | ICD-10-CM

## 2021-02-14 DIAGNOSIS — K219 Gastro-esophageal reflux disease without esophagitis: Secondary | ICD-10-CM | POA: Diagnosis present

## 2021-02-14 DIAGNOSIS — I10 Essential (primary) hypertension: Secondary | ICD-10-CM | POA: Diagnosis present

## 2021-02-14 DIAGNOSIS — G8929 Other chronic pain: Secondary | ICD-10-CM | POA: Diagnosis present

## 2021-02-14 DIAGNOSIS — L03031 Cellulitis of right toe: Secondary | ICD-10-CM | POA: Diagnosis not present

## 2021-02-14 DIAGNOSIS — I70261 Atherosclerosis of native arteries of extremities with gangrene, right leg: Secondary | ICD-10-CM | POA: Diagnosis present

## 2021-02-14 DIAGNOSIS — M869 Osteomyelitis, unspecified: Secondary | ICD-10-CM | POA: Diagnosis not present

## 2021-02-14 DIAGNOSIS — Z872 Personal history of diseases of the skin and subcutaneous tissue: Secondary | ICD-10-CM | POA: Diagnosis not present

## 2021-02-14 DIAGNOSIS — M86171 Other acute osteomyelitis, right ankle and foot: Secondary | ICD-10-CM | POA: Diagnosis present

## 2021-02-14 DIAGNOSIS — I96 Gangrene, not elsewhere classified: Secondary | ICD-10-CM | POA: Diagnosis not present

## 2021-02-14 DIAGNOSIS — Z7902 Long term (current) use of antithrombotics/antiplatelets: Secondary | ICD-10-CM | POA: Diagnosis not present

## 2021-02-14 DIAGNOSIS — L97509 Non-pressure chronic ulcer of other part of unspecified foot with unspecified severity: Secondary | ICD-10-CM | POA: Diagnosis not present

## 2021-02-14 DIAGNOSIS — M79674 Pain in right toe(s): Secondary | ICD-10-CM | POA: Diagnosis present

## 2021-02-14 DIAGNOSIS — Z9862 Peripheral vascular angioplasty status: Secondary | ICD-10-CM | POA: Diagnosis not present

## 2021-02-14 DIAGNOSIS — Z87828 Personal history of other (healed) physical injury and trauma: Secondary | ICD-10-CM | POA: Diagnosis not present

## 2021-02-14 LAB — CBC WITH DIFFERENTIAL/PLATELET
Abs Immature Granulocytes: 0.01 10*3/uL (ref 0.00–0.07)
Basophils Absolute: 0 10*3/uL (ref 0.0–0.1)
Basophils Relative: 0 %
Eosinophils Absolute: 0.1 10*3/uL (ref 0.0–0.5)
Eosinophils Relative: 1 %
HCT: 33 % — ABNORMAL LOW (ref 36.0–46.0)
Hemoglobin: 10.7 g/dL — ABNORMAL LOW (ref 12.0–15.0)
Immature Granulocytes: 0 %
Lymphocytes Relative: 38 %
Lymphs Abs: 2 10*3/uL (ref 0.7–4.0)
MCH: 32.4 pg (ref 26.0–34.0)
MCHC: 32.4 g/dL (ref 30.0–36.0)
MCV: 100 fL (ref 80.0–100.0)
Monocytes Absolute: 0.6 10*3/uL (ref 0.1–1.0)
Monocytes Relative: 10 %
Neutro Abs: 2.6 10*3/uL (ref 1.7–7.7)
Neutrophils Relative %: 51 %
Platelets: 340 10*3/uL (ref 150–400)
RBC: 3.3 MIL/uL — ABNORMAL LOW (ref 3.87–5.11)
RDW: 14.2 % (ref 11.5–15.5)
WBC: 5.3 10*3/uL (ref 4.0–10.5)
nRBC: 0 % (ref 0.0–0.2)

## 2021-02-14 LAB — COMPREHENSIVE METABOLIC PANEL
ALT: 9 U/L (ref 0–44)
AST: 16 U/L (ref 15–41)
Albumin: 3.5 g/dL (ref 3.5–5.0)
Alkaline Phosphatase: 56 U/L (ref 38–126)
Anion gap: 5 (ref 5–15)
BUN: 13 mg/dL (ref 8–23)
CO2: 27 mmol/L (ref 22–32)
Calcium: 9.2 mg/dL (ref 8.9–10.3)
Chloride: 107 mmol/L (ref 98–111)
Creatinine, Ser: 0.63 mg/dL (ref 0.44–1.00)
GFR, Estimated: >60 mL/min (ref >60)
Glucose, Bld: 103 mg/dL — ABNORMAL HIGH (ref 70–99)
Potassium: 3.6 mmol/L (ref 3.5–5.1)
Sodium: 139 mmol/L (ref 135–145)
Total Bilirubin: 0.6 mg/dL (ref 0.3–1.2)
Total Protein: 7.7 g/dL (ref 6.5–8.1)

## 2021-02-14 LAB — LACTIC ACID, PLASMA: Lactic Acid, Venous: 0.8 mmol/L (ref 0.5–1.9)

## 2021-02-14 LAB — RESP PANEL BY RT-PCR (FLU A&B, COVID) ARPGX2
Influenza A by PCR: NEGATIVE
Influenza B by PCR: NEGATIVE
SARS Coronavirus 2 by RT PCR: NEGATIVE

## 2021-02-14 LAB — SEDIMENTATION RATE: Sed Rate: 60 mm/hr — ABNORMAL HIGH (ref 0–30)

## 2021-02-14 MED ORDER — ACETAMINOPHEN 650 MG RE SUPP: 650.0000 mg | Freq: Four times a day (QID) | RECTAL | Status: DC | PRN

## 2021-02-14 MED ORDER — ENOXAPARIN SODIUM 60 MG/0.6ML IJ SOSY
0.5000 mg/kg | PREFILLED_SYRINGE | INTRAMUSCULAR | Status: DC
  Administered 2021-02-14 – 2021-02-18 (×4): 50 mg via SUBCUTANEOUS
  Filled 2021-02-14 (×4): qty 0.6

## 2021-02-14 MED ORDER — VANCOMYCIN HCL 2000 MG/400ML IV SOLN
2000.0000 mg | Freq: Once | INTRAVENOUS | Status: AC
  Administered 2021-02-14: 2000 mg via INTRAVENOUS
  Filled 2021-02-14: qty 400

## 2021-02-14 MED ORDER — ACETAMINOPHEN 325 MG PO TABS
650.0000 mg | ORAL_TABLET | Freq: Four times a day (QID) | ORAL | Status: DC | PRN
  Administered 2021-02-15: 650 mg via ORAL
  Filled 2021-02-14: qty 2

## 2021-02-14 MED ORDER — CLOPIDOGREL BISULFATE 75 MG PO TABS
75.0000 mg | ORAL_TABLET | Freq: Every day | ORAL | Status: DC
  Administered 2021-02-15 – 2021-02-19 (×5): 75 mg via ORAL
  Filled 2021-02-14 (×5): qty 1

## 2021-02-14 MED ORDER — VANCOMYCIN HCL 1500 MG/300ML IV SOLN
1500.0000 mg | INTRAVENOUS | Status: DC
  Administered 2021-02-15 – 2021-02-16 (×2): 1500 mg via INTRAVENOUS
  Filled 2021-02-14 (×3): qty 300

## 2021-02-14 MED ORDER — AMLODIPINE BESYLATE 5 MG PO TABS
5.0000 mg | ORAL_TABLET | Freq: Every day | ORAL | Status: DC
  Administered 2021-02-15 – 2021-02-19 (×5): 5 mg via ORAL
  Filled 2021-02-14 (×5): qty 1

## 2021-02-14 MED ORDER — SIMVASTATIN 20 MG PO TABS
10.0000 mg | ORAL_TABLET | Freq: Every evening | ORAL | Status: DC
  Administered 2021-02-15: 19:00:00 10 mg via ORAL
  Filled 2021-02-14: qty 1

## 2021-02-14 MED ORDER — NICOTINE 14 MG/24HR TD PT24
14.0000 mg | MEDICATED_PATCH | Freq: Every day | TRANSDERMAL | Status: DC
  Administered 2021-02-14 – 2021-02-19 (×7): 14 mg via TRANSDERMAL
  Filled 2021-02-14 (×7): qty 1

## 2021-02-14 MED ORDER — GADOBUTROL 1 MMOL/ML IV SOLN
10.0000 mL | Freq: Once | INTRAVENOUS | Status: AC | PRN
  Administered 2021-02-14: 10 mL via INTRAVENOUS
  Filled 2021-02-14: qty 10

## 2021-02-14 MED ORDER — MELATONIN 5 MG PO TABS
5.0000 mg | ORAL_TABLET | Freq: Every evening | ORAL | Status: DC | PRN
  Administered 2021-02-14 – 2021-02-18 (×5): 5 mg via ORAL
  Filled 2021-02-14 (×6): qty 1

## 2021-02-14 MED ORDER — ONDANSETRON HCL 4 MG/2ML IJ SOLN: 4.0000 mg | Freq: Four times a day (QID) | INTRAMUSCULAR | Status: DC | PRN

## 2021-02-14 MED ORDER — ASPIRIN EC 81 MG PO TBEC
81.0000 mg | DELAYED_RELEASE_TABLET | Freq: Every day | ORAL | Status: DC
  Administered 2021-02-15 – 2021-02-19 (×5): 81 mg via ORAL
  Filled 2021-02-14 (×5): qty 1

## 2021-02-14 MED ORDER — ONDANSETRON HCL 4 MG PO TABS: 4.0000 mg | ORAL_TABLET | Freq: Four times a day (QID) | ORAL | Status: DC | PRN

## 2021-02-14 MED ORDER — PIPERACILLIN-TAZOBACTAM 3.375 G IVPB
3.3750 g | Freq: Three times a day (TID) | INTRAVENOUS | Status: DC
  Administered 2021-02-14 – 2021-02-17 (×8): 3.375 g via INTRAVENOUS
  Filled 2021-02-14 (×8): qty 50

## 2021-02-14 MED ORDER — PIPERACILLIN-TAZOBACTAM 3.375 G IVPB 30 MIN
3.3750 g | Freq: Once | INTRAVENOUS | Status: AC
  Administered 2021-02-14: 3.375 g via INTRAVENOUS
  Filled 2021-02-14: qty 50

## 2021-02-14 MED ORDER — NICOTINE POLACRILEX 2 MG MT GUM
2.0000 mg | CHEWING_GUM | OROMUCOSAL | Status: DC | PRN
  Filled 2021-02-14: qty 1

## 2021-02-14 NOTE — ED Notes (Signed)
Lab called for blood draw.

## 2021-02-14 NOTE — Assessment & Plan Note (Signed)
Stable

## 2021-02-14 NOTE — H&P (Signed)
History and Physical    Jackie Macias PJA:[id number] DOB: 01/06/1956 DOA: 02/14/2021  PCP: Lavera Guise, MD   Patient coming from: Home  I have personally briefly reviewed patient's old medical records in [place]  CC: sent to ER by podiatry HPI: 65 year old African-American female with history of peripheral vascular disease status right 1st toe amputation several years ago, hypertension, reflux, tobacco abuse disorder presents to the ER today with after being sent to the ER from the podiatry office.  Patient was seen in the office yesterday by podiatry.  She was started on p.o. antibiotics.  This was with Augmentin. Patient relates a 2-week history of worsening pain in her right toe initially.  She states that the toe was swollen and had a thick crust on it.  She states that she was treating it at home with alcohol, peroxide, Betadine baths.  This improved the patient's swelling and pain.  Patient has been using her right foot without difficulty.  She states that she drives and completes her activities of daily living without impediment.  Yesterday she was started on p.o. antibiotics.  She is only taken 1 dose.  She states that the toe was already improving this bite not being on antibiotics.  X-ray of the right foot was obtained in the office.  X-ray suggested possible osteomyelitis.  No comparison x-rays were performed.  On arrival to the ER, patient was afebrile.  Temperature 98.8.  Heart rate 72.  Blood pressure 164/65.  98% on room air.  Laboratory evaluation.  White count 5.3, hemoglobin 10.7, platelets 340.  Sodium 139, potassium 3.6, chloride 107, bicarb 27, BUN 13 ,creatinine 0.63.  Lactic acid was normal at 0.8.  EDP is discussed the case with podiatry with Dr. Luana Shu and also along with vascular surgery.  Podiatry wanted the patient admitted.  [facility] hospitalist consulted for admission.   ED Course: pt afebrile. Labs reassuring. EDP discussed case with podiatry.  Podiatry adamantly wants pt to be admitted. Vascular surgery consulted by EDP.  Review of Systems:  Review of Systems  Constitutional:  Negative for chills, fever, malaise/fatigue and weight loss.  HENT: Negative.    Eyes: Negative.   Respiratory: Negative.    Cardiovascular: Negative.   Gastrointestinal: Negative.  Negative for abdominal pain, nausea and vomiting.  Genitourinary: Negative.   Musculoskeletal:        Initially her toe was swollen 2 weeks ago but with local wound care with alcohol, Betadine, hydroperoxide, swelling has improved.  Pain is improved.  She had local wound debridement in the office yesterday by podiatry.  Pus cannot be expressed from the right toe today.  Neurological: Negative.   Endo/Heme/Allergies: Negative.        Patient denies history of diabetes  Psychiatric/Behavioral: Negative.    All other systems reviewed and are negative.  Past Medical History:  Diagnosis Date   Arthritis    Bronchitis    COPD (chronic obstructive pulmonary disease) (HCC)    GERD (gastroesophageal reflux disease)    Gout    Hypertension     Past Surgical History:  Procedure Laterality Date   AMPUTATION Right 07/29/2017   Procedure: AMPUTATION DIGIT/ 34193;  Surgeon: Sharlotte Alamo, DPM;  Location: [facility];  Service: Podiatry;  Laterality: Right;   CHOLECYSTECTOMY     facial biopsy Right    JOINT REPLACEMENT Right 12/2016   THR   LOWER EXTREMITY ANGIOGRAPHY Right 09/27/2017   Procedure: LOWER EXTREMITY ANGIOGRAPHY;  Surgeon: Katha Cabal, MD;  Location: [place] [facility];  Service: Cardiovascular;  Laterality: Right;   LOWER EXTREMITY ANGIOGRAPHY Right 06/17/2020   Procedure: LOWER EXTREMITY ANGIOGRAPHY;  Surgeon: Katha Cabal, MD;  Location: [place] [facility];  Service: Cardiovascular;  Laterality: Right;   TONSILLECTOMY     TOTAL HIP ARTHROPLASTY Right 01/17/2017   Procedure: TOTAL HIP ARTHROPLASTY ANTERIOR APPROACH;  Surgeon: Lovell Sheehan, MD;   Location: [facility];  Service: Orthopedics;  Laterality: Right;     reports that she has been smoking cigarettes. She has been smoking an average of .5 packs per day. She has quit using smokeless tobacco. She reports current alcohol use of about 3.0 standard drinks per week. She reports that she does not use drugs.  No Known Allergies  Family History  Problem Relation Age of Onset   Breast cancer Sister 26   Breast cancer Other     Prior to Admission medications   Medication Sig Start Date End Date Taking? Authorizing Provider  albuterol (VENTOLIN HFA) 108 (90 Base) MCG/ACT inhaler Inhale 2 puffs into the lungs every 6 (six) hours as needed for wheezing or shortness of breath. 09/26/18   [provider]  amLODipine (NORVASC) 5 MG tablet Take 5 mg by mouth daily.    [provider]  aspirin EC 81 MG tablet Take 1 tablet (81 mg total) by mouth daily. 06/17/20   Schnier, Dolores Lory, MD  cetirizine (ZYRTEC) 5 MG tablet Take 5 mg by mouth daily.    [provider]  clopidogrel (PLAVIX) 75 MG tablet Take 1 tablet (75 mg total) by mouth daily. 06/18/20   Schnier, Dolores Lory, MD  HYDROcodone-acetaminophen (NORCO) 5-325 MG tablet Take 1-2 tablets by mouth every 6 (six) hours as needed. Patient not taking: Reported on 07/28/2020 06/17/20   Schnier, Dolores Lory, MD  naproxen sodium (ALEVE) 220 MG tablet Take 220 mg by mouth daily as needed (Pain).    [provider]  simvastatin (ZOCOR) 10 MG tablet Take 1 tablet (10 mg total) by mouth every evening. 06/17/20 06/17/21  Schnier, Dolores Lory, MD    Physical Exam: Vitals:   02/14/21 1324 02/14/21 1326 02/14/21 1447  BP:  (!) 164/65 (!) 178/67  Pulse:  72 72  Resp:  18 20  Temp:  98.8 F (37.1 C)   TempSrc:  Oral   SpO2:  98% 98%  Weight: 101.6 kg    Height: 5\' 6"  (1.676 m)      Physical Exam Vitals and nursing note reviewed.  Constitutional:      General: She is not in acute distress.    Appearance: Normal  appearance. She is normal weight. She is not ill-appearing, toxic-appearing or diaphoretic.  HENT:     Head: Normocephalic and atraumatic.     Nose: Nose normal. No rhinorrhea.  Eyes:     General: No scleral icterus.       Right eye: No discharge.        Left eye: No discharge.  Cardiovascular:     Rate and Rhythm: Normal rate and regular rhythm.  Pulmonary:     Effort: Pulmonary effort is normal. No respiratory distress.     Breath sounds: Normal breath sounds. No wheezing or rales.  Abdominal:     General: Bowel sounds are normal. There is no distension.     Tenderness: There is no abdominal tenderness. There is no guarding or rebound.  Musculoskeletal:     Comments: No palpable pulses in bilateral feet. Feet are  warm to touch.  See pictures of right foot/toe. No evidence of dry or wet gangrene of right foot. No odor of gangrene.  Skin:    Capillary Refill: Capillary refill takes less than 2 seconds.  Neurological:     General: No focal deficit present.     Mental Status: She is alert and oriented to person, place, and time.         Labs on Admission: I have personally reviewed following labs and imaging studies  CBC: Recent Labs  Lab 02/14/21 1407  WBC 5.3  NEUTROABS 2.6  HGB 10.7*  HCT 33.0*  MCV 100.0  PLT 956   Basic Metabolic Panel: Recent Labs  Lab 02/14/21 1407  NA 139  K 3.6  CL 107  CO2 27  GLUCOSE 103*  BUN 13  CREATININE 0.63  CALCIUM 9.2   GFR: Estimated Creatinine Clearance: 83.2 mL/min (by C-G formula based on SCr of 0.63 mg/dL). Liver Function Tests: Recent Labs  Lab 02/14/21 1407  AST 16  ALT 9  ALKPHOS 56  BILITOT 0.6  PROT 7.7  ALBUMIN 3.5   No results for input(s): LIPASE, AMYLASE in the last 168 hours. No results for input(s): AMMONIA in the last 168 hours. Coagulation Profile: No results for input(s): INR, PROTIME in the last 168 hours. Cardiac Enzymes: No results for input(s): CKTOTAL, CKMB, CKMBINDEX, TROPONINI in the  last 168 hours. BNP (last 3 results) No results for input(s): PROBNP in the last 8760 hours. HbA1C: No results for input(s): HGBA1C in the last 72 hours. CBG: No results for input(s): GLUCAP in the last 168 hours. Lipid Profile: No results for input(s): CHOL, HDL, LDLCALC, TRIG, CHOLHDL, LDLDIRECT in the last 72 hours. Thyroid Function Tests: No results for input(s): TSH, T4TOTAL, FREET4, T3FREE, THYROIDAB in the last 72 hours. Anemia Panel: No results for input(s): VITAMINB12, FOLATE, FERRITIN, TIBC, IRON, RETICCTPCT in the last 72 hours. Urine analysis:    Component Value Date/Time   COLORURINE YELLOW (A) 01/05/2017 1328   APPEARANCEUR CLEAR (A) 01/05/2017 1328   LABSPEC 1.021 01/05/2017 1328   PHURINE 6.0 01/05/2017 1328   GLUCOSEU NEGATIVE 01/05/2017 1328   HGBUR SMALL (A) 01/05/2017 1328   BILIRUBINUR NEGATIVE 01/05/2017 1328   KETONESUR NEGATIVE 01/05/2017 1328   PROTEINUR NEGATIVE 01/05/2017 1328   NITRITE NEGATIVE 01/05/2017 1328   LEUKOCYTESUR NEGATIVE 01/05/2017 1328    Radiological Exams on Admission: I have personally reviewed images DG Foot Complete Right  Result Date: 02/14/2021 CLINICAL DATA:  Pain. Osteomyelitis. Drainage. Wound infection a right great toe. History of amputation of great toe. EXAM: RIGHT FOOT COMPLETE - 3+ VIEW COMPARISON:  None. FINDINGS: The patient is status post partial amputation of the great toe. There is lucency in the distal aspect of the remaining proximal phalanx. IMPRESSION: 1. The patient is status post partial amputation of the great toe. There is lucency in the distal aspect of the remaining proximal phalanx. While it is possible the deformity of the remaining proximal phalanx could be due to previous amputation, the irregularity and lucency is most suggestive of osteomyelitis. If the clinical picture is ambiguous, MRI could better evaluate. Electronically Signed   By: Dorise Bullion III M.D.   On: 02/14/2021 15:08    EKG: I have  personally reviewed EKG: no EKG performed  Assessment/Plan Principal Problem:   Toe osteomyelitis, right (HCC) Active Problems:   PAD (peripheral artery disease) (HCC)   Essential hypertension   GERD (gastroesophageal reflux disease)   Tobacco use disorder  Hyperlipidemia    Toe osteomyelitis, right ([place]) Assigned observation status.  We will obtain MRI of the right toes with and without contrast.  We will continue IV antibiotic therapy with IV vancomycin and IV Zosyn.  There is no evidence of either wet or dry gangrene at this point.  Purulence cannot be expressed from the wound in the right toe.  We will send inflammatory markers including CRP and sed rate.  Patient states that she would be amicable to long-term antibiotics in order to avoid surgery if possible.  At this is feasible, consideration of infectious diease consult for long-term IV antibiotic management.  Podiatry and vascular surgery should be consulted in the morning.  PAD (peripheral artery disease) (HCC) Continue with aspirin.  Essential hypertension Continue with Norvasc  GERD (gastroesophageal reflux disease) Stable  Tobacco use disorder Patient continues to smoke.  Nicotine patch will be ordered.  She has no intention of quitting.  Hyperlipidemia Continue simvastatin.  DVT prophylaxis: Lovenox Code Status: Full Code Family Communication: discussed with pt at bedside  Disposition Plan: return to home. EDD unknown. Will depend on what podiatry does in management.  Consults called: EDP has consulted podiatry and vascular surgery.  Admission status: Observation, Med-Surg   Kristopher Oppenheim, DO [facility] 02/14/2021, 5:33 PM

## 2021-02-14 NOTE — Assessment & Plan Note (Signed)
Continue with aspirin. 

## 2021-02-14 NOTE — Subjective & Objective (Signed)
CC: sent to ER by podiatry HPI: 65 year old African-American female with history of peripheral vascular disease status right 1st toe amputation several years ago, hypertension, reflux, tobacco abuse disorder presents to the ER today with after being sent to the ER from the podiatry office.  Patient was seen in the office yesterday by podiatry.  She was started on p.o. antibiotics.  This was with Augmentin. Patient relates a 2-week history of worsening pain in her right toe initially.  She states that the toe was swollen and had a thick crust on it.  She states that she was treating it at home with alcohol, peroxide, Betadine baths.  This improved the patient's swelling and pain.  Patient has been using her right foot without difficulty.  She states that she drives and completes her activities of daily living without impediment.  Yesterday she was started on p.o. antibiotics.  She is only taken 1 dose.  She states that the toe was already improving this bite not being on antibiotics.  X-ray of the right foot was obtained in the office.  X-ray suggested possible osteomyelitis.  No comparison x-rays were performed.  On arrival to the ER, patient was afebrile.  Temperature 98.8.  Heart rate 72.  Blood pressure 164/65.  98% on room air.  Laboratory evaluation.  White count 5.3, hemoglobin 10.7, platelets 340.  Sodium 139, potassium 3.6, chloride 107, bicarb 27, BUN 13 ,creatinine 0.63.  Lactic acid was normal at 0.8.  EDP is discussed the case with podiatry with Dr. Luana Shu and also along with vascular surgery.  Podiatry wanted the patient admitted.  [facility] hospitalist consulted for admission.

## 2021-02-14 NOTE — ED Notes (Signed)
Lab called to obtain BC's.

## 2021-02-14 NOTE — ED Provider Notes (Signed)
[facility]  ____________________________________________   Event Date/Time   First MD Initiated Contact with Patient 02/14/21 1612     (approximate)  I have reviewed the triage vital signs and the nursing notes.   HISTORY  Chief Complaint Wound Infection    HPI KHRISTINE VERNO is a 65 y.o. female with past medical history of hypertension, GERD, COPD, peripheral vascular disease status post angioplasty, s/p amputation of distal right hallux who presents with a wound infection.  Patient follows with Dr.Cline with podiatry.  Was seen yesterday in the office and had an x-ray that was concerning for osteomyelitis.  Patient was started on Augmentin and told to come to the emergency department.  The patient notes that she has had not had any fevers or chills.  She does have some chronic pain with ambulation in the bilateral lower extremities but no new pain or no rest pain.  She did start taking Augmentin yesterday.  Otherwise she denies any new symptoms.         Past Medical History:  Diagnosis Date   Arthritis    Bronchitis    COPD (chronic obstructive pulmonary disease) (HCC)    GERD (gastroesophageal reflux disease)    Gout    Hypertension     Patient Active Problem List   Diagnosis Date Noted   Atherosclerosis of native arteries of extremity with intermittent claudication ([place]) 07/28/2020   Hyperlipidemia 07/28/2020   Enthesopathy of hip region 06/06/2020   Parotid mass 10/05/2019   Carotid stenosis 10/23/2018   GERD (gastroesophageal reflux disease) 10/23/2018   Intracranial vascular stenosis 10/23/2018   Essential hypertension 09/08/2017   PAD (peripheral artery disease) ([place]) 09/08/2017   Status post THR (total hip replacement) 01/17/2017   Gastro-esophageal reflux disease with esophagitis 73/41/9379   Helicobacter pylori infection 08/17/2010   Iron deficiency anemia 08/17/2010   Osteoarthritis 08/17/2010   Tobacco use disorder 08/17/2010    History of colonic polyps 07/02/2010   Internal hemorrhoids 07/02/2010    Past Surgical History:  Procedure Laterality Date   AMPUTATION Right 07/29/2017   Procedure: AMPUTATION DIGIT/ 02409;  Surgeon: Sharlotte Alamo, DPM;  Location: [facility] ORS;  Service: Podiatry;  Laterality: Right;   CHOLECYSTECTOMY     facial biopsy Right    JOINT REPLACEMENT Right 12/2016   THR   LOWER EXTREMITY ANGIOGRAPHY Right 09/27/2017   Procedure: LOWER EXTREMITY ANGIOGRAPHY;  Surgeon: Katha Cabal, MD;  Location: [facility];  Service: Cardiovascular;  Laterality: Right;   LOWER EXTREMITY ANGIOGRAPHY Right 06/17/2020   Procedure: LOWER EXTREMITY ANGIOGRAPHY;  Surgeon: Katha Cabal, MD;  Location: [facility];  Service: Cardiovascular;  Laterality: Right;   TONSILLECTOMY     TOTAL HIP ARTHROPLASTY Right 01/17/2017   Procedure: TOTAL HIP ARTHROPLASTY ANTERIOR APPROACH;  Surgeon: Lovell Sheehan, MD;  Location: [facility];  Service: Orthopedics;  Laterality: Right;    Prior to Admission medications   Medication Sig Start Date End Date Taking? Authorizing Provider  albuterol (VENTOLIN HFA) 108 (90 Base) MCG/ACT inhaler Inhale 2 puffs into the lungs every 6 (six) hours as needed for wheezing or shortness of breath. 09/26/18   [provider]  amLODipine (NORVASC) 5 MG tablet Take 5 mg by mouth daily.    [provider]  aspirin EC 81 MG tablet Take 1 tablet (81 mg total) by mouth daily. 06/17/20   Schnier, Dolores Lory, MD  cetirizine (ZYRTEC) 5 MG tablet Take 5 mg by mouth daily.  [provider]  clopidogrel (PLAVIX) 75 MG tablet Take 1 tablet (75 mg total) by mouth daily. 06/18/20   Schnier, Dolores Lory, MD  HYDROcodone-acetaminophen (NORCO) 5-325 MG tablet Take 1-2 tablets by mouth every 6 (six) hours as needed. Patient not taking: Reported on 07/28/2020 06/17/20   Schnier, Dolores Lory, MD  naproxen sodium (ALEVE) 220 MG tablet Take 220 mg by mouth daily as needed (Pain).     [provider]  simvastatin (ZOCOR) 10 MG tablet Take 1 tablet (10 mg total) by mouth every evening. 06/17/20 06/17/21  Schnier, Dolores Lory, MD    Allergies Patient has no known allergies.  Family History  Problem Relation Age of Onset   Breast cancer Sister 56   Breast cancer Other     Social History Social History   Tobacco Use   Smoking status: Every Day    Packs/day: 0.50    Types: Cigarettes   Smokeless tobacco: Former  Scientific laboratory technician Use: Never used  Substance Use Topics   Alcohol use: Yes    Alcohol/week: 3.0 standard drinks    Types: 3 Cans of beer per week    Comment: occasional   Drug use: No    Review of Systems   Review of Systems  Constitutional:  Negative for chills and fever.  Skin:  Positive for wound.  All other systems reviewed and are negative.  Physical Exam Updated Vital Signs BP (!) 178/67 (BP Location: Left Arm)   Pulse 72   Temp 98.8 F (37.1 C) (Oral)   Resp 20   Ht 5\' 6"  (1.676 m)   Wt 101.6 kg   SpO2 98%   BMI 36.15 kg/m   Physical Exam Vitals and nursing note reviewed.  Constitutional:      General: She is not in acute distress.    Appearance: Normal appearance.  HENT:     Head: Normocephalic and atraumatic.  Eyes:     General: No scleral icterus.    Conjunctiva/sclera: Conjunctivae normal.  Pulmonary:     Effort: Pulmonary effort is normal. No respiratory distress.     Breath sounds: No stridor.  Musculoskeletal:        General: No deformity or signs of injury.     Cervical back: Normal range of motion.     Comments: Bilateral lower extremities are warm, there is no palpable DP or PT pulses in either extremity Biphasic dopplerable PT pulse on the right, no dopplerable DP on the right Probable DP on the left  The right first toe photo below No significant erythema, tenderness, crepitus or drainage noted  Skin:    General: Skin is dry.     Coloration: Skin is not jaundiced or pale.  Neurological:      General: No focal deficit present.     Mental Status: She is alert and oriented to person, place, and time. Mental status is at baseline.  Psychiatric:        Mood and Affect: Mood normal.        Behavior: Behavior normal.       LABS (all labs ordered are listed, but only abnormal results are displayed)  Labs Reviewed  COMPREHENSIVE METABOLIC PANEL - Abnormal; Notable for the following components:      Result Value   Glucose, Bld 103 (*)    All other components within normal limits  CBC WITH DIFFERENTIAL/PLATELET - Abnormal; Notable for the following components:   RBC 3.30 (*)    Hemoglobin 10.7 (*)  HCT 33.0 (*)    All other components within normal limits  LACTIC ACID, PLASMA  LACTIC ACID, PLASMA   ____________________________________________  EKG  N/a ____________________________________________  RADIOLOGY I, Madelin Headings, personally viewed and evaluated these images (plain radiographs) as part of my medical decision making, as well as reviewing the written report by the radiologist.  ED MD interpretation: I reviewed the image of the right foot which has increased lucency over the right first phalanx concerning for osteomyelitis    ____________________________________________   PROCEDURES  Procedure(s) performed (including Critical Care):  Procedures   ____________________________________________   INITIAL IMPRESSION / ASSESSMENT AND PLAN / ED COURSE     Patient is a 65 year old female with peripheral vascular disease who presents with ongoing wound to the right first toe which has had a distal amputation in the past now concerning for osteomyelitis.  She was referred to the ER by Dr. Cleda Mccreedy.  She has no systemic symptoms.  She does have pain in the bilateral lower extremities with ambulation but no rest pain.  On exam there is some darkening of the skin but no obvious tenderness, erythema or drainage.  There are 2 small ulcerations that are open.   Discussed with Dr. Luana Shu who is also aware of the patient.  He notes that she will need to have the amputation revised due to likely osteomyelitis in that foot.  Also recommended vascular valuation.  Patient does not have a dopplerable DP pulse in that foot does have a dopplerable PT.  Has extensive peripheral vascular disease.  Discussed Vascular surgeon on call  Dr. Feliberto Gottron who will see the patient as well.      ____________________________________________   FINAL CLINICAL IMPRESSION(S) / ED DIAGNOSES  Final diagnoses:  Osteomyelitis of right foot, unspecified type ([place])  PVD (peripheral vascular disease) [facility])     ED Discharge Orders     None        Note:  This document was prepared using Dragon voice recognition software and may include unintentional dictation errors.    Rada Hay, MD 02/14/21 [phone]

## 2021-02-14 NOTE — ED Triage Notes (Signed)
Pt to ED for wound infection to right foot big toe, referred by Dr Jens Som.

## 2021-02-14 NOTE — ED Provider Notes (Signed)
Emergency Medicine Provider Triage Evaluation Note  Jackie Macias , a 65 y.o. female  was evaluated in triage.  Pt complains of right foot pain.  Patient was instructed by Dr. Caryl Comes to come to the ED as he will need to remove the rest of her foot.  Old emergency with need for Dr. Delana Meyer to do a arteriogram to check blood supply.  She has had drainage from the right great toe..  Review of Systems  Positive: Drainage from the right great toe, foot pain Negative: Fever, chills  Physical Exam  BP (!) 164/65   Pulse 72   Temp 98.8 F (37.1 C) (Oral)   Resp 18   Ht 5\' 6"  (1.676 m)   Wt 101.6 kg   SpO2 98%   BMI 36.15 kg/m  Gen:   Awake, no distress   Resp:  Normal effort  MSK:   Right foot is very dark along the distal portion, drainage noted from the great toe, area is warm to the touch Other:    Medical Decision Making  Medically screening exam initiated at 1:32 PM.  Appropriate orders placed.  Jackie Macias was informed that the remainder of the evaluation will be completed by another provider, this initial triage assessment does not replace that evaluation, and the importance of remaining in the ED until their evaluation is complete.     Versie Starks, PA-C 02/14/21 1333    Delman Kitten, MD 02/14/21 Pauline Aus

## 2021-02-14 NOTE — ED Notes (Signed)
Pt taken to MRI at this time

## 2021-02-14 NOTE — Assessment & Plan Note (Signed)
Continue simvastatin. 

## 2021-02-14 NOTE — Assessment & Plan Note (Signed)
Continue with Norvasc

## 2021-02-14 NOTE — ED Notes (Signed)
Pt presents to ED with c/o of R foot pain. Pt states she was sent by Dr. Leda Min office. Pt states she has a known foot infection and has been treating it with oral ABX. R pedal pulse had to be found with a doppler and was found to be faint when found with doppler. Pt is A&OX4. Pt denies fevers or chills.

## 2021-02-14 NOTE — ACP (Advance Care Planning) (Signed)
Advance Care Planning  Reason for Advance Care Planning Conversation: Acute hospitalization Principal Problem:   Toe osteomyelitis, right (HCC) Active Problems:   PAD (peripheral artery disease) ([place])   Essential hypertension   GERD (gastroesophageal reflux disease)   Tobacco use disorder   Hyperlipidemia    I discussed with patient about advance care planning. Specifically, we discussed whether patient would desire cardiopulmonary resuscitation (CPR) in the event of acute cardiopulmonary arrest. We also discussed whether endotracheal intubation and temporary ventilator life support would be desired in the event of acute cardio- or pulmonary decompensation.   Code status order of Full Code has been entered in accord with the patient's wishes. with intubation  Living will: no  Health Care Agent / Davonna Belling              Name: candace Carby: Relationship to Patient: dtr   Is agent appointed in legal document no  Time spent today in ACP discussion was  5 mins  Kristopher Oppenheim, DO

## 2021-02-14 NOTE — Consult Note (Signed)
Pharmacy Antibiotic Note  RONNISHA FELBER is a 65 y.o. female admitted on 02/14/2021 with osteomyelitis. Pharmacy has been consulted for Zosyn and vancomycin dosing.  Plan: Vancomycin 2000 mg IV loading dose, followed by 1500 mg IV q24h Goal AUC 400-550  Est AUC: 507.8 Est Cmax: 39.2 Est Cmin: 10.6  Calculated with SCr 0.8, Vd 0.5  Zosyn 3.375 gm IV q8h (4 hour infusion)  Height: 5\' 6"  (167.6 cm) Weight: 101.6 kg (224 lb) IBW/kg (Calculated) : 59.3  Temp (24hrs), Avg:98.8 F (37.1 C), Min:98.8 F (37.1 C), Max:98.8 F (37.1 C)  Recent Labs  Lab 02/14/21 1407  WBC 5.3  CREATININE 0.63  LATICACIDVEN 0.8    Estimated Creatinine Clearance: 83.2 mL/min (by C-G formula based on SCr of 0.63 mg/dL).    No Known Allergies  Antimicrobials this admission: 11/19 Zosyn >>  11/19 vancomycin >>   Dose adjustments this admission: N/A  Microbiology results: 11/19 BCx: pending   Thank you for allowing pharmacy to be a part of this patient's care.  Darnelle Bos, PharmD 02/14/2021 5:57 PM

## 2021-02-14 NOTE — Assessment & Plan Note (Signed)
Patient continues to smoke.  Nicotine patch will be ordered.  She has no intention of quitting.

## 2021-02-14 NOTE — ED Notes (Signed)
Dietary called and requested hot dinner tray and to also bring try to RM 31 since pt will be moving to admission area.

## 2021-02-14 NOTE — Assessment & Plan Note (Signed)
Assigned observation status.  We will obtain MRI of the right toes with and without contrast.  We will continue IV antibiotic therapy with IV vancomycin and IV Zosyn.  There is no evidence of either wet or dry gangrene at this point.  Purulence cannot be expressed from the wound in the right toe.  We will send inflammatory markers including CRP and sed rate.  Patient states that she would be amicable to long-term antibiotics in order to avoid surgery if possible.  At this is feasible, consideration of infectious diease consult for long-term IV antibiotic management.  Podiatry and vascular surgery should be consulted in the morning.

## 2021-02-15 ENCOUNTER — Encounter: Payer: Self-pay | Admitting: Internal Medicine

## 2021-02-15 DIAGNOSIS — K219 Gastro-esophageal reflux disease without esophagitis: Secondary | ICD-10-CM | POA: Diagnosis present

## 2021-02-15 DIAGNOSIS — M109 Gout, unspecified: Secondary | ICD-10-CM | POA: Diagnosis present

## 2021-02-15 DIAGNOSIS — F17210 Nicotine dependence, cigarettes, uncomplicated: Secondary | ICD-10-CM | POA: Diagnosis present

## 2021-02-15 DIAGNOSIS — M869 Osteomyelitis, unspecified: Secondary | ICD-10-CM | POA: Diagnosis not present

## 2021-02-15 DIAGNOSIS — Z96641 Presence of right artificial hip joint: Secondary | ICD-10-CM | POA: Diagnosis present

## 2021-02-15 DIAGNOSIS — Z7982 Long term (current) use of aspirin: Secondary | ICD-10-CM | POA: Diagnosis not present

## 2021-02-15 DIAGNOSIS — L02611 Cutaneous abscess of right foot: Secondary | ICD-10-CM | POA: Diagnosis present

## 2021-02-15 DIAGNOSIS — I70202 Unspecified atherosclerosis of native arteries of extremities, left leg: Secondary | ICD-10-CM | POA: Diagnosis present

## 2021-02-15 DIAGNOSIS — Z9049 Acquired absence of other specified parts of digestive tract: Secondary | ICD-10-CM | POA: Diagnosis not present

## 2021-02-15 DIAGNOSIS — I70203 Unspecified atherosclerosis of native arteries of extremities, bilateral legs: Secondary | ICD-10-CM | POA: Diagnosis not present

## 2021-02-15 DIAGNOSIS — Z20822 Contact with and (suspected) exposure to covid-19: Secondary | ICD-10-CM | POA: Diagnosis present

## 2021-02-15 DIAGNOSIS — Z79899 Other long term (current) drug therapy: Secondary | ICD-10-CM | POA: Diagnosis not present

## 2021-02-15 DIAGNOSIS — M1990 Unspecified osteoarthritis, unspecified site: Secondary | ICD-10-CM | POA: Diagnosis present

## 2021-02-15 DIAGNOSIS — E785 Hyperlipidemia, unspecified: Secondary | ICD-10-CM | POA: Diagnosis present

## 2021-02-15 DIAGNOSIS — I10 Essential (primary) hypertension: Secondary | ICD-10-CM | POA: Diagnosis present

## 2021-02-15 DIAGNOSIS — I70261 Atherosclerosis of native arteries of extremities with gangrene, right leg: Secondary | ICD-10-CM | POA: Diagnosis present

## 2021-02-15 DIAGNOSIS — I739 Peripheral vascular disease, unspecified: Secondary | ICD-10-CM | POA: Diagnosis not present

## 2021-02-15 DIAGNOSIS — J449 Chronic obstructive pulmonary disease, unspecified: Secondary | ICD-10-CM | POA: Diagnosis present

## 2021-02-15 DIAGNOSIS — Z7902 Long term (current) use of antithrombotics/antiplatelets: Secondary | ICD-10-CM | POA: Diagnosis not present

## 2021-02-15 DIAGNOSIS — M86171 Other acute osteomyelitis, right ankle and foot: Secondary | ICD-10-CM | POA: Diagnosis present

## 2021-02-15 DIAGNOSIS — G8929 Other chronic pain: Secondary | ICD-10-CM | POA: Diagnosis present

## 2021-02-15 DIAGNOSIS — L97519 Non-pressure chronic ulcer of other part of right foot with unspecified severity: Secondary | ICD-10-CM | POA: Diagnosis present

## 2021-02-15 DIAGNOSIS — M79674 Pain in right toe(s): Secondary | ICD-10-CM | POA: Diagnosis present

## 2021-02-15 DIAGNOSIS — Z9862 Peripheral vascular angioplasty status: Secondary | ICD-10-CM | POA: Diagnosis not present

## 2021-02-15 LAB — COMPREHENSIVE METABOLIC PANEL
ALT: 8 U/L (ref 0–44)
AST: 15 U/L (ref 15–41)
Albumin: 3 g/dL — ABNORMAL LOW (ref 3.5–5.0)
Alkaline Phosphatase: 49 U/L (ref 38–126)
Anion gap: 5 (ref 5–15)
BUN: 13 mg/dL (ref 8–23)
CO2: 25 mmol/L (ref 22–32)
Calcium: 8.8 mg/dL — ABNORMAL LOW (ref 8.9–10.3)
Chloride: 106 mmol/L (ref 98–111)
Creatinine, Ser: 0.65 mg/dL (ref 0.44–1.00)
GFR, Estimated: >60 mL/min (ref >60)
Glucose, Bld: 121 mg/dL — ABNORMAL HIGH (ref 70–99)
Potassium: 3.7 mmol/L (ref 3.5–5.1)
Sodium: 136 mmol/L (ref 135–145)
Total Bilirubin: 0.5 mg/dL (ref 0.3–1.2)
Total Protein: 6.6 g/dL (ref 6.5–8.1)

## 2021-02-15 LAB — HIV ANTIBODY (ROUTINE TESTING W REFLEX): HIV Screen 4th Generation wRfx: NONREACTIVE

## 2021-02-15 LAB — CBC WITH DIFFERENTIAL/PLATELET
Abs Immature Granulocytes: 0.01 10*3/uL (ref 0.00–0.07)
Basophils Absolute: 0 10*3/uL (ref 0.0–0.1)
Basophils Relative: 0 %
Eosinophils Absolute: 0.1 10*3/uL (ref 0.0–0.5)
Eosinophils Relative: 2 %
HCT: 32.4 % — ABNORMAL LOW (ref 36.0–46.0)
Hemoglobin: 10.3 g/dL — ABNORMAL LOW (ref 12.0–15.0)
Immature Granulocytes: 0 %
Lymphocytes Relative: 41 %
Lymphs Abs: 1.8 10*3/uL (ref 0.7–4.0)
MCH: 31.6 pg (ref 26.0–34.0)
MCHC: 31.8 g/dL (ref 30.0–36.0)
MCV: 99.4 fL (ref 80.0–100.0)
Monocytes Absolute: 0.6 10*3/uL (ref 0.1–1.0)
Monocytes Relative: 12 %
Neutro Abs: 2 10*3/uL (ref 1.7–7.7)
Neutrophils Relative %: 45 %
Platelets: 286 10*3/uL (ref 150–400)
RBC: 3.26 MIL/uL — ABNORMAL LOW (ref 3.87–5.11)
RDW: 14 % (ref 11.5–15.5)
WBC: 4.5 10*3/uL (ref 4.0–10.5)
nRBC: 0 % (ref 0.0–0.2)

## 2021-02-15 LAB — C-REACTIVE PROTEIN: CRP: 1.1 mg/dL — ABNORMAL HIGH (ref <1.0)

## 2021-02-15 MED ORDER — SODIUM CHLORIDE 0.9 % IV SOLN: INTRAVENOUS | Status: DC | PRN

## 2021-02-15 NOTE — Progress Notes (Signed)
PROGRESS NOTE    Jackie Macias  ZLD:[id number] DOB: Apr 20, 1955 DOA: 02/14/2021 PCP: Lavera Guise, MD    Chief Complaint  Patient presents with   Wound Infection    Brief Narrative:  65 year old African-American female with history of peripheral vascular disease status right 1st toe amputation several years ago, hypertension, reflux, tobacco abuse disorder presents to the ER today with after being sent to the ER from the podiatry office.  Patient was seen in podiatry office, started on oral Augmentin, sent to ED for further evaluation, her MRI significant for acute osteomyelitis and abscess, she is admitted for further evaluation.    Assessment & Plan:   Principal Problem:   Toe osteomyelitis, right (HCC) Active Problems:   Essential hypertension   PAD (peripheral artery disease) (HCC)   GERD (gastroesophageal reflux disease)   Tobacco use disorder   Hyperlipidemia    Acute osteomyelitis of right hallux with abscess . -Continue with broad-spectrum antibiotic, IV vancomycin and Zosyn for now -vascular surgery input greatly appreciated, plan for angiogram tomorrow with intervention. -Likely will need podiatry procedure after that after vascular intervention, either requires a revision hallux amputation versus partial first ray amputation of the right foot per podiatry note.  PAD (peripheral artery disease) (HCC) Continue with aspirin.   Essential hypertension Continue with Norvasc   GERD (gastroesophageal reflux disease) Stable   Tobacco use disorder Patient continues to smoke.  Nicotine patch will be ordered.  She has no intention of quitting.   Hyperlipidemia Continue simvastatin.   DVT prophylaxis: lovenox Code Status: Full Family Communication: none at bedside  Status is: Observation  The patient will require care spanning > 2 midnights and should be moved to inpatient because: Patient has acute osteomyelitis with abscess, in need of angiogram, IV  antibiotics, and possible amputation.  N.      Consultants:  Podiatry Vascular surgery   Subjective: She denies any complaints today  Objective: Vitals:   02/14/21 1447 02/14/21 2029 02/15/21 0059 02/15/21 0430  BP: (!) 178/67 (!) 156/59 140/64 (!) 156/65  Pulse: 72 72 66 (!) 57  Resp: 20 18 18 16   Temp:  98.6 F (37 C) 98.8 F (37.1 C) 98.6 F (37 C)  TempSrc:  Oral Oral Oral  SpO2: 98% 100% 98% 99%  Weight:      Height:       No intake or output data in the 24 hours ending 02/15/21 1410 Filed Weights   02/14/21 1324  Weight: 101.6 kg    Examination:  Awake Alert, Oriented X 3, No new F.N deficits, Normal affect Symmetrical Chest wall movement, Good air movement bilaterally, CTAB RRR,No Gallops,Rubs or new Murmurs, No Parasternal Heave +ve B.Sounds, Abd Soft, No tenderness, No rebound - guarding or rigidity. No Cyanosis, Clubbing or edema, No new Rash or bruise   Right great toe with previous partial toe amputation, has ulcer and hyperkeratinization present    Data Reviewed: I have personally reviewed following labs and imaging studies  CBC: Recent Labs  Lab 02/14/21 1407 02/15/21 0601  WBC 5.3 4.5  NEUTROABS 2.6 2.0  HGB 10.7* 10.3*  HCT 33.0* 32.4*  MCV 100.0 99.4  PLT 340 903    Basic Metabolic Panel: Recent Labs  Lab 02/14/21 1407 02/15/21 0601  NA 139 136  K 3.6 3.7  CL 107 106  CO2 27 25  GLUCOSE 103* 121*  BUN 13 13  CREATININE 0.63 0.65  CALCIUM 9.2 8.8*    GFR: Estimated Creatinine Clearance: 83.2  mL/min (by C-G formula based on SCr of 0.65 mg/dL).  Liver Function Tests: Recent Labs  Lab 02/14/21 1407 02/15/21 0601  AST 16 15  ALT 9 8  ALKPHOS 56 49  BILITOT 0.6 0.5  PROT 7.7 6.6  ALBUMIN 3.5 3.0*    CBG: No results for input(s): GLUCAP in the last 168 hours.   Recent Results (from the past 240 hour(s))  Resp Panel by RT-PCR (Flu A&B, Covid) Nasopharyngeal Swab     Status: None   Collection Time: 02/14/21  5:39  PM   Specimen: Nasopharyngeal Swab; Nasopharyngeal(NP) swabs in vial transport medium  Result Value Ref Range Status   SARS Coronavirus 2 by RT PCR NEGATIVE NEGATIVE Final    Comment: (NOTE) SARS-CoV-2 target nucleic acids are NOT DETECTED.  The SARS-CoV-2 RNA is generally detectable in upper respiratory specimens during the acute phase of infection. The lowest concentration of SARS-CoV-2 viral copies this assay can detect is 138 copies/mL. A negative result does not preclude SARS-Cov-2 infection and should not be used as the sole basis for treatment or other patient management decisions. A negative result may occur with  improper specimen collection/handling, submission of specimen other than nasopharyngeal swab, presence of viral mutation(s) within the areas targeted by this assay, and inadequate number of viral copies(<138 copies/mL). A negative result must be combined with clinical observations, patient history, and epidemiological information. The expected result is Negative.  Fact Sheet for Patients:  EntrepreneurPulse.com.au  Fact Sheet for Healthcare Providers:  IncredibleEmployment.be  This test is no t yet approved or cleared by the [place] FDA and  has been authorized for detection and/or diagnosis of SARS-CoV-2 by FDA under an Emergency Use Authorization (EUA). This EUA will remain  in effect (meaning this test can be used) for the duration of the COVID-19 declaration under Section 564(b)(1) of the Act, 21 U.S.C.section 360bbb-3(b)(1), unless the authorization is terminated  or revoked sooner.       Influenza A by PCR NEGATIVE NEGATIVE Final   Influenza B by PCR NEGATIVE NEGATIVE Final    Comment: (NOTE) The Xpert Xpress SARS-CoV-2/FLU/RSV plus assay is intended as an aid in the diagnosis of influenza from Nasopharyngeal swab specimens and should not be used as a sole basis for treatment. Nasal washings and aspirates are  unacceptable for Xpert Xpress SARS-CoV-2/FLU/RSV testing.  Fact Sheet for Patients: EntrepreneurPulse.com.au  Fact Sheet for Healthcare Providers: IncredibleEmployment.be  This test is not yet approved or cleared by the [place] FDA and has been authorized for detection and/or diagnosis of SARS-CoV-2 by FDA under an Emergency Use Authorization (EUA). This EUA will remain in effect (meaning this test can be used) for the duration of the COVID-19 declaration under Section 564(b)(1) of the Act, 21 U.S.C. section 360bbb-3(b)(1), unless the authorization is terminated or revoked.  Performed at [facility], [address] [place], [place] [address]   Blood culture (routine x 2)     Status: None (Preliminary result)   Collection Time: 02/14/21  9:09 PM   Specimen: BLOOD  Result Value Ref Range Status   Specimen Description BLOOD LEFT ANTECUBITAL  Final   Special Requests   Final    BOTTLES DRAWN AEROBIC AND ANAEROBIC Blood Culture adequate volume   Culture   Final    NO GROWTH < 12 HOURS Performed at [facility], [address], [place], [place] [address]    Report Status PENDING  Incomplete  Blood culture (routine x 2)     Status:  None (Preliminary result)   Collection Time: 02/14/21  9:19 PM   Specimen: BLOOD  Result Value Ref Range Status   Specimen Description BLOOD BLOOD LEFT HAND  Final   Special Requests   Final    BOTTLES DRAWN AEROBIC AND ANAEROBIC Blood Culture adequate volume   Culture   Final    NO GROWTH < 12 HOURS Performed at [facility], [address] [place], [place] [address]    Report Status PENDING  Incomplete         Radiology Studies: MR TOES RIGHT W WO CONTRAST  Result Date: 02/14/2021 CLINICAL DATA:  Osteomyelitis. History of partial great toe amputation EXAM: MRI OF THE RIGHT TOES WITHOUT AND WITH CONTRAST TECHNIQUE: Multiplanar, multisequence MR imaging of the right  forefoot was performed both before and after administration of intravenous contrast. CONTRAST:  45mL GADAVIST GADOBUTROL 1 MMOL/ML IV SOLN COMPARISON:  X-ray 02/14/2021 FINDINGS: Bones/Joint/Cartilage Status post resection of the great toe distal phalanx. There is a large erosion involving the distal aspect of the great toe proximal phalanx with marked bone marrow edema and confluent low T1 signal throughout the proximal phalanx compatible with acute osteomyelitis. Preservation of the bone marrow signal within the first metatarsal and hallux sesamoids. Remaining osseous structures of the forefoot are intact. No additional sites of erosion or marrow replacement. No fracture or dislocation. Ligaments Intact Lisfranc ligament. Collateral ligaments of the forefoot are intact. Muscles and Tendons No acute musculotendinous abnormality. Soft tissues Ulceration at the distal aspect of the great toe with small underlying rim enhancing fluid collection measuring up to 14 mm. Collection partially extends into the medullary space of the distal phalanx. Prominent surrounding soft tissue edema and enhancement compatible with cellulitis. IMPRESSION: Ulceration at the distal aspect of the great toe with acute osteomyelitis of the great toe proximal phalanx. Small abscess which partially extends intraosseously within the proximal phalanx. Electronically Signed   By: Davina Poke D.O.   On: 02/14/2021 20:02   DG Foot Complete Right  Result Date: 02/14/2021 CLINICAL DATA:  Pain. Osteomyelitis. Drainage. Wound infection a right great toe. History of amputation of great toe. EXAM: RIGHT FOOT COMPLETE - 3+ VIEW COMPARISON:  None. FINDINGS: The patient is status post partial amputation of the great toe. There is lucency in the distal aspect of the remaining proximal phalanx. IMPRESSION: 1. The patient is status post partial amputation of the great toe. There is lucency in the distal aspect of the remaining proximal phalanx. While  it is possible the deformity of the remaining proximal phalanx could be due to previous amputation, the irregularity and lucency is most suggestive of osteomyelitis. If the clinical picture is ambiguous, MRI could better evaluate. Electronically Signed   By: Dorise Bullion III M.D.   On: 02/14/2021 15:08        Scheduled Meds:  amLODipine  5 mg Oral Daily   aspirin EC  81 mg Oral Daily   clopidogrel  75 mg Oral Daily   enoxaparin (LOVENOX) injection  0.5 mg/kg Subcutaneous Q24H   nicotine  14 mg Transdermal Daily   simvastatin  10 mg Oral QPM   Continuous Infusions:  piperacillin-tazobactam (ZOSYN)  IV Stopped (02/15/21 1146)   vancomycin       LOS: 0 days        Phillips Climes, MD [facility]   To contact the attending provider between 7A-7P or the covering provider during after hours 7P-7A, please log into the web site [email or web address] and access using  universal [place] password for that web site. If you do not have the password, please call the hospital operator.  02/15/2021, 2:10 PM

## 2021-02-15 NOTE — H&P (View-Only) (Signed)
Reason for Consult:Right great toe chronic draining sinus/osteomyelitis/PAD  Referring Physician: Janese Banks, MD  Jackie Macias is an 65 y.o. female.  HPI: She comes into the ER with chronic right great toe draining sinus that has gotten worse and has not healed.  She has known PAD and has not seen Vascular surgery for 6 months.  Past Medical History:  Diagnosis Date   Arthritis    Bronchitis    COPD (chronic obstructive pulmonary disease) (HCC)    GERD (gastroesophageal reflux disease)    Gout    Hypertension     Past Surgical History:  Procedure Laterality Date   AMPUTATION Right 07/29/2017   Procedure: AMPUTATION DIGIT/ 79432;  Surgeon: Sharlotte Alamo, DPM;  Location: [facility];  Service: Podiatry;  Laterality: Right;   CHOLECYSTECTOMY     facial biopsy Right    JOINT REPLACEMENT Right 12/2016   THR   LOWER EXTREMITY ANGIOGRAPHY Right 09/27/2017   Procedure: LOWER EXTREMITY ANGIOGRAPHY;  Surgeon: Katha Cabal, MD;  Location: [facility];  Service: Cardiovascular;  Laterality: Right;   LOWER EXTREMITY ANGIOGRAPHY Right 06/17/2020   Procedure: LOWER EXTREMITY ANGIOGRAPHY;  Surgeon: Katha Cabal, MD;  Location: [facility];  Service: Cardiovascular;  Laterality: Right;   TONSILLECTOMY     TOTAL HIP ARTHROPLASTY Right 01/17/2017   Procedure: TOTAL HIP ARTHROPLASTY ANTERIOR APPROACH;  Surgeon: Lovell Sheehan, MD;  Location: [facility];  Service: Orthopedics;  Laterality: Right;    Family History  Problem Relation Age of Onset   Breast cancer Sister 7   Breast cancer Other     Social History:  reports that she has been smoking cigarettes. She has been smoking an average of .5 packs per day. She has quit using smokeless tobacco. She reports current alcohol use of about 3.0 standard drinks per week. She reports that she does not use drugs.  Allergies:  Allergies  Allergen Reactions   Lisinopril-Hydrochlorothiazide Other (See Comments)     Medications: I have reviewed the patient's current medications. Prior to Admission: (Not in a hospital admission)  Scheduled:  amLODipine  5 mg Oral Daily   aspirin EC  81 mg Oral Daily   clopidogrel  75 mg Oral Daily   enoxaparin (LOVENOX) injection  0.5 mg/kg Subcutaneous Q24H   nicotine  14 mg Transdermal Daily   simvastatin  10 mg Oral QPM   Continuous:  piperacillin-tazobactam (ZOSYN)  IV 3.375 g (02/15/21 0758)   vancomycin     XMD:[id number] **OR** acetaminophen, melatonin, nicotine polacrilex, ondansetron **OR** ondansetron (ZOFRAN) IV Anti-infectives (From admission, onward)    Start     Dose/Rate Route Frequency Ordered Stop   02/15/21 1730  vancomycin (VANCOREADY) IVPB 1500 mg/300 mL        1,500 mg 150 mL/hr over 120 Minutes Intravenous Every 24 hours 02/14/21 2105     02/14/21 2300  piperacillin-tazobactam (ZOSYN) IVPB 3.375 g        3.375 g 12.5 mL/hr over 240 Minutes Intravenous Every 8 hours 02/14/21 1800     02/14/21 1700  vancomycin (VANCOREADY) IVPB 2000 mg/400 mL        2,000 mg 200 mL/hr over 120 Minutes Intravenous  Once 02/14/21 1643 02/14/21 2005   02/14/21 1645  piperacillin-tazobactam (ZOSYN) IVPB 3.375 g        3.375 g 100 mL/hr over 30 Minutes Intravenous  Once 02/14/21 1643 02/14/21 1741       Results for orders placed or performed during the  hospital encounter of 02/14/21 (from the past 48 hour(s))  Comprehensive metabolic panel     Status: Abnormal   Collection Time: 02/14/21  2:07 PM  Result Value Ref Range   Sodium 139 135 - 145 mmol/L   Potassium 3.6 3.5 - 5.1 mmol/L   Chloride 107 98 - 111 mmol/L   CO2 27 22 - 32 mmol/L   Glucose, Bld 103 (H) 70 - 99 mg/dL    Comment: Glucose reference range applies only to samples taken after fasting for at least 8 hours.   BUN 13 8 - 23 mg/dL   Creatinine, Ser 0.63 0.44 - 1.00 mg/dL   Calcium 9.2 8.9 - 10.3 mg/dL   Total Protein 7.7 6.5 - 8.1 g/dL   Albumin 3.5 3.5 - 5.0 g/dL   AST 16 15 -  41 U/L   ALT 9 0 - 44 U/L   Alkaline Phosphatase 56 38 - 126 U/L   Total Bilirubin 0.6 0.3 - 1.2 mg/dL   GFR, Estimated >60 >60 mL/min    Comment: (NOTE) Calculated using the CKD-EPI Creatinine Equation (2021)    Anion gap 5 5 - 15    Comment: Performed at [facility], [address] [place], [place] [address]  Lactic acid, plasma     Status: None   Collection Time: 02/14/21  2:07 PM  Result Value Ref Range   Lactic Acid, Venous 0.8 0.5 - 1.9 mmol/L    Comment: Performed at [facility], [address] [place], [place] [address]  CBC with Differential     Status: Abnormal   Collection Time: 02/14/21  2:07 PM  Result Value Ref Range   WBC 5.3 4.0 - 10.5 K/uL   RBC 3.30 (L) 3.87 - 5.11 MIL/uL   Hemoglobin 10.7 (L) 12.0 - 15.0 g/dL   HCT 33.0 (L) 36.0 - 46.0 %   MCV 100.0 80.0 - 100.0 fL   MCH 32.4 26.0 - 34.0 pg   MCHC 32.4 30.0 - 36.0 g/dL   RDW 14.2 11.5 - 15.5 %   Platelets 340 150 - 400 K/uL   nRBC 0.0 0.0 - 0.2 %   Neutrophils Relative % 51 %   Neutro Abs 2.6 1.7 - 7.7 K/uL   Lymphocytes Relative 38 %   Lymphs Abs 2.0 0.7 - 4.0 K/uL   Monocytes Relative 10 %   Monocytes Absolute 0.6 0.1 - 1.0 K/uL   Eosinophils Relative 1 %   Eosinophils Absolute 0.1 0.0 - 0.5 K/uL   Basophils Relative 0 %   Basophils Absolute 0.0 0.0 - 0.1 K/uL   Immature Granulocytes 0 %   Abs Immature Granulocytes 0.01 0.00 - 0.07 K/uL    Comment: Performed at [facility], [address] [place], [place] [address]  ESR     Status: Abnormal   Collection Time: 02/14/21  2:07 PM  Result Value Ref Range   Sed Rate 60 (H) 0 - 30 mm/hr    Comment: Performed at [facility], [address] [place], [place] [address]  Resp Panel by RT-PCR (Flu A&B, Covid) Nasopharyngeal Swab     Status: None   Collection Time: 02/14/21  5:39 PM   Specimen: Nasopharyngeal Swab; Nasopharyngeal(NP) swabs in vial transport medium  Result Value Ref Range   SARS Coronavirus 2 by  RT PCR NEGATIVE NEGATIVE    Comment: (NOTE) SARS-CoV-2 target nucleic acids are NOT DETECTED.  The SARS-CoV-2 RNA is generally detectable in upper respiratory specimens during the  acute phase of infection. The lowest concentration of SARS-CoV-2 viral copies this assay can detect is 138 copies/mL. A negative result does not preclude SARS-Cov-2 infection and should not be used as the sole basis for treatment or other patient management decisions. A negative result may occur with  improper specimen collection/handling, submission of specimen other than nasopharyngeal swab, presence of viral mutation(s) within the areas targeted by this assay, and inadequate number of viral copies(<138 copies/mL). A negative result must be combined with clinical observations, patient history, and epidemiological information. The expected result is Negative.  Fact Sheet for Patients:  EntrepreneurPulse.com.au  Fact Sheet for Healthcare Providers:  IncredibleEmployment.be  This test is no t yet approved or cleared by the [place] FDA and  has been authorized for detection and/or diagnosis of SARS-CoV-2 by FDA under an Emergency Use Authorization (EUA). This EUA will remain  in effect (meaning this test can be used) for the duration of the COVID-19 declaration under Section 564(b)(1) of the Act, 21 U.S.C.section 360bbb-3(b)(1), unless the authorization is terminated  or revoked sooner.       Influenza A by PCR NEGATIVE NEGATIVE   Influenza B by PCR NEGATIVE NEGATIVE    Comment: (NOTE) The Xpert Xpress SARS-CoV-2/FLU/RSV plus assay is intended as an aid in the diagnosis of influenza from Nasopharyngeal swab specimens and should not be used as a sole basis for treatment. Nasal washings and aspirates are unacceptable for Xpert Xpress SARS-CoV-2/FLU/RSV testing.  Fact Sheet for Patients: EntrepreneurPulse.com.au  Fact Sheet for Healthcare  Providers: IncredibleEmployment.be  This test is not yet approved or cleared by the [place] FDA and has been authorized for detection and/or diagnosis of SARS-CoV-2 by FDA under an Emergency Use Authorization (EUA). This EUA will remain in effect (meaning this test can be used) for the duration of the COVID-19 declaration under Section 564(b)(1) of the Act, 21 U.S.C. section 360bbb-3(b)(1), unless the authorization is terminated or revoked.  Performed at [facility], [address] [place], [place] [address]   Blood culture (routine x 2)     Status: None (Preliminary result)   Collection Time: 02/14/21  9:09 PM   Specimen: BLOOD  Result Value Ref Range   Specimen Description BLOOD LEFT ANTECUBITAL    Special Requests      BOTTLES DRAWN AEROBIC AND ANAEROBIC Blood Culture adequate volume   Culture      NO GROWTH < 12 HOURS Performed at [facility], [address] [place], [place] [address]    Report Status PENDING   Blood culture (routine x 2)     Status: None (Preliminary result)   Collection Time: 02/14/21  9:19 PM   Specimen: BLOOD  Result Value Ref Range   Specimen Description BLOOD BLOOD LEFT HAND    Special Requests      BOTTLES DRAWN AEROBIC AND ANAEROBIC Blood Culture adequate volume   Culture      NO GROWTH < 12 HOURS Performed at [facility], [address] [place], [place] [address]    Report Status PENDING   C-reactive protein     Status: Abnormal   Collection Time: 02/14/21  9:19 PM  Result Value Ref Range   CRP 1.1 (H) <1.0 mg/dL    Comment: Performed at [facility], [place] [address], [place], [place] [address]  HIV Antibody (routine testing w rflx)     Status: None   Collection Time: 02/14/21  9:19 PM  Result Value Ref Range   HIV  Screen 4th Generation wRfx Non Reactive Non Reactive    Comment: Performed at [facility], [place] [address], [place], [place] [address]  CBC with  Differential/Platelet     Status: Abnormal   Collection Time: 02/15/21  6:01 AM  Result Value Ref Range   WBC 4.5 4.0 - 10.5 K/uL   RBC 3.26 (L) 3.87 - 5.11 MIL/uL   Hemoglobin 10.3 (L) 12.0 - 15.0 g/dL   HCT 32.4 (L) 36.0 - 46.0 %   MCV 99.4 80.0 - 100.0 fL   MCH 31.6 26.0 - 34.0 pg   MCHC 31.8 30.0 - 36.0 g/dL   RDW 14.0 11.5 - 15.5 %   Platelets 286 150 - 400 K/uL   nRBC 0.0 0.0 - 0.2 %   Neutrophils Relative % 45 %   Neutro Abs 2.0 1.7 - 7.7 K/uL   Lymphocytes Relative 41 %   Lymphs Abs 1.8 0.7 - 4.0 K/uL   Monocytes Relative 12 %   Monocytes Absolute 0.6 0.1 - 1.0 K/uL   Eosinophils Relative 2 %   Eosinophils Absolute 0.1 0.0 - 0.5 K/uL   Basophils Relative 0 %   Basophils Absolute 0.0 0.0 - 0.1 K/uL   Immature Granulocytes 0 %   Abs Immature Granulocytes 0.01 0.00 - 0.07 K/uL    Comment: Performed at [facility], [address] [place], [place] [address]  Comprehensive metabolic panel     Status: Abnormal   Collection Time: 02/15/21  6:01 AM  Result Value Ref Range   Sodium 136 135 - 145 mmol/L   Potassium 3.7 3.5 - 5.1 mmol/L   Chloride 106 98 - 111 mmol/L   CO2 25 22 - 32 mmol/L   Glucose, Bld 121 (H) 70 - 99 mg/dL    Comment: Glucose reference range applies only to samples taken after fasting for at least 8 hours.   BUN 13 8 - 23 mg/dL   Creatinine, Ser 0.65 0.44 - 1.00 mg/dL   Calcium 8.8 (L) 8.9 - 10.3 mg/dL   Total Protein 6.6 6.5 - 8.1 g/dL   Albumin 3.0 (L) 3.5 - 5.0 g/dL   AST 15 15 - 41 U/L   ALT 8 0 - 44 U/L   Alkaline Phosphatase 49 38 - 126 U/L   Total Bilirubin 0.5 0.3 - 1.2 mg/dL   GFR, Estimated >60 >60 mL/min    Comment: (NOTE) Calculated using the CKD-EPI Creatinine Equation (2021)    Anion gap 5 5 - 15    Comment: Performed at [facility], [address] [place], [place] [address]    MR TOES RIGHT W WO CONTRAST  Result Date: 02/14/2021 CLINICAL DATA:  Osteomyelitis. History of partial great toe amputation EXAM:  MRI OF THE RIGHT TOES WITHOUT AND WITH CONTRAST TECHNIQUE: Multiplanar, multisequence MR imaging of the right forefoot was performed both before and after administration of intravenous contrast. CONTRAST:  36m GADAVIST GADOBUTROL 1 MMOL/ML IV SOLN COMPARISON:  X-ray 02/14/2021 FINDINGS: Bones/Joint/Cartilage Status post resection of the great toe distal phalanx. There is a large erosion involving the distal aspect of the great toe proximal phalanx with marked bone marrow edema and confluent low T1 signal throughout the proximal phalanx compatible with acute osteomyelitis. Preservation of the bone marrow signal within the first metatarsal and hallux sesamoids. Remaining osseous structures of the forefoot are intact. No additional sites of erosion or marrow replacement. No fracture or dislocation. Ligaments Intact Lisfranc ligament. Collateral ligaments of the forefoot are intact.  Muscles and Tendons No acute musculotendinous abnormality. Soft tissues Ulceration at the distal aspect of the great toe with small underlying rim enhancing fluid collection measuring up to 14 mm. Collection partially extends into the medullary space of the distal phalanx. Prominent surrounding soft tissue edema and enhancement compatible with cellulitis. IMPRESSION: Ulceration at the distal aspect of the great toe with acute osteomyelitis of the great toe proximal phalanx. Small abscess which partially extends intraosseously within the proximal phalanx. Electronically Signed   By: Davina Poke D.O.   On: 02/14/2021 20:02   DG Foot Complete Right  Result Date: 02/14/2021 CLINICAL DATA:  Pain. Osteomyelitis. Drainage. Wound infection a right great toe. History of amputation of great toe. EXAM: RIGHT FOOT COMPLETE - 3+ VIEW COMPARISON:  None. FINDINGS: The patient is status post partial amputation of the great toe. There is lucency in the distal aspect of the remaining proximal phalanx. IMPRESSION: 1. The patient is status post  partial amputation of the great toe. There is lucency in the distal aspect of the remaining proximal phalanx. While it is possible the deformity of the remaining proximal phalanx could be due to previous amputation, the irregularity and lucency is most suggestive of osteomyelitis. If the clinical picture is ambiguous, MRI could better evaluate. Electronically Signed   By: Dorise Bullion III M.D.   On: 02/14/2021 15:08    Review of Systems  All other systems reviewed and are negative. Blood pressure (!) 156/65, pulse (!) 57, temperature 98.6 F (37 C), temperature source Oral, resp. rate 16, height 5' 6" (1.676 m), weight 101.6 kg, SpO2 99 %. Physical Exam Vitals and nursing note reviewed.  Constitutional:      Appearance: Normal appearance.  HENT:     Head: Normocephalic and atraumatic.     Nose: Nose normal.  Eyes:     Extraocular Movements: Extraocular movements intact.     Pupils: Pupils are equal, round, and reactive to light.  Cardiovascular:     Rate and Rhythm: Normal rate and regular rhythm.     Pulses:          Dorsalis pedis pulses are detected w/ Doppler on the right side and detected w/ Doppler on the left side.  Pulmonary:     Effort: Pulmonary effort is normal.     Breath sounds: Normal breath sounds.  Abdominal:     General: Abdomen is flat. Bowel sounds are normal.     Palpations: Abdomen is soft.  Musculoskeletal:        General: Normal range of motion.     Cervical back: Normal range of motion and neck supple.       Feet:  Feet:     Right foot:     Skin integrity: Ulcer present.     Left foot:     Skin integrity: Skin integrity normal.     Toenail Condition: Left toenails are abnormally thick.  Skin:    General: Skin is warm and dry.  Neurological:     General: No focal deficit present.     Mental Status: She is alert and oriented to person, place, and time.  Psychiatric:        Mood and Affect: Mood normal.        Behavior: Behavior normal.     Assessment/Plan: Patient with PAD and right great toe chronic infection/Osteomyelitis I will schedule her for aortogram with runoff of the right lower limb.  Elmore Guise 02/15/2021, 11:20 AM

## 2021-02-15 NOTE — Consult Note (Signed)
PODIATRY / FOOT AND ANKLE SURGERY CONSULTATION NOTE  Requesting Physician: Dr. Waldron Labs  Reason for consult: Right toe infection  Chief Complaint: Right toe pain   HPI: Jackie Macias is a 65 y.o. female who presents with a nonhealing ulceration to the distal aspect the right hallux.  Patient has undergone a previous right partial hallux amputation with Dr. Cleda Mccreedy in an outpatient setting.  Patient has followed up with Dr. Cleda Mccreedy and was seen this past Friday and was noted to have a draining sinus present that probes to bone concerning for osteomyelitis.  Patient also had dark and discolored tissue present to the right hallux concerning for gangrene.  Patient was advised to go to the emergency room for further work-up.  Patient has been admitted to the hospital and podiatry team was consulted for further evaluation.  PMHx:  Past Medical History:  Diagnosis Date   Arthritis    Bronchitis    COPD (chronic obstructive pulmonary disease) (HCC)    GERD (gastroesophageal reflux disease)    Gout    Hypertension     Surgical Hx:  Past Surgical History:  Procedure Laterality Date   AMPUTATION Right 07/29/2017   Procedure: AMPUTATION DIGIT/ 31540;  Surgeon: Sharlotte Alamo, DPM;  Location: [facility];  Service: Podiatry;  Laterality: Right;   CHOLECYSTECTOMY     facial biopsy Right    JOINT REPLACEMENT Right 12/2016   THR   LOWER EXTREMITY ANGIOGRAPHY Right 09/27/2017   Procedure: LOWER EXTREMITY ANGIOGRAPHY;  Surgeon: Katha Cabal, MD;  Location: [facility];  Service: Cardiovascular;  Laterality: Right;   LOWER EXTREMITY ANGIOGRAPHY Right 06/17/2020   Procedure: LOWER EXTREMITY ANGIOGRAPHY;  Surgeon: Katha Cabal, MD;  Location: [facility];  Service: Cardiovascular;  Laterality: Right;   TONSILLECTOMY     TOTAL HIP ARTHROPLASTY Right 01/17/2017   Procedure: TOTAL HIP ARTHROPLASTY ANTERIOR APPROACH;  Surgeon: Lovell Sheehan, MD;  Location: [facility];  Service:  Orthopedics;  Laterality: Right;    FHx:  Family History  Problem Relation Age of Onset   Breast cancer Sister 49   Breast cancer Other     Social History:  reports that she has been smoking cigarettes. She has been smoking an average of .5 packs per day. She has quit using smokeless tobacco. She reports current alcohol use of about 3.0 standard drinks per week. She reports that she does not use drugs.  Allergies:  Allergies  Allergen Reactions   Lisinopril-Hydrochlorothiazide Other (See Comments)   (Not in a hospital admission)   Physical Exam: General: Alert and oriented.  No apparent distress.  Vascular: DP/PT pulses nonpalpable bilateral.  Capillary fill time appears to be delayed to the right hallux.  No hair growth noted to digits bilateral.  Right hallux appears to be edematous and erythematous, no proximal streaking noted.  Neuro: Light touch sensation slightly diminished to bilateral lower extremities.  Derm: Ulceration noted to the distal lateral aspect of the incision line where the patient had a partial hallux amputation right foot, wound measures approximately 0.3 x 0.2 x 0.6 cm and does probe to bone which does produce pain for patient.  Serous drainage noted from the wound site, associated erythema and edema present to the area but not extending proximal to the metatarsal phalangeal joint.  Skin appears to be somewhat thin and atrophic to both feet.    MSK: Right partial hallux amputation  Results for orders placed or performed during the hospital encounter of 02/14/21 (from  the past 48 hour(s))  Comprehensive metabolic panel     Status: Abnormal   Collection Time: 02/14/21  2:07 PM  Result Value Ref Range   Sodium 139 135 - 145 mmol/L   Potassium 3.6 3.5 - 5.1 mmol/L   Chloride 107 98 - 111 mmol/L   CO2 27 22 - 32 mmol/L   Glucose, Bld 103 (H) 70 - 99 mg/dL    Comment: Glucose reference range applies only to samples taken after fasting for at least 8 hours.    BUN 13 8 - 23 mg/dL   Creatinine, Ser 0.63 0.44 - 1.00 mg/dL   Calcium 9.2 8.9 - 10.3 mg/dL   Total Protein 7.7 6.5 - 8.1 g/dL   Albumin 3.5 3.5 - 5.0 g/dL   AST 16 15 - 41 U/L   ALT 9 0 - 44 U/L   Alkaline Phosphatase 56 38 - 126 U/L   Total Bilirubin 0.6 0.3 - 1.2 mg/dL   GFR, Estimated >60 >60 mL/min    Comment: (NOTE) Calculated using the CKD-EPI Creatinine Equation (2021)    Anion gap 5 5 - 15    Comment: Performed at [facility], [address] [place], [place] [address]  Lactic acid, plasma     Status: None   Collection Time: 02/14/21  2:07 PM  Result Value Ref Range   Lactic Acid, Venous 0.8 0.5 - 1.9 mmol/L    Comment: Performed at [facility], [address] [place], [place] [address]  CBC with Differential     Status: Abnormal   Collection Time: 02/14/21  2:07 PM  Result Value Ref Range   WBC 5.3 4.0 - 10.5 K/uL   RBC 3.30 (L) 3.87 - 5.11 MIL/uL   Hemoglobin 10.7 (L) 12.0 - 15.0 g/dL   HCT 33.0 (L) 36.0 - 46.0 %   MCV 100.0 80.0 - 100.0 fL   MCH 32.4 26.0 - 34.0 pg   MCHC 32.4 30.0 - 36.0 g/dL   RDW 14.2 11.5 - 15.5 %   Platelets 340 150 - 400 K/uL   nRBC 0.0 0.0 - 0.2 %   Neutrophils Relative % 51 %   Neutro Abs 2.6 1.7 - 7.7 K/uL   Lymphocytes Relative 38 %   Lymphs Abs 2.0 0.7 - 4.0 K/uL   Monocytes Relative 10 %   Monocytes Absolute 0.6 0.1 - 1.0 K/uL   Eosinophils Relative 1 %   Eosinophils Absolute 0.1 0.0 - 0.5 K/uL   Basophils Relative 0 %   Basophils Absolute 0.0 0.0 - 0.1 K/uL   Immature Granulocytes 0 %   Abs Immature Granulocytes 0.01 0.00 - 0.07 K/uL    Comment: Performed at [facility], [address] [place], [place] [address]  ESR     Status: Abnormal   Collection Time: 02/14/21  2:07 PM  Result Value Ref Range   Sed Rate 60 (H) 0 - 30 mm/hr    Comment: Performed at [facility], [address] [place], [place] [address]  Resp Panel by RT-PCR (Flu A&B, Covid) Nasopharyngeal Swab     Status:  None   Collection Time: 02/14/21  5:39 PM   Specimen: Nasopharyngeal Swab; Nasopharyngeal(NP) swabs in vial transport medium  Result Value Ref Range   SARS Coronavirus 2 by RT PCR NEGATIVE NEGATIVE    Comment: (NOTE) SARS-CoV-2 target nucleic acids are NOT DETECTED.  The SARS-CoV-2 RNA is generally detectable in upper respiratory specimens during the acute phase of infection. The  lowest concentration of SARS-CoV-2 viral copies this assay can detect is 138 copies/mL. A negative result does not preclude SARS-Cov-2 infection and should not be used as the sole basis for treatment or other patient management decisions. A negative result may occur with  improper specimen collection/handling, submission of specimen other than nasopharyngeal swab, presence of viral mutation(s) within the areas targeted by this assay, and inadequate number of viral copies(<138 copies/mL). A negative result must be combined with clinical observations, patient history, and epidemiological information. The expected result is Negative.  Fact Sheet for Patients:  EntrepreneurPulse.com.au  Fact Sheet for Healthcare Providers:  IncredibleEmployment.be  This test is no t yet approved or cleared by the [place] FDA and  has been authorized for detection and/or diagnosis of SARS-CoV-2 by FDA under an Emergency Use Authorization (EUA). This EUA will remain  in effect (meaning this test can be used) for the duration of the COVID-19 declaration under Section 564(b)(1) of the Act, 21 U.S.C.section 360bbb-3(b)(1), unless the authorization is terminated  or revoked sooner.       Influenza A by PCR NEGATIVE NEGATIVE   Influenza B by PCR NEGATIVE NEGATIVE    Comment: (NOTE) The Xpert Xpress SARS-CoV-2/FLU/RSV plus assay is intended as an aid in the diagnosis of influenza from Nasopharyngeal swab specimens and should not be used as a sole basis for treatment. Nasal washings  and aspirates are unacceptable for Xpert Xpress SARS-CoV-2/FLU/RSV testing.  Fact Sheet for Patients: EntrepreneurPulse.com.au  Fact Sheet for Healthcare Providers: IncredibleEmployment.be  This test is not yet approved or cleared by the [place] FDA and has been authorized for detection and/or diagnosis of SARS-CoV-2 by FDA under an Emergency Use Authorization (EUA). This EUA will remain in effect (meaning this test can be used) for the duration of the COVID-19 declaration under Section 564(b)(1) of the Act, 21 U.S.C. section 360bbb-3(b)(1), unless the authorization is terminated or revoked.  Performed at [facility], [address] [place], [place] [address]   Blood culture (routine x 2)     Status: None (Preliminary result)   Collection Time: 02/14/21  9:09 PM   Specimen: BLOOD  Result Value Ref Range   Specimen Description BLOOD LEFT ANTECUBITAL    Special Requests      BOTTLES DRAWN AEROBIC AND ANAEROBIC Blood Culture adequate volume   Culture      NO GROWTH < 12 HOURS Performed at [facility], [address] [place], [place] [address]    Report Status PENDING   Blood culture (routine x 2)     Status: None (Preliminary result)   Collection Time: 02/14/21  9:19 PM   Specimen: BLOOD  Result Value Ref Range   Specimen Description BLOOD BLOOD LEFT HAND    Special Requests      BOTTLES DRAWN AEROBIC AND ANAEROBIC Blood Culture adequate volume   Culture      NO GROWTH < 12 HOURS Performed at [facility], [address] [place], [place] [address]    Report Status PENDING   C-reactive protein     Status: Abnormal   Collection Time: 02/14/21  9:19 PM  Result Value Ref Range   CRP 1.1 (H) <1.0 mg/dL    Comment: Performed at [facility], [place] [address], [place], [place] [address]  HIV Antibody (routine testing w rflx)     Status: None   Collection Time: 02/14/21  9:19 PM  Result Value Ref  Range   HIV Screen 4th Generation wRfx  Non Reactive Non Reactive    Comment: Performed at [facility], [place] [address], [place], [place] [address]  CBC with Differential/Platelet     Status: Abnormal   Collection Time: 02/15/21  6:01 AM  Result Value Ref Range   WBC 4.5 4.0 - 10.5 K/uL   RBC 3.26 (L) 3.87 - 5.11 MIL/uL   Hemoglobin 10.3 (L) 12.0 - 15.0 g/dL   HCT 32.4 (L) 36.0 - 46.0 %   MCV 99.4 80.0 - 100.0 fL   MCH 31.6 26.0 - 34.0 pg   MCHC 31.8 30.0 - 36.0 g/dL   RDW 14.0 11.5 - 15.5 %   Platelets 286 150 - 400 K/uL   nRBC 0.0 0.0 - 0.2 %   Neutrophils Relative % 45 %   Neutro Abs 2.0 1.7 - 7.7 K/uL   Lymphocytes Relative 41 %   Lymphs Abs 1.8 0.7 - 4.0 K/uL   Monocytes Relative 12 %   Monocytes Absolute 0.6 0.1 - 1.0 K/uL   Eosinophils Relative 2 %   Eosinophils Absolute 0.1 0.0 - 0.5 K/uL   Basophils Relative 0 %   Basophils Absolute 0.0 0.0 - 0.1 K/uL   Immature Granulocytes 0 %   Abs Immature Granulocytes 0.01 0.00 - 0.07 K/uL    Comment: Performed at [facility], [address] [place], [place] [address]  Comprehensive metabolic panel     Status: Abnormal   Collection Time: 02/15/21  6:01 AM  Result Value Ref Range   Sodium 136 135 - 145 mmol/L   Potassium 3.7 3.5 - 5.1 mmol/L   Chloride 106 98 - 111 mmol/L   CO2 25 22 - 32 mmol/L   Glucose, Bld 121 (H) 70 - 99 mg/dL    Comment: Glucose reference range applies only to samples taken after fasting for at least 8 hours.   BUN 13 8 - 23 mg/dL   Creatinine, Ser 0.65 0.44 - 1.00 mg/dL   Calcium 8.8 (L) 8.9 - 10.3 mg/dL   Total Protein 6.6 6.5 - 8.1 g/dL   Albumin 3.0 (L) 3.5 - 5.0 g/dL   AST 15 15 - 41 U/L   ALT 8 0 - 44 U/L   Alkaline Phosphatase 49 38 - 126 U/L   Total Bilirubin 0.5 0.3 - 1.2 mg/dL   GFR, Estimated >60 >60 mL/min    Comment: (NOTE) Calculated using the CKD-EPI Creatinine Equation (2021)    Anion gap 5 5 - 15    Comment: Performed at [facility], [address] [place], [place] [address]   MR TOES RIGHT W WO CONTRAST  Result Date: 02/14/2021 CLINICAL DATA:  Osteomyelitis. History of partial great toe amputation EXAM: MRI OF THE RIGHT TOES WITHOUT AND WITH CONTRAST TECHNIQUE: Multiplanar, multisequence MR imaging of the right forefoot was performed both before and after administration of intravenous contrast. CONTRAST:  73m GADAVIST GADOBUTROL 1 MMOL/ML IV SOLN COMPARISON:  X-ray 02/14/2021 FINDINGS: Bones/Joint/Cartilage Status post resection of the great toe distal phalanx. There is a large erosion involving the distal aspect of the great toe proximal phalanx with marked bone marrow edema and confluent low T1 signal throughout the proximal phalanx compatible with acute osteomyelitis. Preservation of the bone marrow signal within the first metatarsal and hallux sesamoids. Remaining osseous structures of the forefoot are intact. No additional sites of erosion or marrow replacement. No fracture or dislocation. Ligaments Intact Lisfranc ligament. Collateral ligaments of the forefoot are intact. Muscles and Tendons No acute musculotendinous  abnormality. Soft tissues Ulceration at the distal aspect of the great toe with small underlying rim enhancing fluid collection measuring up to 14 mm. Collection partially extends into the medullary space of the distal phalanx. Prominent surrounding soft tissue edema and enhancement compatible with cellulitis. IMPRESSION: Ulceration at the distal aspect of the great toe with acute osteomyelitis of the great toe proximal phalanx. Small abscess which partially extends intraosseously within the proximal phalanx. Electronically Signed   By: Davina Poke D.O.   On: 02/14/2021 20:02   DG Foot Complete Right  Result Date: 02/14/2021 CLINICAL DATA:  Pain. Osteomyelitis. Drainage. Wound infection a right great toe. History of amputation of great toe. EXAM: RIGHT FOOT COMPLETE - 3+ VIEW COMPARISON:  None. FINDINGS: The patient is status  post partial amputation of the great toe. There is lucency in the distal aspect of the remaining proximal phalanx. IMPRESSION: 1. The patient is status post partial amputation of the great toe. There is lucency in the distal aspect of the remaining proximal phalanx. While it is possible the deformity of the remaining proximal phalanx could be due to previous amputation, the irregularity and lucency is most suggestive of osteomyelitis. If the clinical picture is ambiguous, MRI could better evaluate. Electronically Signed   By: Dorise Bullion III M.D.   On: 02/14/2021 15:08    Blood pressure (!) 156/65, pulse (!) 57, temperature 98.6 F (37 C), temperature source Oral, resp. rate 16, height 5' 6"  (1.676 m), weight 101.6 kg, SpO2 99 %.  Assessment Right hallux osteomyelitis with associated gangrene PVD  Plan -Patient seen and examined. -X-ray imaging and MRI imaging reviewed and discussed with patient detail showing osteomyelitis present to the remaining portion of the right hallux.  Does not extend past the base of the proximal phalanx, first metatarsal head appears to be free of infection.  Possible abscess also noted distally. -Clinically foot appears to be fairly stable at this time, labs and vitals also appear to be stable. -Wound culture taken today and sent off. -Recommend Betadine wet-to-dry dressings to be placed daily.  Order placed.  Order placed also for surgical shoe right foot. -Appreciate vascular recommendations.  Will await vascular recommendations prior to revision right hallux amputation. -Discussed with patient due to osteomyelitis present patient either requires a revision hallux amputation versus partial first ray amputation right foot. -We will plan to perform procedures at some point after vascular examination and potential angiogram with intervention.  Likely to be at some point next week.  Caroline More, DPM 02/15/2021, 10:46 AM

## 2021-02-15 NOTE — Consult Note (Signed)
Reason for Consult:Right great toe chronic draining sinus/osteomyelitis/PAD  Referring Physician: Eldergawy, Dawood S, MD  Jackie Macias is an 65 y.o. female.  HPI: She comes into the ER with chronic right great toe draining sinus that has gotten worse and has not healed.  She has known PAD and has not seen Vascular surgery for 6 months.  Past Medical History:  Diagnosis Date   Arthritis    Bronchitis    COPD (chronic obstructive pulmonary disease) (HCC)    GERD (gastroesophageal reflux disease)    Gout    Hypertension     Past Surgical History:  Procedure Laterality Date   AMPUTATION Right 07/29/2017   Procedure: AMPUTATION DIGIT/ 28825;  Surgeon: Cline, Todd, DPM;  Location: [facility];  Service: Podiatry;  Laterality: Right;   CHOLECYSTECTOMY     facial biopsy Right    JOINT REPLACEMENT Right 12/2016   THR   LOWER EXTREMITY ANGIOGRAPHY Right 09/27/2017   Procedure: LOWER EXTREMITY ANGIOGRAPHY;  Surgeon: Schnier, Gregory G, MD;  Location: [facility];  Service: Cardiovascular;  Laterality: Right;   LOWER EXTREMITY ANGIOGRAPHY Right 06/17/2020   Procedure: LOWER EXTREMITY ANGIOGRAPHY;  Surgeon: Schnier, Gregory G, MD;  Location: [facility];  Service: Cardiovascular;  Laterality: Right;   TONSILLECTOMY     TOTAL HIP ARTHROPLASTY Right 01/17/2017   Procedure: TOTAL HIP ARTHROPLASTY ANTERIOR APPROACH;  Surgeon: Bowers, Keishawna Carranza R, MD;  Location: [facility];  Service: Orthopedics;  Laterality: Right;    Family History  Problem Relation Age of Onset   Breast cancer Sister 50   Breast cancer Other     Social History:  reports that she has been smoking cigarettes. She has been smoking an average of .5 packs per day. She has quit using smokeless tobacco. She reports current alcohol use of about 3.0 standard drinks per week. She reports that she does not use drugs.  Allergies:  Allergies  Allergen Reactions   Lisinopril-Hydrochlorothiazide Other (See Comments)     Medications: I have reviewed the patient's current medications. Prior to Admission: (Not in a hospital admission)  Scheduled:  amLODipine  5 mg Oral Daily   aspirin EC  81 mg Oral Daily   clopidogrel  75 mg Oral Daily   enoxaparin (LOVENOX) injection  0.5 mg/kg Subcutaneous Q24H   nicotine  14 mg Transdermal Daily   simvastatin  10 mg Oral QPM   Continuous:  piperacillin-tazobactam (ZOSYN)  IV 3.375 g (02/15/21 0758)   vancomycin     PRN:acetaminophen **OR** acetaminophen, melatonin, nicotine polacrilex, ondansetron **OR** ondansetron (ZOFRAN) IV Anti-infectives (From admission, onward)    Start     Dose/Rate Route Frequency Ordered Stop   02/15/21 1730  vancomycin (VANCOREADY) IVPB 1500 mg/300 mL        1,500 mg 150 mL/hr over 120 Minutes Intravenous Every 24 hours 02/14/21 2105     02/14/21 2300  piperacillin-tazobactam (ZOSYN) IVPB 3.375 g        3.375 g 12.5 mL/hr over 240 Minutes Intravenous Every 8 hours 02/14/21 1800     02/14/21 1700  vancomycin (VANCOREADY) IVPB 2000 mg/400 mL        2,000 mg 200 mL/hr over 120 Minutes Intravenous  Once 02/14/21 1643 02/14/21 2005   02/14/21 1645  piperacillin-tazobactam (ZOSYN) IVPB 3.375 g        3.375 g 100 mL/hr over 30 Minutes Intravenous  Once 02/14/21 1643 02/14/21 1741       Results for orders placed or performed during the   hospital encounter of 02/14/21 (from the past 48 hour(s))  Comprehensive metabolic panel     Status: Abnormal   Collection Time: 02/14/21  2:07 PM  Result Value Ref Range   Sodium 139 135 - 145 mmol/L   Potassium 3.6 3.5 - 5.1 mmol/L   Chloride 107 98 - 111 mmol/L   CO2 27 22 - 32 mmol/L   Glucose, Bld 103 (H) 70 - 99 mg/dL    Comment: Glucose reference range applies only to samples taken after fasting for at least 8 hours.   BUN 13 8 - 23 mg/dL   Creatinine, Ser 0.63 0.44 - 1.00 mg/dL   Calcium 9.2 8.9 - 10.3 mg/dL   Total Protein 7.7 6.5 - 8.1 g/dL   Albumin 3.5 3.5 - 5.0 g/dL   AST 16 15 -  41 U/L   ALT 9 0 - 44 U/L   Alkaline Phosphatase 56 38 - 126 U/L   Total Bilirubin 0.6 0.3 - 1.2 mg/dL   GFR, Estimated >60 >60 mL/min    Comment: (NOTE) Calculated using the CKD-EPI Creatinine Equation (2021)    Anion gap 5 5 - 15    Comment: Performed at [facility], [address] [place], [place] [address]  Lactic acid, plasma     Status: None   Collection Time: 02/14/21  2:07 PM  Result Value Ref Range   Lactic Acid, Venous 0.8 0.5 - 1.9 mmol/L    Comment: Performed at [facility], [address] [place], [place] [address]  CBC with Differential     Status: Abnormal   Collection Time: 02/14/21  2:07 PM  Result Value Ref Range   WBC 5.3 4.0 - 10.5 K/uL   RBC 3.30 (L) 3.87 - 5.11 MIL/uL   Hemoglobin 10.7 (L) 12.0 - 15.0 g/dL   HCT 33.0 (L) 36.0 - 46.0 %   MCV 100.0 80.0 - 100.0 fL   MCH 32.4 26.0 - 34.0 pg   MCHC 32.4 30.0 - 36.0 g/dL   RDW 14.2 11.5 - 15.5 %   Platelets 340 150 - 400 K/uL   nRBC 0.0 0.0 - 0.2 %   Neutrophils Relative % 51 %   Neutro Abs 2.6 1.7 - 7.7 K/uL   Lymphocytes Relative 38 %   Lymphs Abs 2.0 0.7 - 4.0 K/uL   Monocytes Relative 10 %   Monocytes Absolute 0.6 0.1 - 1.0 K/uL   Eosinophils Relative 1 %   Eosinophils Absolute 0.1 0.0 - 0.5 K/uL   Basophils Relative 0 %   Basophils Absolute 0.0 0.0 - 0.1 K/uL   Immature Granulocytes 0 %   Abs Immature Granulocytes 0.01 0.00 - 0.07 K/uL    Comment: Performed at [facility], [address] [place], [place] [address]  ESR     Status: Abnormal   Collection Time: 02/14/21  2:07 PM  Result Value Ref Range   Sed Rate 60 (H) 0 - 30 mm/hr    Comment: Performed at [facility], [address] [place], [place] [address]  Resp Panel by RT-PCR (Flu A&B, Covid) Nasopharyngeal Swab     Status: None   Collection Time: 02/14/21  5:39 PM   Specimen: Nasopharyngeal Swab; Nasopharyngeal(NP) swabs in vial transport medium  Result Value Ref Range   SARS Coronavirus 2 by  RT PCR NEGATIVE NEGATIVE    Comment: (NOTE) SARS-CoV-2 target nucleic acids are NOT DETECTED.  The SARS-CoV-2 RNA is generally detectable in upper respiratory specimens during the   acute phase of infection. The lowest concentration of SARS-CoV-2 viral copies this assay can detect is 138 copies/mL. A negative result does not preclude SARS-Cov-2 infection and should not be used as the sole basis for treatment or other patient management decisions. A negative result may occur with  improper specimen collection/handling, submission of specimen other than nasopharyngeal swab, presence of viral mutation(s) within the areas targeted by this assay, and inadequate number of viral copies(<138 copies/mL). A negative result must be combined with clinical observations, patient history, and epidemiological information. The expected result is Negative.  Fact Sheet for Patients:  https://www.fda.gov/media/152166/download  Fact Sheet for Healthcare Providers:  https://www.fda.gov/media/152162/download  This test is no t yet approved or cleared by the [place] FDA and  has been authorized for detection and/or diagnosis of SARS-CoV-2 by FDA under an Emergency Use Authorization (EUA). This EUA will remain  in effect (meaning this test can be used) for the duration of the COVID-19 declaration under Section 564(b)(1) of the Act, 21 U.S.C.section 360bbb-3(b)(1), unless the authorization is terminated  or revoked sooner.       Influenza A by PCR NEGATIVE NEGATIVE   Influenza B by PCR NEGATIVE NEGATIVE    Comment: (NOTE) The Xpert Xpress SARS-CoV-2/FLU/RSV plus assay is intended as an aid in the diagnosis of influenza from Nasopharyngeal swab specimens and should not be used as a sole basis for treatment. Nasal washings and aspirates are unacceptable for Xpert Xpress SARS-CoV-2/FLU/RSV testing.  Fact Sheet for Patients: https://www.fda.gov/media/152166/download  Fact Sheet for Healthcare  Providers: https://www.fda.gov/media/152162/download  This test is not yet approved or cleared by the [place] FDA and has been authorized for detection and/or diagnosis of SARS-CoV-2 by FDA under an Emergency Use Authorization (EUA). This EUA will remain in effect (meaning this test can be used) for the duration of the COVID-19 declaration under Section 564(b)(1) of the Act, 21 U.S.C. section 360bbb-3(b)(1), unless the authorization is terminated or revoked.  Performed at [facility], [address] [place], [place] [address]   Blood culture (routine x 2)     Status: None (Preliminary result)   Collection Time: 02/14/21  9:09 PM   Specimen: BLOOD  Result Value Ref Range   Specimen Description BLOOD LEFT ANTECUBITAL    Special Requests      BOTTLES DRAWN AEROBIC AND ANAEROBIC Blood Culture adequate volume   Culture      NO GROWTH < 12 HOURS Performed at [facility], [address] [place], [place] [address]    Report Status PENDING   Blood culture (routine x 2)     Status: None (Preliminary result)   Collection Time: 02/14/21  9:19 PM   Specimen: BLOOD  Result Value Ref Range   Specimen Description BLOOD BLOOD LEFT HAND    Special Requests      BOTTLES DRAWN AEROBIC AND ANAEROBIC Blood Culture adequate volume   Culture      NO GROWTH < 12 HOURS Performed at [facility], [address] , [place] [address]    Report Status PENDING   C-reactive protein     Status: Abnormal   Collection Time: 02/14/21  9:19 PM  Result Value Ref Range   CRP 1.1 (H) <1.0 mg/dL    Comment: Performed at [facility], [address] [place], [place] [address]  HIV Antibody (routine testing w rflx)     Status: None   Collection Time: 02/14/21  9:19 PM  Result Value Ref Range   HIV   Screen 4th Generation wRfx Non Reactive Non Reactive    Comment: Performed at [facility], [address] [place], [place] [address]  CBC with  Differential/Platelet     Status: Abnormal   Collection Time: 02/15/21  6:01 AM  Result Value Ref Range   WBC 4.5 4.0 - 10.5 K/uL   RBC 3.26 (L) 3.87 - 5.11 MIL/uL   Hemoglobin 10.3 (L) 12.0 - 15.0 g/dL   HCT 32.4 (L) 36.0 - 46.0 %   MCV 99.4 80.0 - 100.0 fL   MCH 31.6 26.0 - 34.0 pg   MCHC 31.8 30.0 - 36.0 g/dL   RDW 14.0 11.5 - 15.5 %   Platelets 286 150 - 400 K/uL   nRBC 0.0 0.0 - 0.2 %   Neutrophils Relative % 45 %   Neutro Abs 2.0 1.7 - 7.7 K/uL   Lymphocytes Relative 41 %   Lymphs Abs 1.8 0.7 - 4.0 K/uL   Monocytes Relative 12 %   Monocytes Absolute 0.6 0.1 - 1.0 K/uL   Eosinophils Relative 2 %   Eosinophils Absolute 0.1 0.0 - 0.5 K/uL   Basophils Relative 0 %   Basophils Absolute 0.0 0.0 - 0.1 K/uL   Immature Granulocytes 0 %   Abs Immature Granulocytes 0.01 0.00 - 0.07 K/uL    Comment: Performed at [facility], [address] [place], [place] [address]  Comprehensive metabolic panel     Status: Abnormal   Collection Time: 02/15/21  6:01 AM  Result Value Ref Range   Sodium 136 135 - 145 mmol/L   Potassium 3.7 3.5 - 5.1 mmol/L   Chloride 106 98 - 111 mmol/L   CO2 25 22 - 32 mmol/L   Glucose, Bld 121 (H) 70 - 99 mg/dL    Comment: Glucose reference range applies only to samples taken after fasting for at least 8 hours.   BUN 13 8 - 23 mg/dL   Creatinine, Ser 0.65 0.44 - 1.00 mg/dL   Calcium 8.8 (L) 8.9 - 10.3 mg/dL   Total Protein 6.6 6.5 - 8.1 g/dL   Albumin 3.0 (L) 3.5 - 5.0 g/dL   AST 15 15 - 41 U/L   ALT 8 0 - 44 U/L   Alkaline Phosphatase 49 38 - 126 U/L   Total Bilirubin 0.5 0.3 - 1.2 mg/dL   GFR, Estimated >60 >60 mL/min    Comment: (NOTE) Calculated using the CKD-EPI Creatinine Equation (2021)    Anion gap 5 5 - 15    Comment: Performed at [facility], [address] [place], [place] [address]    MR TOES RIGHT W WO CONTRAST  Result Date: 02/14/2021 CLINICAL DATA:  Osteomyelitis. History of partial great toe amputation EXAM:  MRI OF THE RIGHT TOES WITHOUT AND WITH CONTRAST TECHNIQUE: Multiplanar, multisequence MR imaging of the right forefoot was performed both before and after administration of intravenous contrast. CONTRAST:  10mL GADAVIST GADOBUTROL 1 MMOL/ML IV SOLN COMPARISON:  X-ray 02/14/2021 FINDINGS: Bones/Joint/Cartilage Status post resection of the great toe distal phalanx. There is a large erosion involving the distal aspect of the great toe proximal phalanx with marked bone marrow edema and confluent low T1 signal throughout the proximal phalanx compatible with acute osteomyelitis. Preservation of the bone marrow signal within the first metatarsal and hallux sesamoids. Remaining osseous structures of the forefoot are intact. No additional sites of erosion or marrow replacement. No fracture or dislocation. Ligaments Intact Lisfranc ligament. Collateral ligaments of the forefoot are intact.   Muscles and Tendons No acute musculotendinous abnormality. Soft tissues Ulceration at the distal aspect of the great toe with small underlying rim enhancing fluid collection measuring up to 14 mm. Collection partially extends into the medullary space of the distal phalanx. Prominent surrounding soft tissue edema and enhancement compatible with cellulitis. IMPRESSION: Ulceration at the distal aspect of the great toe with acute osteomyelitis of the great toe proximal phalanx. Small abscess which partially extends intraosseously within the proximal phalanx. Electronically Signed   By: Davina Poke D.O.   On: 02/14/2021 20:02   DG Foot Complete Right  Result Date: 02/14/2021 CLINICAL DATA:  Pain. Osteomyelitis. Drainage. Wound infection a right great toe. History of amputation of great toe. EXAM: RIGHT FOOT COMPLETE - 3+ VIEW COMPARISON:  None. FINDINGS: The patient is status post partial amputation of the great toe. There is lucency in the distal aspect of the remaining proximal phalanx. IMPRESSION: 1. The patient is status post  partial amputation of the great toe. There is lucency in the distal aspect of the remaining proximal phalanx. While it is possible the deformity of the remaining proximal phalanx could be due to previous amputation, the irregularity and lucency is most suggestive of osteomyelitis. If the clinical picture is ambiguous, MRI could better evaluate. Electronically Signed   By: Dorise Bullion III M.D.   On: 02/14/2021 15:08    Review of Systems  All other systems reviewed and are negative. Blood pressure (!) 156/65, pulse (!) 57, temperature 98.6 F (37 C), temperature source Oral, resp. rate 16, height 5' 6" (1.676 m), weight 101.6 kg, SpO2 99 %. Physical Exam Vitals and nursing note reviewed.  Constitutional:      Appearance: Normal appearance.  HENT:     Head: Normocephalic and atraumatic.     Nose: Nose normal.  Eyes:     Extraocular Movements: Extraocular movements intact.     Pupils: Pupils are equal, round, and reactive to light.  Cardiovascular:     Rate and Rhythm: Normal rate and regular rhythm.     Pulses:          Dorsalis pedis pulses are detected w/ Doppler on the right side and detected w/ Doppler on the left side.  Pulmonary:     Effort: Pulmonary effort is normal.     Breath sounds: Normal breath sounds.  Abdominal:     General: Abdomen is flat. Bowel sounds are normal.     Palpations: Abdomen is soft.  Musculoskeletal:        General: Normal range of motion.     Cervical back: Normal range of motion and neck supple.       Feet:  Feet:     Right foot:     Skin integrity: Ulcer present.     Left foot:     Skin integrity: Skin integrity normal.     Toenail Condition: Left toenails are abnormally thick.  Skin:    General: Skin is warm and dry.  Neurological:     General: No focal deficit present.     Mental Status: She is alert and oriented to person, place, and time.  Psychiatric:        Mood and Affect: Mood normal.        Behavior: Behavior normal.     Assessment/Plan: Patient with PAD and right great toe chronic infection/Osteomyelitis I will schedule her for aortogram with runoff of the right lower limb.  Elmore Guise 02/15/2021, 11:20 AM

## 2021-02-16 ENCOUNTER — Other Ambulatory Visit (INDEPENDENT_AMBULATORY_CARE_PROVIDER_SITE_OTHER): Payer: Self-pay | Admitting: Vascular Surgery

## 2021-02-16 DIAGNOSIS — M869 Osteomyelitis, unspecified: Secondary | ICD-10-CM | POA: Diagnosis not present

## 2021-02-16 DIAGNOSIS — I10 Essential (primary) hypertension: Secondary | ICD-10-CM | POA: Diagnosis not present

## 2021-02-16 LAB — CREATININE, SERUM
Creatinine, Ser: 0.74 mg/dL (ref 0.44–1.00)
GFR, Estimated: >60 mL/min (ref >60)

## 2021-02-16 MED ORDER — ROSUVASTATIN CALCIUM 20 MG PO TABS
20.0000 mg | ORAL_TABLET | Freq: Every day | ORAL | Status: DC
  Administered 2021-02-16 – 2021-02-18 (×3): 20 mg via ORAL
  Filled 2021-02-16 (×3): qty 1

## 2021-02-16 MED ORDER — HYDRALAZINE HCL 50 MG PO TABS
25.0000 mg | ORAL_TABLET | Freq: Four times a day (QID) | ORAL | Status: DC | PRN
  Administered 2021-02-17: 12:00:00 25 mg via ORAL
  Filled 2021-02-16: qty 1

## 2021-02-16 MED ORDER — SODIUM CHLORIDE 0.9 % IV SOLN: INTRAVENOUS | Status: DC

## 2021-02-16 NOTE — Progress Notes (Signed)
PODIATRY / FOOT AND ANKLE SURGERY PROGRESS NOTE  Requesting Physician: Dr. Waldron Labs  Reason for consult: Right toe infection   HPI: Jackie Macias is a 65 y.o. female who presents resting in bedside chair today comfortably eating lunch.  Patient denies any complaints at this time.  Patient is awaiting angiogram at some point in the next day prior to potential surgical intervention for right toe infection.  Patient currently denies nausea, vomiting, fever, chills.  PMHx:  Past Medical History:  Diagnosis Date   Arthritis    Bronchitis    COPD (chronic obstructive pulmonary disease) (HCC)    GERD (gastroesophageal reflux disease)    Gout    Hypertension     Surgical Hx:  Past Surgical History:  Procedure Laterality Date   AMPUTATION Right 07/29/2017   Procedure: AMPUTATION DIGIT/ 82500;  Surgeon: Sharlotte Alamo, DPM;  Location: [facility];  Service: Podiatry;  Laterality: Right;   CHOLECYSTECTOMY     facial biopsy Right    JOINT REPLACEMENT Right 12/2016   THR   LOWER EXTREMITY ANGIOGRAPHY Right 09/27/2017   Procedure: LOWER EXTREMITY ANGIOGRAPHY;  Surgeon: Katha Cabal, MD;  Location: [facility];  Service: Cardiovascular;  Laterality: Right;   LOWER EXTREMITY ANGIOGRAPHY Right 06/17/2020   Procedure: LOWER EXTREMITY ANGIOGRAPHY;  Surgeon: Katha Cabal, MD;  Location: [facility];  Service: Cardiovascular;  Laterality: Right;   TONSILLECTOMY     TOTAL HIP ARTHROPLASTY Right 01/17/2017   Procedure: TOTAL HIP ARTHROPLASTY ANTERIOR APPROACH;  Surgeon: Lovell Sheehan, MD;  Location: [facility];  Service: Orthopedics;  Laterality: Right;    FHx:  Family History  Problem Relation Age of Onset   Breast cancer Sister 22   Breast cancer Other     Social History:  reports that she has been smoking cigarettes. She has a 25.00 pack-year smoking history. She has never used smokeless tobacco. She reports current alcohol use of about 3.0 standard drinks per week. She  reports that she does not use drugs.  Allergies:  Allergies  Allergen Reactions   Lisinopril-Hydrochlorothiazide Other (See Comments)   Medications Prior to Admission  Medication Sig Dispense Refill   Acetaminophen 500 MG capsule Take 1-2 tablets by mouth every 6 (six) hours as needed.     albuterol (VENTOLIN HFA) 108 (90 Base) MCG/ACT inhaler Inhale 2 puffs into the lungs every 6 (six) hours as needed for wheezing or shortness of breath.     amLODipine (NORVASC) 5 MG tablet Take 5 mg by mouth daily.     amoxicillin-clavulanate (AUGMENTIN) 875-125 MG tablet Take 1 tablet by mouth 2 (two) times daily.     aspirin EC 81 MG tablet Take 1 tablet (81 mg total) by mouth daily. 150 tablet 2   cetirizine (ZYRTEC) 5 MG tablet Take 5 mg by mouth daily.     clopidogrel (PLAVIX) 75 MG tablet Take 1 tablet (75 mg total) by mouth daily. 30 tablet 11   gabapentin (NEURONTIN) 300 MG capsule Take 1-2 capsules by mouth 2 (two) times daily. 1 capsule qam  2 capsules qhs     ipratropium-albuterol (DUONEB) 0.5-2.5 (3) MG/3ML SOLN Take 3 mLs by nebulization every 4 (four) hours as needed.     Iron-FA-B Cmp-C-Biot-Probiotic (FUSION PLUS) CAPS Take 1 tablet by mouth daily with breakfast.     naproxen sodium (ALEVE) 220 MG tablet Take 220 mg by mouth daily as needed (Pain).     pantoprazole (PROTONIX) 40 MG tablet Take 40 mg by  mouth every morning.     rosuvastatin (CRESTOR) 20 MG tablet Take 20 mg by mouth daily.     HYDROcodone-acetaminophen (NORCO) 5-325 MG tablet Take 1-2 tablets by mouth every 6 (six) hours as needed. (Patient not taking: Reported on 07/28/2020) 50 tablet 0     Physical Exam: General: Alert and oriented.  No apparent distress.  Vascular: DP/PT pulses nonpalpable bilateral.  Capillary fill time appears to be delayed to the right hallux.  No hair growth noted to digits bilateral.  Right hallux appears to be edematous and erythematous, no proximal streaking noted.  Neuro: Light touch sensation  slightly diminished to bilateral lower extremities.  Derm: Ulceration noted to the distal lateral aspect of the incision line where the patient had a partial hallux amputation right foot, wound measures approximately 0.3 x 0.2 x 0.6 cm and does probe to bone which does produce pain for patient.  Serous drainage noted from the wound site, associated erythema and edema present to the area but not extending proximal to the metatarsal phalangeal joint.  Skin appears to be somewhat thin and atrophic to both feet.    MSK: Right partial hallux amputation  Results for orders placed or performed during the hospital encounter of 02/14/21 (from the past 48 hour(s))  Comprehensive metabolic panel     Status: Abnormal   Collection Time: 02/14/21  2:07 PM  Result Value Ref Range   Sodium 139 135 - 145 mmol/L   Potassium 3.6 3.5 - 5.1 mmol/L   Chloride 107 98 - 111 mmol/L   CO2 27 22 - 32 mmol/L   Glucose, Bld 103 (H) 70 - 99 mg/dL    Comment: Glucose reference range applies only to samples taken after fasting for at least 8 hours.   BUN 13 8 - 23 mg/dL   Creatinine, Ser 0.63 0.44 - 1.00 mg/dL   Calcium 9.2 8.9 - 10.3 mg/dL   Total Protein 7.7 6.5 - 8.1 g/dL   Albumin 3.5 3.5 - 5.0 g/dL   AST 16 15 - 41 U/L   ALT 9 0 - 44 U/L   Alkaline Phosphatase 56 38 - 126 U/L   Total Bilirubin 0.6 0.3 - 1.2 mg/dL   GFR, Estimated >60 >60 mL/min    Comment: (NOTE) Calculated using the CKD-EPI Creatinine Equation (2021)    Anion gap 5 5 - 15    Comment: Performed at [facility], [address] [place], [place] [address]  Lactic acid, plasma     Status: None   Collection Time: 02/14/21  2:07 PM  Result Value Ref Range   Lactic Acid, Venous 0.8 0.5 - 1.9 mmol/L    Comment: Performed at [facility], [address] [place], [place] [address]  CBC with Differential     Status: Abnormal   Collection Time: 02/14/21  2:07 PM  Result Value Ref Range   WBC 5.3 4.0 - 10.5 K/uL   RBC 3.30  (L) 3.87 - 5.11 MIL/uL   Hemoglobin 10.7 (L) 12.0 - 15.0 g/dL   HCT 33.0 (L) 36.0 - 46.0 %   MCV 100.0 80.0 - 100.0 fL   MCH 32.4 26.0 - 34.0 pg   MCHC 32.4 30.0 - 36.0 g/dL   RDW 14.2 11.5 - 15.5 %   Platelets 340 150 - 400 K/uL   nRBC 0.0 0.0 - 0.2 %   Neutrophils Relative % 51 %   Neutro Abs 2.6 1.7 - 7.7 K/uL   Lymphocytes Relative 38 %  Lymphs Abs 2.0 0.7 - 4.0 K/uL   Monocytes Relative 10 %   Monocytes Absolute 0.6 0.1 - 1.0 K/uL   Eosinophils Relative 1 %   Eosinophils Absolute 0.1 0.0 - 0.5 K/uL   Basophils Relative 0 %   Basophils Absolute 0.0 0.0 - 0.1 K/uL   Immature Granulocytes 0 %   Abs Immature Granulocytes 0.01 0.00 - 0.07 K/uL    Comment: Performed at [facility], [address] [place], [place] [address]  ESR     Status: Abnormal   Collection Time: 02/14/21  2:07 PM  Result Value Ref Range   Sed Rate 60 (H) 0 - 30 mm/hr    Comment: Performed at [facility], [address], [place], [place] [address]  Resp Panel by RT-PCR (Flu A&B, Covid) Nasopharyngeal Swab     Status: None   Collection Time: 02/14/21  5:39 PM   Specimen: Nasopharyngeal Swab; Nasopharyngeal(NP) swabs in vial transport medium  Result Value Ref Range   SARS Coronavirus 2 by RT PCR NEGATIVE NEGATIVE    Comment: (NOTE) SARS-CoV-2 target nucleic acids are NOT DETECTED.  The SARS-CoV-2 RNA is generally detectable in upper respiratory specimens during the acute phase of infection. The lowest concentration of SARS-CoV-2 viral copies this assay can detect is 138 copies/mL. A negative result does not preclude SARS-Cov-2 infection and should not be used as the sole basis for treatment or other patient management decisions. A negative result may occur with  improper specimen collection/handling, submission of specimen other than nasopharyngeal swab, presence of viral mutation(s) within the areas targeted by this assay, and inadequate number of viral copies(<138 copies/mL). A  negative result must be combined with clinical observations, patient history, and epidemiological information. The expected result is Negative.  Fact Sheet for Patients:  EntrepreneurPulse.com.au  Fact Sheet for Healthcare Providers:  IncredibleEmployment.be  This test is no t yet approved or cleared by the [place] FDA and  has been authorized for detection and/or diagnosis of SARS-CoV-2 by FDA under an Emergency Use Authorization (EUA). This EUA will remain  in effect (meaning this test can be used) for the duration of the COVID-19 declaration under Section 564(b)(1) of the Act, 21 U.S.C.section 360bbb-3(b)(1), unless the authorization is terminated  or revoked sooner.       Influenza A by PCR NEGATIVE NEGATIVE   Influenza B by PCR NEGATIVE NEGATIVE    Comment: (NOTE) The Xpert Xpress SARS-CoV-2/FLU/RSV plus assay is intended as an aid in the diagnosis of influenza from Nasopharyngeal swab specimens and should not be used as a sole basis for treatment. Nasal washings and aspirates are unacceptable for Xpert Xpress SARS-CoV-2/FLU/RSV testing.  Fact Sheet for Patients: EntrepreneurPulse.com.au  Fact Sheet for Healthcare Providers: IncredibleEmployment.be  This test is not yet approved or cleared by the [place] FDA and has been authorized for detection and/or diagnosis of SARS-CoV-2 by FDA under an Emergency Use Authorization (EUA). This EUA will remain in effect (meaning this test can be used) for the duration of the COVID-19 declaration under Section 564(b)(1) of the Act, 21 U.S.C. section 360bbb-3(b)(1), unless the authorization is terminated or revoked.  Performed at [facility], [address] [place], [place] [address]   Blood culture (routine x 2)     Status: None (Preliminary result)   Collection Time: 02/14/21  9:09 PM   Specimen: BLOOD  Result Value Ref Range    Specimen Description BLOOD LEFT ANTECUBITAL    Special Requests  BOTTLES DRAWN AEROBIC AND ANAEROBIC Blood Culture adequate volume   Culture      NO GROWTH 2 DAYS Performed at [facility], [address] [place], [place] [address]    Report Status PENDING   Blood culture (routine x 2)     Status: None (Preliminary result)   Collection Time: 02/14/21  9:19 PM   Specimen: BLOOD  Result Value Ref Range   Specimen Description BLOOD BLOOD LEFT HAND    Special Requests      BOTTLES DRAWN AEROBIC AND ANAEROBIC Blood Culture adequate volume   Culture      NO GROWTH 2 DAYS Performed at [facility], [address], [place], [place] [address]    Report Status PENDING   C-reactive protein     Status: Abnormal   Collection Time: 02/14/21  9:19 PM  Result Value Ref Range   CRP 1.1 (H) <1.0 mg/dL    Comment: Performed at [facility], [place] [address], [place], [place] [address]  HIV Antibody (routine testing w rflx)     Status: None   Collection Time: 02/14/21  9:19 PM  Result Value Ref Range   HIV Screen 4th Generation wRfx Non Reactive Non Reactive    Comment: Performed at [facility], [place] [address] [place], [place] [address]  CBC with Differential/Platelet     Status: Abnormal   Collection Time: 02/15/21  6:01 AM  Result Value Ref Range   WBC 4.5 4.0 - 10.5 K/uL   RBC 3.26 (L) 3.87 - 5.11 MIL/uL   Hemoglobin 10.3 (L) 12.0 - 15.0 g/dL   HCT 32.4 (L) 36.0 - 46.0 %   MCV 99.4 80.0 - 100.0 fL   MCH 31.6 26.0 - 34.0 pg   MCHC 31.8 30.0 - 36.0 g/dL   RDW 14.0 11.5 - 15.5 %   Platelets 286 150 - 400 K/uL   nRBC 0.0 0.0 - 0.2 %   Neutrophils Relative % 45 %   Neutro Abs 2.0 1.7 - 7.7 K/uL   Lymphocytes Relative 41 %   Lymphs Abs 1.8 0.7 - 4.0 K/uL   Monocytes Relative 12 %   Monocytes Absolute 0.6 0.1 - 1.0 K/uL   Eosinophils Relative 2 %   Eosinophils Absolute 0.1 0.0 - 0.5 K/uL   Basophils Relative 0 %   Basophils Absolute 0.0 0.0 - 0.1 K/uL    Immature Granulocytes 0 %   Abs Immature Granulocytes 0.01 0.00 - 0.07 K/uL    Comment: Performed at [facility], [address] [place],  [address]  Comprehensive metabolic panel     Status: Abnormal   Collection Time: 02/15/21  6:01 AM  Result Value Ref Range   Sodium 136 135 - 145 mmol/L   Potassium 3.7 3.5 - 5.1 mmol/L   Chloride 106 98 - 111 mmol/L   CO2 25 22 - 32 mmol/L   Glucose, Bld 121 (H) 70 - 99 mg/dL    Comment: Glucose reference range applies only to samples taken after fasting for at least 8 hours.   BUN 13 8 - 23 mg/dL   Creatinine, Ser 0.65 0.44 - 1.00 mg/dL   Calcium 8.8 (L) 8.9 - 10.3 mg/dL   Total Protein 6.6 6.5 - 8.1 g/dL   Albumin 3.0 (L) 3.5 - 5.0 g/dL   AST 15 15 - 41 U/L   ALT 8 0 - 44 U/L   Alkaline Phosphatase 49 38 - 126 U/L   Total  Bilirubin 0.5 0.3 - 1.2 mg/dL   GFR, Estimated >60 >60 mL/min    Comment: (NOTE) Calculated using the CKD-EPI Creatinine Equation (2021)    Anion gap 5 5 - 15    Comment: Performed at [facility], [address] [place], [place] [address]  Aerobic/Anaerobic Culture w Gram Stain (surgical/deep wound)     Status: None (Preliminary result)   Collection Time: 02/15/21 10:59 AM   Specimen: Toe; Wound  Result Value Ref Range   Specimen Description      TOE LEFT Performed at [facility], [address] [place], [place] [address]    Special Requests      NONE Performed at [facility], [place], [place] [address]    Gram Stain      NO SQUAMOUS EPITHELIAL CELLS SEEN FEW WBC SEEN NO ORGANISMS SEEN    Culture      RARE STAPHYLOCOCCUS SIMULANS SUSCEPTIBILITIES TO FOLLOW Performed at [facility], [place] [address], [place], [place] [address]    Report Status PENDING   Creatinine, serum     Status: None   Collection Time: 02/16/21  5:13 AM  Result Value Ref Range   Creatinine, Ser 0.74 0.44 - 1.00 mg/dL   GFR, Estimated >60 >60 mL/min     Comment: (NOTE) Calculated using the CKD-EPI Creatinine Equation (2021) Performed at [facility], [address] [place], [place] [address]    MR TOES RIGHT W WO CONTRAST  Result Date: 02/14/2021 CLINICAL DATA:  Osteomyelitis. History of partial great toe amputation EXAM: MRI OF THE RIGHT TOES WITHOUT AND WITH CONTRAST TECHNIQUE: Multiplanar, multisequence MR imaging of the right forefoot was performed both before and after administration of intravenous contrast. CONTRAST:  41m GADAVIST GADOBUTROL 1 MMOL/ML IV SOLN COMPARISON:  X-ray 02/14/2021 FINDINGS: Bones/Joint/Cartilage Status post resection of the great toe distal phalanx. There is a large erosion involving the distal aspect of the great toe proximal phalanx with marked bone marrow edema and confluent low T1 signal throughout the proximal phalanx compatible with acute osteomyelitis. Preservation of the bone marrow signal within the first metatarsal and hallux sesamoids. Remaining osseous structures of the forefoot are intact. No additional sites of erosion or marrow replacement. No fracture or dislocation. Ligaments Intact Lisfranc ligament. Collateral ligaments of the forefoot are intact. Muscles and Tendons No acute musculotendinous abnormality. Soft tissues Ulceration at the distal aspect of the great toe with small underlying rim enhancing fluid collection measuring up to 14 mm. Collection partially extends into the medullary space of the distal phalanx. Prominent surrounding soft tissue edema and enhancement compatible with cellulitis. IMPRESSION: Ulceration at the distal aspect of the great toe with acute osteomyelitis of the great toe proximal phalanx. Small abscess which partially extends intraosseously within the proximal phalanx. Electronically Signed   By: NDavina PokeD.O.   On: 02/14/2021 20:02   DG Foot Complete Right  Result Date: 02/14/2021 CLINICAL DATA:  Pain. Osteomyelitis. Drainage. Wound infection a right  great toe. History of amputation of great toe. EXAM: RIGHT FOOT COMPLETE - 3+ VIEW COMPARISON:  None. FINDINGS: The patient is status post partial amputation of the great toe. There is lucency in the distal aspect of the remaining proximal phalanx. IMPRESSION: 1. The patient is status post partial amputation of the great toe. There is lucency in the distal aspect of the remaining proximal phalanx. While it is possible the deformity of the remaining proximal phalanx could be due to previous amputation,  the irregularity and lucency is most suggestive of osteomyelitis. If the clinical picture is ambiguous, MRI could better evaluate. Electronically Signed   By: Dorise Bullion III M.D.   On: 02/14/2021 15:08    Blood pressure (!) 189/78, pulse 60, temperature 97.8 F (36.6 C), temperature source Oral, resp. rate 19, height _0  (1.676 m), weight 103.9 kg, SpO2 100 %.  Assessment Right hallux osteomyelitis with associated gangrene PVD  Plan -Patient seen and examined. -X-ray imaging and MRI imaging reviewed and discussed with patient detail showing osteomyelitis present to the remaining portion of the right hallux.  Does not extend past the base of the proximal phalanx, first metatarsal head appears to be free of infection.  Possible abscess also noted distally. -Clinically foot appears to be fairly stable at this time, labs and vitals also appear to be stable. -Wound culture pending, growing staph bacteria. -Recommend Betadine wet-to-dry dressings to be placed daily.  Order placed.  Order placed also for surgical shoe right foot. -Appreciate vascular recommendations.  Will await vascular recommendations prior to revision right hallux amputation.  Planning for angiogram tomorrow. -All treatment options were discussed with patient both conservative and surgical attempts at correction include potential risks and complications at this time patient is elected for surgical intervention consisting of revision  right hallux amputation versus partial first ray amputation.  All questions answered including postoperative course.  No guarantees given.  Consent obtained.  Orders placed in chart. -Plan for surgery on Wednesday, 02/18/2021 with Dr. Vickki Muff around 4 PM.  Patient is to be n.p.o. at 730 that morning and may have an early breakfast.  Caroline More, DPM 02/16/2021, 1:19 PM

## 2021-02-16 NOTE — Progress Notes (Signed)
PROGRESS NOTE    Jackie Macias  RDE:[id number] DOB: 1955-10-20 DOA: 02/14/2021 PCP: Lavera Guise, MD    Chief Complaint  Patient presents with   Wound Infection    Brief Narrative:  65 year old African-American female with history of peripheral vascular disease status right 1st toe amputation several years ago, hypertension, reflux, tobacco abuse disorder presents to the ER today with after being sent to the ER from the podiatry office.  Patient was seen in podiatry office, started on oral Augmentin, sent to ED for further evaluation, her MRI significant for acute osteomyelitis and abscess, she is admitted for further evaluation.    Assessment & Plan:   Principal Problem:   Toe osteomyelitis, right (HCC) Active Problems:   Essential hypertension   PAD (peripheral artery disease) (HCC)   GERD (gastroesophageal reflux disease)   Tobacco use disorder   Hyperlipidemia    Acute osteomyelitis of right hallux with abscess . -Continue with broad-spectrum antibiotic, IV vancomycin and Zosyn for now -vascular surgery and podiatry input greatly appreciated, plan for angiogram today, with possible intervention, and intervention by podiatry, either revision hallux amputation versus partial first ray amputation of right foot.     PAD (peripheral artery disease) ([place]) Continue with aspirin and Plavix   Essential hypertension Continue with Norvasc, blood pressure intermittently elevated, will add as needed hydralazine   GERD (gastroesophageal reflux disease) Stable   Tobacco use disorder Patient continues to smoke.  Nicotine patch will be ordered.  She has no intention of quitting.   Hyperlipidemia Continue simvastatin.   DVT prophylaxis: lovenox Code Status: Full Family Communication: none at bedside  Status is: Observation  The patient will require care spanning > 2 midnights and should be moved to inpatient because: Patient has acute osteomyelitis with abscess, in need of  angiogram, IV antibiotics, and possible amputation.  N.      Consultants:  Podiatry Vascular surgery   Subjective: She denies any complaints today  Objective: Vitals:   02/16/21 0500 02/16/21 0557 02/16/21 0655 02/16/21 0819  BP:  (!) 188/77 (!) 143/61 (!) 189/78  Pulse:  68 (!) 57 60  Resp:  18  19  Temp:  98 F (36.7 C)  97.8 F (36.6 C)  TempSrc:  Oral    SpO2:  97%  100%  Weight: 103.9 kg     Height:        Intake/Output Summary (Last 24 hours) at 02/16/2021 0938 Last data filed at 02/15/2021 1900 Gross per 24 hour  Intake 240 ml  Output --  Net 240 ml   Filed Weights   02/14/21 1324 02/16/21 0500  Weight: 101.6 kg 103.9 kg    Examination:  Awake Alert, Oriented X 3, No new F.N deficits, Normal affect Symmetrical Chest wall movement, Good air movement bilaterally, CTAB RRR,No Gallops,Rubs or new Murmurs, No Parasternal Heave +ve B.Sounds, Abd Soft, No tenderness, No rebound - guarding or rigidity. Right great toe with previous partial toe amputation, has small ulcer  present    Data Reviewed: I have personally reviewed following labs and imaging studies  CBC: Recent Labs  Lab 02/14/21 1407 02/15/21 0601  WBC 5.3 4.5  NEUTROABS 2.6 2.0  HGB 10.7* 10.3*  HCT 33.0* 32.4*  MCV 100.0 99.4  PLT 340 631    Basic Metabolic Panel: Recent Labs  Lab 02/14/21 1407 02/15/21 0601 02/16/21 0513  NA 139 136  --   K 3.6 3.7  --   CL 107 106  --   CO2  27 25  --   GLUCOSE 103* 121*  --   BUN 13 13  --   CREATININE 0.63 0.65 0.74  CALCIUM 9.2 8.8*  --     GFR: Estimated Creatinine Clearance: 84.2 mL/min (by C-G formula based on SCr of 0.74 mg/dL).  Liver Function Tests: Recent Labs  Lab 02/14/21 1407 02/15/21 0601  AST 16 15  ALT 9 8  ALKPHOS 56 49  BILITOT 0.6 0.5  PROT 7.7 6.6  ALBUMIN 3.5 3.0*    CBG: No results for input(s): GLUCAP in the last 168 hours.   Recent Results (from the past 240 hour(s))  Resp Panel by RT-PCR (Flu  A&B, Covid) Nasopharyngeal Swab     Status: None   Collection Time: 02/14/21  5:39 PM   Specimen: Nasopharyngeal Swab; Nasopharyngeal(NP) swabs in vial transport medium  Result Value Ref Range Status   SARS Coronavirus 2 by RT PCR NEGATIVE NEGATIVE Final    Comment: (NOTE) SARS-CoV-2 target nucleic acids are NOT DETECTED.  The SARS-CoV-2 RNA is generally detectable in upper respiratory specimens during the acute phase of infection. The lowest concentration of SARS-CoV-2 viral copies this assay can detect is 138 copies/mL. A negative result does not preclude SARS-Cov-2 infection and should not be used as the sole basis for treatment or other patient management decisions. A negative result may occur with  improper specimen collection/handling, submission of specimen other than nasopharyngeal swab, presence of viral mutation(s) within the areas targeted by this assay, and inadequate number of viral copies(<138 copies/mL). A negative result must be combined with clinical observations, patient history, and epidemiological information. The expected result is Negative.  Fact Sheet for Patients:  EntrepreneurPulse.com.au  Fact Sheet for Healthcare Providers:  IncredibleEmployment.be  This test is no t yet approved or cleared by the [place] FDA and  has been authorized for detection and/or diagnosis of SARS-CoV-2 by FDA under an Emergency Use Authorization (EUA). This EUA will remain  in effect (meaning this test can be used) for the duration of the COVID-19 declaration under Section 564(b)(1) of the Act, 21 U.S.C.section 360bbb-3(b)(1), unless the authorization is terminated  or revoked sooner.       Influenza A by PCR NEGATIVE NEGATIVE Final   Influenza B by PCR NEGATIVE NEGATIVE Final    Comment: (NOTE) The Xpert Xpress SARS-CoV-2/FLU/RSV plus assay is intended as an aid in the diagnosis of influenza from Nasopharyngeal swab specimens  and should not be used as a sole basis for treatment. Nasal washings and aspirates are unacceptable for Xpert Xpress SARS-CoV-2/FLU/RSV testing.  Fact Sheet for Patients: EntrepreneurPulse.com.au  Fact Sheet for Healthcare Providers: IncredibleEmployment.be  This test is not yet approved or cleared by the [place] FDA and has been authorized for detection and/or diagnosis of SARS-CoV-2 by FDA under an Emergency Use Authorization (EUA). This EUA will remain in effect (meaning this test can be used) for the duration of the COVID-19 declaration under Section 564(b)(1) of the Act, 21 U.S.C. section 360bbb-3(b)(1), unless the authorization is terminated or revoked.  Performed at [facility], [address] [place], [place] [address]   Blood culture (routine x 2)     Status: None (Preliminary result)   Collection Time: 02/14/21  9:09 PM   Specimen: BLOOD  Result Value Ref Range Status   Specimen Description BLOOD LEFT ANTECUBITAL  Final   Special Requests   Final    BOTTLES DRAWN AEROBIC AND ANAEROBIC Blood Culture adequate volume   Culture   Final  NO GROWTH < 12 HOURS Performed at [facility], [address] [place], [place] [address]    Report Status PENDING  Incomplete  Blood culture (routine x 2)     Status: None (Preliminary result)   Collection Time: 02/14/21  9:19 PM   Specimen: BLOOD  Result Value Ref Range Status   Specimen Description BLOOD BLOOD LEFT HAND  Final   Special Requests   Final    BOTTLES DRAWN AEROBIC AND ANAEROBIC Blood Culture adequate volume   Culture   Final    NO GROWTH < 12 HOURS Performed at [facility], [address], [place], [place] [address]    Report Status PENDING  Incomplete  Aerobic/Anaerobic Culture w Gram Stain (surgical/deep wound)     Status: None (Preliminary result)   Collection Time: 02/15/21 10:59 AM   Specimen: Toe; Wound  Result Value Ref Range  Status   Specimen Description   Final    TOE LEFT Performed at [facility], [address], [place], [place] [address]    Special Requests   Final    NONE Performed at [facility], [address], [place], [place] [address]    Gram Stain   Final    NO SQUAMOUS EPITHELIAL CELLS SEEN FEW WBC SEEN NO ORGANISMS SEEN Performed at [facility], [place] [address], [place], [place] [address]    Culture PENDING  Incomplete   Report Status PENDING  Incomplete         Radiology Studies: MR TOES RIGHT W WO CONTRAST  Result Date: 02/14/2021 CLINICAL DATA:  Osteomyelitis. History of partial great toe amputation EXAM: MRI OF THE RIGHT TOES WITHOUT AND WITH CONTRAST TECHNIQUE: Multiplanar, multisequence MR imaging of the right forefoot was performed both before and after administration of intravenous contrast. CONTRAST:  48mL GADAVIST GADOBUTROL 1 MMOL/ML IV SOLN COMPARISON:  X-ray 02/14/2021 FINDINGS: Bones/Joint/Cartilage Status post resection of the great toe distal phalanx. There is a large erosion involving the distal aspect of the great toe proximal phalanx with marked bone marrow edema and confluent low T1 signal throughout the proximal phalanx compatible with acute osteomyelitis. Preservation of the bone marrow signal within the first metatarsal and hallux sesamoids. Remaining osseous structures of the forefoot are intact. No additional sites of erosion or marrow replacement. No fracture or dislocation. Ligaments Intact Lisfranc ligament. Collateral ligaments of the forefoot are intact. Muscles and Tendons No acute musculotendinous abnormality. Soft tissues Ulceration at the distal aspect of the great toe with small underlying rim enhancing fluid collection measuring up to 14 mm. Collection partially extends into the medullary space of the distal phalanx. Prominent surrounding soft tissue edema and enhancement compatible with cellulitis. IMPRESSION: Ulceration at the  distal aspect of the great toe with acute osteomyelitis of the great toe proximal phalanx. Small abscess which partially extends intraosseously within the proximal phalanx. Electronically Signed   By: Davina Poke D.O.   On: 02/14/2021 20:02   DG Foot Complete Right  Result Date: 02/14/2021 CLINICAL DATA:  Pain. Osteomyelitis. Drainage. Wound infection a right great toe. History of amputation of great toe. EXAM: RIGHT FOOT COMPLETE - 3+ VIEW COMPARISON:  None. FINDINGS: The patient is status post partial amputation of the great toe. There is lucency in the distal aspect of the remaining proximal phalanx. IMPRESSION: 1. The patient is status post partial amputation of the great toe. There is lucency in the distal aspect of the remaining proximal phalanx. While it is possible the deformity  of the remaining proximal phalanx could be due to previous amputation, the irregularity and lucency is most suggestive of osteomyelitis. If the clinical picture is ambiguous, MRI could better evaluate. Electronically Signed   By: Dorise Bullion III M.D.   On: 02/14/2021 15:08        Scheduled Meds:  amLODipine  5 mg Oral Daily   aspirin EC  81 mg Oral Daily   clopidogrel  75 mg Oral Daily   enoxaparin (LOVENOX) injection  0.5 mg/kg Subcutaneous Q24H   nicotine  14 mg Transdermal Daily   rosuvastatin  20 mg Oral Daily   Continuous Infusions:  sodium chloride 10 mL/hr at 02/15/21 1857   piperacillin-tazobactam (ZOSYN)  IV 3.375 g (02/16/21 0318)   vancomycin 1,500 mg (02/15/21 1901)     LOS: 1 day        Phillips Climes, MD [facility]   To contact the attending provider between 7A-7P or the covering provider during after hours 7P-7A, please log into the web site [email or web address] and access using universal [place] password for that web site. If you do not have the password, please call the hospital operator.  02/16/2021, 9:38 AM

## 2021-02-16 NOTE — Progress Notes (Signed)
Attempted to have patient sign consent form for aortogram. Patient refused to sign until she talked to MD. MD Dew notified.

## 2021-02-17 ENCOUNTER — Encounter
Admission: EM | Disposition: A | Discharge status: Home Health | Payer: Self-pay | Source: Home / Self Care | Attending: Internal Medicine

## 2021-02-17 DIAGNOSIS — M869 Osteomyelitis, unspecified: Secondary | ICD-10-CM | POA: Diagnosis not present

## 2021-02-17 DIAGNOSIS — I10 Essential (primary) hypertension: Secondary | ICD-10-CM | POA: Diagnosis not present

## 2021-02-17 LAB — CBC
HCT: 31.7 % — ABNORMAL LOW (ref 36.0–46.0)
Hemoglobin: 10.6 g/dL — ABNORMAL LOW (ref 12.0–15.0)
MCH: 32.6 pg (ref 26.0–34.0)
MCHC: 33.4 g/dL (ref 30.0–36.0)
MCV: 97.5 fL (ref 80.0–100.0)
Platelets: 308 10*3/uL (ref 150–400)
RBC: 3.25 MIL/uL — ABNORMAL LOW (ref 3.87–5.11)
RDW: 13.8 % (ref 11.5–15.5)
WBC: 5.3 10*3/uL (ref 4.0–10.5)
nRBC: 0 % (ref 0.0–0.2)

## 2021-02-17 LAB — GLUCOSE, CAPILLARY
Glucose-Capillary: 108 mg/dL — ABNORMAL HIGH (ref 70–99)
Glucose-Capillary: 78 mg/dL (ref 70–99)
Glucose-Capillary: 94 mg/dL (ref 70–99)

## 2021-02-17 LAB — BASIC METABOLIC PANEL
Anion gap: 8 (ref 5–15)
BUN: 15 mg/dL (ref 8–23)
CO2: 22 mmol/L (ref 22–32)
Calcium: 9 mg/dL (ref 8.9–10.3)
Chloride: 105 mmol/L (ref 98–111)
Creatinine, Ser: 0.61 mg/dL (ref 0.44–1.00)
GFR, Estimated: >60 mL/min (ref >60)
Glucose, Bld: 111 mg/dL — ABNORMAL HIGH (ref 70–99)
Potassium: 4 mmol/L (ref 3.5–5.1)
Sodium: 135 mmol/L (ref 135–145)

## 2021-02-17 SURGERY — LOWER EXTREMITY ANGIOGRAPHY
Anesthesia: Moderate Sedation | Laterality: Right

## 2021-02-17 MED ORDER — DIPHENHYDRAMINE HCL 50 MG/ML IJ SOLN: 50.0000 mg | Freq: Once | INTRAMUSCULAR | Status: DC | PRN

## 2021-02-17 MED ORDER — SODIUM CHLORIDE 0.9 % IV SOLN: INTRAVENOUS | Status: DC

## 2021-02-17 MED ORDER — POVIDONE-IODINE 10 % EX SWAB
2.0000 "application " | Freq: Once | CUTANEOUS | Status: DC
  Administered 2021-02-18: 2 via TOPICAL

## 2021-02-17 MED ORDER — FAMOTIDINE 20 MG PO TABS: 40.0000 mg | ORAL_TABLET | Freq: Once | ORAL | Status: DC | PRN

## 2021-02-17 MED ORDER — CHLORHEXIDINE GLUCONATE 4 % EX LIQD
60.0000 mL | Freq: Once | CUTANEOUS | Status: DC
  Administered 2021-02-18: 4 via TOPICAL

## 2021-02-17 MED ORDER — CEFAZOLIN SODIUM-DEXTROSE 1-4 GM/50ML-% IV SOLN
1.0000 g | Freq: Three times a day (TID) | INTRAVENOUS | Status: DC
  Administered 2021-02-17 – 2021-02-19 (×6): 1 g via INTRAVENOUS
  Filled 2021-02-17 (×11): qty 50

## 2021-02-17 MED ORDER — METHYLPREDNISOLONE SODIUM SUCC 125 MG IJ SOLR: 125.0000 mg | Freq: Once | INTRAMUSCULAR | Status: DC | PRN

## 2021-02-17 MED ORDER — CEFAZOLIN SODIUM-DEXTROSE 2-4 GM/100ML-% IV SOLN
2.0000 g | Freq: Once | INTRAVENOUS | Status: AC
  Filled 2021-02-17: qty 100

## 2021-02-17 NOTE — Progress Notes (Signed)
Patient seen and evaluated.  Resting comfortably.  Angio is scheduled for today.  Patient has osteomyelitis both on x-ray and MRI of the right great toe.  Has undergone previous distal great toe imitation.  Residual proximal phalanx is infected.  Plan is to perform an amputation of the great toe to the level of the joint with possible removal of further bone to assist with primary wound closure.  I had long discussion with the patient in regards to the surgical intervention that is planned.  The risk benefits alternatives and complications associated with surgery were discussed with the patient full and consent has been given.We will plan for tomorrow afternoon.  Orders have been placed.

## 2021-02-17 NOTE — Progress Notes (Signed)
PROGRESS NOTE    Jackie Macias  XBD:[id number] DOB: 1955-12-14 DOA: 02/14/2021 PCP: Lavera Guise, MD    Chief Complaint  Patient presents with   Wound Infection    Brief Narrative:  65 year old African-American female with history of peripheral vascular disease status right 1st toe amputation several years ago, hypertension, reflux, tobacco abuse disorder presents to the ER today with after being sent to the ER from the podiatry office.  Patient was seen in podiatry office, started on oral Augmentin, sent to ED for further evaluation, her MRI significant for acute osteomyelitis and abscess, she is admitted for further evaluation.    Assessment & Plan:   Principal Problem:   Toe osteomyelitis, right (HCC) Active Problems:   Essential hypertension   PAD (peripheral artery disease) (HCC)   GERD (gastroesophageal reflux disease)   Tobacco use disorder   Hyperlipidemia    Acute osteomyelitis of right hallux with abscess . -Continue with broad-spectrum antibiotic, IV vancomycin and Zosyn for now  -vascular surgery and podiatry input greatly appreciated, plan for angiogram today, with possible intervention. -Podiatry input greatly appreciated, plan for  the great toe to the level of the joint with possible removal of further bone to assist with primary wound closure. Marland Kitchen  PAD (peripheral artery disease) ([place]) Continue with aspirin and Plavix   Essential hypertension Continue with Norvasc, blood pressure intermittently elevated, will add as needed hydralazine   GERD (gastroesophageal reflux disease) Stable   Tobacco use disorder Patient continues to smoke.  Nicotine patch will be ordered.  She has no intention of quitting.   Hyperlipidemia Continue simvastatin.   DVT prophylaxis: lovenox Code Status: Full Family Communication: none at bedside  Status is: Observation  The patient will require care spanning > 2 midnights and should be moved to inpatient because: Patient  has acute osteomyelitis with abscess, in need of angiogram, IV antibiotics, and possible amputation.  N.      Consultants:  Podiatry Vascular surgery   Subjective: She denies any complaints today  Objective: Vitals:   02/17/21 0044 02/17/21 0500 02/17/21 0515 02/17/21 0729  BP: (!) 149/65  (!) 173/81 (!) 147/79  Pulse: 66  66 62  Resp: 18  18 18   Temp: 98.2 F (36.8 C)  98.6 F (37 C) 98.2 F (36.8 C)  TempSrc:   Oral Oral  SpO2: 97%  95% 99%  Weight:  103.1 kg    Height:        Intake/Output Summary (Last 24 hours) at 02/17/2021 1024 Last data filed at 02/16/2021 1400 Gross per 24 hour  Intake 240 ml  Output --  Net 240 ml   Filed Weights   02/14/21 1324 02/16/21 0500 02/17/21 0500  Weight: 101.6 kg 103.9 kg 103.1 kg    Examination:  Awake Alert, Oriented X 3, No new F.N deficits, Normal affect Symmetrical Chest wall movement, Good air movement bilaterally, CTAB RRR,No Gallops,Rubs or new Murmurs, No Parasternal Heave +ve B.Sounds, Abd Soft, No tenderness, No rebound - guarding or rigidity.  Right great toe with previous partial toe amputation, has small ulcer  present    Data Reviewed: I have personally reviewed following labs and imaging studies  CBC: Recent Labs  Lab 02/14/21 1407 02/15/21 0601 02/17/21 0420  WBC 5.3 4.5 5.3  NEUTROABS 2.6 2.0  --   HGB 10.7* 10.3* 10.6*  HCT 33.0* 32.4* 31.7*  MCV 100.0 99.4 97.5  PLT 340 286 834    Basic Metabolic Panel: Recent Labs  Lab 02/14/21  1407 02/15/21 0601 02/16/21 0513 02/17/21 0420  NA 139 136  --  135  K 3.6 3.7  --  4.0  CL 107 106  --  105  CO2 27 25  --  22  GLUCOSE 103* 121*  --  111*  BUN 13 13  --  15  CREATININE 0.63 0.65 0.74 0.61  CALCIUM 9.2 8.8*  --  9.0    GFR: Estimated Creatinine Clearance: 83.9 mL/min (by C-G formula based on SCr of 0.61 mg/dL).  Liver Function Tests: Recent Labs  Lab 02/14/21 1407 02/15/21 0601  AST 16 15  ALT 9 8  ALKPHOS 56 49  BILITOT  0.6 0.5  PROT 7.7 6.6  ALBUMIN 3.5 3.0*    CBG: Recent Labs  Lab 02/17/21 0824  GLUCAP 108*     Recent Results (from the past 240 hour(s))  Resp Panel by RT-PCR (Flu A&B, Covid) Nasopharyngeal Swab     Status: None   Collection Time: 02/14/21  5:39 PM   Specimen: Nasopharyngeal Swab; Nasopharyngeal(NP) swabs in vial transport medium  Result Value Ref Range Status   SARS Coronavirus 2 by RT PCR NEGATIVE NEGATIVE Final    Comment: (NOTE) SARS-CoV-2 target nucleic acids are NOT DETECTED.  The SARS-CoV-2 RNA is generally detectable in upper respiratory specimens during the acute phase of infection. The lowest concentration of SARS-CoV-2 viral copies this assay can detect is 138 copies/mL. A negative result does not preclude SARS-Cov-2 infection and should not be used as the sole basis for treatment or other patient management decisions. A negative result may occur with  improper specimen collection/handling, submission of specimen other than nasopharyngeal swab, presence of viral mutation(s) within the areas targeted by this assay, and inadequate number of viral copies(<138 copies/mL). A negative result must be combined with clinical observations, patient history, and epidemiological information. The expected result is Negative.  Fact Sheet for Patients:  EntrepreneurPulse.com.au  Fact Sheet for Healthcare Providers:  IncredibleEmployment.be  This test is no t yet approved or cleared by the [place] FDA and  has been authorized for detection and/or diagnosis of SARS-CoV-2 by FDA under an Emergency Use Authorization (EUA). This EUA will remain  in effect (meaning this test can be used) for the duration of the COVID-19 declaration under Section 564(b)(1) of the Act, 21 U.S.C.section 360bbb-3(b)(1), unless the authorization is terminated  or revoked sooner.       Influenza A by PCR NEGATIVE NEGATIVE Final   Influenza B by PCR  NEGATIVE NEGATIVE Final    Comment: (NOTE) The Xpert Xpress SARS-CoV-2/FLU/RSV plus assay is intended as an aid in the diagnosis of influenza from Nasopharyngeal swab specimens and should not be used as a sole basis for treatment. Nasal washings and aspirates are unacceptable for Xpert Xpress SARS-CoV-2/FLU/RSV testing.  Fact Sheet for Patients: EntrepreneurPulse.com.au  Fact Sheet for Healthcare Providers: IncredibleEmployment.be  This test is not yet approved or cleared by the [place] FDA and has been authorized for detection and/or diagnosis of SARS-CoV-2 by FDA under an Emergency Use Authorization (EUA). This EUA will remain in effect (meaning this test can be used) for the duration of the COVID-19 declaration under Section 564(b)(1) of the Act, 21 U.S.C. section 360bbb-3(b)(1), unless the authorization is terminated or revoked.  Performed at [facility], [address] [place], [place] [address]   Blood culture (routine x 2)     Status: None (Preliminary result)   Collection Time: 02/14/21  9:09 PM   Specimen: BLOOD  Result Value Ref Range Status   Specimen Description BLOOD LEFT ANTECUBITAL  Final   Special Requests   Final    BOTTLES DRAWN AEROBIC AND ANAEROBIC Blood Culture adequate volume   Culture   Final    NO GROWTH 3 DAYS Performed at [facility], [address] [place], [place] [address]    Report Status PENDING  Incomplete  Blood culture (routine x 2)     Status: None (Preliminary result)   Collection Time: 02/14/21  9:19 PM   Specimen: BLOOD  Result Value Ref Range Status   Specimen Description BLOOD BLOOD LEFT HAND  Final   Special Requests   Final    BOTTLES DRAWN AEROBIC AND ANAEROBIC Blood Culture adequate volume   Culture   Final    NO GROWTH 3 DAYS Performed at [facility], [address] [place], [place] [address]    Report Status PENDING  Incomplete  Aerobic/Anaerobic  Culture w Gram Stain (surgical/deep wound)     Status: None (Preliminary result)   Collection Time: 02/15/21 10:59 AM   Specimen: Toe; Wound  Result Value Ref Range Status   Specimen Description   Final    TOE LEFT Performed at [facility], [address] [place], [place] [address]    Special Requests   Final    NONE Performed at [facility], [address] [place],  [address]    Gram Stain   Final    NO SQUAMOUS EPITHELIAL CELLS SEEN FEW WBC SEEN NO ORGANISMS SEEN    Culture   Final    RARE STAPHYLOCOCCUS SIMULANS SUSCEPTIBILITIES TO FOLLOW Performed at [facility], [place] [address], [place],  [address]    Report Status PENDING  Incomplete         Radiology Studies: No results found.      Scheduled Meds:  amLODipine  5 mg Oral Daily   aspirin EC  81 mg Oral Daily   clopidogrel  75 mg Oral Daily   enoxaparin (LOVENOX) injection  0.5 mg/kg Subcutaneous Q24H   nicotine  14 mg Transdermal Daily   rosuvastatin  20 mg Oral Daily   Continuous Infusions:  sodium chloride 10 mL/hr at 02/15/21 1857   sodium chloride Stopped (02/17/21 0041)   piperacillin-tazobactam (ZOSYN)  IV 3.375 g (02/17/21 0321)   vancomycin 1,500 mg (02/16/21 1723)     LOS: 2 days        Phillips Climes, MD [facility]   To contact the attending provider between 7A-7P or the covering provider during after hours 7P-7A, please log into the web site [email or web address] and access using universal [place] password for that web site. If you do not have the password, please call the hospital operator.  02/17/2021, 10:24 AM

## 2021-02-18 ENCOUNTER — Encounter
Admission: EM | Disposition: A | Discharge status: Home Health | Payer: Self-pay | Source: Home / Self Care | Attending: Internal Medicine

## 2021-02-18 ENCOUNTER — Encounter: Payer: Self-pay | Admitting: Internal Medicine

## 2021-02-18 ENCOUNTER — Inpatient Hospital Stay: Payer: Medicare HMO | Admitting: Anesthesiology

## 2021-02-18 DIAGNOSIS — M869 Osteomyelitis, unspecified: Secondary | ICD-10-CM | POA: Diagnosis not present

## 2021-02-18 DIAGNOSIS — I70203 Unspecified atherosclerosis of native arteries of extremities, bilateral legs: Secondary | ICD-10-CM

## 2021-02-18 HISTORY — PX: AMPUTATION TOE: SHX6595

## 2021-02-18 HISTORY — PX: LOWER EXTREMITY ANGIOGRAPHY: CATH118251

## 2021-02-18 LAB — CBC
HCT: 31.5 % — ABNORMAL LOW (ref 36.0–46.0)
Hemoglobin: 10.5 g/dL — ABNORMAL LOW (ref 12.0–15.0)
MCH: 31.9 pg (ref 26.0–34.0)
MCHC: 33.3 g/dL (ref 30.0–36.0)
MCV: 95.7 fL (ref 80.0–100.0)
Platelets: 329 10*3/uL (ref 150–400)
RBC: 3.29 MIL/uL — ABNORMAL LOW (ref 3.87–5.11)
RDW: 13.9 % (ref 11.5–15.5)
WBC: 4.8 10*3/uL (ref 4.0–10.5)
nRBC: 0 % (ref 0.0–0.2)

## 2021-02-18 LAB — BASIC METABOLIC PANEL
Anion gap: 6 (ref 5–15)
BUN: 14 mg/dL (ref 8–23)
CO2: 24 mmol/L (ref 22–32)
Calcium: 9.2 mg/dL (ref 8.9–10.3)
Chloride: 104 mmol/L (ref 98–111)
Creatinine, Ser: 0.73 mg/dL (ref 0.44–1.00)
GFR, Estimated: >60 mL/min (ref >60)
Glucose, Bld: 108 mg/dL — ABNORMAL HIGH (ref 70–99)
Potassium: 3.9 mmol/L (ref 3.5–5.1)
Sodium: 134 mmol/L — ABNORMAL LOW (ref 135–145)

## 2021-02-18 LAB — GLUCOSE, CAPILLARY: Glucose-Capillary: 90 mg/dL (ref 70–99)

## 2021-02-18 SURGERY — LOWER EXTREMITY ANGIOGRAPHY
Anesthesia: Moderate Sedation | Laterality: Right

## 2021-02-18 SURGERY — AMPUTATION, TOE
Anesthesia: General | Site: Toe | Laterality: Right

## 2021-02-18 MED ORDER — FENTANYL CITRATE PF 50 MCG/ML IJ SOSY
PREFILLED_SYRINGE | INTRAMUSCULAR | Status: AC
  Filled 2021-02-18: qty 2

## 2021-02-18 MED ORDER — MORPHINE SULFATE (PF) 2 MG/ML IV SOLN: 2.0000 mg | INTRAVENOUS | Status: DC | PRN

## 2021-02-18 MED ORDER — PROPOFOL 10 MG/ML IV BOLUS
INTRAVENOUS | Status: AC
  Filled 2021-02-18: qty 20

## 2021-02-18 MED ORDER — MIDAZOLAM HCL 2 MG/ML PO SYRP: 8.0000 mg | ORAL_SOLUTION | Freq: Once | ORAL | Status: DC | PRN

## 2021-02-18 MED ORDER — PROPOFOL 10 MG/ML IV BOLUS
INTRAVENOUS | Status: DC | PRN
  Administered 2021-02-18: 20 mg via INTRAVENOUS

## 2021-02-18 MED ORDER — GLYCOPYRROLATE 0.2 MG/ML IJ SOLN
INTRAMUSCULAR | Status: DC | PRN
  Administered 2021-02-18: .2 mg via INTRAVENOUS

## 2021-02-18 MED ORDER — ONDANSETRON HCL 4 MG/2ML IJ SOLN: 4.0000 mg | Freq: Four times a day (QID) | INTRAMUSCULAR | Status: DC | PRN

## 2021-02-18 MED ORDER — FENTANYL CITRATE (PF) 100 MCG/2ML IJ SOLN: 25.0000 ug | INTRAMUSCULAR | Status: DC | PRN

## 2021-02-18 MED ORDER — FENTANYL CITRATE (PF) 100 MCG/2ML IJ SOLN
INTRAMUSCULAR | Status: DC | PRN
  Administered 2021-02-18: 50 ug via INTRAVENOUS
  Administered 2021-02-18: 25 ug via INTRAVENOUS

## 2021-02-18 MED ORDER — MIDAZOLAM HCL 2 MG/2ML IJ SOLN
INTRAMUSCULAR | Status: AC
  Filled 2021-02-18: qty 2

## 2021-02-18 MED ORDER — NITROGLYCERIN 1 MG/10 ML FOR IR/CATH LAB
INTRA_ARTERIAL | Status: DC | PRN
  Administered 2021-02-18: 400 ug

## 2021-02-18 MED ORDER — HYDRALAZINE HCL 20 MG/ML IJ SOLN
INTRAMUSCULAR | Status: DC | PRN
  Administered 2021-02-18 (×2): 10 mg via INTRAVENOUS

## 2021-02-18 MED ORDER — LACTATED RINGERS IV SOLN: INTRAVENOUS | Status: DC

## 2021-02-18 MED ORDER — 0.9 % SODIUM CHLORIDE (POUR BTL) OPTIME
TOPICAL | Status: DC | PRN
  Administered 2021-02-18: 250 mL

## 2021-02-18 MED ORDER — ALBUTEROL SULFATE HFA 108 (90 BASE) MCG/ACT IN AERS
INHALATION_SPRAY | RESPIRATORY_TRACT | Status: AC
  Filled 2021-02-18: qty 6.7

## 2021-02-18 MED ORDER — ONDANSETRON HCL 4 MG/2ML IJ SOLN
INTRAMUSCULAR | Status: DC | PRN
  Administered 2021-02-18: 4 mg via INTRAVENOUS

## 2021-02-18 MED ORDER — ACETAMINOPHEN 10 MG/ML IV SOLN: 1000.0000 mg | Freq: Once | INTRAVENOUS | Status: DC | PRN

## 2021-02-18 MED ORDER — SODIUM CHLORIDE 0.9% FLUSH
3.0000 mL | Freq: Two times a day (BID) | INTRAVENOUS | Status: DC
  Administered 2021-02-18 – 2021-02-19 (×2): 3 mL via INTRAVENOUS

## 2021-02-18 MED ORDER — HYDROMORPHONE HCL 1 MG/ML IJ SOLN: 1.0000 mg | Freq: Once | INTRAMUSCULAR | Status: DC | PRN

## 2021-02-18 MED ORDER — LIDOCAINE HCL (PF) 1 % IJ SOLN
INTRAMUSCULAR | Status: AC
  Filled 2021-02-18: qty 30

## 2021-02-18 MED ORDER — PHENYLEPHRINE HCL (PRESSORS) 10 MG/ML IV SOLN
INTRAVENOUS | Status: DC | PRN
  Administered 2021-02-18 (×2): 100 ug via INTRAVENOUS

## 2021-02-18 MED ORDER — ALBUTEROL SULFATE HFA 108 (90 BASE) MCG/ACT IN AERS
INHALATION_SPRAY | RESPIRATORY_TRACT | Status: DC | PRN
  Administered 2021-02-18: 2 via RESPIRATORY_TRACT

## 2021-02-18 MED ORDER — LIDOCAINE HCL (PF) 1 % IJ SOLN
INTRAMUSCULAR | Status: DC | PRN
  Administered 2021-02-18: 5 mL

## 2021-02-18 MED ORDER — HEPARIN SODIUM (PORCINE) 1000 UNIT/ML IJ SOLN
INTRAMUSCULAR | Status: DC | PRN
  Administered 2021-02-18: 5000 [IU] via INTRAVENOUS

## 2021-02-18 MED ORDER — ACETAMINOPHEN 325 MG PO TABS: 650.0000 mg | ORAL_TABLET | ORAL | Status: DC | PRN

## 2021-02-18 MED ORDER — OXYCODONE HCL 5 MG PO TABS: 5.0000 mg | ORAL_TABLET | Freq: Once | ORAL | Status: DC | PRN

## 2021-02-18 MED ORDER — SODIUM CHLORIDE 0.9% FLUSH: 3.0000 mL | INTRAVENOUS | Status: DC | PRN

## 2021-02-18 MED ORDER — HEPARIN SODIUM (PORCINE) 1000 UNIT/ML IJ SOLN
INTRAMUSCULAR | Status: AC
  Filled 2021-02-18: qty 10

## 2021-02-18 MED ORDER — IODIXANOL 320 MG/ML IV SOLN
INTRAVENOUS | Status: DC | PRN
  Administered 2021-02-18: 50 mL via INTRA_ARTERIAL

## 2021-02-18 MED ORDER — OXYCODONE HCL 5 MG/5ML PO SOLN: 5.0000 mg | Freq: Once | ORAL | Status: DC | PRN

## 2021-02-18 MED ORDER — BUPIVACAINE HCL (PF) 0.5 % IJ SOLN
INTRAMUSCULAR | Status: AC
  Filled 2021-02-18: qty 30

## 2021-02-18 MED ORDER — OXYCODONE HCL 5 MG PO TABS
5.0000 mg | ORAL_TABLET | ORAL | Status: DC | PRN
  Administered 2021-02-18 – 2021-02-19 (×5): 10 mg via ORAL
  Filled 2021-02-18 (×6): qty 2

## 2021-02-18 MED ORDER — SODIUM CHLORIDE 0.9 % IV SOLN: 250.0000 mL | INTRAVENOUS | Status: DC | PRN

## 2021-02-18 MED ORDER — MIDAZOLAM HCL 5 MG/5ML IJ SOLN
INTRAMUSCULAR | Status: AC
  Filled 2021-02-18: qty 5

## 2021-02-18 MED ORDER — BUPIVACAINE HCL 0.5 % IJ SOLN
INTRAMUSCULAR | Status: DC | PRN
  Administered 2021-02-18: 5 mL

## 2021-02-18 MED ORDER — SODIUM CHLORIDE 0.9 % IV SOLN: INTRAVENOUS | Status: AC

## 2021-02-18 MED ORDER — MIDAZOLAM HCL 2 MG/2ML IJ SOLN
INTRAMUSCULAR | Status: DC | PRN
  Administered 2021-02-18: .5 mg via INTRAVENOUS
  Administered 2021-02-18: 2 mg via INTRAVENOUS

## 2021-02-18 MED ORDER — CEFAZOLIN SODIUM-DEXTROSE 2-4 GM/100ML-% IV SOLN
INTRAVENOUS | Status: AC
  Administered 2021-02-18: 2 g via INTRAVENOUS
  Filled 2021-02-18: qty 100

## 2021-02-18 MED ORDER — HYDRALAZINE HCL 20 MG/ML IJ SOLN
INTRAMUSCULAR | Status: AC
  Filled 2021-02-18: qty 1

## 2021-02-18 MED ORDER — PROPOFOL 500 MG/50ML IV EMUL
INTRAVENOUS | Status: DC | PRN
  Administered 2021-02-18: 100 ug/kg/min via INTRAVENOUS

## 2021-02-18 MED ORDER — MIDAZOLAM HCL 2 MG/2ML IJ SOLN
INTRAMUSCULAR | Status: DC | PRN
  Administered 2021-02-18 (×2): 1 mg via INTRAVENOUS

## 2021-02-18 SURGICAL SUPPLY — 26 items
BALLN LUTONIX 5X120X130 (BALLOONS) ×2
BALLN LUTONIX DCB 4X40X130 (BALLOONS) ×2
BALLN LUTONIX DCB 5X100X130 (BALLOONS) ×2
BALLN LUTONIX DCB 5X80X130 (BALLOONS) ×2
BALLN ULTRASCORE 4X40X130 (BALLOONS) ×2
BALLOON LUTONIX 5X120X130 (BALLOONS) ×1 IMPLANT
BALLOON LUTONIX DCB 4X40X130 (BALLOONS) ×1 IMPLANT
BALLOON LUTONIX DCB 5X100X130 (BALLOONS) ×1 IMPLANT
BALLOON LUTONIX DCB 5X80X130 (BALLOONS) ×1 IMPLANT
BALLOON ULTRASCORE 4X40X130 (BALLOONS) ×1 IMPLANT
CATH ANGIO 5F PIGTAIL 65CM (CATHETERS) ×2 IMPLANT
COVER PROBE U/S 5X48 (MISCELLANEOUS) ×2 IMPLANT
DEVICE SAFEGUARD 24CM (GAUZE/BANDAGES/DRESSINGS) ×2 IMPLANT
DEVICE STARCLOSE SE CLOSURE (Vascular Products) ×2 IMPLANT
GAUZE SPONGE 4X4 12PLY STRL (GAUZE/BANDAGES/DRESSINGS) ×6 IMPLANT
GLIDEWIRE ADV .035X260CM (WIRE) ×2 IMPLANT
KIT ENCORE 26 ADVANTAGE (KITS) ×2 IMPLANT
NEEDLE ENTRY 21GA 7CM ECHOTIP (NEEDLE) ×2 IMPLANT
PACK ANGIOGRAPHY (CUSTOM PROCEDURE TRAY) ×2 IMPLANT
SET INTRO CAPELLA COAXIAL (SET/KITS/TRAYS/PACK) ×2 IMPLANT
SHEATH BRITE TIP 5FRX11 (SHEATH) ×2 IMPLANT
SHEATH RAABE 6FR (SHEATH) ×2 IMPLANT
STENT LIFESTENT 5F 6X100X135 (Permanent Stent) ×2 IMPLANT
SYR MEDRAD MARK 7 150ML (SYRINGE) ×2 IMPLANT
TUBING CONTRAST HIGH PRESS 72 (TUBING) ×2 IMPLANT
WIRE GUIDERIGHT .035X150 (WIRE) ×2 IMPLANT

## 2021-02-18 SURGICAL SUPPLY — 45 items
BLADE OSC/SAGITTAL MD 5.5X18 (BLADE) ×2 IMPLANT
BLADE SURG MINI STRL (BLADE) ×2 IMPLANT
BNDG CONFORM 2 STRL LF (GAUZE/BANDAGES/DRESSINGS) ×1 IMPLANT
BNDG CONFORM 3 STRL LF (GAUZE/BANDAGES/DRESSINGS) ×3 IMPLANT
BNDG ELASTIC 4X5.8 VLCR NS LF (GAUZE/BANDAGES/DRESSINGS) ×2 IMPLANT
BNDG ESMARK 4X12 TAN STRL LF (GAUZE/BANDAGES/DRESSINGS) ×2 IMPLANT
BNDG GAUZE ELAST 4 BULKY (GAUZE/BANDAGES/DRESSINGS) ×2 IMPLANT
CUFF TOURN SGL QUICK 12 (TOURNIQUET CUFF) IMPLANT
CUFF TOURN SGL QUICK 18X4 (TOURNIQUET CUFF) IMPLANT
DRAPE FLUOR MINI C-ARM 54X84 (DRAPES) ×1 IMPLANT
DRAPE XRAY CASSETTE 23X24 (DRAPES) ×1 IMPLANT
DURAPREP 26ML APPLICATOR (WOUND CARE) ×2 IMPLANT
ELECT REM PT RETURN 9FT ADLT (ELECTROSURGICAL) ×2
ELECTRODE REM PT RTRN 9FT ADLT (ELECTROSURGICAL) ×1 IMPLANT
GAUZE 4X4 16PLY ~~LOC~~+RFID DBL (SPONGE) ×2 IMPLANT
GAUZE PACKING IODOFORM 1/2 (PACKING) ×2 IMPLANT
GAUZE SPONGE 4X4 12PLY STRL (GAUZE/BANDAGES/DRESSINGS) ×2 IMPLANT
GAUZE XEROFORM 1X8 LF (GAUZE/BANDAGES/DRESSINGS) ×2 IMPLANT
GLOVE SURG ENC MOIS LTX SZ7.5 (GLOVE) ×2 IMPLANT
GLOVE SURG UNDER LTX SZ8 (GLOVE) ×2 IMPLANT
GOWN STRL REUS W/ TWL XL LVL3 (GOWN DISPOSABLE) ×2 IMPLANT
GOWN STRL REUS W/TWL XL LVL3 (GOWN DISPOSABLE) ×2
IV NS IRRIG 3000ML ARTHROMATIC (IV SOLUTION) ×2 IMPLANT
KIT TURNOVER KIT A (KITS) ×2 IMPLANT
LABEL OR SOLS (LABEL) ×2 IMPLANT
MANIFOLD NEPTUNE II (INSTRUMENTS) ×2 IMPLANT
NDL FILTER BLUNT 18X1 1/2 (NEEDLE) ×1 IMPLANT
NDL HYPO 25X1 1.5 SAFETY (NEEDLE) ×1 IMPLANT
NEEDLE FILTER BLUNT 18X 1/2SAF (NEEDLE) ×1
NEEDLE FILTER BLUNT 18X1 1/2 (NEEDLE) ×1 IMPLANT
NEEDLE HYPO 25X1 1.5 SAFETY (NEEDLE) ×2 IMPLANT
NS IRRIG 500ML POUR BTL (IV SOLUTION) ×2 IMPLANT
PACK EXTREMITY ARMC (MISCELLANEOUS) ×2 IMPLANT
PAD ABD DERMACEA PRESS 5X9 (GAUZE/BANDAGES/DRESSINGS) ×4 IMPLANT
PULSAVAC PLUS IRRIG FAN TIP (DISPOSABLE)
SHIELD FULL FACE ANTIFOG 7M (MISCELLANEOUS) ×2 IMPLANT
STOCKINETTE M/LG 89821 (MISCELLANEOUS) ×2 IMPLANT
STRAP SAFETY 5IN WIDE (MISCELLANEOUS) ×1 IMPLANT
SUT ETHILON 3-0 FS-10 30 BLK (SUTURE) ×2
SUT ETHILON 5-0 FS-2 18 BLK (SUTURE) ×2 IMPLANT
SUT VIC AB 4-0 FS2 27 (SUTURE) ×2 IMPLANT
SUTURE EHLN 3-0 FS-10 30 BLK (SUTURE) ×1 IMPLANT
SYR 10ML LL (SYRINGE) ×6 IMPLANT
TIP FAN IRRIG PULSAVAC PLUS (DISPOSABLE) ×1 IMPLANT
WATER STERILE IRR 500ML POUR (IV SOLUTION) ×1 IMPLANT

## 2021-02-18 NOTE — Progress Notes (Signed)
PROGRESS NOTE    Jackie Macias  BJS:[id number] DOB: 1955/06/30 DOA: 02/14/2021 PCP: Lavera Guise, MD    Chief Complaint  Patient presents with   Wound Infection    Brief Narrative:  65 year old African-American female with history of peripheral vascular disease status right 1st toe amputation several years ago, hypertension, reflux, tobacco abuse disorder presents to the ER today with after being sent to the ER from the podiatry office.  Patient was seen in podiatry office, started on oral Augmentin, sent to ED for further evaluation, her MRI significant for acute osteomyelitis and abscess, she is admitted for further evaluation.    11/23 OR today  Assessment & Plan:   Principal Problem:   Toe osteomyelitis, right (HCC) Active Problems:   Essential hypertension   PAD (peripheral artery disease) (HCC)   GERD (gastroesophageal reflux disease)   Tobacco use disorder   Hyperlipidemia    Acute osteomyelitis of right hallux with abscess . -Continue with broad-spectrum antibiotic, IV vancomycin and Zosyn for now  -vascular surgery and podiatry input greatly appreciated, plan for angiogram today, with possible intervention. 11/23 OR today  PAD (peripheral artery disease) (HCC) Continue aspirin and Plavix     Essential hypertension Overall BP stable Continue current management monitor   GERD (gastroesophageal reflux disease) Pepcid   Tobacco use disorder Patient continues to smoke.  Nicotine patch will be ordered.  She has no intention of quitting.   Hyperlipidemia Continue simvastatin.   DVT prophylaxis: lovenox Code Status: Full Family Communication: none at bedside  Status is: Inpatient Patient remains inpatient as she requires IV treatment and planning to go to the OR       Consultants:  Podiatry Vascular surgery   Subjective: Has no pain, shortness of breath or chest Objective: Vitals:   02/17/21 1933 02/18/21 0100 02/18/21 0450 02/18/21 0802   BP: (!) 151/75 (!) 130/54 140/62 135/61  Pulse: 69 65 63 (!) 59  Resp: 16 16 16 18   Temp: 98.3 F (36.8 C) 98.9 F (37.2 C) 98 F (36.7 C) 98.4 F (36.9 C)  TempSrc:    Oral  SpO2: 100% 97% 97% 99%  Weight:      Height:        Intake/Output Summary (Last 24 hours) at 02/18/2021 0833 Last data filed at 02/17/2021 1850 Gross per 24 hour  Intake 100.47 ml  Output --  Net 100.47 ml   Filed Weights   02/14/21 1324 02/16/21 0500 02/17/21 0500  Weight: 101.6 kg 103.9 kg 103.1 kg    Examination:  Calm, NAD CTA no wheeze rales Regular S1-S2 no gallops Soft benign positive bowel sounds No edema Awake oriented x3 Mood and affect appropriate in current setting   Data Reviewed: I have personally reviewed following labs and imaging studies  CBC: Recent Labs  Lab 02/14/21 1407 02/15/21 0601 02/17/21 0420 02/18/21 0455  WBC 5.3 4.5 5.3 4.8  NEUTROABS 2.6 2.0  --   --   HGB 10.7* 10.3* 10.6* 10.5*  HCT 33.0* 32.4* 31.7* 31.5*  MCV 100.0 99.4 97.5 95.7  PLT 340 286 308 607    Basic Metabolic Panel: Recent Labs  Lab 02/14/21 1407 02/15/21 0601 02/16/21 0513 02/17/21 0420 02/18/21 0455  NA 139 136  --  135 134*  K 3.6 3.7  --  4.0 3.9  CL 107 106  --  105 104  CO2 27 25  --  22 24  GLUCOSE 103* 121*  --  111* 108*  BUN 13 13  --  15 14  CREATININE 0.63 0.65 0.74 0.61 0.73  CALCIUM 9.2 8.8*  --  9.0 9.2    GFR: Estimated Creatinine Clearance: 83.9 mL/min (by C-G formula based on SCr of 0.73 mg/dL).  Liver Function Tests: Recent Labs  Lab 02/14/21 1407 02/15/21 0601  AST 16 15  ALT 9 8  ALKPHOS 56 49  BILITOT 0.6 0.5  PROT 7.7 6.6  ALBUMIN 3.5 3.0*    CBG: Recent Labs  Lab 02/17/21 0824 02/17/21 1125 02/17/21 2156 02/18/21 0804  GLUCAP 108* 78 94 90     Recent Results (from the past 240 hour(s))  Resp Panel by RT-PCR (Flu A&B, Covid) Nasopharyngeal Swab     Status: None   Collection Time: 02/14/21  5:39 PM   Specimen: Nasopharyngeal  Swab; Nasopharyngeal(NP) swabs in vial transport medium  Result Value Ref Range Status   SARS Coronavirus 2 by RT PCR NEGATIVE NEGATIVE Final    Comment: (NOTE) SARS-CoV-2 target nucleic acids are NOT DETECTED.  The SARS-CoV-2 RNA is generally detectable in upper respiratory specimens during the acute phase of infection. The lowest concentration of SARS-CoV-2 viral copies this assay can detect is 138 copies/mL. A negative result does not preclude SARS-Cov-2 infection and should not be used as the sole basis for treatment or other patient management decisions. A negative result may occur with  improper specimen collection/handling, submission of specimen other than nasopharyngeal swab, presence of viral mutation(s) within the areas targeted by this assay, and inadequate number of viral copies(<138 copies/mL). A negative result must be combined with clinical observations, patient history, and epidemiological information. The expected result is Negative.  Fact Sheet for Patients:  EntrepreneurPulse.com.au  Fact Sheet for Healthcare Providers:  IncredibleEmployment.be  This test is no t yet approved or cleared by the [place] FDA and  has been authorized for detection and/or diagnosis of SARS-CoV-2 by FDA under an Emergency Use Authorization (EUA). This EUA will remain  in effect (meaning this test can be used) for the duration of the COVID-19 declaration under Section 564(b)(1) of the Act, 21 U.S.C.section 360bbb-3(b)(1), unless the authorization is terminated  or revoked sooner.       Influenza A by PCR NEGATIVE NEGATIVE Final   Influenza B by PCR NEGATIVE NEGATIVE Final    Comment: (NOTE) The Xpert Xpress SARS-CoV-2/FLU/RSV plus assay is intended as an aid in the diagnosis of influenza from Nasopharyngeal swab specimens and should not be used as a sole basis for treatment. Nasal washings and aspirates are unacceptable for Xpert Xpress  SARS-CoV-2/FLU/RSV testing.  Fact Sheet for Patients: EntrepreneurPulse.com.au  Fact Sheet for Healthcare Providers: IncredibleEmployment.be  This test is not yet approved or cleared by the [place] FDA and has been authorized for detection and/or diagnosis of SARS-CoV-2 by FDA under an Emergency Use Authorization (EUA). This EUA will remain in effect (meaning this test can be used) for the duration of the COVID-19 declaration under Section 564(b)(1) of the Act, 21 U.S.C. section 360bbb-3(b)(1), unless the authorization is terminated or revoked.  Performed at [facility], [address] [place], [place] [address]   Blood culture (routine x 2)     Status: None (Preliminary result)   Collection Time: 02/14/21  9:09 PM   Specimen: BLOOD  Result Value Ref Range Status   Specimen Description BLOOD LEFT ANTECUBITAL  Final   Special Requests   Final    BOTTLES DRAWN AEROBIC AND ANAEROBIC Blood Culture adequate volume   Culture   Final  NO GROWTH 3 DAYS Performed at [facility], [address] [place], [place] [address]    Report Status PENDING  Incomplete  Blood culture (routine x 2)     Status: None (Preliminary result)   Collection Time: 02/14/21  9:19 PM   Specimen: BLOOD  Result Value Ref Range Status   Specimen Description BLOOD BLOOD LEFT HAND  Final   Special Requests   Final    BOTTLES DRAWN AEROBIC AND ANAEROBIC Blood Culture adequate volume   Culture   Final    NO GROWTH 3 DAYS Performed at [facility], [address], [place], [place] [address]    Report Status PENDING  Incomplete  Aerobic/Anaerobic Culture w Gram Stain (surgical/deep wound)     Status: None (Preliminary result)   Collection Time: 02/15/21 10:59 AM   Specimen: Toe; Wound  Result Value Ref Range Status   Specimen Description   Final    TOE LEFT Performed at [facility], [address], [place], [place]  [address]    Special Requests   Final    NONE Performed at [facility], [address] [place], [place] [address]    Gram Stain   Final    NO SQUAMOUS EPITHELIAL CELLS SEEN FEW WBC SEEN NO ORGANISMS SEEN Performed at [facility], [place] [address], [place], [place] [address]    Culture   Final    RARE STAPHYLOCOCCUS SIMULANS NO ANAEROBES ISOLATED; CULTURE IN PROGRESS FOR 5 DAYS    Report Status PENDING  Incomplete   Organism ID, Bacteria STAPHYLOCOCCUS SIMULANS  Final      Susceptibility   Staphylococcus simulans - MIC*    CIPROFLOXACIN <=0.5 SENSITIVE Sensitive     ERYTHROMYCIN <=0.25 SENSITIVE Sensitive     GENTAMICIN <=0.5 SENSITIVE Sensitive     OXACILLIN <=0.25 SENSITIVE Sensitive     TETRACYCLINE <=1 SENSITIVE Sensitive     VANCOMYCIN <=0.5 SENSITIVE Sensitive     TRIMETH/SULFA <=10 SENSITIVE Sensitive     CLINDAMYCIN <=0.25 SENSITIVE Sensitive     RIFAMPIN <=0.5 SENSITIVE Sensitive     Inducible Clindamycin NEGATIVE Sensitive     * RARE STAPHYLOCOCCUS SIMULANS         Radiology Studies: No results found.      Scheduled Meds:  amLODipine  5 mg Oral Daily   aspirin EC  81 mg Oral Daily   chlorhexidine  60 mL Topical Once   clopidogrel  75 mg Oral Daily   enoxaparin (LOVENOX) injection  0.5 mg/kg Subcutaneous Q24H   nicotine  14 mg Transdermal Daily   povidone-iodine  2 application Topical Once   rosuvastatin  20 mg Oral Daily   Continuous Infusions:  sodium chloride Stopped (02/15/21 1919)   sodium chloride Stopped (02/18/21 0051)   sodium chloride 75 mL/hr at 02/18/21 0050    ceFAZolin (ANCEF) IV 1 g (02/18/21 0625)    ceFAZolin (ANCEF) IV       LOS: 3 days    Time spent: 35 minutes with more than 50% on COC    Nolberto Hanlon, MD [facility]   To contact the attending provider between 7A-7P or the covering provider during after hours 7P-7A, please log into the web site [email or web address] and access using universal [place]  password for that web site. If you do not have the password, please call the hospital operator.

## 2021-02-18 NOTE — Op Note (Signed)
Operative note   Surgeon:Makenzey Nanni Lawyer: None    Preop diagnosis: Osteomyelitis right great toe    Postop diagnosis: Same    Procedure: Amputation right great toe MTPJ    EBL: Minimal    Anesthesia:local and IV sedation.  Local consisted of a one-to-one mixture of 0.5% bupivacaine and 1% lidocaine plain    Hemostasis: None    Specimen: Bone for culture and proximal phalanx for pathology    Complications: None    Operative indications:Jackie Macias is an 65 y.o. that presents today for surgical intervention.  The risks/benefits/alternatives/complications have been discussed and consent has been given.    Procedure:  Patient was brought into the OR and placed on the operating table in thesupine position. After anesthesia was obtained theright lower extremity was prepped and draped in usual sterile fashion.  Attention was directed to the right great toe where the residual proximal phalanx was noted.  Patient had undergone previous distal phalanx amputation.  A fishmouth incision was performed at the level of the MTPJ.  Full-thickness flaps were created.  The proximal phalanx was disarticulated at the MTPJ and removed from the surgical field in toto.  The distal aspect of the great toe was opened and a small sample of bone was sent for wound culture.  Obvious purulence was noted at this time.  The remaining toe was sent for pathological examination.  All bleeders were Bovie cauterized.  The wound was flushed with copious amounts of irrigation.  Closure was then performed with a 3-0 and 5-0 nylon.    Patient tolerated the procedure and anesthesia well.  Was transported from the OR to the PACU with all vital signs stable and vascular status intact. To be discharged per routine protocol.  Will follow up in approximately 1 week in the outpatient clinic.

## 2021-02-18 NOTE — Progress Notes (Signed)
Patient belongings sent to PACU including jewelry, 4 rings and a medical alert bracelet.

## 2021-02-18 NOTE — Anesthesia Postprocedure Evaluation (Signed)
Anesthesia Post Note  Patient: Jackie Macias  Procedure(s) Performed: AMPUTATION TOE-GREAT TOE (Right: Toe)  Patient location during evaluation: PACU Anesthesia Type: General Level of consciousness: awake and alert Pain management: pain level controlled Vital Signs Assessment: post-procedure vital signs reviewed and stable Respiratory status: spontaneous breathing, nonlabored ventilation, respiratory function stable and patient connected to nasal cannula oxygen Cardiovascular status: blood pressure returned to baseline and stable Postop Assessment: no apparent nausea or vomiting Anesthetic complications: no   No notable events documented.   Last Vitals:  Vitals:   02/18/21 1501 02/18/21 1615  BP: (!) 150/61 139/64  Pulse: 73 85  Resp:  18  Temp: 36.6 C (!) 36.2 C  SpO2: 100% 97%    Last Pain:  Vitals:   02/18/21 1615  TempSrc:   PainSc: 0-No pain                 Arita Miss

## 2021-02-18 NOTE — Anesthesia Preprocedure Evaluation (Addendum)
Anesthesia Evaluation  Patient identified by MRN, date of birth, ID band Patient awake    Reviewed: Allergy & Precautions, NPO status , Patient's Chart, lab work & pertinent test results  History of Anesthesia Complications Negative for: history of anesthetic complications  Airway Mallampati: II  TM Distance: >3 FB Neck ROM: Full    Dental  (+) Upper Dentures, Poor Dentition, Partial Lower   Pulmonary neg sleep apnea, COPD,  COPD inhaler, Current Smoker and Patient abstained from smoking.,    Pulmonary exam normal breath sounds clear to auscultation       Cardiovascular Exercise Tolerance: Good METShypertension, + Peripheral Vascular Disease (s/p LE stents on Plavix; non-critical carotid stenosis)  (-) CAD and (-) Past MI (-) dysrhythmias  Rhythm:Regular Rate:Normal - Systolic murmurs    Neuro/Psych negative neurological ROS  negative psych ROS   GI/Hepatic GERD  ,(+)     (-) substance abuse  ,   Endo/Other  neg diabetesObesity   Renal/GU negative Renal ROS     Musculoskeletal  (+) Arthritis , Gout; acute osteomyelitis right hallux with abscess   Abdominal   Peds  Hematology  (+) anemia ,   Anesthesia Other Findings Past Medical History: No date: Arthritis No date: Bronchitis No date: COPD (chronic obstructive pulmonary disease) (HCC) No date: GERD (gastroesophageal reflux disease) No date: Gout No date: Hypertension  Reproductive/Obstetrics                            Anesthesia Physical Anesthesia Plan  ASA: 3  Anesthesia Plan: General   Post-op Pain Management: Ofirmev IV (intra-op)   Induction: Intravenous  PONV Risk Score and Plan: 2 and Treatment may vary due to age or medical condition, Ondansetron and Dexamethasone  Airway Management Planned: Natural Airway  Additional Equipment: None  Intra-op Plan:   Post-operative Plan:   Informed Consent: I have reviewed  the patients History and Physical, chart, labs and discussed the procedure including the risks, benefits and alternatives for the proposed anesthesia with the patient or authorized representative who has indicated his/her understanding and acceptance.     Dental advisory given  Plan Discussed with: CRNA  Anesthesia Plan Comments: (LMA/GETA backup discussed.  Patient consented for risks of anesthesia including but not limited to:  - adverse reactions to medications - damage to eyes, teeth, lips or other oral mucosa - nerve damage due to positioning  - sore throat or hoarseness - damage to heart, brain, nerves, lungs, other parts of body or loss of life  Informed patient about role of CRNA in peri- and intra-operative care.  Patient voiced understanding.)      Anesthesia Quick Evaluation

## 2021-02-18 NOTE — Transfer of Care (Signed)
Immediate Anesthesia Transfer of Care Note  Patient: Jackie Macias  Procedure(s) Performed: AMPUTATION TOE-GREAT TOE (Right: Toe)  Patient Location: PACU  Anesthesia Type:MAC  Level of Consciousness: awake, alert  and oriented  Airway & Oxygen Therapy: Patient Spontanous Breathing  Post-op Assessment: Report given to RN and Post -op Vital signs reviewed and stable  Post vital signs: Reviewed and stable  Last Vitals:  Vitals Value Taken Time  BP 139/64   Temp    Pulse 85 02/18/21 1613  Resp 11 02/18/21 1613  SpO2 100 % 02/18/21 1613  Vitals shown include unvalidated device data.  Last Pain:  Vitals:   02/18/21 1501  TempSrc: Temporal  PainSc: 0-No pain         Complications: No notable events documented.

## 2021-02-18 NOTE — Op Note (Signed)
[facility]  Percutaneous Study/Intervention Procedural Note   Date of Surgery: 02/18/2021  Surgeon:  Jackie Macias  Pre-operative Diagnosis: Atherosclerotic occlusive disease bilateral lower extremities with right lower extremity  Post-operative diagnosis:  Same  Procedure(s) Performed:             1.  Introduction catheter into right lower extremity 3rd order catheter placement               2.    Contrast injection right lower extremity for distal runoff             3.  Percutaneous transluminal angioplasty and stent placement right superficial femoral and popliteal arteries to 5 mm             4.  Percutaneous transluminal angioplasty distal tibial peroneal trunk and proximal peroneal to 4 mm             5.  Star close closure left common femoral arteriotomy  Anesthesia: Conscious sedation was administered under my direct supervision by the interventional radiology RN. IV Versed plus fentanyl were utilized. Continuous ECG, pulse oximetry and blood pressure was monitored throughout the entire procedure.  Conscious sedation was for a total of 1 hour 47 minutes and 54 sec.  Sheath: 6 [place] Rabie left common femoral retrograde  Contrast: 50 cc  Fluoroscopy Time: 6.1 minutes  Indications:  [name] presents with increasing pain of the right lower extremity.  She is admitted with infection and ulceration of the right foot.  She has known atherosclerotic occlusive disease.  This suggests the patient is having limb threatening ischemia. The risks and benefits are reviewed all questions answered patient agrees to proceed.  Procedure: Jackie Macias is a 65 y.o. y.o. female who was identified and appropriate procedural time out was performed.  The patient was then placed supine on the table and prepped and draped in the usual sterile fashion.    Ultrasound was placed in the sterile sleeve and the left groin was evaluated the left common femoral artery was  echolucent and pulsatile indicating patency.  Image was recorded for the permanent record and under real-time visualization a microneedle was inserted into the common femoral artery followed by the microwire and then the micro-sheath.  A J-wire was then advanced through the micro-sheath and a  5 [place] sheath was then inserted over a J-wire. J-wire was then advanced and a 5 French pigtail catheter was positioned at the level of T12.  AP projection of the aorta was then obtained. Pigtail catheter was repositioned to above the bifurcation and a LAO view of the pelvis was obtained.  Subsequently a pigtail catheter with the advantage Glidewire was used to cross the aortic bifurcation the catheter wire were advanced down into the right distal external iliac artery. Oblique view of the femoral bifurcation was then obtained and subsequently the wire was reintroduced and the pigtail catheter negotiated into the SFA representing third order catheter placement. Distal runoff was then performed.  Diagnostic interpretation: The abdominal aorta is opacified with a bolus injection contrast.  There is atherosclerotic changes throughout but there are no hemodynamically significant stenoses.  The aortic bifurcation is widely patent.  Bilateral common iliac arteries are diffusely diseased but there are no hemodynamically significant stenoses.  On the right at the origin of the internal iliac artery is greater than 90% stenotic.  The origin of the right external iliac artery demonstrates a 30 to 40% stenosis which does not appear  to be hemodynamically significant.  Distal to this lesion the right external iliac artery is widely patent.  On the left the external iliac artery is widely patent.  The right common femoral demonstrates mild to moderate atherosclerotic changes as does the profunda femoris but there are no hemodynamically significant lesions identified in the common femoral.  The right superficial femoral and popliteal  artery demonstrates diffuse atherosclerotic changes with multiple tandem lesions.  At [facility] canal there is a focal 70% stenosis over a distance of 15 mm.  At the proximal popliteal there again is an 70% stenosis over the distance of 15 to 20 mm.  In the mid popliteal there is a focal greater than 90% stenosis over a distance of approximately 10 mm this is proximal to the previously placed stent.  The previously placed popliteal and tibioperoneal trunk stent is patent at its distal margin in the peroneal there is a 70% stenosis.  Distal to this lesion the peroneal appears widely patent and has multiple collaterals filling the plantar vessels which fill the pedal arch.  There is complete nonvisualization of the anterior tibial and posterior tibial throughout their course entirety.  5000 units of heparin was then given and allowed to circulate and a 6 [place] Rabie sheath was advanced up and over the bifurcation and positioned in the proximal superficial femoral artery  A Kumpe catheter and advantage Glidewire were then negotiated down into the distal peroneal.   4 mm x 40 mm ultra score balloon was used to angioplasty the proximalmost lesion in the SFA at [place] canal.  Next a 5 mm x 80 mm Lutonix drug-eluting balloon was used to treat this area inflation was to 10 atm for 1 full minute. Follow-up imaging demonstrated excellent patency with less than 10% residual stenosis.  The detector was then repositioned down to the mid popliteal for the focal 90% stenosis the 4 mm x 40 mm ultra score balloon was again utilized.  Inflation was to 10 atm for 1 minute.  Next a 5 mm x 100 mm Lutonix drug-eluting balloon was used to treat from the proximal edge of the existing stent toward [place]'s canal.  Inflation was to 10 atm 1 minute.  Follow-up imaging demonstrated greater than 50% residual stenosis in multiple locations and therefore a 6 x 100 mm life stent was deployed and then postdilated with a 5 mm Lutonix  drug-eluting balloon.  Inflation was to 10 atm for 1 full minute.  Follow-up imaging now demonstrated wide patency of the entire popliteal with less than 10% residual stenosis.  The detector was then repositioned and the tibioperoneal trunk and proximal peroneal reimaged 70% stenosis is noted in this area and after appropriate measurements are made a 4 x 40 Lutonix balloon was used to angioplasty the proximal peroneal and the distal tibioperoneal trunk. Inflation was to 4 atmospheres for 1. Follow-up imaging demonstrated patency with excellent result with less than 10% residual stenosis. Distal runoff was then reassessed and noted to be widely patent.  Some spasm was noted in the distal peroneal and 400 mcg of nitroglycerin was injected intra-arterially.  Follow-up imaging demonstrated near total resolution of the spasm.  After review of these images the sheath is pulled into the left external iliac oblique of the common femoral is obtained and a Star close device deployed. There no immediate Complications.  Findings:   The abdominal aorta is opacified with a bolus injection contrast.  There is atherosclerotic changes throughout but there are no hemodynamically significant stenoses.  The aortic bifurcation is widely patent.  Bilateral common iliac arteries are diffusely diseased but there are no hemodynamically significant stenoses.  On the right at the origin of the internal iliac artery is greater than 90% stenotic.  The origin of the right external iliac artery demonstrates a 30 to 40% stenosis which does not appear to be hemodynamically significant.  Distal to this lesion the right external iliac artery is widely patent.  On the left the external iliac artery is widely patent.  The right common femoral demonstrates mild to moderate atherosclerotic changes as does the profunda femoris but there are no hemodynamically significant lesions identified in the common femoral.  The right superficial femoral and  popliteal artery demonstrates diffuse atherosclerotic changes with multiple tandem lesions.  At [facility] canal there is a focal 70% stenosis over a distance of 15 mm.  At the proximal popliteal there again is an 70% stenosis over the distance of 15 to 20 mm.  In the mid popliteal there is a focal greater than 90% stenosis over a distance of approximately 10 mm this is proximal to the previously placed stent.  The previously placed popliteal and tibioperoneal trunk stent is patent at its distal margin in the peroneal there is a 70% stenosis.  Distal to this lesion the peroneal appears widely patent and has multiple collaterals filling the plantar vessels which fill the pedal arch.  There is complete nonvisualization of the anterior tibial and posterior tibial throughout their course entirety.   Following angioplasty peroneal and tibioperoneal trunk now has less than 10% residual stenosis with inline flow and looks quite nice. Angioplasty and stent placement of the popliteal artery as well as angioplasty of the SFA at [place]'s canal yields an excellent result with less than 10% residual stenosis.  Summary: Successful recanalization right lower extremity for limb salvage                           Disposition: Patient was taken to the recovery room in stable condition having tolerated the procedure well.  Belenda Cruise Tarena Gockley 02/18/2021,2:21 PM

## 2021-02-18 NOTE — Anesthesia Procedure Notes (Signed)
Date/Time: 02/18/2021 3:42 PM Performed by: Lily Peer, Rily Nickey, CRNA Pre-anesthesia Checklist: Patient identified, Emergency Drugs available, Suction available, Patient being monitored and Timeout performed Patient Re-evaluated:Patient Re-evaluated prior to induction Oxygen Delivery Method: Nasal cannula and Simple face mask Induction Type: IV induction

## 2021-02-18 NOTE — Interval H&P Note (Signed)
History and Physical Interval Note:  02/18/2021 12:58 PM  Jackie Macias  has presented today for surgery, with the diagnosis of right great toe chronic infection with severe PAD.  The various methods of treatment have been discussed with the patient and family. After consideration of risks, benefits and other options for treatment, the patient has consented to  Procedure(s): LOWER EXTREMITY ANGIOGRAPHY (Right) as a surgical intervention.  The patient's history has been reviewed, patient examined, no change in status, stable for surgery.  I have reviewed the patient's chart and labs.  Questions were answered to the patient's satisfaction.     Hortencia Pilar

## 2021-02-19 ENCOUNTER — Encounter: Payer: Self-pay | Admitting: Podiatry

## 2021-02-19 DIAGNOSIS — M869 Osteomyelitis, unspecified: Secondary | ICD-10-CM | POA: Diagnosis not present

## 2021-02-19 LAB — CULTURE, BLOOD (ROUTINE X 2)
Culture: NO GROWTH
Culture: NO GROWTH
Special Requests: ADEQUATE
Special Requests: ADEQUATE

## 2021-02-19 LAB — CBC
HCT: 33.3 % — ABNORMAL LOW (ref 36.0–46.0)
Hemoglobin: 10.8 g/dL — ABNORMAL LOW (ref 12.0–15.0)
MCH: 31.6 pg (ref 26.0–34.0)
MCHC: 32.4 g/dL (ref 30.0–36.0)
MCV: 97.4 fL (ref 80.0–100.0)
Platelets: 309 10*3/uL (ref 150–400)
RBC: 3.42 MIL/uL — ABNORMAL LOW (ref 3.87–5.11)
RDW: 14.1 % (ref 11.5–15.5)
WBC: 5.3 10*3/uL (ref 4.0–10.5)
nRBC: 0 % (ref 0.0–0.2)

## 2021-02-19 LAB — CREATININE, SERUM
Creatinine, Ser: 0.66 mg/dL (ref 0.44–1.00)
GFR, Estimated: >60 mL/min (ref >60)

## 2021-02-19 LAB — GLUCOSE, CAPILLARY
Glucose-Capillary: 116 mg/dL — ABNORMAL HIGH (ref 70–99)
Glucose-Capillary: 104 mg/dL — ABNORMAL HIGH (ref 70–99)

## 2021-02-19 MED ORDER — AMOXICILLIN-POT CLAVULANATE 875-125 MG PO TABS: 1.0000 | ORAL_TABLET | Freq: Two times a day (BID) | ORAL | 0 refills | Status: AC

## 2021-02-19 MED ORDER — NICOTINE 14 MG/24HR TD PT24: 14.0000 mg | MEDICATED_PATCH | Freq: Every day | TRANSDERMAL | 0 refills | Status: DC

## 2021-02-19 MED ORDER — OXYCODONE HCL 5 MG PO TABS: 5.0000 mg | ORAL_TABLET | ORAL | 0 refills | Status: AC | PRN

## 2021-02-19 NOTE — Progress Notes (Signed)
    Subjective  - POD #1, s/p angio with left leg stenting  No complaints today   Physical Exam:  Left groin canulation site without hematoma       Assessment/Plan:  POD #1  Stable from vascular perspective.  OK to d/c with outpatient follow up  Wells Chin Wachter 02/19/2021 2:29 PM --  Vitals:   02/19/21 0843 02/19/21 1136  BP: 126/62 (!) 144/60  Pulse: 63 68  Resp: 18 18  Temp: (!) 97.5 F (36.4 C) 98.2 F (36.8 C)  SpO2: 100% 98%    Intake/Output Summary (Last 24 hours) at 02/19/2021 1429 Last data filed at 02/19/2021 1300 Gross per 24 hour  Intake 1886.06 ml  Output 655 ml  Net 1231.06 ml     Laboratory CBC    Component Value Date/Time   WBC 5.3 02/19/2021 0835   HGB 10.8 (L) 02/19/2021 0835   HCT 33.3 (L) 02/19/2021 0835   PLT 309 02/19/2021 0835    BMET    Component Value Date/Time   NA 134 (L) 02/18/2021 0455   K 3.9 02/18/2021 0455   CL 104 02/18/2021 0455   CO2 24 02/18/2021 0455   GLUCOSE 108 (H) 02/18/2021 0455   BUN 14 02/18/2021 0455   CREATININE 0.66 02/19/2021 0835   CALCIUM 9.2 02/18/2021 0455   GFRNONAA >60 02/19/2021 0835   GFRAA >60 09/15/2018 1742    COAG Lab Results  Component Value Date   INR 1.04 01/05/2017   No results found for: PTT  Antibiotics Anti-infectives (From admission, onward)    Start     Dose/Rate Route Frequency Ordered Stop   02/18/21 0000  ceFAZolin (ANCEF) IVPB 2g/100 mL premix       Note to Pharmacy: To be given in specials   2 g 200 mL/hr over 30 Minutes Intravenous  Once 02/17/21 2313 02/18/21 1720   02/17/21 1700  ceFAZolin (ANCEF) IVPB 1 g/50 mL premix        1 g 100 mL/hr over 30 Minutes Intravenous Every 8 hours 02/17/21 1346     02/15/21 1730  vancomycin (VANCOREADY) IVPB 1500 mg/300 mL  Status:  Discontinued        1,500 mg 150 mL/hr over 120 Minutes Intravenous Every 24 hours 02/14/21 2105 02/17/21 1347   02/14/21 2300  piperacillin-tazobactam (ZOSYN) IVPB 3.375 g  Status:   Discontinued        3.375 g 12.5 mL/hr over 240 Minutes Intravenous Every 8 hours 02/14/21 1800 02/17/21 1346   02/14/21 1700  vancomycin (VANCOREADY) IVPB 2000 mg/400 mL        2,000 mg 200 mL/hr over 120 Minutes Intravenous  Once 02/14/21 1643 02/14/21 2005   02/14/21 1645  piperacillin-tazobactam (ZOSYN) IVPB 3.375 g        3.375 g 100 mL/hr over 30 Minutes Intravenous  Once 02/14/21 1643 02/14/21 1741        V. Leia Alf, M.D., [facility] [place] Office: [phone] Pager:  [phone]

## 2021-02-19 NOTE — Progress Notes (Signed)
Daily Progress Note   Subjective  - 1 Day Post-Op  F/u right great toe amputation.  No complaints.  Objective Vitals:   02/19/21 0110 02/19/21 0537 02/19/21 0600 02/19/21 0843  BP: 128/66 (!) 148/71  126/62  Pulse: 68 65  63  Resp: 18 16  18   Temp: 98 F (36.7 C) 97.6 F (36.4 C)  (!) 97.5 F (36.4 C)  TempSrc: Oral Oral  Oral  SpO2: 98% 99%  100%  Weight:   104 kg   Height:        Physical Exam: Dressing changed.  Stable. Dry.  Laboratory CBC    Component Value Date/Time   WBC 5.3 02/19/2021 0835   HGB 10.8 (L) 02/19/2021 0835   HCT 33.3 (L) 02/19/2021 0835   PLT 309 02/19/2021 0835    BMET    Component Value Date/Time   NA 134 (L) 02/18/2021 0455   K 3.9 02/18/2021 0455   CL 104 02/18/2021 0455   CO2 24 02/18/2021 0455   GLUCOSE 108 (H) 02/18/2021 0455   BUN 14 02/18/2021 0455   CREATININE 0.66 02/19/2021 0835   CALCIUM 9.2 02/18/2021 0455   GFRNONAA >60 02/19/2021 0835   GFRAA >60 09/15/2018 1742    Assessment/Planning: S/p amputation for osteomyelitis  Grossly intra-op proximal margin appeared clean. I would be comfortable with oral antibiotics upon d/c x 7 days.   Dressing should be left clean and dry.  Will change in office next week. WBAT in post op shoe. F/u with me in 1 week. From podiatry standpoint pt is stable for d/c.  Samara Deist A  02/19/2021, 11:05 AM

## 2021-02-19 NOTE — Evaluation (Signed)
Physical Therapy Evaluation Patient Details Name: Jackie Macias MRN: [id number] DOB: 01-15-1956 Today's Date: 02/19/2021  History of Present Illness  Pt is a 65 yo female with history of peripheral vascular disease status right 1st toe amputation several years ago, hypertension, reflux, tobacco abuse disorder presents to the ER from the podiatry office.  MD assessment includes acute osteomyelitis of right hallux with abscess s/p amputation right great toe MTPJ and essential hypertension.   Clinical Impression  Pt was pleasant and motivated to participate during the session and put forth good effort throughout. Pt required no physical assistance during the session and demonstrated good stability with transfers, gait, and stair training.  Pt reported no adverse symptoms during the session with SpO2 and HR WNL. Pt will benefit from HHPT upon discharge to safely address deficits listed in patient problem list for decreased caregiver assistance and eventual return to PLOF.         Recommendations for follow up therapy are one component of a multi-disciplinary discharge planning process, led by the attending physician.  Recommendations may be updated based on patient status, additional functional criteria and insurance authorization.  Follow Up Recommendations Home health PT    Assistance Recommended at Discharge Intermittent Supervision/Assistance  Functional Status Assessment Patient has had a recent decline in their functional status and demonstrates the ability to make significant improvements in function in a reasonable and predictable amount of time.  Equipment Recommendations  None recommended by PT    Recommendations for Other Services       Precautions / Restrictions Precautions Precautions: Fall Restrictions Weight Bearing Restrictions: Yes RLE Weight Bearing: Weight bearing as tolerated Other Position/Activity Restrictions: WBAT to RLE with post-op shoe donned with effort made  to WB through the heel      Mobility  Bed Mobility Overal bed mobility: Modified Independent             General bed mobility comments: Min extra time and effort only    Transfers Overall transfer level: Needs assistance Equipment used: Rolling walker (2 wheels) Transfers: Sit to/from Stand Sit to Stand: Min guard;From elevated surface           General transfer comment: Min verbal cues for sequencing    Ambulation/Gait Ambulation/Gait assistance: Min guard Gait Distance (Feet): 40 Feet Assistive device: Rolling walker (2 wheels) Gait Pattern/deviations: Step-to pattern Gait velocity: decreased     General Gait Details: Min verbal cues for sequencing for heel WB through the RLE with step-to pattern; pt steady without LOB  Stairs Stairs: Yes Stairs assistance: Min guard Stair Management: Two rails Number of Stairs: 4 General stair comments: Verbal and visual cues for sequencing with pt able to ascend and descend 4 steps without instability and with good control  Wheelchair Mobility    Modified Rankin (Stroke Patients Only)       Balance Overall balance assessment: Needs assistance   Sitting balance-Leahy Scale: Normal     Standing balance support: Bilateral upper extremity supported;During functional activity Standing balance-Leahy Scale: Good                               Pertinent Vitals/Pain Pain Assessment: No/denies pain    Home Living Family/patient expects to be discharged to:: Private residence Living Arrangements: Non-relatives/Friends Available Help at Discharge: Friend(s);Personal care attendant Type of Home: House Home Access: Stairs to enter Entrance Stairs-Rails: Right;Left;Can reach both Entrance Stairs-Number of Steps: 3   Home Layout:  Two level;Able to live on main level with bedroom/bathroom Home Equipment: Air cabin crew (4 wheels);Rolling Walker (2 wheels);BSC/3in1;Cane - single point Additional  Comments: PCA Mon-Sat 8am-11am    Prior Function Prior Level of Function : Independent/Modified Independent             Mobility Comments: Mod Ind ambulation with a SPC limited community distances, 2 falls in the last year       Hand Dominance        Extremity/Trunk Assessment   Upper Extremity Assessment Upper Extremity Assessment: Overall WFL for tasks assessed    Lower Extremity Assessment Lower Extremity Assessment: Generalized weakness       Communication   Communication: No difficulties  Cognition Arousal/Alertness: Awake/alert Behavior During Therapy: WFL for tasks assessed/performed Overall Cognitive Status: Within Functional Limits for tasks assessed                                          General Comments      Exercises     Assessment/Plan    PT Assessment Patient needs continued PT services  PT Problem List Decreased strength;Decreased activity tolerance;Decreased balance;Decreased mobility;Decreased knowledge of use of DME       PT Treatment Interventions DME instruction;Gait training;Stair training;Functional mobility training;Therapeutic activities;Therapeutic exercise;Balance training;Patient/family education    PT Goals (Current goals can be found in the Care Plan section)  Acute Rehab PT Goals Patient Stated Goal: To be able to do things around the house better PT Goal Formulation: With patient Time For Goal Achievement: 03/04/21 Potential to Achieve Goals: Good    Frequency 7X/week   Barriers to discharge        Co-evaluation               AM-PAC PT "6 Clicks" Mobility  Outcome Measure Help needed turning from your back to your side while in a flat bed without using bedrails?: None Help needed moving from lying on your back to sitting on the side of a flat bed without using bedrails?: None Help needed moving to and from a bed to a chair (including a wheelchair)?: A Little Help needed standing up from a  chair using your arms (e.g., wheelchair or bedside chair)?: A Little Help needed to walk in hospital room?: A Little Help needed climbing 3-5 steps with a railing? : A Little 6 Click Score: 20    End of Session Equipment Utilized During Treatment: Gait belt Activity Tolerance: Patient tolerated treatment well Patient left: in chair;with call bell/phone within reach Nurse Communication: Mobility status PT Visit Diagnosis: Difficulty in walking, not elsewhere classified (R26.2);Muscle weakness (generalized) (M62.81)    Time: 1700-1749 PT Time Calculation (min) (ACUTE ONLY): 27 min   Charges:   PT Evaluation $PT Eval Moderate Complexity: 1 Mod PT Treatments $Gait Training: 8-22 mins       D. Royetta Asal PT, DPT 02/19/21, 4:01 PM

## 2021-02-19 NOTE — TOC Transition Note (Signed)
Transition of Care [facility]) - CM/SW Discharge Note   Patient Details  Name: NAOMII KREGER MRN: [id number] Date of Birth: 30-Jul-1955  Transition [facility] CM/SW Contact:  Shelbie Hutching, RN Phone Number: 02/19/2021, 2:41 PM   Clinical Narrative:    Patient medically cleared for DC home with home health services today.  [place] with [facility] will accept patient for RN, PT, and OT.  Nursing should be able to go out this weekend.  Patient has a walker at home and a bedside commode.  Patient's son can pick her up today.   Patient has personal care services at home through an agency.     Final next level of care: [place] Barriers to Discharge: Barriers Resolved   Patient Goals and CMS Choice Patient states their goals for this hospitalization and ongoing recovery are:: Patient wants to go home CMS Medicare.gov Compare Post Acute Care list provided to:: Patient Choice offered to / list presented to : Patient  Discharge Placement                       Discharge Plan and Services   Discharge Planning Services: CM Consult Post Acute Care Choice: Home Health          DME Arranged: N/A DME Agency: NA       HH Arranged: RN, PT, OT HH Agency: [place] Date [place]: 02/19/21 Time [place]: 1440 Representative spoke with at [place]: [place]  Social Determinants of Health ([place]) Interventions     Readmission Risk Interventions No flowsheet data found.

## 2021-02-19 NOTE — Discharge Summary (Signed)
Jackie Macias XBL:[id number] DOB: Mar 23, 1956 DOA: 02/14/2021  PCP: Lavera Guise, MD  Admit date: 02/14/2021 Discharge date: 02/20/2021  Admitted From: home Disposition:  home  Recommendations for Outpatient Follow-up:  Follow up with PCP in 1 week Please obtain BMP/CBC in one week Please follow up with vascular surgery in 1 month Follow-up with podiatry in 1 week  Home Health: yes  Discharge Condition:Stable CODE STATUS: Full Diet recommendation: Heart Healthy  Brief/Interim Summary: Per HPI:65 year old African-American female with history of peripheral vascular disease status right 1st toe amputation several years ago, hypertension, reflux, tobacco abuse disorder presents to the ER today with after being sent to the ER from the podiatry office.  She was seen the day before admission by podiatry.  She was started on p.o. antibiotics, Augmentin.Patient relates a 2-week history of worsening pain in her right toe initially.  She states that the toe was swollen and had a thick crust on it.  She states that she was treating it at home with alcohol, peroxide, Betadine baths.  This improved the patient's swelling and pain.  Patient has been using her right foot without difficulty.  She states that she drives and completes her activities of daily living without impediment.  Yesterday she was started on p.o. antibiotics.  She is only taken 1 dose.  She states that the toe was already improving this bite not being on antibiotics.  X-ray of the right foot was obtained in the office.  X-ray suggested possible osteomyelitis.  No comparison x-rays were performed.   On arrival to the ER, patient was afebrile.  Temperature 98.8.  Heart rate 72.  Blood pressure 164/65.  98% on room air. MRI of the right toe obtained: Ulceration at the distal aspect of the great toe with acute osteomyelitis of the great toe proximal phalanx. Small abscess which partially extends intraosseously within the proximal  phalanx  Patient was admitted to the hospital.  Podiatry and vascular surgery were consulted.   Acute osteomyelitis of right hallux with abscess . Was started on IV antibiotics initially. Vascular surgery podiatry was consulted. Patient underwent lower extremity angiography of the right. And then underwent status post Amputation right great toe MTPJ She was discharged on Augmentin x7 days Dressing should be left clean and dry.  Will change in office next week. WBAT in post op shoe.      PAD (peripheral artery disease) (HCC) Continue aspirin and Plavix      Essential hypertension Continue home meds   GERD (gastroesophageal reflux disease) Continue home meds   Tobacco use disorder Patient continues to smoke.   She has no intention of quitting. She was counseled on smoking cessation especially with her peripheral arterial disease.   Hyperlipidemia Continue home statin     Discharge Diagnoses:  Principal Problem:   Toe osteomyelitis, right (HCC) Active Problems:   Essential hypertension   PAD (peripheral artery disease) (HCC)   GERD (gastroesophageal reflux disease)   Tobacco use disorder   Hyperlipidemia    Discharge Instructions  Discharge Instructions     Call MD for:  severe uncontrolled pain   Complete by: As directed    Call MD for:  temperature >100.4   Complete by: As directed    Diet - low sodium heart healthy   Complete by: As directed    Discharge instructions   Complete by: As directed     Dressing should be left clean and dry.  Will change in office next week.  WBAT in  post op shoe.  F/u with podiatry dr. Vickki Muff  in 1 week.   Increase activity slowly   Complete by: As directed    Leave dressing on - Keep it clean, dry, and intact until clinic visit   Complete by: As directed       Allergies as of 02/19/2021       Reactions   Lisinopril-hydrochlorothiazide Other (See Comments)        Medication List     STOP taking these  medications    HYDROcodone-acetaminophen 5-325 MG tablet Commonly known as: Norco   naproxen sodium 220 MG tablet Commonly known as: ALEVE       TAKE these medications    Acetaminophen 500 MG capsule Take 1-2 tablets by mouth every 6 (six) hours as needed.   albuterol 108 (90 Base) MCG/ACT inhaler Commonly known as: VENTOLIN HFA Inhale 2 puffs into the lungs every 6 (six) hours as needed for wheezing or shortness of breath.   amLODipine 5 MG tablet Commonly known as: NORVASC Take 5 mg by mouth daily.   amoxicillin-clavulanate 875-125 MG tablet Commonly known as: Augmentin Take 1 tablet by mouth every 12 (twelve) hours for 7 days. What changed: when to take this   aspirin EC 81 MG tablet Take 1 tablet (81 mg total) by mouth daily.   cetirizine 5 MG tablet Commonly known as: ZYRTEC Take 5 mg by mouth daily.   clopidogrel 75 MG tablet Commonly known as: Plavix Take 1 tablet (75 mg total) by mouth daily.   Fusion Plus Caps Take 1 tablet by mouth daily with breakfast.   gabapentin 300 MG capsule Commonly known as: NEURONTIN Take 1-2 capsules by mouth 2 (two) times daily. 1 capsule qam  2 capsules qhs   ipratropium-albuterol 0.5-2.5 (3) MG/3ML Soln Commonly known as: DUONEB Take 3 mLs by nebulization every 4 (four) hours as needed.   nicotine 14 mg/24hr patch Commonly known as: NICODERM CQ - dosed in mg/24 hours Place 1 patch (14 mg total) onto the skin daily.   oxyCODONE 5 MG immediate release tablet Commonly known as: Oxy IR/ROXICODONE Take 1 tablet (5 mg total) by mouth every 4 (four) hours as needed for up to 3 days for moderate pain.   pantoprazole 40 MG tablet Commonly known as: PROTONIX Take 40 mg by mouth every morning.   rosuvastatin 20 MG tablet Commonly known as: CRESTOR Take 20 mg by mouth daily.               Discharge Care Instructions  (From admission, onward)           Start     Ordered   02/19/21 0000  Leave dressing on -  Keep it clean, dry, and intact until clinic visit        02/19/21 1449            Follow-up Information     Schnier, Dolores Lory, MD Follow up in 1 month(s).   Specialties: Vascular Surgery, Cardiology, Radiology, Vascular Surgery Why: Can see Dew or Arna Medici.  PAD.  Will need ABI with visit. Contact information: [place] [address] [phone]         Samara Deist, DPM Follow up in 1 week(s).   Specialty: Podiatry Contact information: [place] [address] [phone]         Lavera Guise, MD Follow up in 1 week(s).   Specialties: Internal Medicine, Cardiology Contact information: [place]  [place] agency Follow up.   Why: They will call to set up your initial visit.  PT may not be able to go out until next week.               Allergies  Allergen Reactions   Lisinopril-Hydrochlorothiazide Other (See Comments)    Consultations: Vascular surgery and podiatry   Procedures/Studies: PERIPHERAL VASCULAR CATHETERIZATION  Result Date: 02/18/2021 See surgical note for result.  MR TOES RIGHT W WO CONTRAST  Result Date: 02/14/2021 CLINICAL DATA:  Osteomyelitis. History of partial great toe amputation EXAM: MRI OF THE RIGHT TOES WITHOUT AND WITH CONTRAST TECHNIQUE: Multiplanar, multisequence MR imaging of the right forefoot was performed both before and after administration of intravenous contrast. CONTRAST:  75mL GADAVIST GADOBUTROL 1 MMOL/ML IV SOLN COMPARISON:  X-ray 02/14/2021 FINDINGS: Bones/Joint/Cartilage Status post resection of the great toe distal phalanx. There is a large erosion involving the distal aspect of the great toe proximal phalanx with marked bone marrow edema and confluent low T1 signal throughout the proximal phalanx compatible with acute osteomyelitis. Preservation of the bone marrow signal within the first metatarsal and hallux  sesamoids. Remaining osseous structures of the forefoot are intact. No additional sites of erosion or marrow replacement. No fracture or dislocation. Ligaments Intact Lisfranc ligament. Collateral ligaments of the forefoot are intact. Muscles and Tendons No acute musculotendinous abnormality. Soft tissues Ulceration at the distal aspect of the great toe with small underlying rim enhancing fluid collection measuring up to 14 mm. Collection partially extends into the medullary space of the distal phalanx. Prominent surrounding soft tissue edema and enhancement compatible with cellulitis. IMPRESSION: Ulceration at the distal aspect of the great toe with acute osteomyelitis of the great toe proximal phalanx. Small abscess which partially extends intraosseously within the proximal phalanx. Electronically Signed   By: Davina Poke D.O.   On: 02/14/2021 20:02   DG Foot Complete Right  Result Date: 02/14/2021 CLINICAL DATA:  Pain. Osteomyelitis. Drainage. Wound infection a right great toe. History of amputation of great toe. EXAM: RIGHT FOOT COMPLETE - 3+ VIEW COMPARISON:  None. FINDINGS: The patient is status post partial amputation of the great toe. There is lucency in the distal aspect of the remaining proximal phalanx. IMPRESSION: 1. The patient is status post partial amputation of the great toe. There is lucency in the distal aspect of the remaining proximal phalanx. While it is possible the deformity of the remaining proximal phalanx could be due to previous amputation, the irregularity and lucency is most suggestive of osteomyelitis. If the clinical picture is ambiguous, MRI could better evaluate. Electronically Signed   By: Dorise Bullion III M.D.   On: 02/14/2021 15:08      Subjective:  No shortness of breath, chest pain, abdominal pain.  Would like to go home.  Pain controlled. Discharge Exam: Vitals:   02/19/21 1136 02/19/21 1542  BP: (!) 144/60 102/85  Pulse: 68 75  Resp: 18 18  Temp: 98.2  F (36.8 C) 98 F (36.7 C)  SpO2: 98% 99%   Vitals:   02/19/21 0600 02/19/21 0843 02/19/21 1136 02/19/21 1542  BP:  126/62 (!) 144/60 102/85  Pulse:  63 68 75  Resp:  18 18 18   Temp:  (!) 97.5 F (36.4 C) 98.2 F (36.8 C) 98 F (36.7 C)  TempSrc:  Oral Oral   SpO2:  100% 98% 99%  Weight: 104 kg  Height:        General: Pt is alert, awake, not in acute distress Cardiovascular: RRR, S1/S2 +, no rubs, no gallops Respiratory: CTA bilaterally, no wheezing, no rhonchi Abdominal: Soft, NT, ND, bowel sounds + Extremities: no edema, right foot dressing in place    The results of significant diagnostics from this hospitalization (including imaging, microbiology, ancillary and laboratory) are listed below for reference.     Microbiology: Recent Results (from the past 240 hour(s))  Resp Panel by RT-PCR (Flu A&B, Covid) Nasopharyngeal Swab     Status: None   Collection Time: 02/14/21  5:39 PM   Specimen: Nasopharyngeal Swab; Nasopharyngeal(NP) swabs in vial transport medium  Result Value Ref Range Status   SARS Coronavirus 2 by RT PCR NEGATIVE NEGATIVE Final    Comment: (NOTE) SARS-CoV-2 target nucleic acids are NOT DETECTED.  The SARS-CoV-2 RNA is generally detectable in upper respiratory specimens during the acute phase of infection. The lowest concentration of SARS-CoV-2 viral copies this assay can detect is 138 copies/mL. A negative result does not preclude SARS-Cov-2 infection and should not be used as the sole basis for treatment or other patient management decisions. A negative result may occur with  improper specimen collection/handling, submission of specimen other than nasopharyngeal swab, presence of viral mutation(s) within the areas targeted by this assay, and inadequate number of viral copies(<138 copies/mL). A negative result must be combined with clinical observations, patient history, and epidemiological information. The expected result is Negative.  Fact  Sheet for Patients:  EntrepreneurPulse.com.au  Fact Sheet for Healthcare Providers:  IncredibleEmployment.be  This test is no t yet approved or cleared by the [place] FDA and  has been authorized for detection and/or diagnosis of SARS-CoV-2 by FDA under an Emergency Use Authorization (EUA). This EUA will remain  in effect (meaning this test can be used) for the duration of the COVID-19 declaration under Section 564(b)(1) of the Act, 21 U.S.C.section 360bbb-3(b)(1), unless the authorization is terminated  or revoked sooner.       Influenza A by PCR NEGATIVE NEGATIVE Final   Influenza B by PCR NEGATIVE NEGATIVE Final    Comment: (NOTE) The Xpert Xpress SARS-CoV-2/FLU/RSV plus assay is intended as an aid in the diagnosis of influenza from Nasopharyngeal swab specimens and should not be used as a sole basis for treatment. Nasal washings and aspirates are unacceptable for Xpert Xpress SARS-CoV-2/FLU/RSV testing.  Fact Sheet for Patients: EntrepreneurPulse.com.au  Fact Sheet for Healthcare Providers: IncredibleEmployment.be  This test is not yet approved or cleared by the [place] FDA and has been authorized for detection and/or diagnosis of SARS-CoV-2 by FDA under an Emergency Use Authorization (EUA). This EUA will remain in effect (meaning this test can be used) for the duration of the COVID-19 declaration under Section 564(b)(1) of the Act, 21 U.S.C. section 360bbb-3(b)(1), unless the authorization is terminated or revoked.  Performed at [facility], [address] [place], [place] [address]   Blood culture (routine x 2)     Status: None   Collection Time: 02/14/21  9:09 PM   Specimen: BLOOD  Result Value Ref Range Status   Specimen Description BLOOD LEFT ANTECUBITAL  Final   Special Requests   Final    BOTTLES DRAWN AEROBIC AND ANAEROBIC Blood Culture adequate volume   Culture    Final    NO GROWTH 5 DAYS Performed at [facility], [address], [place], [place] [address]    Report Status 02/19/2021 FINAL  Final  Blood culture (routine x 2)     Status: None   Collection Time: 02/14/21  9:19 PM   Specimen: BLOOD  Result Value Ref Range Status   Specimen Description BLOOD BLOOD LEFT HAND  Final   Special Requests   Final    BOTTLES DRAWN AEROBIC AND ANAEROBIC Blood Culture adequate volume   Culture   Final    NO GROWTH 5 DAYS Performed at [facility], [address] [place], [place] [address]    Report Status 02/19/2021 FINAL  Final  Aerobic/Anaerobic Culture w Gram Stain (surgical/deep wound)     Status: None   Collection Time: 02/15/21 10:59 AM   Specimen: Toe; Wound  Result Value Ref Range Status   Specimen Description   Final    TOE LEFT Performed at [facility], [address] [place], [place] [address]    Special Requests   Final    NONE Performed at [facility], [address] [place], [place] [address]    Gram Stain   Final    NO SQUAMOUS EPITHELIAL CELLS SEEN FEW WBC SEEN NO ORGANISMS SEEN    Culture   Final    RARE STAPHYLOCOCCUS SIMULANS RARE STAPHYLOCOCCUS CAPITIS NO ANAEROBES ISOLATED Performed at [facility], [place] [address] [place],  [address]    Report Status 02/20/2021 FINAL  Final   Organism ID, Bacteria STAPHYLOCOCCUS SIMULANS  Final   Organism ID, Bacteria STAPHYLOCOCCUS CAPITIS  Final      Susceptibility   Staphylococcus capitis - MIC*    CIPROFLOXACIN <=0.5 SENSITIVE Sensitive     ERYTHROMYCIN 0.5 SENSITIVE Sensitive     GENTAMICIN <=0.5 SENSITIVE Sensitive     OXACILLIN <=0.25 SENSITIVE Sensitive     TETRACYCLINE 2 SENSITIVE Sensitive     VANCOMYCIN 1 SENSITIVE Sensitive     TRIMETH/SULFA <=10 SENSITIVE Sensitive     RIFAMPIN <=0.5 SENSITIVE Sensitive     * RARE STAPHYLOCOCCUS CAPITIS   Staphylococcus simulans - MIC*    CIPROFLOXACIN <=0.5 SENSITIVE  Sensitive     ERYTHROMYCIN <=0.25 SENSITIVE Sensitive     GENTAMICIN <=0.5 SENSITIVE Sensitive     OXACILLIN <=0.25 SENSITIVE Sensitive     TETRACYCLINE <=1 SENSITIVE Sensitive     VANCOMYCIN <=0.5 SENSITIVE Sensitive     TRIMETH/SULFA <=10 SENSITIVE Sensitive     CLINDAMYCIN <=0.25 SENSITIVE Sensitive     RIFAMPIN <=0.5 SENSITIVE Sensitive     Inducible Clindamycin NEGATIVE Sensitive     * RARE STAPHYLOCOCCUS SIMULANS     Labs: BNP (last 3 results) No results for input(s): BNP in the last 8760 hours. Basic Metabolic Panel: Recent Labs  Lab 02/14/21 1407 02/15/21 0601 02/16/21 0513 02/17/21 0420 02/18/21 0455 02/19/21 0835  NA 139 136  --  135 134*  --   K 3.6 3.7  --  4.0 3.9  --   CL 107 106  --  105 104  --   CO2 27 25  --  22 24  --   GLUCOSE 103* 121*  --  111* 108*  --   BUN 13 13  --  15 14  --   CREATININE 0.63 0.65 0.74 0.61 0.73 0.66  CALCIUM 9.2 8.8*  --  9.0 9.2  --    Liver Function Tests: Recent Labs  Lab 02/14/21 1407 02/15/21 0601  AST 16 15  ALT 9 8  ALKPHOS 56 49  BILITOT 0.6 0.5  PROT 7.7 6.6  ALBUMIN 3.5 3.0*   No  results for input(s): LIPASE, AMYLASE in the last 168 hours. No results for input(s): AMMONIA in the last 168 hours. CBC: Recent Labs  Lab 02/14/21 1407 02/15/21 0601 02/17/21 0420 02/18/21 0455 02/19/21 0835  WBC 5.3 4.5 5.3 4.8 5.3  NEUTROABS 2.6 2.0  --   --   --   HGB 10.7* 10.3* 10.6* 10.5* 10.8*  HCT 33.0* 32.4* 31.7* 31.5* 33.3*  MCV 100.0 99.4 97.5 95.7 97.4  PLT 340 286 308 329 309   Cardiac Enzymes: No results for input(s): CKTOTAL, CKMB, CKMBINDEX, TROPONINI in the last 168 hours. BNP: Invalid input(s): POCBNP CBG: Recent Labs  Lab 02/17/21 1125 02/17/21 2156 02/18/21 0804 02/19/21 1207 02/19/21 1542  GLUCAP 78 94 90 116* 104*   D-Dimer No results for input(s): DDIMER in the last 72 hours. Hgb A1c No results for input(s): HGBA1C in the last 72 hours. Lipid Profile No results for input(s): CHOL,  HDL, LDLCALC, TRIG, CHOLHDL, LDLDIRECT in the last 72 hours. Thyroid function studies No results for input(s): TSH, T4TOTAL, T3FREE, THYROIDAB in the last 72 hours.  Invalid input(s): FREET3 Anemia work up No results for input(s): VITAMINB12, FOLATE, FERRITIN, TIBC, IRON, RETICCTPCT in the last 72 hours. Urinalysis    Component Value Date/Time   COLORURINE YELLOW (A) 01/05/2017 1328   APPEARANCEUR CLEAR (A) 01/05/2017 1328   LABSPEC 1.021 01/05/2017 1328   PHURINE 6.0 01/05/2017 1328   GLUCOSEU NEGATIVE 01/05/2017 1328   HGBUR SMALL (A) 01/05/2017 1328   BILIRUBINUR NEGATIVE 01/05/2017 [place] 01/05/2017 1328   PROTEINUR NEGATIVE 01/05/2017 1328   NITRITE NEGATIVE 01/05/2017 1328   LEUKOCYTESUR NEGATIVE 01/05/2017 1328   Sepsis Labs Invalid input(s): PROCALCITONIN,  WBC,  LACTICIDVEN Microbiology Recent Results (from the past 240 hour(s))  Resp Panel by RT-PCR (Flu A&B, Covid) Nasopharyngeal Swab     Status: None   Collection Time: 02/14/21  5:39 PM   Specimen: Nasopharyngeal Swab; Nasopharyngeal(NP) swabs in vial transport medium  Result Value Ref Range Status   SARS Coronavirus 2 by RT PCR NEGATIVE NEGATIVE Final    Comment: (NOTE) SARS-CoV-2 target nucleic acids are NOT DETECTED.  The SARS-CoV-2 RNA is generally detectable in upper respiratory specimens during the acute phase of infection. The lowest concentration of SARS-CoV-2 viral copies this assay can detect is 138 copies/mL. A negative result does not preclude SARS-Cov-2 infection and should not be used as the sole basis for treatment or other patient management decisions. A negative result may occur with  improper specimen collection/handling, submission of specimen other than nasopharyngeal swab, presence of viral mutation(s) within the areas targeted by this assay, and inadequate number of viral copies(<138 copies/mL). A negative result must be combined with clinical observations, patient  history, and epidemiological information. The expected result is Negative.  Fact Sheet for Patients:  EntrepreneurPulse.com.au  Fact Sheet for Healthcare Providers:  IncredibleEmployment.be  This test is no t yet approved or cleared by the [place] FDA and  has been authorized for detection and/or diagnosis of SARS-CoV-2 by FDA under an Emergency Use Authorization (EUA). This EUA will remain  in effect (meaning this test can be used) for the duration of the COVID-19 declaration under Section 564(b)(1) of the Act, 21 U.S.C.section 360bbb-3(b)(1), unless the authorization is terminated  or revoked sooner.       Influenza A by PCR NEGATIVE NEGATIVE Final   Influenza B by PCR NEGATIVE NEGATIVE Final    Comment: (NOTE) The Xpert Xpress SARS-CoV-2/FLU/RSV plus assay is intended as an  aid in the diagnosis of influenza from Nasopharyngeal swab specimens and should not be used as a sole basis for treatment. Nasal washings and aspirates are unacceptable for Xpert Xpress SARS-CoV-2/FLU/RSV testing.  Fact Sheet for Patients: EntrepreneurPulse.com.au  Fact Sheet for Healthcare Providers: IncredibleEmployment.be  This test is not yet approved or cleared by the [place] FDA and has been authorized for detection and/or diagnosis of SARS-CoV-2 by FDA under an Emergency Use Authorization (EUA). This EUA will remain in effect (meaning this test can be used) for the duration of the COVID-19 declaration under Section 564(b)(1) of the Act, 21 U.S.C. section 360bbb-3(b)(1), unless the authorization is terminated or revoked.  Performed at [facility], [address] [place], [place] [address]   Blood culture (routine x 2)     Status: None   Collection Time: 02/14/21  9:09 PM   Specimen: BLOOD  Result Value Ref Range Status   Specimen Description BLOOD LEFT ANTECUBITAL  Final   Special Requests    Final    BOTTLES DRAWN AEROBIC AND ANAEROBIC Blood Culture adequate volume   Culture   Final    NO GROWTH 5 DAYS Performed at [facility], [address] [place], [place] [address]    Report Status 02/19/2021 FINAL  Final  Blood culture (routine x 2)     Status: None   Collection Time: 02/14/21  9:19 PM   Specimen: BLOOD  Result Value Ref Range Status   Specimen Description BLOOD BLOOD LEFT HAND  Final   Special Requests   Final    BOTTLES DRAWN AEROBIC AND ANAEROBIC Blood Culture adequate volume   Culture   Final    NO GROWTH 5 DAYS Performed at [facility], [address], [place], [place] [address]    Report Status 02/19/2021 FINAL  Final  Aerobic/Anaerobic Culture w Gram Stain (surgical/deep wound)     Status: None   Collection Time: 02/15/21 10:59 AM   Specimen: Toe; Wound  Result Value Ref Range Status   Specimen Description   Final    TOE LEFT Performed at [facility], [address], [place], [place] [address]    Special Requests   Final    NONE Performed at [facility], [place], [place] [address]    Gram Stain   Final    NO SQUAMOUS EPITHELIAL CELLS SEEN FEW WBC SEEN NO ORGANISMS SEEN    Culture   Final    RARE STAPHYLOCOCCUS SIMULANS RARE STAPHYLOCOCCUS CAPITIS NO ANAEROBES ISOLATED Performed at [facility], [place] [address], [place], [place] [address]    Report Status 02/20/2021 FINAL  Final   Organism ID, Bacteria STAPHYLOCOCCUS SIMULANS  Final   Organism ID, Bacteria STAPHYLOCOCCUS CAPITIS  Final      Susceptibility   Staphylococcus capitis - MIC*    CIPROFLOXACIN <=0.5 SENSITIVE Sensitive     ERYTHROMYCIN 0.5 SENSITIVE Sensitive     GENTAMICIN <=0.5 SENSITIVE Sensitive     OXACILLIN <=0.25 SENSITIVE Sensitive     TETRACYCLINE 2 SENSITIVE Sensitive     VANCOMYCIN 1 SENSITIVE Sensitive     TRIMETH/SULFA <=10 SENSITIVE Sensitive     RIFAMPIN <=0.5 SENSITIVE Sensitive     * RARE  STAPHYLOCOCCUS CAPITIS   Staphylococcus simulans - MIC*    CIPROFLOXACIN <=0.5 SENSITIVE Sensitive     ERYTHROMYCIN <=0.25 SENSITIVE Sensitive     GENTAMICIN <=0.5 SENSITIVE Sensitive     OXACILLIN <=0.25 SENSITIVE Sensitive     TETRACYCLINE <=  1 SENSITIVE Sensitive     VANCOMYCIN <=0.5 SENSITIVE Sensitive     TRIMETH/SULFA <=10 SENSITIVE Sensitive     CLINDAMYCIN <=0.25 SENSITIVE Sensitive     RIFAMPIN <=0.5 SENSITIVE Sensitive     Inducible Clindamycin NEGATIVE Sensitive     * RARE STAPHYLOCOCCUS SIMULANS     Time coordinating discharge: Over 30 minutes  SIGNED:   Nolberto Hanlon, MD  [facility] 02/20/2021, 3:11 PM Pager   If 7PM-7AM, please contact night-coverage [email or web address] Password [email or web address]

## 2021-02-20 LAB — AEROBIC/ANAEROBIC CULTURE W GRAM STAIN (SURGICAL/DEEP WOUND): Gram Stain: NONE SEEN

## 2021-02-23 ENCOUNTER — Encounter: Payer: Self-pay | Admitting: Vascular Surgery

## 2021-02-24 LAB — SURGICAL PATHOLOGY

## 2021-03-05 DIAGNOSIS — I96 Gangrene, not elsewhere classified: Secondary | ICD-10-CM | POA: Diagnosis not present

## 2021-03-05 DIAGNOSIS — Z89421 Acquired absence of other right toe(s): Secondary | ICD-10-CM | POA: Diagnosis not present

## 2021-03-05 DIAGNOSIS — I739 Peripheral vascular disease, unspecified: Secondary | ICD-10-CM | POA: Diagnosis not present

## 2021-03-19 DIAGNOSIS — M86171 Other acute osteomyelitis, right ankle and foot: Secondary | ICD-10-CM | POA: Diagnosis not present

## 2021-03-19 DIAGNOSIS — Z89421 Acquired absence of other right toe(s): Secondary | ICD-10-CM | POA: Diagnosis not present

## 2021-03-19 DIAGNOSIS — L97512 Non-pressure chronic ulcer of other part of right foot with fat layer exposed: Secondary | ICD-10-CM | POA: Diagnosis not present

## 2021-03-19 DIAGNOSIS — I739 Peripheral vascular disease, unspecified: Secondary | ICD-10-CM | POA: Diagnosis not present

## 2021-03-20 DIAGNOSIS — G5601 Carpal tunnel syndrome, right upper limb: Secondary | ICD-10-CM | POA: Diagnosis not present

## 2021-03-20 DIAGNOSIS — E782 Mixed hyperlipidemia: Secondary | ICD-10-CM | POA: Diagnosis not present

## 2021-03-20 DIAGNOSIS — I739 Peripheral vascular disease, unspecified: Secondary | ICD-10-CM | POA: Diagnosis not present

## 2021-03-20 DIAGNOSIS — I10 Essential (primary) hypertension: Secondary | ICD-10-CM | POA: Diagnosis not present

## 2021-04-09 DIAGNOSIS — I739 Peripheral vascular disease, unspecified: Secondary | ICD-10-CM | POA: Diagnosis not present

## 2021-04-09 DIAGNOSIS — L97512 Non-pressure chronic ulcer of other part of right foot with fat layer exposed: Secondary | ICD-10-CM | POA: Diagnosis not present

## 2021-04-30 DIAGNOSIS — E119 Type 2 diabetes mellitus without complications: Secondary | ICD-10-CM | POA: Diagnosis not present

## 2021-04-30 DIAGNOSIS — Z89421 Acquired absence of other right toe(s): Secondary | ICD-10-CM | POA: Diagnosis not present

## 2021-04-30 DIAGNOSIS — I739 Peripheral vascular disease, unspecified: Secondary | ICD-10-CM | POA: Diagnosis not present

## 2021-04-30 DIAGNOSIS — L97512 Non-pressure chronic ulcer of other part of right foot with fat layer exposed: Secondary | ICD-10-CM | POA: Diagnosis not present

## 2021-04-30 DIAGNOSIS — M86171 Other acute osteomyelitis, right ankle and foot: Secondary | ICD-10-CM | POA: Diagnosis not present

## 2021-05-18 DIAGNOSIS — M5459 Other low back pain: Secondary | ICD-10-CM | POA: Diagnosis not present

## 2021-05-18 DIAGNOSIS — M170 Bilateral primary osteoarthritis of knee: Secondary | ICD-10-CM | POA: Diagnosis not present

## 2021-05-18 DIAGNOSIS — M5416 Radiculopathy, lumbar region: Secondary | ICD-10-CM | POA: Diagnosis not present

## 2021-05-19 DIAGNOSIS — F40240 Claustrophobia: Secondary | ICD-10-CM | POA: Insufficient documentation

## 2021-06-04 DIAGNOSIS — M86171 Other acute osteomyelitis, right ankle and foot: Secondary | ICD-10-CM | POA: Diagnosis not present

## 2021-06-04 DIAGNOSIS — I739 Peripheral vascular disease, unspecified: Secondary | ICD-10-CM | POA: Diagnosis not present

## 2021-06-04 DIAGNOSIS — B351 Tinea unguium: Secondary | ICD-10-CM | POA: Diagnosis not present

## 2021-06-04 DIAGNOSIS — E119 Type 2 diabetes mellitus without complications: Secondary | ICD-10-CM | POA: Diagnosis not present

## 2021-06-04 DIAGNOSIS — Z89421 Acquired absence of other right toe(s): Secondary | ICD-10-CM | POA: Diagnosis not present

## 2021-06-04 DIAGNOSIS — R600 Localized edema: Secondary | ICD-10-CM | POA: Diagnosis not present

## 2021-06-24 DIAGNOSIS — M5459 Other low back pain: Secondary | ICD-10-CM | POA: Diagnosis not present

## 2021-06-26 DIAGNOSIS — M5416 Radiculopathy, lumbar region: Secondary | ICD-10-CM | POA: Diagnosis not present

## 2021-07-14 DIAGNOSIS — M170 Bilateral primary osteoarthritis of knee: Secondary | ICD-10-CM | POA: Diagnosis not present

## 2021-08-04 DIAGNOSIS — H35033 Hypertensive retinopathy, bilateral: Secondary | ICD-10-CM | POA: Diagnosis not present

## 2021-08-04 DIAGNOSIS — H524 Presbyopia: Secondary | ICD-10-CM | POA: Diagnosis not present

## 2021-08-04 DIAGNOSIS — Z0100 Encounter for examination of eyes and vision without abnormal findings: Secondary | ICD-10-CM | POA: Diagnosis not present

## 2021-08-18 DIAGNOSIS — E785 Hyperlipidemia, unspecified: Secondary | ICD-10-CM | POA: Diagnosis not present

## 2021-08-18 DIAGNOSIS — G629 Polyneuropathy, unspecified: Secondary | ICD-10-CM | POA: Diagnosis not present

## 2021-08-18 DIAGNOSIS — J449 Chronic obstructive pulmonary disease, unspecified: Secondary | ICD-10-CM | POA: Diagnosis not present

## 2021-08-18 DIAGNOSIS — E782 Mixed hyperlipidemia: Secondary | ICD-10-CM | POA: Diagnosis not present

## 2021-08-18 DIAGNOSIS — M109 Gout, unspecified: Secondary | ICD-10-CM | POA: Diagnosis not present

## 2021-08-18 DIAGNOSIS — K219 Gastro-esophageal reflux disease without esophagitis: Secondary | ICD-10-CM | POA: Diagnosis not present

## 2021-08-18 DIAGNOSIS — I10 Essential (primary) hypertension: Secondary | ICD-10-CM | POA: Diagnosis not present

## 2021-08-18 DIAGNOSIS — I739 Peripheral vascular disease, unspecified: Secondary | ICD-10-CM | POA: Diagnosis not present

## 2021-08-19 DIAGNOSIS — M5416 Radiculopathy, lumbar region: Secondary | ICD-10-CM | POA: Diagnosis not present

## 2021-09-04 DIAGNOSIS — M5416 Radiculopathy, lumbar region: Secondary | ICD-10-CM | POA: Diagnosis not present

## 2021-11-26 DIAGNOSIS — E782 Mixed hyperlipidemia: Secondary | ICD-10-CM | POA: Diagnosis not present

## 2021-11-26 DIAGNOSIS — J449 Chronic obstructive pulmonary disease, unspecified: Secondary | ICD-10-CM | POA: Diagnosis not present

## 2021-11-26 DIAGNOSIS — I10 Essential (primary) hypertension: Secondary | ICD-10-CM | POA: Diagnosis not present

## 2021-11-26 DIAGNOSIS — R7303 Prediabetes: Secondary | ICD-10-CM | POA: Diagnosis not present

## 2021-11-26 DIAGNOSIS — E559 Vitamin D deficiency, unspecified: Secondary | ICD-10-CM | POA: Diagnosis not present

## 2021-11-26 DIAGNOSIS — D519 Vitamin B12 deficiency anemia, unspecified: Secondary | ICD-10-CM | POA: Diagnosis not present

## 2021-11-26 DIAGNOSIS — G4452 New daily persistent headache (NDPH): Secondary | ICD-10-CM | POA: Diagnosis not present

## 2021-11-26 DIAGNOSIS — M109 Gout, unspecified: Secondary | ICD-10-CM | POA: Diagnosis not present

## 2021-11-26 DIAGNOSIS — Z0000 Encounter for general adult medical examination without abnormal findings: Secondary | ICD-10-CM | POA: Diagnosis not present

## 2021-12-01 ENCOUNTER — Other Ambulatory Visit: Payer: Self-pay | Admitting: Family

## 2021-12-01 ENCOUNTER — Other Ambulatory Visit: Payer: Self-pay | Admitting: Nurse Practitioner

## 2021-12-01 DIAGNOSIS — Z1231 Encounter for screening mammogram for malignant neoplasm of breast: Secondary | ICD-10-CM

## 2021-12-02 ENCOUNTER — Other Ambulatory Visit: Payer: Self-pay | Admitting: Family

## 2021-12-02 DIAGNOSIS — R519 Headache, unspecified: Secondary | ICD-10-CM

## 2021-12-08 DIAGNOSIS — R600 Localized edema: Secondary | ICD-10-CM | POA: Diagnosis not present

## 2021-12-08 DIAGNOSIS — M79675 Pain in left toe(s): Secondary | ICD-10-CM | POA: Diagnosis not present

## 2021-12-08 DIAGNOSIS — B351 Tinea unguium: Secondary | ICD-10-CM | POA: Diagnosis not present

## 2021-12-08 DIAGNOSIS — Z89421 Acquired absence of other right toe(s): Secondary | ICD-10-CM | POA: Diagnosis not present

## 2021-12-08 DIAGNOSIS — M79674 Pain in right toe(s): Secondary | ICD-10-CM | POA: Diagnosis not present

## 2021-12-08 DIAGNOSIS — L851 Acquired keratosis [keratoderma] palmaris et plantaris: Secondary | ICD-10-CM | POA: Diagnosis not present

## 2021-12-08 DIAGNOSIS — I739 Peripheral vascular disease, unspecified: Secondary | ICD-10-CM | POA: Diagnosis not present

## 2021-12-16 ENCOUNTER — Ambulatory Visit
Admission: RE | Admit: 2021-12-16 | Discharge: 2021-12-16 | Disposition: A | Discharge status: Home / Self Care | Payer: Medicare HMO | Source: Ambulatory Visit | Attending: Family | Admitting: Family

## 2021-12-16 DIAGNOSIS — R519 Headache, unspecified: Secondary | ICD-10-CM | POA: Insufficient documentation

## 2021-12-25 ENCOUNTER — Ambulatory Visit
Admission: RE | Admit: 2021-12-25 | Discharge: 2021-12-25 | Disposition: A | Discharge status: Home / Self Care | Payer: Medicare HMO | Source: Ambulatory Visit | Attending: Family | Admitting: Family

## 2021-12-25 DIAGNOSIS — Z1231 Encounter for screening mammogram for malignant neoplasm of breast: Secondary | ICD-10-CM | POA: Insufficient documentation

## 2021-12-28 ENCOUNTER — Other Ambulatory Visit: Payer: Self-pay | Admitting: Nurse Practitioner

## 2021-12-28 ENCOUNTER — Other Ambulatory Visit: Payer: Self-pay | Admitting: Family

## 2021-12-28 DIAGNOSIS — N6489 Other specified disorders of breast: Secondary | ICD-10-CM

## 2021-12-28 DIAGNOSIS — R928 Other abnormal and inconclusive findings on diagnostic imaging of breast: Secondary | ICD-10-CM

## 2022-01-07 DIAGNOSIS — L03032 Cellulitis of left toe: Secondary | ICD-10-CM | POA: Diagnosis not present

## 2022-01-07 DIAGNOSIS — E119 Type 2 diabetes mellitus without complications: Secondary | ICD-10-CM | POA: Diagnosis not present

## 2022-01-07 DIAGNOSIS — I739 Peripheral vascular disease, unspecified: Secondary | ICD-10-CM | POA: Diagnosis not present

## 2022-01-18 DIAGNOSIS — N3946 Mixed incontinence: Secondary | ICD-10-CM | POA: Diagnosis not present

## 2022-01-18 DIAGNOSIS — N3281 Overactive bladder: Secondary | ICD-10-CM | POA: Diagnosis not present

## 2022-01-21 DIAGNOSIS — E119 Type 2 diabetes mellitus without complications: Secondary | ICD-10-CM | POA: Diagnosis not present

## 2022-01-21 DIAGNOSIS — I739 Peripheral vascular disease, unspecified: Secondary | ICD-10-CM | POA: Diagnosis not present

## 2022-01-21 DIAGNOSIS — L03032 Cellulitis of left toe: Secondary | ICD-10-CM | POA: Diagnosis not present

## 2022-01-26 ENCOUNTER — Ambulatory Visit
Admission: RE | Admit: 2022-01-26 | Discharge: 2022-01-26 | Disposition: A | Discharge status: Home / Self Care | Payer: Medicare HMO | Source: Ambulatory Visit | Attending: Family | Admitting: Family

## 2022-01-26 DIAGNOSIS — N6489 Other specified disorders of breast: Secondary | ICD-10-CM | POA: Diagnosis not present

## 2022-01-26 DIAGNOSIS — R928 Other abnormal and inconclusive findings on diagnostic imaging of breast: Secondary | ICD-10-CM | POA: Insufficient documentation

## 2022-02-04 DIAGNOSIS — L03032 Cellulitis of left toe: Secondary | ICD-10-CM | POA: Diagnosis not present

## 2022-02-04 DIAGNOSIS — I739 Peripheral vascular disease, unspecified: Secondary | ICD-10-CM | POA: Diagnosis not present

## 2022-02-04 DIAGNOSIS — E119 Type 2 diabetes mellitus without complications: Secondary | ICD-10-CM | POA: Diagnosis not present

## 2022-02-15 ENCOUNTER — Other Ambulatory Visit (INDEPENDENT_AMBULATORY_CARE_PROVIDER_SITE_OTHER): Payer: Self-pay | Admitting: Vascular Surgery

## 2022-02-15 ENCOUNTER — Encounter (INDEPENDENT_AMBULATORY_CARE_PROVIDER_SITE_OTHER): Payer: Medicare HMO

## 2022-02-15 ENCOUNTER — Ambulatory Visit (INDEPENDENT_AMBULATORY_CARE_PROVIDER_SITE_OTHER): Payer: Medicare HMO | Admitting: Vascular Surgery

## 2022-02-15 DIAGNOSIS — I739 Peripheral vascular disease, unspecified: Secondary | ICD-10-CM

## 2022-02-25 DIAGNOSIS — L03032 Cellulitis of left toe: Secondary | ICD-10-CM | POA: Diagnosis not present

## 2022-02-25 DIAGNOSIS — E119 Type 2 diabetes mellitus without complications: Secondary | ICD-10-CM | POA: Diagnosis not present

## 2022-02-25 DIAGNOSIS — I739 Peripheral vascular disease, unspecified: Secondary | ICD-10-CM | POA: Diagnosis not present

## 2022-03-12 DIAGNOSIS — J449 Chronic obstructive pulmonary disease, unspecified: Secondary | ICD-10-CM | POA: Diagnosis not present

## 2022-03-12 DIAGNOSIS — M5441 Lumbago with sciatica, right side: Secondary | ICD-10-CM | POA: Diagnosis not present

## 2022-03-12 DIAGNOSIS — M179 Osteoarthritis of knee, unspecified: Secondary | ICD-10-CM | POA: Diagnosis not present

## 2022-03-12 DIAGNOSIS — I10 Essential (primary) hypertension: Secondary | ICD-10-CM | POA: Diagnosis not present

## 2022-03-12 DIAGNOSIS — W0110XA Fall on same level from slipping, tripping and stumbling with subsequent striking against unspecified object, initial encounter: Secondary | ICD-10-CM | POA: Diagnosis not present

## 2022-03-12 DIAGNOSIS — E782 Mixed hyperlipidemia: Secondary | ICD-10-CM | POA: Diagnosis not present

## 2022-03-18 DIAGNOSIS — I739 Peripheral vascular disease, unspecified: Secondary | ICD-10-CM | POA: Diagnosis not present

## 2022-03-18 DIAGNOSIS — L03032 Cellulitis of left toe: Secondary | ICD-10-CM | POA: Diagnosis not present

## 2022-03-19 ENCOUNTER — Ambulatory Visit
Admission: RE | Admit: 2022-03-19 | Discharge: 2022-03-19 | Disposition: A | Discharge status: Home / Self Care | Payer: Medicare HMO | Attending: Family | Admitting: Family

## 2022-03-19 ENCOUNTER — Ambulatory Visit
Admission: RE | Admit: 2022-03-19 | Discharge: 2022-03-19 | Disposition: A | Discharge status: Home / Self Care | Payer: Medicare HMO | Source: Ambulatory Visit | Attending: Family | Admitting: Family

## 2022-03-19 ENCOUNTER — Other Ambulatory Visit: Payer: Self-pay | Admitting: Family

## 2022-03-19 DIAGNOSIS — R0789 Other chest pain: Secondary | ICD-10-CM | POA: Diagnosis not present

## 2022-03-19 DIAGNOSIS — R0781 Pleurodynia: Secondary | ICD-10-CM | POA: Insufficient documentation

## 2022-03-23 NOTE — Progress Notes (Deleted)
MRN : [id number]  Jackie Macias is a 66 y.o. (23-May-1955) female who presents with chief complaint of check circulation.  History of Present Illness:  The patient returns to the office for followup and review status post angiogram with intervention on 02/18/2022.   Procedure:  Percutaneous transluminal angioplasty and stent placement right superficial femoral and popliteal arteries to 5 mm 2.    Percutaneous transluminal angioplasty distal tibial peroneal         trunk and proximal peroneal to 4 mm  The patient notes improvement in the lower extremity symptoms. No interval shortening of the patient's claudication distance or rest pain symptoms. No new ulcers or wounds have occurred since the last visit.  There have been no significant changes to the patient's overall health care.  No documented history of amaurosis fugax or recent TIA symptoms. There are no recent neurological changes noted. No documented history of DVT, PE or superficial thrombophlebitis. The patient denies recent episodes of angina or shortness of breath.   ABI's Rt=*** and Lt=***  (previous ABI's Rt=*** and Lt=***) Duplex US of the *** lower extremity arterial system shows ***   No outpatient medications have been marked as taking for the 04/01/22 encounter (Appointment) with Delana Meyer, Dolores Lory, MD.    Past Medical History:  Diagnosis Date   Arthritis    Bronchitis    COPD (chronic obstructive pulmonary disease) ([place])    GERD (gastroesophageal reflux disease)    Gout    Hypertension     Past Surgical History:  Procedure Laterality Date   AMPUTATION Right 07/29/2017   Procedure: AMPUTATION DIGIT/ 73220;  Surgeon: Sharlotte Alamo, DPM;  Location: [facility];  Service: Podiatry;  Laterality: Right;   AMPUTATION TOE Right 02/18/2021   Procedure: AMPUTATION TOE-GREAT TOE;  Surgeon: Samara Deist, DPM;  Location: [facility];  Service: Podiatry;  Laterality: Right;   CHOLECYSTECTOMY     facial  biopsy Right    JOINT REPLACEMENT Right 12/2016   THR   LOWER EXTREMITY ANGIOGRAPHY Right 09/27/2017   Procedure: LOWER EXTREMITY ANGIOGRAPHY;  Surgeon: Katha Cabal, MD;  Location: [facility];  Service: Cardiovascular;  Laterality: Right;   LOWER EXTREMITY ANGIOGRAPHY Right 06/17/2020   Procedure: LOWER EXTREMITY ANGIOGRAPHY;  Surgeon: Katha Cabal, MD;  Location: [facility];  Service: Cardiovascular;  Laterality: Right;   LOWER EXTREMITY ANGIOGRAPHY Right 02/18/2021   Procedure: LOWER EXTREMITY ANGIOGRAPHY;  Surgeon: Katha Cabal, MD;  Location: [facility];  Service: Cardiovascular;  Laterality: Right;   TONSILLECTOMY     TOTAL HIP ARTHROPLASTY Right 01/17/2017   Procedure: TOTAL HIP ARTHROPLASTY ANTERIOR APPROACH;  Surgeon: Lovell Sheehan, MD;  Location: [facility];  Service: Orthopedics;  Laterality: Right;    Social History Social History   Tobacco Use   Smoking status: Every Day    Packs/day: 0.50    Years: 50.00    Total pack years: 25.00    Types: Cigarettes   Smokeless tobacco: Never  Vaping Use   Vaping Use: Never used  Substance Use Topics   Alcohol use: Yes    Alcohol/week: 3.0 standard drinks of alcohol    Types: 3 Cans of beer per week    Comment: occasional   Drug use: No    Family History Family History  Problem Relation Age of Onset   Breast cancer Sister 16   Breast cancer Other  Allergies  Allergen Reactions   Lisinopril-Hydrochlorothiazide Other (See Comments)     REVIEW OF SYSTEMS (Negative unless checked)  Constitutional: '[]'$ Weight loss  '[]'$ Fever  '[]'$ Chills Cardiac: '[]'$ Chest pain   '[]'$ Chest pressure   '[]'$ Palpitations   '[]'$ Shortness of breath when laying flat   '[]'$ Shortness of breath with exertion. Vascular:  '[x]'$ Pain in legs with walking   '[]'$ Pain in legs at rest  '[]'$ History of DVT   '[]'$ Phlebitis   '[]'$ Swelling in legs   '[]'$ Varicose veins   '[]'$ Non-healing ulcers Pulmonary:   '[]'$ Uses home oxygen   '[]'$ Productive  cough   '[]'$ Hemoptysis   '[]'$ Wheeze  '[]'$ COPD   '[]'$ Asthma Neurologic:  '[]'$ Dizziness   '[]'$ Seizures   '[]'$ History of stroke   '[]'$ History of TIA  '[]'$ Aphasia   '[]'$ Vissual changes   '[]'$ Weakness or numbness in arm   '[]'$ Weakness or numbness in leg Musculoskeletal:   '[]'$ Joint swelling   '[]'$ Joint pain   '[]'$ Low back pain Hematologic:  '[]'$ Easy bruising  '[]'$ Easy bleeding   '[]'$ Hypercoagulable state   '[]'$ Anemic Gastrointestinal:  '[]'$ Diarrhea   '[]'$ Vomiting  '[]'$ Gastroesophageal reflux/heartburn   '[]'$ Difficulty swallowing. Genitourinary:  '[]'$ Chronic kidney disease   '[]'$ Difficult urination  '[]'$ Frequent urination   '[]'$ Blood in urine Skin:  '[]'$ Rashes   '[]'$ Ulcers  Psychological:  '[]'$ History of anxiety   '[]'$  History of major depression.  Physical Examination  There were no vitals filed for this visit. There is no height or weight on file to calculate BMI. Gen: WD/WN, NAD Head: [place]/AT, No temporalis wasting.  Ear/Nose/Throat: Hearing grossly intact, nares w/o erythema or drainage Eyes: PER, EOMI, sclera nonicteric.  Neck: Supple, no masses.  No bruit or JVD.  Pulmonary:  Good air movement, no audible wheezing, no use of accessory muscles.  Cardiac: RRR, normal S1, S2, no Murmurs. Vascular:  mild trophic changes, no open wounds Vessel Right Left  Radial Palpable Palpable  PT Not Palpable Not Palpable  DP Not Palpable Not Palpable  Gastrointestinal: soft, non-distended. No guarding/no peritoneal signs.  Musculoskeletal: M/S 5/5 throughout.  No visible deformity.  Neurologic: CN 2-12 intact. Pain and light touch intact in extremities.  Symmetrical.  Speech is fluent. Motor exam as listed above. Psychiatric: Judgment intact, Mood & affect appropriate for pt's clinical situation. Dermatologic: No rashes or ulcers noted.  No changes consistent with cellulitis.   CBC Lab Results  Component Value Date   WBC 5.3 02/19/2021   HGB 10.8 (L) 02/19/2021   HCT 33.3 (L) 02/19/2021   MCV 97.4 02/19/2021   PLT 309 02/19/2021    BMET    Component  Value Date/Time   NA 134 (L) 02/18/2021 0455   K 3.9 02/18/2021 0455   CL 104 02/18/2021 0455   CO2 24 02/18/2021 0455   GLUCOSE 108 (H) 02/18/2021 0455   BUN 14 02/18/2021 0455   CREATININE 0.66 02/19/2021 0835   CALCIUM 9.2 02/18/2021 0455   GFRNONAA >60 02/19/2021 0835   GFRAA >60 09/15/2018 1742   CrCl cannot be calculated (Patient's most recent lab result is older than the maximum 21 days allowed.).  COAG Lab Results  Component Value Date   INR 1.04 01/05/2017    Radiology DG Ribs Unilateral Right  Result Date: 03/22/2022 CLINICAL DATA:  Right chest wall pain after a fall. EXAM: RIGHT RIBS - 2 VIEW COMPARISON:  09/15/2018. FINDINGS: No fracture or other bone lesions are seen involving the ribs. IMPRESSION: Negative. Electronically Signed   By: Lajean Manes M.D.   On: 03/22/2022 14:27     Assessment/Plan There are no diagnoses linked  to this encounter.   Hortencia Pilar, MD  03/23/2022 4:43 PM

## 2022-03-27 DIAGNOSIS — E782 Mixed hyperlipidemia: Secondary | ICD-10-CM | POA: Diagnosis not present

## 2022-03-27 DIAGNOSIS — J449 Chronic obstructive pulmonary disease, unspecified: Secondary | ICD-10-CM | POA: Diagnosis not present

## 2022-03-27 DIAGNOSIS — I10 Essential (primary) hypertension: Secondary | ICD-10-CM | POA: Diagnosis not present

## 2022-04-01 ENCOUNTER — Ambulatory Visit (INDEPENDENT_AMBULATORY_CARE_PROVIDER_SITE_OTHER): Payer: Medicare Other | Admitting: Vascular Surgery

## 2022-04-01 ENCOUNTER — Encounter (INDEPENDENT_AMBULATORY_CARE_PROVIDER_SITE_OTHER): Payer: Medicare HMO

## 2022-04-01 DIAGNOSIS — I70213 Atherosclerosis of native arteries of extremities with intermittent claudication, bilateral legs: Secondary | ICD-10-CM

## 2022-04-01 DIAGNOSIS — E782 Mixed hyperlipidemia: Secondary | ICD-10-CM

## 2022-04-01 DIAGNOSIS — I10 Essential (primary) hypertension: Secondary | ICD-10-CM

## 2022-04-01 DIAGNOSIS — I6523 Occlusion and stenosis of bilateral carotid arteries: Secondary | ICD-10-CM

## 2022-04-01 DIAGNOSIS — K219 Gastro-esophageal reflux disease without esophagitis: Secondary | ICD-10-CM

## 2022-06-09 ENCOUNTER — Telehealth: Payer: Self-pay

## 2022-06-09 NOTE — Telephone Encounter (Signed)
COPD Review Call  [place]  3 years, Female  DOB: 1955/09/24  M: [phone]  __________________________________________________ COPD Review (HC) Chart Review Eosinophil Count (last): 1 on: 11/26/2021 Last CAT Score: 20 on: 06/09/2022 What recent interventions have been made by any provider to improve the patient's conditions in the last 3 months?: Office Visit: 03/12/22 Mechele Claude. NP-C. For follow-up. STARTED Z-Pack take as prescribed. 03/27/22 Perrin Maltese MD. For follow-up. No information given  Consults: 03/18/22 Podiatry Vickki Muff, Marlan Palau, DPM  For Recheck Left Great Toe. No medication changes. Has there been any documented recent hospitalizations or ED visits since last visit with Clinical Lead?: No Adherence Review Does the [facility] have access to medication refill data?: Yes Adherence rates for STAR metric medications: Rosuvastatin 20 mg - 03/11/22 30 DS - Patient is out - Requested refills for the patient for everything . Adherence rates for medications indicated for disease state being reviewed: None. Does the patient have >5 day gap between last estimated fill dates for any of the above medications?: Yes Reasons for medication gaps: Rosuvastatin 20 mg - 03/11/22 30 DS - Patient is out - Requested refills for the patient for everything . Disease State Questions Able to connect with the Patient?: Yes How often do you use your rescue inhaler Albuterol?: Daily Is patient prescribed a maintenance inhaler?: No CAT Assessment: Done How often are you coughing?: 4 How much mucus are you feeling in your chest?: 4 How much chest tightness are you having?: 0 (My chest does not feel tight at all) How is your breath when you walk up a hill or flight of stairs?: 3 How are your limitations to doing activities at home?: 0 (I am not limited to doing any activities at home) How would you rate your confidence in leaving your home due to your lung condition?: 4 How are you  sleeping?: 2 How is your energy level?: 3 Total CAT Score: 20 Misc. Response/Information:: Patient stated shes been having mucus in her chest from her COPD for a few months . Message sent to you on EPIC. Engagement Notes Charlann Lange on 06/08/2022 01:36 PM [facility] Chart Review: 10 min 06/08/22  HC Assessment call time spent: 25 min 06/09/22   Clinical Lead Review Review Adherence gaps identified?: No Drug Therapy Problems identified?: Yes Details: potential refills Assessment: Controlled Engagement Notes Jerral Ralph on 06/09/2022 04:33 PM 03/13/20246 min Patient call Chronic Care Management COPD review call and refill requests

## 2022-06-11 ENCOUNTER — Other Ambulatory Visit: Payer: Self-pay

## 2022-06-11 MED ORDER — ROSUVASTATIN CALCIUM 20 MG PO TABS: 20.0000 mg | ORAL_TABLET | Freq: Every day | ORAL | 3 refills | Status: DC

## 2022-06-11 MED ORDER — IPRATROPIUM-ALBUTEROL 0.5-2.5 (3) MG/3ML IN SOLN: 3.0000 mL | Freq: Four times a day (QID) | RESPIRATORY_TRACT | 3 refills | Status: DC | PRN

## 2022-06-11 MED ORDER — AMLODIPINE BESYLATE 5 MG PO TABS: 5.0000 mg | ORAL_TABLET | Freq: Every day | ORAL | 3 refills | Status: DC

## 2022-06-11 MED ORDER — ALBUTEROL SULFATE HFA 108 (90 BASE) MCG/ACT IN AERS: 2.0000 | INHALATION_SPRAY | Freq: Four times a day (QID) | RESPIRATORY_TRACT | 6 refills | Status: DC | PRN

## 2022-06-11 MED ORDER — PANTOPRAZOLE SODIUM 40 MG PO TBEC: 40.0000 mg | DELAYED_RELEASE_TABLET | Freq: Every morning | ORAL | 3 refills | Status: DC

## 2022-06-11 MED ORDER — CLOPIDOGREL BISULFATE 75 MG PO TABS: 75.0000 mg | ORAL_TABLET | Freq: Every day | ORAL | 6 refills | Status: DC

## 2022-06-11 MED ORDER — GABAPENTIN 300 MG PO CAPS: ORAL_CAPSULE | ORAL | 1 refills | Status: DC

## 2022-06-27 DIAGNOSIS — I10 Essential (primary) hypertension: Secondary | ICD-10-CM | POA: Diagnosis not present

## 2022-06-27 DIAGNOSIS — J449 Chronic obstructive pulmonary disease, unspecified: Secondary | ICD-10-CM | POA: Diagnosis not present

## 2022-06-27 DIAGNOSIS — E782 Mixed hyperlipidemia: Secondary | ICD-10-CM | POA: Diagnosis not present

## 2022-07-26 DIAGNOSIS — M7061 Trochanteric bursitis, right hip: Secondary | ICD-10-CM | POA: Diagnosis not present

## 2022-07-29 DIAGNOSIS — L97522 Non-pressure chronic ulcer of other part of left foot with fat layer exposed: Secondary | ICD-10-CM | POA: Diagnosis not present

## 2022-07-29 DIAGNOSIS — L851 Acquired keratosis [keratoderma] palmaris et plantaris: Secondary | ICD-10-CM | POA: Diagnosis not present

## 2022-07-29 DIAGNOSIS — I739 Peripheral vascular disease, unspecified: Secondary | ICD-10-CM | POA: Diagnosis not present

## 2022-07-29 DIAGNOSIS — E119 Type 2 diabetes mellitus without complications: Secondary | ICD-10-CM | POA: Diagnosis not present

## 2022-07-29 DIAGNOSIS — Z89421 Acquired absence of other right toe(s): Secondary | ICD-10-CM | POA: Diagnosis not present

## 2022-07-29 DIAGNOSIS — D2372 Other benign neoplasm of skin of left lower limb, including hip: Secondary | ICD-10-CM | POA: Diagnosis not present

## 2022-07-29 DIAGNOSIS — B351 Tinea unguium: Secondary | ICD-10-CM | POA: Diagnosis not present

## 2022-08-10 NOTE — Progress Notes (Signed)
MRN : [id number]  Jackie Macias is a 67 y.o. (03-15-1956) female who presents with chief complaint of check circulation.  History of Present Illness:   The patient returns to the office for followup and review of the noninvasive studies.   The patient notes that there has been a significant deterioration in the lower extremity symptoms.  The patient notes interval shortening of their claudication distance and development of mild rest pain symptoms. No new ulcers or wounds have occurred since the last visit.  There have been no significant changes to the patient's overall health care.  The patient denies amaurosis fugax or recent TIA symptoms. There are no recent neurological changes noted. There is no history of DVT, PE or superficial thrombophlebitis. The patient denies recent episodes of angina or shortness of breath.   ABI's Rt=*** and Lt=*** (previous ABI's Rt=*** and Lt=***) Duplex US of the lower extremity arterial system shows ***  No outpatient medications have been marked as taking for the 08/12/22 encounter (Appointment) with Gilda Crease, Latina Craver, MD.    Past Medical History:  Diagnosis Date   Arthritis    Bronchitis    COPD (chronic obstructive pulmonary disease) ([facility])    GERD (gastroesophageal reflux disease)    Gout    Hypertension     Past Surgical History:  Procedure Laterality Date   AMPUTATION Right 07/29/2017   Procedure: AMPUTATION DIGIT/ 82956;  Surgeon: Linus Galas, DPM;  Location: [facility];  Service: Podiatry;  Laterality: Right;   AMPUTATION TOE Right 02/18/2021   Procedure: AMPUTATION TOE-GREAT TOE;  Surgeon: Gwyneth Revels, DPM;  Location: [facility];  Service: Podiatry;  Laterality: Right;   CHOLECYSTECTOMY     facial biopsy Right    JOINT REPLACEMENT Right 12/2016   THR   LOWER EXTREMITY ANGIOGRAPHY Right 09/27/2017   Procedure: LOWER EXTREMITY ANGIOGRAPHY;  Surgeon: Renford Dills, MD;  Location: [facility];  Service: Cardiovascular;  Laterality: Right;   LOWER EXTREMITY ANGIOGRAPHY Right 06/17/2020   Procedure: LOWER EXTREMITY ANGIOGRAPHY;  Surgeon: Renford Dills, MD;  Location: [facility];  Service: Cardiovascular;  Laterality: Right;   LOWER EXTREMITY ANGIOGRAPHY Right 02/18/2021   Procedure: LOWER EXTREMITY ANGIOGRAPHY;  Surgeon: Renford Dills, MD;  Location: [facility];  Service: Cardiovascular;  Laterality: Right;   TONSILLECTOMY     TOTAL HIP ARTHROPLASTY Right 01/17/2017   Procedure: TOTAL HIP ARTHROPLASTY ANTERIOR APPROACH;  Surgeon: Lyndle Herrlich, MD;  Location: [facility];  Service: Orthopedics;  Laterality: Right;    Social History Social History   Tobacco Use   Smoking status: Every Day    Packs/day: 0.50    Years: 50.00    Additional pack years: 0.00    Total pack years: 25.00    Types: Cigarettes   Smokeless tobacco: Never  Vaping Use   Vaping Use: Never used  Substance Use Topics   Alcohol use: Yes    Alcohol/week: 3.0 standard drinks of alcohol    Types: 3 Cans of beer per week    Comment: occasional   Drug use: No  Family History Family History  Problem Relation Age of Onset   Breast cancer Sister 53   Breast cancer Other     Allergies  Allergen Reactions   Lisinopril-Hydrochlorothiazide Other (See Comments)     REVIEW OF SYSTEMS (Negative unless checked)  Constitutional: [] Weight loss  [] Fever  [] Chills Cardiac: [] Chest pain   [] Chest pressure   [] Palpitations   [] Shortness of breath when laying flat   [] Shortness of breath with exertion. Vascular:  [x] Pain in legs with walking   [] Pain in legs at rest  [] History of DVT   [] Phlebitis   [] Swelling in legs   [] Varicose veins   [] Non-healing ulcers Pulmonary:   [] Uses home oxygen   [] Productive cough   [] Hemoptysis   [] Wheeze  [] COPD   [] Asthma Neurologic:  [] Dizziness   [] Seizures   [] History of stroke   [] History of TIA   [] Aphasia   [] Vissual changes   [] Weakness or numbness in arm   [x] Weakness or numbness in leg Musculoskeletal:   [] Joint swelling   [x] Joint pain   [] Low back pain Hematologic:  [] Easy bruising  [] Easy bleeding   [] Hypercoagulable state   [x] Anemic Gastrointestinal:  [] Diarrhea   [] Vomiting  [x] Gastroesophageal reflux/heartburn   [] Difficulty swallowing. Genitourinary:  [] Chronic kidney disease   [] Difficult urination  [] Frequent urination   [] Blood in urine Skin:  [] Rashes   [] Ulcers  Psychological:  [] History of anxiety   []  History of major depression.  Physical Examination  There were no vitals filed for this visit. There is no height or weight on file to calculate BMI. Gen: WD/WN, NAD Head: [place]/AT, No temporalis wasting.  Ear/Nose/Throat: Hearing grossly intact, nares w/o erythema or drainage Eyes: PER, EOMI, sclera nonicteric.  Neck: Supple, no masses.  No bruit or JVD.  Pulmonary:  Good air movement, no audible wheezing, no use of accessory muscles.  Cardiac: RRR, normal S1, S2, no Murmurs. Vascular:  mild trophic changes, no open wounds Vessel Right Left  Radial Palpable Palpable  PT Not Palpable Not Palpable  DP Not Palpable Not Palpable  Gastrointestinal: soft, non-distended. No guarding/no peritoneal signs.  Musculoskeletal: M/S 5/5 throughout.  No visible deformity.  Neurologic: CN 2-12 intact. Pain and light touch intact in extremities.  Symmetrical.  Speech is fluent. Motor exam as listed above. Psychiatric: Judgment intact, Mood & affect appropriate for pt's clinical situation. Dermatologic: No rashes or ulcers noted.  No changes consistent with cellulitis.   CBC Lab Results  Component Value Date   WBC 5.3 02/19/2021   HGB 10.8 (L) 02/19/2021   HCT 33.3 (L) 02/19/2021   MCV 97.4 02/19/2021   PLT 309 02/19/2021    BMET    Component Value Date/Time   NA 134 (L) 02/18/2021 0455   K 3.9 02/18/2021 0455   CL 104 02/18/2021 0455   CO2 24 02/18/2021 0455    GLUCOSE 108 (H) 02/18/2021 0455   BUN 14 02/18/2021 0455   CREATININE 0.66 02/19/2021 0835   CALCIUM 9.2 02/18/2021 0455   GFRNONAA >60 02/19/2021 0835   GFRAA >60 09/15/2018 1742   CrCl cannot be calculated (Patient's most recent lab result is older than the maximum 21 days allowed.).  COAG Lab Results  Component Value Date   INR 1.04 01/05/2017    Radiology No results found.   Assessment/Plan There are no diagnoses linked to this encounter.   Levora Dredge, MD  08/10/2022 12:16 PM

## 2022-08-10 NOTE — H&P (View-Only) (Signed)
MRN : [id number]  Jackie Macias is a 67 y.o. (March 20, 1956) female who presents with chief complaint of check circulation.  History of Present Illness:   The patient returns to the office for followup and review of the noninvasive studies.   She is status post right lower extremity angiography June 17, 2020:   Percutaneous transluminal angioplasty right superficial femoral and popliteal arteries to 5 mm with Lutonix drug-eluting balloon 2.    Percutaneous transluminal angioplasty right peroneal     artery to 3 mm within Ultraverse balloon  The patient notes that there has been a significant deterioration in the lower extremity symptoms.  The patient notes interval shortening of their claudication distance and development of mild rest pain symptoms.   Is a new complicating issue she has been dealing with an ingrown toenail of the left great toe with associated infection.  She has been seeing Dr. Ether Griffins.  This has not been healing well and this was a significant reason for Dr. Gilman Schmidt sending her back.  There have been no significant changes to the patient's overall health care.  The patient denies amaurosis fugax or recent TIA symptoms. There are no recent neurological changes noted. There is no history of DVT, PE or superficial thrombophlebitis. The patient denies recent episodes of angina or shortness of breath.   ABI's Rt=0.82 and Lt=0.59 (previous ABI's Rt=0.91 and Lt=0.61)   No outpatient medications have been marked as taking for the 08/12/22 encounter (Appointment) with Gilda Crease, Latina Craver, MD.    Past Medical History:  Diagnosis Date   Arthritis    Bronchitis    COPD (chronic obstructive pulmonary disease) (HCC)    GERD (gastroesophageal reflux disease)    Gout    Hypertension     Past Surgical History:  Procedure Laterality Date   AMPUTATION Right 07/29/2017   Procedure: AMPUTATION DIGIT/ 40981;   Surgeon: Linus Galas, DPM;  Location: [facility];  Service: Podiatry;  Laterality: Right;   AMPUTATION TOE Right 02/18/2021   Procedure: AMPUTATION TOE-GREAT TOE;  Surgeon: Gwyneth Revels, DPM;  Location: [facility];  Service: Podiatry;  Laterality: Right;   CHOLECYSTECTOMY     facial biopsy Right    JOINT REPLACEMENT Right 12/2016   THR   LOWER EXTREMITY ANGIOGRAPHY Right 09/27/2017   Procedure: LOWER EXTREMITY ANGIOGRAPHY;  Surgeon: Renford Dills, MD;  Location: [facility];  Service: Cardiovascular;  Laterality: Right;   LOWER EXTREMITY ANGIOGRAPHY Right 06/17/2020   Procedure: LOWER EXTREMITY ANGIOGRAPHY;  Surgeon: Renford Dills, MD;  Location: [facility];  Service: Cardiovascular;  Laterality: Right;   LOWER EXTREMITY ANGIOGRAPHY Right 02/18/2021   Procedure: LOWER EXTREMITY ANGIOGRAPHY;  Surgeon: Renford Dills, MD;  Location: [facility];  Service: Cardiovascular;  Laterality: Right;   TONSILLECTOMY     TOTAL HIP ARTHROPLASTY Right 01/17/2017   Procedure: TOTAL HIP ARTHROPLASTY ANTERIOR APPROACH;  Surgeon: Lyndle Herrlich, MD;  Location: [facility];  Service: Orthopedics;  Laterality: Right;    Social History Social History   Tobacco Use   Smoking status: Every Day  Packs/day: 0.50    Years: 50.00    Additional pack years: 0.00    Total pack years: 25.00    Types: Cigarettes   Smokeless tobacco: Never  Vaping Use   Vaping Use: Never used  Substance Use Topics   Alcohol use: Yes    Alcohol/week: 3.0 standard drinks of alcohol    Types: 3 Cans of beer per week    Comment: occasional   Drug use: No    Family History Family History  Problem Relation Age of Onset   Breast cancer Sister 64   Breast cancer Other     Allergies  Allergen Reactions   Lisinopril-Hydrochlorothiazide Other (See Comments)     REVIEW OF SYSTEMS (Negative unless checked)  Constitutional: [] Weight loss  [] Fever  [] Chills Cardiac: [] Chest pain   [] Chest  pressure   [] Palpitations   [] Shortness of breath when laying flat   [] Shortness of breath with exertion. Vascular:  [x] Pain in legs with walking   [] Pain in legs at rest  [] History of DVT   [] Phlebitis   [] Swelling in legs   [] Varicose veins   [] Non-healing ulcers Pulmonary:   [] Uses home oxygen   [] Productive cough   [] Hemoptysis   [] Wheeze  [] COPD   [] Asthma Neurologic:  [] Dizziness   [] Seizures   [] History of stroke   [] History of TIA  [] Aphasia   [] Vissual changes   [] Weakness or numbness in arm   [x] Weakness or numbness in leg Musculoskeletal:   [] Joint swelling   [x] Joint pain   [] Low back pain Hematologic:  [] Easy bruising  [] Easy bleeding   [] Hypercoagulable state   [x] Anemic Gastrointestinal:  [] Diarrhea   [] Vomiting  [x] Gastroesophageal reflux/heartburn   [] Difficulty swallowing. Genitourinary:  [] Chronic kidney disease   [] Difficult urination  [] Frequent urination   [] Blood in urine Skin:  [] Rashes   [] Ulcers  Psychological:  [] History of anxiety   []  History of major depression.  Physical Examination  There were no vitals filed for this visit. There is no height or weight on file to calculate BMI. Gen: WD/WN, NAD Head: [place]/AT, No temporalis wasting.  Ear/Nose/Throat: Hearing grossly intact, nares w/o erythema or drainage Eyes: PER, EOMI, sclera nonicteric.  Neck: Supple, no masses.  No bruit or JVD.  Pulmonary:  Good air movement, no audible wheezing, no use of accessory muscles.  Cardiac: RRR, normal S1, S2, no Murmurs. Vascular:  mild trophic changes, left great toe open wound; right great toe is status post amputation Vessel Right Left  Radial Palpable Palpable  PT Not Palpable Not Palpable  DP Not Palpable Not Palpable  Gastrointestinal: soft, non-distended. No guarding/no peritoneal signs.  Musculoskeletal: M/S 5/5 throughout.  No visible deformity.  Neurologic: CN 2-12 intact. Pain and light touch intact in extremities.  Symmetrical.  Speech is fluent. Motor exam as  listed above. Psychiatric: Judgment intact, Mood & affect appropriate for pt's clinical situation. Dermatologic: No rashes or ulcers noted.  No changes consistent with cellulitis.   CBC Lab Results  Component Value Date   WBC 5.3 02/19/2021   HGB 10.8 (L) 02/19/2021   HCT 33.3 (L) 02/19/2021   MCV 97.4 02/19/2021   PLT 309 02/19/2021    BMET    Component Value Date/Time   NA 134 (L) 02/18/2021 0455   K 3.9 02/18/2021 0455   CL 104 02/18/2021 0455   CO2 24 02/18/2021 0455   GLUCOSE 108 (H) 02/18/2021 0455   BUN 14 02/18/2021 0455   CREATININE 0.66 02/19/2021 0835   CALCIUM 9.2  02/18/2021 0455   GFRNONAA >60 02/19/2021 0835   GFRAA >60 09/15/2018 1742   CrCl cannot be calculated (Patient's most recent lab result is older than the maximum 21 days allowed.).  COAG Lab Results  Component Value Date   INR 1.04 01/05/2017    Radiology No results found.   Assessment/Plan 1. Atherosclerosis of native arteries of the extremities with ulceration (HCC)  Recommend:  The patient has evidence of severe atherosclerotic changes of both lower extremities associated with ulceration and tissue loss of the left foot.  This represents a limb threatening ischemia and places the patient at the risk for left limb loss.  Patient should undergo angiography of the left lower extremity with the hope for intervention for limb salvage.  The risks and benefits as well as the alternative therapies was discussed in detail with the patient.  All questions were answered.  Patient agrees to proceed with left angiography.  The patient will follow up with me in the office after the procedure.    I70.223    critical limb ischemia of the lower extremity I70.25    Atherosclerotic occlusive disease with ulcer  CPT codes: 16109   stent placement iliac artery 37226   stent placement femoral-popliteal artery 37228   angioplasty tibial peroneal artery 36247   introduction catheter below diaphragm third  order  2. Bilateral carotid artery stenosis Recommend:  Given the patient's asymptomatic subcritical stenosis no further invasive testing or surgery at this time.  Duplex ultrasound shows <50% stenosis bilaterally.  Continue antiplatelet therapy as prescribed Continue management of CAD, HTN and Hyperlipidemia Healthy heart diet,  encouraged exercise at least 4 times per week Follow up in 12 months with duplex ultrasound and physical exam   3. Gastroesophageal reflux disease, unspecified whether esophagitis present Continue PPI as already ordered, this medication has been reviewed and there are no changes at this time.  Avoidence of caffeine and alcohol  Moderate elevation of the head of the bed   4. Essential hypertension Continue antihypertensive medications as already ordered, these medications have been reviewed and there are no changes at this time.  5. Mixed hyperlipidemia Continue statin as ordered and reviewed, no changes at this time    Levora Dredge, MD  08/10/2022 12:16 PM

## 2022-08-12 ENCOUNTER — Ambulatory Visit (INDEPENDENT_AMBULATORY_CARE_PROVIDER_SITE_OTHER): Payer: Medicare HMO | Admitting: Vascular Surgery

## 2022-08-12 ENCOUNTER — Ambulatory Visit (INDEPENDENT_AMBULATORY_CARE_PROVIDER_SITE_OTHER): Payer: Medicare HMO

## 2022-08-12 ENCOUNTER — Encounter (INDEPENDENT_AMBULATORY_CARE_PROVIDER_SITE_OTHER): Payer: Self-pay | Admitting: Vascular Surgery

## 2022-08-12 VITALS — BP 178/80 | HR 65 | Resp 18 | Ht 66.0 in | Wt 182.8 lb

## 2022-08-12 DIAGNOSIS — I6523 Occlusion and stenosis of bilateral carotid arteries: Secondary | ICD-10-CM | POA: Diagnosis not present

## 2022-08-12 DIAGNOSIS — I739 Peripheral vascular disease, unspecified: Secondary | ICD-10-CM | POA: Diagnosis not present

## 2022-08-12 DIAGNOSIS — K219 Gastro-esophageal reflux disease without esophagitis: Secondary | ICD-10-CM

## 2022-08-12 DIAGNOSIS — I7025 Atherosclerosis of native arteries of other extremities with ulceration: Secondary | ICD-10-CM | POA: Diagnosis not present

## 2022-08-12 DIAGNOSIS — I70213 Atherosclerosis of native arteries of extremities with intermittent claudication, bilateral legs: Secondary | ICD-10-CM

## 2022-08-12 DIAGNOSIS — E782 Mixed hyperlipidemia: Secondary | ICD-10-CM | POA: Diagnosis not present

## 2022-08-12 DIAGNOSIS — I10 Essential (primary) hypertension: Secondary | ICD-10-CM | POA: Diagnosis not present

## 2022-08-13 LAB — VAS US ABI WITH/WO TBI
Left ABI: 0.61
Right ABI: 0.82

## 2022-08-17 ENCOUNTER — Telehealth (INDEPENDENT_AMBULATORY_CARE_PROVIDER_SITE_OTHER): Payer: Self-pay

## 2022-08-17 NOTE — Telephone Encounter (Signed)
I called the patient again and spoke with her, patient is scheduled with Dr. Gilda Crease on 09/07/22 with a 9:00 am arrival time to the [facility] for a left leg angio. Pre-procedure instructions were discussed and will be mailed. Patient was offered 08/24/22 and 08/31/22 and declined both.

## 2022-08-17 NOTE — Telephone Encounter (Signed)
I have attempted to contact the patient to schedule a left leg angio with Dr. Gilda Crease. A message was left on the mobile, home and her daughter's voicemail to return my call.

## 2022-08-24 ENCOUNTER — Encounter: Admission: RE | Payer: Self-pay | Source: Home / Self Care

## 2022-08-24 ENCOUNTER — Ambulatory Visit: Admission: RE | Admit: 2022-08-24 | Payer: Medicare HMO | Source: Home / Self Care | Admitting: Vascular Surgery

## 2022-08-24 DIAGNOSIS — L97909 Non-pressure chronic ulcer of unspecified part of unspecified lower leg with unspecified severity: Secondary | ICD-10-CM

## 2022-08-24 SURGERY — LOWER EXTREMITY ANGIOGRAPHY
Anesthesia: Moderate Sedation | Site: Leg Lower | Laterality: Left

## 2022-08-26 DIAGNOSIS — N3281 Overactive bladder: Secondary | ICD-10-CM | POA: Diagnosis not present

## 2022-08-26 DIAGNOSIS — N3946 Mixed incontinence: Secondary | ICD-10-CM | POA: Diagnosis not present

## 2022-09-07 ENCOUNTER — Encounter
Admission: RE | Disposition: A | Discharge status: Home / Self Care | Payer: Self-pay | Source: Home / Self Care | Attending: Vascular Surgery

## 2022-09-07 ENCOUNTER — Encounter: Payer: Self-pay | Admitting: Vascular Surgery

## 2022-09-07 ENCOUNTER — Other Ambulatory Visit: Payer: Self-pay

## 2022-09-07 ENCOUNTER — Ambulatory Visit
Admission: RE | Admit: 2022-09-07 | Discharge: 2022-09-07 | Disposition: A | Discharge status: Home / Self Care | Payer: Medicare HMO | Attending: Vascular Surgery | Admitting: Vascular Surgery

## 2022-09-07 ENCOUNTER — Encounter: Payer: Self-pay | Admitting: Anesthesiology

## 2022-09-07 DIAGNOSIS — I70201 Unspecified atherosclerosis of native arteries of extremities, right leg: Secondary | ICD-10-CM

## 2022-09-07 DIAGNOSIS — I700 Atherosclerosis of aorta: Secondary | ICD-10-CM | POA: Diagnosis not present

## 2022-09-07 DIAGNOSIS — L97929 Non-pressure chronic ulcer of unspecified part of left lower leg with unspecified severity: Secondary | ICD-10-CM | POA: Diagnosis not present

## 2022-09-07 DIAGNOSIS — I70249 Atherosclerosis of native arteries of left leg with ulceration of unspecified site: Secondary | ICD-10-CM | POA: Diagnosis not present

## 2022-09-07 DIAGNOSIS — F17210 Nicotine dependence, cigarettes, uncomplicated: Secondary | ICD-10-CM | POA: Insufficient documentation

## 2022-09-07 DIAGNOSIS — I10 Essential (primary) hypertension: Secondary | ICD-10-CM | POA: Insufficient documentation

## 2022-09-07 DIAGNOSIS — E782 Mixed hyperlipidemia: Secondary | ICD-10-CM | POA: Diagnosis not present

## 2022-09-07 DIAGNOSIS — K219 Gastro-esophageal reflux disease without esophagitis: Secondary | ICD-10-CM | POA: Insufficient documentation

## 2022-09-07 DIAGNOSIS — I2510 Atherosclerotic heart disease of native coronary artery without angina pectoris: Secondary | ICD-10-CM | POA: Diagnosis not present

## 2022-09-07 DIAGNOSIS — I70245 Atherosclerosis of native arteries of left leg with ulceration of other part of foot: Secondary | ICD-10-CM | POA: Insufficient documentation

## 2022-09-07 DIAGNOSIS — L97909 Non-pressure chronic ulcer of unspecified part of unspecified lower leg with unspecified severity: Secondary | ICD-10-CM

## 2022-09-07 DIAGNOSIS — I6523 Occlusion and stenosis of bilateral carotid arteries: Secondary | ICD-10-CM | POA: Diagnosis not present

## 2022-09-07 DIAGNOSIS — L97529 Non-pressure chronic ulcer of other part of left foot with unspecified severity: Secondary | ICD-10-CM | POA: Insufficient documentation

## 2022-09-07 HISTORY — PX: LOWER EXTREMITY ANGIOGRAPHY: CATH118251

## 2022-09-07 LAB — CREATININE, SERUM
Creatinine, Ser: 0.66 mg/dL (ref 0.44–1.00)
GFR, Estimated: >60 mL/min (ref >60)

## 2022-09-07 LAB — BUN: BUN: 9 mg/dL (ref 8–23)

## 2022-09-07 SURGERY — LOWER EXTREMITY ANGIOGRAPHY
Anesthesia: Moderate Sedation | Site: Leg Lower | Laterality: Left

## 2022-09-07 MED ORDER — MIDAZOLAM HCL 2 MG/ML PO SYRP: 8.0000 mg | ORAL_SOLUTION | Freq: Once | ORAL | Status: DC | PRN

## 2022-09-07 MED ORDER — CEFAZOLIN SODIUM-DEXTROSE 2-4 GM/100ML-% IV SOLN
2.0000 g | INTRAVENOUS | Status: AC
  Administered 2022-09-07: 2 g via INTRAVENOUS

## 2022-09-07 MED ORDER — METHYLPREDNISOLONE SODIUM SUCC 125 MG IJ SOLR: 125.0000 mg | Freq: Once | INTRAMUSCULAR | Status: DC | PRN

## 2022-09-07 MED ORDER — HEPARIN SODIUM (PORCINE) 1000 UNIT/ML IJ SOLN
INTRAMUSCULAR | Status: DC | PRN
  Administered 2022-09-07: 6000 [IU] via INTRAVENOUS

## 2022-09-07 MED ORDER — LABETALOL HCL 5 MG/ML IV SOLN
INTRAVENOUS | Status: AC
  Filled 2022-09-07: qty 4

## 2022-09-07 MED ORDER — LABETALOL HCL 5 MG/ML IV SOLN
INTRAVENOUS | Status: DC | PRN
  Administered 2022-09-07: 10 mg via INTRAVENOUS

## 2022-09-07 MED ORDER — FENTANYL CITRATE (PF) 100 MCG/2ML IJ SOLN
INTRAMUSCULAR | Status: DC | PRN
  Administered 2022-09-07: 25 ug via INTRAVENOUS
  Administered 2022-09-07: 50 ug via INTRAVENOUS

## 2022-09-07 MED ORDER — FAMOTIDINE 20 MG PO TABS: 40.0000 mg | ORAL_TABLET | Freq: Once | ORAL | Status: DC | PRN

## 2022-09-07 MED ORDER — MIDAZOLAM HCL 2 MG/2ML IJ SOLN
INTRAMUSCULAR | Status: DC | PRN
  Administered 2022-09-07: 1 mg via INTRAVENOUS

## 2022-09-07 MED ORDER — HYDRALAZINE HCL 20 MG/ML IJ SOLN: 5.0000 mg | INTRAMUSCULAR | Status: DC | PRN

## 2022-09-07 MED ORDER — DIPHENHYDRAMINE HCL 50 MG/ML IJ SOLN: 50.0000 mg | Freq: Once | INTRAMUSCULAR | Status: DC | PRN

## 2022-09-07 MED ORDER — CEFAZOLIN SODIUM-DEXTROSE 2-4 GM/100ML-% IV SOLN
INTRAVENOUS | Status: AC
  Filled 2022-09-07: qty 100

## 2022-09-07 MED ORDER — HEPARIN (PORCINE) IN NACL 1000-0.9 UT/500ML-% IV SOLN
INTRAVENOUS | Status: DC | PRN
  Administered 2022-09-07: 1000 mL

## 2022-09-07 MED ORDER — SODIUM CHLORIDE 0.9 % IV SOLN: 250.0000 mL | INTRAVENOUS | Status: DC | PRN

## 2022-09-07 MED ORDER — ONDANSETRON HCL 4 MG/2ML IJ SOLN: 4.0000 mg | Freq: Four times a day (QID) | INTRAMUSCULAR | Status: DC | PRN

## 2022-09-07 MED ORDER — SODIUM CHLORIDE 0.9% FLUSH: 3.0000 mL | Freq: Two times a day (BID) | INTRAVENOUS | Status: DC

## 2022-09-07 MED ORDER — LABETALOL HCL 5 MG/ML IV SOLN: 10.0000 mg | INTRAVENOUS | Status: DC | PRN

## 2022-09-07 MED ORDER — SODIUM CHLORIDE 0.9 % IV SOLN: INTRAVENOUS | Status: DC

## 2022-09-07 MED ORDER — SODIUM CHLORIDE 0.9% FLUSH: 3.0000 mL | INTRAVENOUS | Status: DC | PRN

## 2022-09-07 MED ORDER — MIDAZOLAM HCL 5 MG/5ML IJ SOLN
INTRAMUSCULAR | Status: AC
  Filled 2022-09-07: qty 5

## 2022-09-07 MED ORDER — MORPHINE SULFATE (PF) 4 MG/ML IV SOLN: 2.0000 mg | INTRAVENOUS | Status: DC | PRN

## 2022-09-07 MED ORDER — FENTANYL CITRATE (PF) 100 MCG/2ML IJ SOLN
INTRAMUSCULAR | Status: AC
  Filled 2022-09-07: qty 2

## 2022-09-07 MED ORDER — HYDROMORPHONE HCL 1 MG/ML IJ SOLN: 1.0000 mg | Freq: Once | INTRAMUSCULAR | Status: DC | PRN

## 2022-09-07 MED ORDER — HEPARIN SODIUM (PORCINE) 1000 UNIT/ML IJ SOLN
INTRAMUSCULAR | Status: AC
  Filled 2022-09-07: qty 10

## 2022-09-07 MED ORDER — ACETAMINOPHEN 325 MG PO TABS: 650.0000 mg | ORAL_TABLET | ORAL | Status: DC | PRN

## 2022-09-07 MED ORDER — OXYCODONE HCL 5 MG PO TABS: 5.0000 mg | ORAL_TABLET | ORAL | Status: DC | PRN

## 2022-09-07 SURGICAL SUPPLY — 39 items
BALLN LUTONIX 4X120X130 (BALLOONS) ×1
BALLN LUTONIX 4X220X130 (BALLOONS) ×1
BALLN LUTONIX 5X150X130 (BALLOONS) ×1
BALLN LUTONIX DCB 4X100X130 (BALLOONS) ×1
BALLN LUTONIX DCB 5X60X130 (BALLOONS) ×1
BALLN LUTONIX DCB 6X60X130 (BALLOONS) ×1
BALLN LUTONIX DCB 7X60X130 (BALLOONS) ×1
BALLN ULTRASCORE 6X40X130 (BALLOONS) ×1
BALLOON LUTONIX 4X120X130 (BALLOONS) IMPLANT
BALLOON LUTONIX 4X220X130 (BALLOONS) IMPLANT
BALLOON LUTONIX 5X150X130 (BALLOONS) IMPLANT
BALLOON LUTONIX DCB 4X100X130 (BALLOONS) IMPLANT
BALLOON LUTONIX DCB 5X60X130 (BALLOONS) IMPLANT
BALLOON LUTONIX DCB 6X60X130 (BALLOONS) IMPLANT
BALLOON LUTONIX DCB 7X60X130 (BALLOONS) IMPLANT
BALLOON ULTRASCORE 6X40X130 (BALLOONS) IMPLANT
CATH ANGIO 5F PIGTAIL 65CM (CATHETERS) IMPLANT
CATH TEMPO 5F RIM 65CM (CATHETERS) IMPLANT
COVER PROBE ULTRASOUND 5X96 (MISCELLANEOUS) IMPLANT
DEVICE STARCLOSE SE CLOSURE (Vascular Products) IMPLANT
GLIDEWIRE ADV .035X260CM (WIRE) IMPLANT
GOWN STRL REUS W/ TWL LRG LVL3 (GOWN DISPOSABLE) ×1 IMPLANT
GOWN STRL REUS W/ TWL XL LVL3 (GOWN DISPOSABLE) IMPLANT
GOWN STRL REUS W/TWL 2XL LVL3 (GOWN DISPOSABLE) IMPLANT
GOWN STRL REUS W/TWL LRG LVL3 (GOWN DISPOSABLE) ×1
GOWN STRL REUS W/TWL XL LVL3 (GOWN DISPOSABLE) ×1
KIT ENCORE 26 ADVANTAGE (KITS) IMPLANT
LIFESTENT SOLO 6X200X135 (Permanent Stent) IMPLANT
NDL ENTRY 21GA 7CM ECHOTIP (NEEDLE) IMPLANT
NEEDLE ENTRY 21GA 7CM ECHOTIP (NEEDLE) ×1 IMPLANT
PACK ANGIOGRAPHY (CUSTOM PROCEDURE TRAY) ×1 IMPLANT
SET INTRO CAPELLA COAXIAL (SET/KITS/TRAYS/PACK) IMPLANT
SHEATH ANL2 6FRX45 HC (SHEATH) IMPLANT
SHEATH BRITE TIP 5FRX11 (SHEATH) IMPLANT
STENT LIFESTAR 8X60X80 (Permanent Stent) IMPLANT
STENT LIFESTENT 5F 6X60X135 (Permanent Stent) IMPLANT
SYR MEDRAD MARK 7 150ML (SYRINGE) IMPLANT
TUBING CONTRAST HIGH PRESS 72 (TUBING) IMPLANT
WIRE GUIDERIGHT .035X150 (WIRE) IMPLANT

## 2022-09-07 NOTE — Interval H&P Note (Signed)
History and Physical Interval Note:  09/07/2022 10:21 AM  Jackie Macias  has presented today for surgery, with the diagnosis of LLE Angio   BARD   ASO w ulceration.  The various methods of treatment have been discussed with the patient and family. After consideration of risks, benefits and other options for treatment, the patient has consented to  Procedure(s): Lower Extremity Angiography (Left) as a surgical intervention.  The patient's history has been reviewed, patient examined, no change in status, stable for surgery.  I have reviewed the patient's chart and labs.  Questions were answered to the patient's satisfaction.     Levora Dredge

## 2022-09-07 NOTE — Op Note (Signed)
[facility]  Percutaneous Study/Intervention Procedural Note   Date of Surgery: 09/07/2022  Surgeon:  Levora Dredge  Pre-operative Diagnosis: Atherosclerotic occlusive disease bilateral lower extremities with left lower extremity with rest pain and ulceration.  Post-operative diagnosis:  Same  Procedure(s) Performed:             1.  Introduction catheter into left lower extremity 3rd order catheter placement.  Contrast injection left lower extremity for distal runoff               2.   Percutaneous transluminal angioplasty and stent placement left external iliac artery.             3.  Percutaneous transluminal angioplasty and stent placement superficial femoral and popliteal arteries              4.  Percutaneous transluminal angioplasty left peroneal and tibioperoneal trunk             5.  Percutaneous transluminal angioplasty of the left common femoral artery.               6.  Star close closure right common femoral arteriotomy  Anesthesia: Conscious sedation was administered under my direct supervision by the interventional radiology RN. IV Versed plus fentanyl were utilized. Continuous ECG, pulse oximetry and blood pressure was monitored throughout the entire procedure.  Conscious sedation was for a total of 78 minutes.  Sheath: 6 [place] Ansell sheath right common femoral retrograde  Contrast: 85 cc  Fluoroscopy Time: 13.1 minutes  Indications:  Jackie Macias presents with increasing pain of the left lower extremity.  She has developed an ulceration as well as pain at rest this suggests the patient is having limb threatening ischemia. The risks and benefits are reviewed all questions answered patient agrees to proceed.  Procedure:   Jackie Macias is a 67 y.o. y.o. female who was identified and appropriate procedural time out was performed.  The patient was then placed supine on the table and prepped and draped in the usual sterile fashion.     Ultrasound was placed in the sterile sleeve and the right groin was evaluated the right common femoral artery was echolucent and pulsatile indicating patency.  Image was recorded for the permanent record and under real-time visualization a microneedle was inserted into the common femoral artery followed by the microwire and then the micro-sheath.  A J-wire was then advanced through the micro-sheath and a  5 [place] sheath was then inserted over a J-wire. J-wire was then advanced and a 5 French pigtail catheter was positioned at the level of T12.  AP projection of the aorta was then obtained. Pigtail catheter was repositioned to above the bifurcation and a RAO view of the pelvis was obtained.  Subsequently a pigtail catheter with an Advantage wire was used to cross the aortic bifurcation.  The catheter and wire were advanced down into the left distal external iliac artery. Oblique view of the femoral bifurcation was then obtained and subsequently the wire was reintroduced and the pigtail catheter negotiated into the SFA representing third order catheter placement. Distal runoff was then performed.  6000 units of heparin was then given and allowed to circulate for several minutes.  A 6 French Ansell sheath was advanced up and over the bifurcation and positioned in the proximal external iliac artery  KMP catheter and advantage Glidewire were then negotiated down into the distal popliteal.  Magnified imaging in the RAO projection of the external  iliac artery was then obtained and a 8 mm x 60 mm life star stent is deployed across the greater than 80% stenosis in the external iliac artery.  It is then postdilated with a 7 mm x 60 mm Lutonix drug-eluting balloon inflated to 8 to 10 atm for approximately 1 minute.  Follow-up imaging demonstrates less than 10% residual stenosis.  Working from this projection a 6 mm x 40 mm ultra score balloon is advanced across the greater than 70% stenosis in the left common femoral  artery.  This is a proximal lesion.  There is also a greater than 60% stenosis in the mid common femoral.  Inflation of the ultra score balloon is to 10 atm for 1 minute.  Next a 6 mm x 60 mm Lutonix drug-eluting balloon is advanced across these 2 lesions inflations to 8 atm for 1 minute.  Follow-up imaging demonstrates less than 10% residual stenosis within the common femoral.  The detector was then repositioned to the mid SFA and Hunter's canal.  A 4 mm x 100 mm Lutonix drug-eluting balloon is then positioned across the to greater than 80% stenoses in Hunter's canal and the mid popliteal.  Inflations to 10 atm for 1 minute.  Follow-up imaging demonstrates greater than 70% residual stenosis at both locations.  Initially a 6 mm x 60 mm life stent is deployed proximally and this was postdilated with a 5 mm x 60 mm Lutonix drug-eluting balloon inflated to 8 atm for approximately 1 minute.  Follow-up imaging demonstrates several diffuse lesions extending down to the level of the tibial plateau.  Upon review of this film I elected to place a 6 mm x 200 mm life stent beginning at the level of the tibial plateau extending proximally overlapping the first stent by approximately 1 cm.  This stent was then postdilated with a 4 mm x 100 mm balloon distally inflated to 8 atm for 1 minute.  Then a 5 mm x 150 mm balloon was utilized inflated to 10 atm for approximately 1 minute.  Follow-up imaging demonstrated less than 10% residual stenosis throughout the entire SFA and proximal and mid popliteal.  The detector was then positioned distally.  Imaging of the distal popliteal tibioperoneal trunk and peroneal was then obtained.  There is greater than 90% stenosis throughout the entire tibioperoneal trunk extending approximately 10 to 15 mm into the peroneal.  The peroneal is of generous size and it is the single-vessel runoff to the foot.  A 4 mm x 120 mm Lutonix drug-eluting balloon was advanced into the peroneal by  approximately 20 mm.  Inflation was to 4-5 atmospheres for 1 minute. Follow-up imaging demonstrated patency with less than 10% residual stenosis. Distal runoff was then reassessed and noted to be widely patent.    After review of these images the sheath is pulled into the right external iliac oblique of the common femoral is obtained and a Star close device deployed. There no immediate Complications.  Findings:  The abdominal aorta is opacified with a bolus injection contrast. Renal arteries are single and widely patent without evidence of hemodynamically significant stenosis.  The aorta itself has diffuse disease but no hemodynamically significant lesions. The common iliac arteries are widely patent bilaterally.  The right external iliac artery demonstrates a 50 to 60% stenosis in its midportion.  The left external iliac artery demonstrates an 80% stenosis in its midportion.  The left common femoral demonstrates 2 lesions the proximal lesion is greater than 70% in the  mid lesion is greater than 60%.  The proximal profunda femoris however there is increasing distal disease and poor collateralization to the popliteal.  The SFA does indeed have a significant stenosis in its midportion and at Hunter's canal extending into the proximal and mid popliteal.  Multiple lesions are noted greater than 80%.  The distal popliteal demonstrates increasing disease and the trifurcation is heavily diseased with occlusion of the anterior tibial and posterior tibial arteries throughout their course.  The tibioperoneal trunk and proximal peroneal demonstrates a greater than 90% stenosis.  Distal to this lesion the peroneal is widely patent and actually of generous size.  At the ankle and collateralizes both to the plantar arteries as well as the dorsalis pedis.  Following angioplasty and stent placement of the left external iliac artery there is less than 10% residual stenosis.  Following angioplasty of the common femoral artery  on the left there is less than 10% residual stenosis.  Following angioplasty and stent placement within the mid SFA extending to the mid popliteal there is now wide patency with less than 10% residual stenosis.  Following angioplasty of the tibioperoneal trunk and proximal peroneal there is now less than 10% residual stenosis with preservation of the distal runoff.  Summary: Successful recanalization left lower extremity for limb salvage                           Disposition: Patient was taken to the recovery room in stable condition having tolerated the procedure well.  Earl Lites Elainah Rhyne 09/07/2022,12:20 PM

## 2022-09-07 NOTE — Progress Notes (Signed)
Pt dressing assessed. Head of bed raised to seated position. Pt tolerated well. Meal and ginger ale provided.

## 2022-09-07 NOTE — Progress Notes (Signed)
[employer] contacted and notified that the patient would be ready for pickup around 1445. Operator acknowledged pickup time and stated she would let the driver know.

## 2022-09-08 ENCOUNTER — Encounter: Payer: Self-pay | Admitting: Vascular Surgery

## 2022-09-08 ENCOUNTER — Telehealth (INDEPENDENT_AMBULATORY_CARE_PROVIDER_SITE_OTHER): Payer: Self-pay

## 2022-09-08 ENCOUNTER — Other Ambulatory Visit (INDEPENDENT_AMBULATORY_CARE_PROVIDER_SITE_OTHER): Payer: Self-pay | Admitting: Nurse Practitioner

## 2022-09-08 MED ORDER — TRAMADOL HCL 50 MG PO TABS: 50.0000 mg | ORAL_TABLET | Freq: Four times a day (QID) | ORAL | 0 refills | Status: DC | PRN

## 2022-09-08 NOTE — Telephone Encounter (Signed)
Patient called wanting pain medication sent to her [name] [facility]. Patient was irate that she didn't have any pain medication. She had a left leg angio.   Per [name] NP:  You can let her know that we dont routinely send pain medication post this procedure but I will send tramadol in for her.  Patient has been informed.

## 2022-09-14 DIAGNOSIS — L97522 Non-pressure chronic ulcer of other part of left foot with fat layer exposed: Secondary | ICD-10-CM | POA: Diagnosis not present

## 2022-09-14 DIAGNOSIS — M86172 Other acute osteomyelitis, left ankle and foot: Secondary | ICD-10-CM | POA: Diagnosis not present

## 2022-09-14 DIAGNOSIS — I739 Peripheral vascular disease, unspecified: Secondary | ICD-10-CM | POA: Diagnosis not present

## 2022-09-14 DIAGNOSIS — L03032 Cellulitis of left toe: Secondary | ICD-10-CM | POA: Diagnosis not present

## 2022-09-20 ENCOUNTER — Other Ambulatory Visit (INDEPENDENT_AMBULATORY_CARE_PROVIDER_SITE_OTHER): Payer: Self-pay | Admitting: Vascular Surgery

## 2022-09-20 DIAGNOSIS — I739 Peripheral vascular disease, unspecified: Secondary | ICD-10-CM

## 2022-09-27 ENCOUNTER — Ambulatory Visit (INDEPENDENT_AMBULATORY_CARE_PROVIDER_SITE_OTHER): Payer: Medicare HMO | Admitting: Nurse Practitioner

## 2022-09-27 ENCOUNTER — Ambulatory Visit (INDEPENDENT_AMBULATORY_CARE_PROVIDER_SITE_OTHER): Payer: Medicare HMO

## 2022-09-27 ENCOUNTER — Encounter (INDEPENDENT_AMBULATORY_CARE_PROVIDER_SITE_OTHER): Payer: Self-pay | Admitting: Nurse Practitioner

## 2022-09-27 VITALS — BP 190/83 | HR 70 | Resp 18 | Ht 66.0 in | Wt 180.8 lb

## 2022-09-27 DIAGNOSIS — I10 Essential (primary) hypertension: Secondary | ICD-10-CM | POA: Diagnosis not present

## 2022-09-27 DIAGNOSIS — I6523 Occlusion and stenosis of bilateral carotid arteries: Secondary | ICD-10-CM | POA: Diagnosis not present

## 2022-09-27 DIAGNOSIS — Z9889 Other specified postprocedural states: Secondary | ICD-10-CM

## 2022-09-27 DIAGNOSIS — E782 Mixed hyperlipidemia: Secondary | ICD-10-CM

## 2022-09-27 DIAGNOSIS — I7025 Atherosclerosis of native arteries of other extremities with ulceration: Secondary | ICD-10-CM | POA: Diagnosis not present

## 2022-09-27 DIAGNOSIS — I739 Peripheral vascular disease, unspecified: Secondary | ICD-10-CM | POA: Diagnosis not present

## 2022-09-27 NOTE — Progress Notes (Signed)
Subjective:    Patient ID: Jackie Macias, female    DOB: 08-06-1955, 67 y.o.   MRN: [id number] Chief Complaint  Patient presents with   Follow-up    3 week follow up after procedure with an ABI    The patient returns to the office for followup and review status post angiogram with intervention on 09/07/2022.   Procedure: Procedure(s) Performed:             1.  Introduction catheter into left lower extremity 3rd order catheter placement.  Contrast injection left lower extremity for distal runoff               2.   Percutaneous transluminal angioplasty and stent placement left external iliac artery.             3.  Percutaneous transluminal angioplasty and stent placement superficial femoral and popliteal arteries              4.  Percutaneous transluminal angioplasty left peroneal and tibioperoneal trunk             5.  Percutaneous transluminal angioplasty of the left common femoral artery.               6.  Star close closure right common femoral arteriotomy   The patient notes improvement in the lower extremity symptoms. No interval shortening of the patient's claudication distance or rest pain symptoms. No new ulcers or wounds have occurred since the last visit.  Patient is currently working with her podiatrist regarding treatment of her left foot wound and getting excellent wound care.  She is progressing.  There have been no significant changes to the patient's overall health care.  No documented history of amaurosis fugax or recent TIA symptoms. There are no recent neurological changes noted. No documented history of DVT, PE or superficial thrombophlebitis. The patient denies recent episodes of angina or shortness of breath.   ABI's Rt=0.94 and Lt=0.87  (previous ABI's Rt=0.82 and Lt=0.59) Duplex US of the right lower extremity shows biphasic/monophasic waveforms with waveforms of the left    Review of Systems  Musculoskeletal:  Positive for gait problem.  Skin:   Positive for wound.  All other systems reviewed and are negative.      Objective:   Physical Exam Vitals reviewed.  HENT:     Head: Normocephalic.  Cardiovascular:     Rate and Rhythm: Normal rate.     Pulses:          Dorsalis pedis pulses are detected w/ Doppler on the right side and detected w/ Doppler on the left side.       Posterior tibial pulses are detected w/ Doppler on the right side and detected w/ Doppler on the left side.  Pulmonary:     Effort: Pulmonary effort is normal.  Skin:    General: Skin is warm and dry.  Neurological:     Mental Status: She is alert and oriented to person, place, and time.  Psychiatric:        Mood and Affect: Mood normal.        Behavior: Behavior normal.        Thought Content: Thought content normal.        Judgment: Judgment normal.     BP (!) 190/83 (BP Location: Right Arm)   Pulse 70   Resp 18   Ht 5\' 6"  (1.676 m)   Wt 180 lb 12.8 oz (82 kg)   BMI 29.18  kg/m   Past Medical History:  Diagnosis Date   Arthritis    Bronchitis    COPD (chronic obstructive pulmonary disease) (HCC)    GERD (gastroesophageal reflux disease)    Gout    Hypertension     Social History   Socioeconomic History   Marital status: Single    Spouse name: Not on file   Number of children: Not on file   Years of education: Not on file   Highest education level: Not on file  Occupational History   Not on file  Tobacco Use   Smoking status: Every Day    Packs/day: 1.00    Years: 50.00    Additional pack years: 0.00    Total pack years: 50.00    Types: Cigarettes   Smokeless tobacco: Never  Vaping Use   Vaping Use: Never used  Substance and Sexual Activity   Alcohol use: Yes    Alcohol/week: 3.0 standard drinks of alcohol    Types: 3 Cans of beer per week    Comment: occasional   Drug use: No   Sexual activity: Not on file  Other Topics Concern   Not on file  Social History Narrative   Not on file   Social Determinants of Health    Financial Resource Strain: Not on file  Food Insecurity: Not on file  Transportation Needs: Not on file  Physical Activity: Not on file  Stress: Not on file  Social Connections: Not on file  Intimate Partner Violence: Not on file    Past Surgical History:  Procedure Laterality Date   AMPUTATION Right 07/29/2017   Procedure: AMPUTATION DIGIT/ [address];  Surgeon: Linus Galas, DPM;  Location: [facility];  Service: Podiatry;  Laterality: Right;   AMPUTATION TOE Right 02/18/2021   Procedure: AMPUTATION TOE-GREAT TOE;  Surgeon: Gwyneth Revels, DPM;  Location: [facility];  Service: Podiatry;  Laterality: Right;   CHOLECYSTECTOMY     facial biopsy Right    JOINT REPLACEMENT Right 12/2016   THR   LOWER EXTREMITY ANGIOGRAPHY Right 09/27/2017   Procedure: LOWER EXTREMITY ANGIOGRAPHY;  Surgeon: Renford Dills, MD;  Location: [facility];  Service: Cardiovascular;  Laterality: Right;   LOWER EXTREMITY ANGIOGRAPHY Right 06/17/2020   Procedure: LOWER EXTREMITY ANGIOGRAPHY;  Surgeon: Renford Dills, MD;  Location: [facility];  Service: Cardiovascular;  Laterality: Right;   LOWER EXTREMITY ANGIOGRAPHY Right 02/18/2021   Procedure: LOWER EXTREMITY ANGIOGRAPHY;  Surgeon: Renford Dills, MD;  Location: [facility];  Service: Cardiovascular;  Laterality: Right;   LOWER EXTREMITY ANGIOGRAPHY Left 09/07/2022   Procedure: Lower Extremity Angiography;  Surgeon: Renford Dills, MD;  Location: [facility];  Service: Cardiovascular;  Laterality: Left;   TONSILLECTOMY     TOTAL HIP ARTHROPLASTY Right 01/17/2017   Procedure: TOTAL HIP ARTHROPLASTY ANTERIOR APPROACH;  Surgeon: Lyndle Herrlich, MD;  Location: [facility];  Service: Orthopedics;  Laterality: Right;    Family History  Problem Relation Age of Onset   Breast cancer Sister 38   Breast cancer Other     Allergies  Allergen Reactions   Lisinopril-Hydrochlorothiazide Other (See Comments)       Latest Ref  Rng & Units 02/19/2021    8:35 AM 02/18/2021    4:55 AM 02/17/2021    4:20 AM  CBC  WBC 4.0 - 10.5 K/uL 5.3  4.8  5.3   Hemoglobin 12.0 - 15.0 g/dL 60.4  54.0  98.1   Hematocrit 36.0 -  46.0 % 33.3  31.5  31.7   Platelets 150 - 400 K/uL 309  329  308       CMP     Component Value Date/Time   NA 134 (L) 02/18/2021 0455   K 3.9 02/18/2021 0455   CL 104 02/18/2021 0455   CO2 24 02/18/2021 0455   GLUCOSE 108 (H) 02/18/2021 0455   BUN 9 09/07/2022 1008   CREATININE 0.66 09/07/2022 1008   CALCIUM 9.2 02/18/2021 0455   PROT 6.6 02/15/2021 0601   ALBUMIN 3.0 (L) 02/15/2021 0601   AST 15 02/15/2021 0601   ALT 8 02/15/2021 0601   ALKPHOS 49 02/15/2021 0601   BILITOT 0.5 02/15/2021 0601   GFRNONAA >60 09/07/2022 1008     VAS [place] ABI WITH/WO TBI  Result Date: 08/13/2022  LOWER EXTREMITY DOPPLER STUDY Patient Name:  Jackie Macias  Date of Exam:   08/12/2022 Medical Rec #: [id number]        Accession #:    [id number] Date of Birth: [birth year]        Patient Gender: F Patient Age:   11 years Exam Location:  [facility] Procedure:      VAS [place] ABI WITH/WO TBI Referring Phys: Levora Dredge --------------------------------------------------------------------------------  Indications: Claudication, and peripheral artery disease. High Risk Factors: Hypertension, hyperlipidemia, current smoker.  Vascular Interventions: 02/23/2021 Right SFA stent and tp trunk PTA                          06/17/20: Right SFA, popliteal & peroneal artery PTA;. Performing Technologist: Hardie Lora RVT  Examination Guidelines: A complete evaluation includes at minimum, Doppler waveform signals and systolic blood pressure reading at the level of bilateral brachial, anterior tibial, and posterior tibial arteries, when vessel segments are accessible. Bilateral testing is considered an integral part of a complete examination. Photoelectric Plethysmograph (PPG) waveforms and toe systolic pressure readings are  included as required and additional duplex testing as needed. Limited examinations for reoccurring indications may be performed as noted.  ABI Findings: +--------+------------------+-----+--------+--------+ Right   Rt Pressure (mmHg)IndexWaveformComment  +--------+------------------+-----+--------+--------+ [id number]                                     +--------+------------------+-----+--------+--------+ PTA     117               0.67 biphasic         +--------+------------------+-----+--------+--------+ PERO    143               0.82 biphasic         +--------+------------------+-----+--------+--------+ DP      126               0.72 biphasic         +--------+------------------+-----+--------+--------+ +---------+------------------+-----+----------+-------+ Left     Lt Pressure (mmHg)IndexWaveform  Comment +---------+------------------+-----+----------+-------+ Brachial 168                                      +---------+------------------+-----+----------+-------+ PTA      88                0.51 monophasic        +---------+------------------+-----+----------+-------+ DP       103  0.59 monophasic        +---------+------------------+-----+----------+-------+ Great Toe42                0.24                   +---------+------------------+-----+----------+-------+ +-------+-----------+-----------+------------+------------+ ABI/TBIToday's ABIToday's TBIPrevious ABIPrevious TBI +-------+-----------+-----------+------------+------------+ Right  0.82       amputation 0.91        amputation   +-------+-----------+-----------+------------+------------+ Left   0.61       0.35       0.59        0.24         +-------+-----------+-----------+------------+------------+  Bilateral ABIs appear essentially unchanged compared to prior study on 07/28/2020.  Summary: Right: Resting right ankle-brachial index indicates mild right lower  extremity arterial disease. Left: Resting left ankle-brachial index indicates moderate left lower extremity arterial disease. The left toe-brachial index is abnormal. *See table(s) above for measurements and observations.  Electronically signed by Levora Dredge MD on 08/13/2022 at 8:35:15 AM.    Final        Assessment & Plan:   1. Atherosclerosis of native arteries of the extremities with ulceration (HCC) Recommend:  The patient is status post successful angiogram with intervention.  The patient reports that the claudication symptoms and leg pain has improved.   The patient denies lifestyle limiting changes at this point in time.  No further invasive studies, angiography or surgery at this time The patient should continue walking and begin a more formal exercise program.  The patient should continue antiplatelet therapy and aggressive treatment of the lipid abnormalities  Continued surveillance is indicated as atherosclerosis is likely to progress with time.    Patient should undergo noninvasive studies as ordered. The patient will follow up with me to review the studies.   2. Essential hypertension Continue antihypertensive medications as already ordered, these medications have been reviewed and there are no changes at this time.  She currently has medication but is running low, we will reach out to her PCP.  Advised to discontinue she may need to visit PCP for evaluation  3. Mixed hyperlipidemia Continue statin as ordered and reviewed, no changes at this time  4. Bilateral carotid artery stenosis Based on CT scan in 2020 the patient had a 65% stenosis of the right ICA with a 60% on the left ICA.  It does not appear that she has had any follow-up of these issues since then.  Will obtain a carotid duplex at her follow-up.   Current Outpatient Medications on File Prior to Visit  Medication Sig Dispense Refill   albuterol (VENTOLIN HFA) 108 (90 Base) MCG/ACT inhaler Inhale 2 puffs  into the lungs every 6 (six) hours as needed for wheezing or shortness of breath. 18 g 6   amLODipine (NORVASC) 5 MG tablet Take 1 tablet (5 mg total) by mouth daily. 90 tablet 3   aspirin EC 81 MG tablet Take 1 tablet (81 mg total) by mouth daily. 150 tablet 2   clopidogrel (PLAVIX) 75 MG tablet Take 1 tablet (75 mg total) by mouth daily. 30 tablet 6   gabapentin (NEURONTIN) 300 MG capsule Take 1 capsule by mouth every morning and take 2 capsules by mouth at bedtime daily 270 capsule 1   traMADol (ULTRAM) 50 MG tablet Take 1 tablet (50 mg total) by mouth every 6 (six) hours as needed. 20 tablet 0   Acetaminophen 500 MG capsule Take 1-2 tablets by mouth every 6 (six) hours  as needed. (Patient not taking: Reported on 09/07/2022)     cetirizine (ZYRTEC) 5 MG tablet Take 5 mg by mouth daily. (Patient not taking: Reported on 08/12/2022)     HYDROcodone-acetaminophen (NORCO/VICODIN) 5-325 MG tablet Take 1 tablet by mouth every 6 (six) hours as needed. (Patient not taking: Reported on 08/12/2022)     ipratropium-albuterol (DUONEB) 0.5-2.5 (3) MG/3ML SOLN Take 3 mLs by nebulization every 6 (six) hours as needed. (Patient not taking: Reported on 08/12/2022) 360 mL 3   Iron-FA-B Cmp-C-Biot-Probiotic (FUSION PLUS) CAPS Take 1 tablet by mouth daily with breakfast. (Patient not taking: Reported on 08/12/2022)     nicotine (NICODERM CQ - DOSED IN MG/24 HOURS) 14 mg/24hr patch Place 1 patch (14 mg total) onto the skin daily. (Patient not taking: Reported on 08/12/2022) 28 patch 0   pantoprazole (PROTONIX) 40 MG tablet Take 1 tablet (40 mg total) by mouth every morning. (Patient not taking: Reported on 08/12/2022) 90 tablet 3   rosuvastatin (CRESTOR) 20 MG tablet Take 1 tablet (20 mg total) by mouth daily. (Patient not taking: Reported on 08/12/2022) 90 tablet 3   No current facility-administered medications on file prior to visit.    There are no Patient Instructions on file for this visit. No follow-ups on  file.   Georgiana Spinner, NP

## 2022-09-29 LAB — VAS US ABI WITH/WO TBI
Left ABI: 0.87
Right ABI: 0.94

## 2022-09-30 ENCOUNTER — Other Ambulatory Visit: Payer: Self-pay | Admitting: Family

## 2022-09-30 MED ORDER — GABAPENTIN 300 MG PO CAPS: ORAL_CAPSULE | ORAL | 1 refills | Status: DC

## 2022-09-30 MED ORDER — CLOPIDOGREL BISULFATE 75 MG PO TABS: 75.0000 mg | ORAL_TABLET | Freq: Every day | ORAL | 6 refills | Status: DC

## 2022-09-30 MED ORDER — ROSUVASTATIN CALCIUM 20 MG PO TABS: 20.0000 mg | ORAL_TABLET | Freq: Every day | ORAL | 3 refills | Status: DC

## 2022-09-30 MED ORDER — ALBUTEROL SULFATE HFA 108 (90 BASE) MCG/ACT IN AERS: 2.0000 | INHALATION_SPRAY | Freq: Four times a day (QID) | RESPIRATORY_TRACT | 6 refills | Status: DC | PRN

## 2022-09-30 MED ORDER — PANTOPRAZOLE SODIUM 40 MG PO TBEC: 40.0000 mg | DELAYED_RELEASE_TABLET | Freq: Every morning | ORAL | 3 refills | Status: DC

## 2022-09-30 MED ORDER — AMLODIPINE BESYLATE 5 MG PO TABS: 5.0000 mg | ORAL_TABLET | Freq: Every day | ORAL | 3 refills | Status: DC

## 2022-10-05 DIAGNOSIS — L97522 Non-pressure chronic ulcer of other part of left foot with fat layer exposed: Secondary | ICD-10-CM | POA: Diagnosis not present

## 2022-10-05 DIAGNOSIS — L03032 Cellulitis of left toe: Secondary | ICD-10-CM | POA: Diagnosis not present

## 2022-10-05 DIAGNOSIS — I739 Peripheral vascular disease, unspecified: Secondary | ICD-10-CM | POA: Diagnosis not present

## 2022-11-08 ENCOUNTER — Telehealth: Payer: Self-pay

## 2022-11-22 ENCOUNTER — Ambulatory Visit (INDEPENDENT_AMBULATORY_CARE_PROVIDER_SITE_OTHER): Payer: Medicare HMO | Admitting: Family

## 2022-11-22 ENCOUNTER — Encounter: Payer: Self-pay | Admitting: Family

## 2022-11-22 VITALS — BP 150/80 | HR 71 | Ht 66.0 in | Wt 172.8 lb

## 2022-11-22 DIAGNOSIS — M1A00X0 Idiopathic chronic gout, unspecified site, without tophus (tophi): Secondary | ICD-10-CM

## 2022-11-22 DIAGNOSIS — R634 Abnormal weight loss: Secondary | ICD-10-CM | POA: Diagnosis not present

## 2022-11-22 DIAGNOSIS — E782 Mixed hyperlipidemia: Secondary | ICD-10-CM

## 2022-11-22 DIAGNOSIS — D509 Iron deficiency anemia, unspecified: Secondary | ICD-10-CM

## 2022-11-22 DIAGNOSIS — E039 Hypothyroidism, unspecified: Secondary | ICD-10-CM

## 2022-11-22 DIAGNOSIS — E538 Deficiency of other specified B group vitamins: Secondary | ICD-10-CM

## 2022-11-22 DIAGNOSIS — E559 Vitamin D deficiency, unspecified: Secondary | ICD-10-CM | POA: Diagnosis not present

## 2022-11-22 DIAGNOSIS — R7303 Prediabetes: Secondary | ICD-10-CM | POA: Diagnosis not present

## 2022-11-22 MED ORDER — GABAPENTIN 300 MG PO CAPS: ORAL_CAPSULE | ORAL | 1 refills | Status: DC

## 2022-11-22 MED ORDER — PANTOPRAZOLE SODIUM 40 MG PO TBEC: 40.0000 mg | DELAYED_RELEASE_TABLET | Freq: Every morning | ORAL | 3 refills | Status: DC

## 2022-11-22 MED ORDER — ROSUVASTATIN CALCIUM 20 MG PO TABS: 20.0000 mg | ORAL_TABLET | Freq: Every day | ORAL | 3 refills | Status: DC

## 2022-11-22 MED ORDER — ALBUTEROL SULFATE HFA 108 (90 BASE) MCG/ACT IN AERS: 2.0000 | INHALATION_SPRAY | Freq: Four times a day (QID) | RESPIRATORY_TRACT | 6 refills | Status: AC | PRN

## 2022-11-22 MED ORDER — IPRATROPIUM-ALBUTEROL 0.5-2.5 (3) MG/3ML IN SOLN: 3.0000 mL | Freq: Four times a day (QID) | RESPIRATORY_TRACT | 3 refills | Status: DC | PRN

## 2022-11-22 MED ORDER — TRAMADOL HCL 50 MG PO TABS: 50.0000 mg | ORAL_TABLET | Freq: Three times a day (TID) | ORAL | 0 refills | Status: DC | PRN

## 2022-11-22 MED ORDER — CLOPIDOGREL BISULFATE 75 MG PO TABS: 75.0000 mg | ORAL_TABLET | Freq: Every day | ORAL | 6 refills | Status: DC

## 2022-11-22 MED ORDER — AMLODIPINE BESYLATE 5 MG PO TABS: 5.0000 mg | ORAL_TABLET | Freq: Every day | ORAL | 3 refills | Status: DC

## 2022-11-22 NOTE — Assessment & Plan Note (Signed)
Patient stable.  Well controlled with current therapy.   Continue current meds.  

## 2022-11-22 NOTE — Assessment & Plan Note (Signed)

## 2022-11-22 NOTE — Assessment & Plan Note (Signed)
Checking labs today.  Will continue supplements as needed.  

## 2022-11-22 NOTE — Progress Notes (Signed)
Established Patient Office Visit  Subjective:  Patient ID: Jackie Macias, female    DOB: Jan 12, 1956  Age: 67 y.o. MRN: [id number]  Chief Complaint  Patient presents with   Annual Exam    Physical    Patient here today for her AWV.  She has been having some significant issues with weight loss and continued falls since her last appointment.   She says that she has not had any changes to her appetite, says that she has been eating normally.  Despite this, she has lost 40 pounds in the last year, and 30 since December without any attempts to do so.   She is at very high cancer risk, with extensive smoking history and personal history of colon polyps.  Has not had any imaging in the past year of abdomen/pelvis.   She has fallen she *thinks* 3 times since our last visit (in December).  She says that she trips over things.  She is utilizing a cane in office today.  No other concerns at this time.   Past Medical History:  Diagnosis Date   Arthritis    Bronchitis    COPD (chronic obstructive pulmonary disease) (HCC)    GERD (gastroesophageal reflux disease)    Gout    Hypertension     Past Surgical History:  Procedure Laterality Date   AMPUTATION Right 07/29/2017   Procedure: AMPUTATION DIGIT/ [address]2536;  Surgeon: Linus Galas, DPM;  Location: [facility];  Service: Podiatry;  Laterality: Right;   AMPUTATION TOE Right 02/18/2021   Procedure: AMPUTATION TOE-GREAT TOE;  Surgeon: Gwyneth Revels, DPM;  Location: [facility];  Service: Podiatry;  Laterality: Right;   CHOLECYSTECTOMY     facial biopsy Right    JOINT REPLACEMENT Right 12/2016   THR   LOWER EXTREMITY ANGIOGRAPHY Right 09/27/2017   Procedure: LOWER EXTREMITY ANGIOGRAPHY;  Surgeon: Renford Dills, MD;  Location: [facility];  Service: Cardiovascular;  Laterality: Right;   LOWER EXTREMITY ANGIOGRAPHY Right 06/17/2020   Procedure: LOWER EXTREMITY ANGIOGRAPHY;  Surgeon: Renford Dills, MD;  Location: [facility];  Service: Cardiovascular;  Laterality: Right;   LOWER EXTREMITY ANGIOGRAPHY Right 02/18/2021   Procedure: LOWER EXTREMITY ANGIOGRAPHY;  Surgeon: Renford Dills, MD;  Location: [facility];  Service: Cardiovascular;  Laterality: Right;   LOWER EXTREMITY ANGIOGRAPHY Left 09/07/2022   Procedure: Lower Extremity Angiography;  Surgeon: Renford Dills, MD;  Location: [facility];  Service: Cardiovascular;  Laterality: Left;   TONSILLECTOMY     TOTAL HIP ARTHROPLASTY Right 01/17/2017   Procedure: TOTAL HIP ARTHROPLASTY ANTERIOR APPROACH;  Surgeon: Lyndle Herrlich, MD;  Location: [facility];  Service: Orthopedics;  Laterality: Right;    Social History   Socioeconomic History   Marital status: Single    Spouse name: Not on file   Number of children: Not on file   Years of education: Not on file   Highest education level: Not on file  Occupational History   Not on file  Tobacco Use   Smoking status: Every Day    Current packs/day: 1.00    Average packs/day: 1 pack/day for 50.0 years (50.0 ttl pk-yrs)    Types: Cigarettes   Smokeless tobacco: Never  Vaping Use   Vaping status: Never Used  Substance and Sexual Activity   Alcohol use: Yes    Alcohol/week: 3.0 standard drinks of alcohol    Types: 3 Cans of beer per week    Comment: occasional  Drug use: No   Sexual activity: Not on file  Other Topics Concern   Not on file  Social History Narrative   Not on file   Social Determinants of Health   Financial Resource Strain: Not on file  Food Insecurity: Not on file  Transportation Needs: Not on file  Physical Activity: Not on file  Stress: Not on file  Social Connections: Not on file  Intimate Partner Violence: Not on file    Family History  Problem Relation Age of Onset   Breast cancer Sister 73   Breast cancer Other     Allergies  Allergen Reactions   Lisinopril-Hydrochlorothiazide Other (See Comments)    Review of Systems  Constitutional:   Positive for malaise/fatigue and weight loss.  All other systems reviewed and are negative.      Objective:   BP (!) 150/80   Pulse 71   Ht 5\' 6"  (1.676 m)   Wt 172 lb 12.8 oz (78.4 kg)   SpO2 95%   BMI 27.89 kg/m   Vitals:   11/22/22 0953  BP: (!) 150/80  Pulse: 71  Height: 5\' 6"  (1.676 m)  Weight: 172 lb 12.8 oz (78.4 kg)  SpO2: 95%  BMI (Calculated): 27.9    Physical Exam Vitals and nursing note reviewed.  Constitutional:      Appearance: Normal appearance. She is normal weight. She is ill-appearing.  HENT:     Head: Normocephalic.  Eyes:     Extraocular Movements: Extraocular movements intact.     Conjunctiva/sclera: Conjunctivae normal.     Pupils: Pupils are equal, round, and reactive to light.  Cardiovascular:     Rate and Rhythm: Normal rate.  Pulmonary:     Effort: Pulmonary effort is normal.  Musculoskeletal:        General: Normal range of motion.  Neurological:     General: No focal deficit present.     Mental Status: She is alert and oriented to person, place, and time. Mental status is at baseline.  Psychiatric:        Mood and Affect: Mood normal.        Behavior: Behavior normal.        Thought Content: Thought content normal.        Judgment: Judgment normal.      No results found for any visits on 11/22/22.  Recent Results (from the past 2160 hour(s))  BUN     Status: None   Collection Time: 09/07/22 10:08 AM  Result Value Ref Range   BUN 9 8 - 23 mg/dL    Comment: Performed at [facility], [address] [place], [place] [address]  Creatinine, serum     Status: None   Collection Time: 09/07/22 10:08 AM  Result Value Ref Range   Creatinine, Ser 0.66 0.44 - 1.00 mg/dL   GFR, Estimated >60 >45 mL/min    Comment: (NOTE) Calculated using the CKD-EPI Creatinine Equation (2021) Performed at [facility], [address] [place], [place] [address]   VAS [place] ABI WITH/WO TBI     Status: None   Collection Time:  09/27/22  1:40 PM  Result Value Ref Range   Right ABI 0.94    Left ABI 0.87        Assessment & Plan:   Problem List Items Addressed This Visit       Active Problems   Iron deficiency anemia    Checking labs today.  Will continue supplements as needed.  Relevant Orders   CMP14+EGFR   CBC with Differential/Platelet   Iron, TIBC and Ferritin Panel   Mixed hyperlipidemia    Checking labs today.  Continue current therapy for lipid control. Will modify as needed based on labwork results.        Relevant Medications   amLODipine (NORVASC) 5 MG tablet   rosuvastatin (CRESTOR) 20 MG tablet   Other Relevant Orders   Lipid panel   CMP14+EGFR   CBC with Differential/Platelet   Gout    Patient stable.  Well controlled with current therapy.   Continue current meds.       Prediabetes    A1C Continues to be in prediabetic ranges.  Will reassess at follow up after next lab check.  Patient counseled on dietary choices and verbalized understanding.  Patient educated on foods that contain carbohydrates and the need to decrease intake.  We discussed prediabetes, and what it means and the need for strict dietary control to prevent progression to type 2 diabetes.  Advised to decrease intake of sugary drinks, including sodas, sweet tea, and some juices, and of starch and sugar heavy foods (ie., potatoes, rice, bread, pasta, desserts). She verbalizes understanding and agreement with the changes discussed today.        Relevant Orders   CMP14+EGFR   Hemoglobin A1c   CBC with Differential/Platelet   Vitamin D deficiency, unspecified    Checking labs today.  Will continue supplements as needed.      Relevant Orders   VITAMIN D 25 Hydroxy (Vit-D Deficiency, Fractures)   CMP14+EGFR   CBC with Differential/Platelet   Other Visit Diagnoses     Abnormal weight loss    -  Primary   will get CT scan of abdomen/pelvis and of chest to evaluate for possible causes.  I am very  concerned about her weight loss.   Relevant Orders   CT ABDOMEN PELVIS W CONTRAST   CT CHEST W CONTRAST   CMP14+EGFR   CBC with Differential/Platelet   B12 deficiency due to diet       Checking labs today.  Will continue supplements as needed.   Relevant Orders   CMP14+EGFR   Vitamin B12   CBC with Differential/Platelet   Hypothyroidism (acquired)       Relevant Orders   CMP14+EGFR   TSH   CBC with Differential/Platelet       Return in about 1 month (around 12/23/2022) for F/U.   Total time spent: 30 minutes  Miki Kins, FNP  11/22/2022   This document may have been prepared by [facility] Voice Recognition software and as such may include unintentional dictation errors.

## 2022-11-22 NOTE — Assessment & Plan Note (Signed)
Checking labs today.  Continue current therapy for lipid control. Will modify as needed based on labwork results.  

## 2022-11-23 ENCOUNTER — Other Ambulatory Visit: Payer: Self-pay

## 2022-11-23 LAB — CMP14+EGFR
ALT: 7 IU/L (ref 0–32)
AST: 15 IU/L (ref 0–40)
Albumin: 4.1 g/dL (ref 3.9–4.9)
Alkaline Phosphatase: 77 IU/L (ref 44–121)
BUN/Creatinine Ratio: 26 (ref 12–28)
BUN: 18 mg/dL (ref 8–27)
Bilirubin Total: 0.3 mg/dL (ref 0.0–1.2)
CO2: 21 mmol/L (ref 20–29)
Calcium: 9.6 mg/dL (ref 8.7–10.3)
Chloride: 104 mmol/L (ref 96–106)
Creatinine, Ser: 0.69 mg/dL (ref 0.57–1.00)
Globulin, Total: 3 g/dL (ref 1.5–4.5)
Glucose: 92 mg/dL (ref 70–99)
Potassium: 4.2 mmol/L (ref 3.5–5.2)
Sodium: 138 mmol/L (ref 134–144)
Total Protein: 7.1 g/dL (ref 6.0–8.5)
eGFR: 94 mL/min/{1.73_m2} (ref >59)

## 2022-11-23 LAB — IRON,TIBC AND FERRITIN PANEL
Ferritin: 60 ng/mL (ref 15–150)
Iron Saturation: 17 % (ref 15–55)
Iron: 58 ug/dL (ref 27–139)
Total Iron Binding Capacity: 345 ug/dL (ref 250–450)
UIBC: 287 ug/dL (ref 118–369)

## 2022-11-23 LAB — VITAMIN B12: Vitamin B-12: 481 pg/mL (ref 232–1245)

## 2022-11-23 LAB — LIPID PANEL
Chol/HDL Ratio: 2.7 ratio (ref 0.0–4.4)
Cholesterol, Total: 167 mg/dL (ref 100–199)
HDL: 61 mg/dL (ref >39)
LDL Chol Calc (NIH): 94 mg/dL (ref 0–99)
Triglycerides: 61 mg/dL (ref 0–149)
VLDL Cholesterol Cal: 12 mg/dL (ref 5–40)

## 2022-11-23 LAB — VITAMIN D 25 HYDROXY (VIT D DEFICIENCY, FRACTURES): Vit D, 25-Hydroxy: 15.4 ng/mL — ABNORMAL LOW (ref 30.0–100.0)

## 2022-11-23 LAB — TSH: TSH: 1.11 u[IU]/mL (ref 0.450–4.500)

## 2022-11-23 LAB — HEMOGLOBIN A1C
Est. average glucose Bld gHb Est-mCnc: 117 mg/dL
Hgb A1c MFr Bld: 5.7 % — ABNORMAL HIGH (ref 4.8–5.6)

## 2022-11-23 MED ORDER — VITAMIN D (ERGOCALCIFEROL) 1.25 MG (50000 UNIT) PO CAPS: 50000.0000 [IU] | ORAL_CAPSULE | ORAL | 3 refills | Status: AC

## 2022-11-30 DIAGNOSIS — Z89421 Acquired absence of other right toe(s): Secondary | ICD-10-CM | POA: Diagnosis not present

## 2022-11-30 DIAGNOSIS — B351 Tinea unguium: Secondary | ICD-10-CM | POA: Diagnosis not present

## 2022-11-30 DIAGNOSIS — L97522 Non-pressure chronic ulcer of other part of left foot with fat layer exposed: Secondary | ICD-10-CM | POA: Diagnosis not present

## 2022-11-30 DIAGNOSIS — D2372 Other benign neoplasm of skin of left lower limb, including hip: Secondary | ICD-10-CM | POA: Diagnosis not present

## 2022-11-30 DIAGNOSIS — M86172 Other acute osteomyelitis, left ankle and foot: Secondary | ICD-10-CM | POA: Diagnosis not present

## 2022-11-30 DIAGNOSIS — I739 Peripheral vascular disease, unspecified: Secondary | ICD-10-CM | POA: Diagnosis not present

## 2022-11-30 DIAGNOSIS — L851 Acquired keratosis [keratoderma] palmaris et plantaris: Secondary | ICD-10-CM | POA: Diagnosis not present

## 2022-12-07 ENCOUNTER — Other Ambulatory Visit: Payer: Medicare HMO

## 2022-12-20 ENCOUNTER — Ambulatory Visit: Payer: Medicare HMO | Admitting: Family

## 2023-01-03 ENCOUNTER — Other Ambulatory Visit: Payer: Self-pay | Admitting: Family

## 2023-01-03 ENCOUNTER — Other Ambulatory Visit (INDEPENDENT_AMBULATORY_CARE_PROVIDER_SITE_OTHER): Payer: Self-pay | Admitting: Nurse Practitioner

## 2023-01-03 ENCOUNTER — Telehealth: Payer: Self-pay | Admitting: Family

## 2023-01-03 DIAGNOSIS — Z1231 Encounter for screening mammogram for malignant neoplasm of breast: Secondary | ICD-10-CM

## 2023-01-03 DIAGNOSIS — Z9889 Other specified postprocedural states: Secondary | ICD-10-CM

## 2023-01-03 DIAGNOSIS — I6523 Occlusion and stenosis of bilateral carotid arteries: Secondary | ICD-10-CM

## 2023-01-03 NOTE — Telephone Encounter (Signed)
error 

## 2023-01-06 ENCOUNTER — Ambulatory Visit (INDEPENDENT_AMBULATORY_CARE_PROVIDER_SITE_OTHER): Payer: Medicare HMO | Admitting: Vascular Surgery

## 2023-01-06 ENCOUNTER — Encounter (INDEPENDENT_AMBULATORY_CARE_PROVIDER_SITE_OTHER): Payer: Medicare HMO

## 2023-01-17 ENCOUNTER — Encounter (INDEPENDENT_AMBULATORY_CARE_PROVIDER_SITE_OTHER): Payer: Medicare HMO

## 2023-01-18 ENCOUNTER — Ambulatory Visit (INDEPENDENT_AMBULATORY_CARE_PROVIDER_SITE_OTHER): Payer: Medicare HMO

## 2023-01-18 DIAGNOSIS — J439 Emphysema, unspecified: Secondary | ICD-10-CM | POA: Diagnosis not present

## 2023-01-18 DIAGNOSIS — R634 Abnormal weight loss: Secondary | ICD-10-CM

## 2023-01-18 MED ORDER — IOHEXOL 300 MG/ML  SOLN
100.0000 mL | Freq: Once | INTRAMUSCULAR | Status: AC | PRN
  Administered 2023-01-18: 100 mL via INTRAVENOUS

## 2023-01-25 ENCOUNTER — Ambulatory Visit: Payer: Medicare HMO | Admitting: Family

## 2023-02-02 ENCOUNTER — Ambulatory Visit: Payer: Medicare HMO | Admitting: Family

## 2023-03-06 ENCOUNTER — Emergency Department: Payer: Medicare HMO

## 2023-03-06 ENCOUNTER — Observation Stay: Payer: Medicare HMO

## 2023-03-06 ENCOUNTER — Inpatient Hospital Stay
Admission: EM | Admit: 2023-03-06 | Discharge: 2023-03-14 | DRG: 270 | Disposition: A | Discharge status: Home Health | Payer: Medicare HMO | Attending: Internal Medicine | Admitting: Internal Medicine

## 2023-03-06 ENCOUNTER — Other Ambulatory Visit: Payer: Self-pay

## 2023-03-06 DIAGNOSIS — E876 Hypokalemia: Secondary | ICD-10-CM | POA: Diagnosis present

## 2023-03-06 DIAGNOSIS — J181 Lobar pneumonia, unspecified organism: Secondary | ICD-10-CM | POA: Diagnosis not present

## 2023-03-06 DIAGNOSIS — T82856A Stenosis of peripheral vascular stent, initial encounter: Secondary | ICD-10-CM | POA: Diagnosis present

## 2023-03-06 DIAGNOSIS — Z9889 Other specified postprocedural states: Secondary | ICD-10-CM | POA: Diagnosis not present

## 2023-03-06 DIAGNOSIS — I82412 Acute embolism and thrombosis of left femoral vein: Secondary | ICD-10-CM | POA: Diagnosis present

## 2023-03-06 DIAGNOSIS — R296 Repeated falls: Secondary | ICD-10-CM | POA: Diagnosis present

## 2023-03-06 DIAGNOSIS — M2142 Flat foot [pes planus] (acquired), left foot: Secondary | ICD-10-CM | POA: Diagnosis not present

## 2023-03-06 DIAGNOSIS — M7122 Synovial cyst of popliteal space [Baker], left knee: Secondary | ICD-10-CM | POA: Diagnosis not present

## 2023-03-06 DIAGNOSIS — E782 Mixed hyperlipidemia: Secondary | ICD-10-CM | POA: Diagnosis present

## 2023-03-06 DIAGNOSIS — I82432 Acute embolism and thrombosis of left popliteal vein: Secondary | ICD-10-CM | POA: Diagnosis present

## 2023-03-06 DIAGNOSIS — L89619 Pressure ulcer of right heel, unspecified stage: Secondary | ICD-10-CM | POA: Diagnosis present

## 2023-03-06 DIAGNOSIS — Z6836 Body mass index (BMI) 36.0-36.9, adult: Secondary | ICD-10-CM

## 2023-03-06 DIAGNOSIS — T82868A Thrombosis of vascular prosthetic devices, implants and grafts, initial encounter: Secondary | ICD-10-CM | POA: Diagnosis present

## 2023-03-06 DIAGNOSIS — D509 Iron deficiency anemia, unspecified: Secondary | ICD-10-CM | POA: Diagnosis present

## 2023-03-06 DIAGNOSIS — Z96641 Presence of right artificial hip joint: Secondary | ICD-10-CM | POA: Diagnosis present

## 2023-03-06 DIAGNOSIS — M25461 Effusion, right knee: Secondary | ICD-10-CM | POA: Diagnosis not present

## 2023-03-06 DIAGNOSIS — M79672 Pain in left foot: Secondary | ICD-10-CM | POA: Diagnosis present

## 2023-03-06 DIAGNOSIS — F17210 Nicotine dependence, cigarettes, uncomplicated: Secondary | ICD-10-CM | POA: Diagnosis present

## 2023-03-06 DIAGNOSIS — Y831 Surgical operation with implant of artificial internal device as the cause of abnormal reaction of the patient, or of later complication, without mention of misadventure at the time of the procedure: Secondary | ICD-10-CM | POA: Diagnosis present

## 2023-03-06 DIAGNOSIS — T82898A Other specified complication of vascular prosthetic devices, implants and grafts, initial encounter: Secondary | ICD-10-CM | POA: Diagnosis present

## 2023-03-06 DIAGNOSIS — R918 Other nonspecific abnormal finding of lung field: Secondary | ICD-10-CM | POA: Diagnosis not present

## 2023-03-06 DIAGNOSIS — M1611 Unilateral primary osteoarthritis, right hip: Secondary | ICD-10-CM | POA: Diagnosis present

## 2023-03-06 DIAGNOSIS — Z803 Family history of malignant neoplasm of breast: Secondary | ICD-10-CM

## 2023-03-06 DIAGNOSIS — R531 Weakness: Secondary | ICD-10-CM

## 2023-03-06 DIAGNOSIS — Z91128 Patient's intentional underdosing of medication regimen for other reason: Secondary | ICD-10-CM

## 2023-03-06 DIAGNOSIS — M1711 Unilateral primary osteoarthritis, right knee: Secondary | ICD-10-CM | POA: Diagnosis present

## 2023-03-06 DIAGNOSIS — T39016A Underdosing of aspirin, initial encounter: Secondary | ICD-10-CM | POA: Diagnosis present

## 2023-03-06 DIAGNOSIS — R509 Fever, unspecified: Secondary | ICD-10-CM | POA: Diagnosis not present

## 2023-03-06 DIAGNOSIS — W010XXA Fall on same level from slipping, tripping and stumbling without subsequent striking against object, initial encounter: Secondary | ICD-10-CM | POA: Diagnosis present

## 2023-03-06 DIAGNOSIS — I779 Disorder of arteries and arterioles, unspecified: Secondary | ICD-10-CM | POA: Diagnosis present

## 2023-03-06 DIAGNOSIS — E871 Hypo-osmolality and hyponatremia: Secondary | ICD-10-CM | POA: Diagnosis present

## 2023-03-06 DIAGNOSIS — I739 Peripheral vascular disease, unspecified: Secondary | ICD-10-CM | POA: Diagnosis not present

## 2023-03-06 DIAGNOSIS — N3289 Other specified disorders of bladder: Secondary | ICD-10-CM | POA: Diagnosis not present

## 2023-03-06 DIAGNOSIS — I10 Essential (primary) hypertension: Secondary | ICD-10-CM | POA: Diagnosis present

## 2023-03-06 DIAGNOSIS — J449 Chronic obstructive pulmonary disease, unspecified: Secondary | ICD-10-CM | POA: Diagnosis not present

## 2023-03-06 DIAGNOSIS — Z9049 Acquired absence of other specified parts of digestive tract: Secondary | ICD-10-CM

## 2023-03-06 DIAGNOSIS — J439 Emphysema, unspecified: Secondary | ICD-10-CM | POA: Diagnosis not present

## 2023-03-06 DIAGNOSIS — M7989 Other specified soft tissue disorders: Secondary | ICD-10-CM | POA: Diagnosis not present

## 2023-03-06 DIAGNOSIS — K219 Gastro-esophageal reflux disease without esophagitis: Secondary | ICD-10-CM | POA: Diagnosis present

## 2023-03-06 DIAGNOSIS — E669 Obesity, unspecified: Secondary | ICD-10-CM | POA: Diagnosis present

## 2023-03-06 DIAGNOSIS — D508 Other iron deficiency anemias: Secondary | ICD-10-CM | POA: Diagnosis not present

## 2023-03-06 DIAGNOSIS — I998 Other disorder of circulatory system: Secondary | ICD-10-CM | POA: Diagnosis present

## 2023-03-06 DIAGNOSIS — J440 Chronic obstructive pulmonary disease with acute lower respiratory infection: Secondary | ICD-10-CM | POA: Diagnosis not present

## 2023-03-06 DIAGNOSIS — J9811 Atelectasis: Secondary | ICD-10-CM | POA: Diagnosis not present

## 2023-03-06 DIAGNOSIS — M25561 Pain in right knee: Secondary | ICD-10-CM | POA: Diagnosis present

## 2023-03-06 DIAGNOSIS — I743 Embolism and thrombosis of arteries of the lower extremities: Secondary | ICD-10-CM | POA: Diagnosis not present

## 2023-03-06 DIAGNOSIS — E039 Hypothyroidism, unspecified: Secondary | ICD-10-CM | POA: Diagnosis present

## 2023-03-06 DIAGNOSIS — W06XXXA Fall from bed, initial encounter: Secondary | ICD-10-CM | POA: Diagnosis present

## 2023-03-06 DIAGNOSIS — I70222 Atherosclerosis of native arteries of extremities with rest pain, left leg: Secondary | ICD-10-CM | POA: Diagnosis present

## 2023-03-06 DIAGNOSIS — R0902 Hypoxemia: Secondary | ICD-10-CM | POA: Diagnosis not present

## 2023-03-06 DIAGNOSIS — M130 Polyarthritis, unspecified: Secondary | ICD-10-CM

## 2023-03-06 DIAGNOSIS — Z888 Allergy status to other drugs, medicaments and biological substances status: Secondary | ICD-10-CM

## 2023-03-06 DIAGNOSIS — Z79899 Other long term (current) drug therapy: Secondary | ICD-10-CM

## 2023-03-06 DIAGNOSIS — R600 Localized edema: Secondary | ICD-10-CM | POA: Diagnosis not present

## 2023-03-06 DIAGNOSIS — Z89421 Acquired absence of other right toe(s): Secondary | ICD-10-CM

## 2023-03-06 DIAGNOSIS — Z7902 Long term (current) use of antithrombotics/antiplatelets: Secondary | ICD-10-CM

## 2023-03-06 DIAGNOSIS — Z9181 History of falling: Secondary | ICD-10-CM

## 2023-03-06 LAB — COMPREHENSIVE METABOLIC PANEL
ALT: 19 U/L (ref 0–44)
AST: 42 U/L — ABNORMAL HIGH (ref 15–41)
Albumin: 3.7 g/dL (ref 3.5–5.0)
Alkaline Phosphatase: 70 U/L (ref 38–126)
Anion gap: 11 (ref 5–15)
BUN: 12 mg/dL (ref 8–23)
CO2: 23 mmol/L (ref 22–32)
Calcium: 9.1 mg/dL (ref 8.9–10.3)
Chloride: 100 mmol/L (ref 98–111)
Creatinine, Ser: 0.71 mg/dL (ref 0.44–1.00)
GFR, Estimated: >60 mL/min (ref >60)
Glucose, Bld: 110 mg/dL — ABNORMAL HIGH (ref 70–99)
Potassium: 3.8 mmol/L (ref 3.5–5.1)
Sodium: 134 mmol/L — ABNORMAL LOW (ref 135–145)
Total Bilirubin: 0.7 mg/dL (ref <1.2)
Total Protein: 8 g/dL (ref 6.5–8.1)

## 2023-03-06 LAB — CBC WITH DIFFERENTIAL/PLATELET
Abs Immature Granulocytes: 0.05 10*3/uL (ref 0.00–0.07)
Basophils Absolute: 0 10*3/uL (ref 0.0–0.1)
Basophils Relative: 0 %
Eosinophils Absolute: 0 10*3/uL (ref 0.0–0.5)
Eosinophils Relative: 0 %
HCT: 37.8 % (ref 36.0–46.0)
Hemoglobin: 12.6 g/dL (ref 12.0–15.0)
Immature Granulocytes: 1 %
Lymphocytes Relative: 25 %
Lymphs Abs: 2.7 10*3/uL (ref 0.7–4.0)
MCH: 33.3 pg (ref 26.0–34.0)
MCHC: 33.3 g/dL (ref 30.0–36.0)
MCV: 100 fL (ref 80.0–100.0)
Monocytes Absolute: 1.1 10*3/uL — ABNORMAL HIGH (ref 0.1–1.0)
Monocytes Relative: 11 %
Neutro Abs: 6.8 10*3/uL (ref 1.7–7.7)
Neutrophils Relative %: 63 %
Platelets: 306 10*3/uL (ref 150–400)
RBC: 3.78 MIL/uL — ABNORMAL LOW (ref 3.87–5.11)
RDW: 13.4 % (ref 11.5–15.5)
WBC: 10.8 10*3/uL — ABNORMAL HIGH (ref 4.0–10.5)
nRBC: 0 % (ref 0.0–0.2)

## 2023-03-06 MED ORDER — ACETAMINOPHEN 650 MG RE SUPP: 650.0000 mg | Freq: Four times a day (QID) | RECTAL | Status: DC | PRN

## 2023-03-06 MED ORDER — SODIUM CHLORIDE 0.9% FLUSH
3.0000 mL | Freq: Two times a day (BID) | INTRAVENOUS | Status: DC
Start: 2023-03-06 — End: 2023-03-14
  Administered 2023-03-06 – 2023-03-14 (×13): 3 mL via INTRAVENOUS

## 2023-03-06 MED ORDER — HYDROMORPHONE HCL 1 MG/ML IJ SOLN
0.5000 mg | INTRAMUSCULAR | Status: DC | PRN
  Administered 2023-03-06 – 2023-03-07 (×2): 0.5 mg via INTRAVENOUS
  Filled 2023-03-06 (×2): qty 0.5

## 2023-03-06 MED ORDER — PANTOPRAZOLE SODIUM 40 MG PO TBEC
40.0000 mg | DELAYED_RELEASE_TABLET | Freq: Every morning | ORAL | Status: DC
  Administered 2023-03-07 – 2023-03-14 (×7): 40 mg via ORAL
  Filled 2023-03-06 (×7): qty 1

## 2023-03-06 MED ORDER — AMLODIPINE BESYLATE 5 MG PO TABS
5.0000 mg | ORAL_TABLET | Freq: Every day | ORAL | Status: DC
Start: 2023-03-06 — End: 2023-03-14
  Administered 2023-03-07 – 2023-03-14 (×6): 5 mg via ORAL
  Filled 2023-03-06 (×8): qty 1

## 2023-03-06 MED ORDER — POLYETHYLENE GLYCOL 3350 17 G PO PACK: 17.0000 g | PACK | Freq: Every day | ORAL | Status: DC | PRN

## 2023-03-06 MED ORDER — ACETAMINOPHEN 325 MG PO TABS
650.0000 mg | ORAL_TABLET | Freq: Four times a day (QID) | ORAL | Status: DC | PRN
  Administered 2023-03-08 – 2023-03-09 (×2): 650 mg via ORAL
  Filled 2023-03-06 (×2): qty 2

## 2023-03-06 MED ORDER — ONDANSETRON HCL 4 MG/2ML IJ SOLN
4.0000 mg | Freq: Four times a day (QID) | INTRAMUSCULAR | Status: DC | PRN
  Administered 2023-03-06 – 2023-03-07 (×2): 4 mg via INTRAVENOUS
  Filled 2023-03-06 (×2): qty 2

## 2023-03-06 MED ORDER — HEPARIN (PORCINE) 25000 UT/250ML-% IV SOLN
1200.0000 [IU]/h | INTRAVENOUS | Status: DC
  Administered 2023-03-06: 1200 [IU]/h via INTRAVENOUS
  Filled 2023-03-06 (×2): qty 250

## 2023-03-06 MED ORDER — ONDANSETRON HCL 4 MG PO TABS: 4.0000 mg | ORAL_TABLET | Freq: Four times a day (QID) | ORAL | Status: DC | PRN

## 2023-03-06 MED ORDER — OXYCODONE HCL 5 MG PO TABS
5.0000 mg | ORAL_TABLET | Freq: Four times a day (QID) | ORAL | Status: DC | PRN
  Administered 2023-03-06 – 2023-03-07 (×2): 5 mg via ORAL
  Filled 2023-03-06 (×2): qty 1

## 2023-03-06 MED ORDER — HEPARIN BOLUS VIA INFUSION
5000.0000 [IU] | Freq: Once | INTRAVENOUS | Status: AC
  Administered 2023-03-06: 5000 [IU] via INTRAVENOUS
  Filled 2023-03-06: qty 5000

## 2023-03-06 MED ORDER — IOHEXOL 350 MG/ML SOLN
100.0000 mL | Freq: Once | INTRAVENOUS | Status: AC | PRN
  Administered 2023-03-06: 100 mL via INTRAVENOUS

## 2023-03-06 MED ORDER — ROSUVASTATIN CALCIUM 20 MG PO TABS
20.0000 mg | ORAL_TABLET | Freq: Every day | ORAL | Status: DC
  Administered 2023-03-07 – 2023-03-13 (×7): 20 mg via ORAL
  Filled 2023-03-06 (×2): qty 1
  Filled 2023-03-06 (×2): qty 2
  Filled 2023-03-06 (×3): qty 1
  Filled 2023-03-06: qty 2

## 2023-03-06 MED ORDER — GABAPENTIN 300 MG PO CAPS
300.0000 mg | ORAL_CAPSULE | Freq: Two times a day (BID) | ORAL | Status: DC
  Administered 2023-03-06 – 2023-03-14 (×11): 300 mg via ORAL
  Filled 2023-03-06 (×12): qty 1

## 2023-03-06 MED ORDER — IPRATROPIUM-ALBUTEROL 0.5-2.5 (3) MG/3ML IN SOLN: 3.0000 mL | RESPIRATORY_TRACT | Status: DC | PRN

## 2023-03-06 NOTE — Assessment & Plan Note (Addendum)
Patient brought for 2 procedures by vascular team on 12/9 and 12/10.  Currently on aspirin, Plavix and Crestor.  Patient does have a blackish first toe, third toe and fifth toe with large area of discoloration on the bottom of the left foot and smaller areas on the left shin with some edema.  Vascular surgery saw patient and cleared to go home with close follow-up in a couple weeks.

## 2023-03-06 NOTE — Assessment & Plan Note (Addendum)
Continue Crestor 

## 2023-03-06 NOTE — Assessment & Plan Note (Addendum)
As needed nebulizers.

## 2023-03-06 NOTE — ED Notes (Signed)
Pt requesting Pain medication, RN notified.

## 2023-03-06 NOTE — ED Triage Notes (Addendum)
C/O pain to left foot x 1 week.  Reduced mobility due to pain to foot.  States urinary incontinence this morning was due to have to void prior to fall.

## 2023-03-06 NOTE — Assessment & Plan Note (Deleted)
History of polyarthritis, including the right hip, right knee, with acute exacerbation of the right knee due to fall earlier this week.  - PT/OT - Pain control as noted above

## 2023-03-06 NOTE — ED Notes (Signed)
Assisted patient into  bed. Pt had urinated on herself from not being able to get to the bathroom at home. Clothes removed, patient placed in gown, and patient wiped down. X-ray at bedside

## 2023-03-06 NOTE — Consult Note (Signed)
PHARMACY - ANTICOAGULATION CONSULT NOTE  Pharmacy Consult for Heparin Indication:  ischemic limb  Allergies  Allergen Reactions   Lisinopril-Hydrochlorothiazide Other (See Comments)    Patient Measurements: No weight or hight in epic > 1hr after consult placed. Data taken from most recent OV in care everywhere   Hight: 5'6" Weight: 102.5kg on 11/30/2022 IBW :59 kg Heparin Dosing Weight: 81 kg  Vital Signs: Temp: 99.5 F (37.5 C) (12/08 1216) Temp Source: Oral (12/08 1216) BP: 151/76 (12/08 1215) Pulse Rate: 86 (12/08 1218)  Labs: Recent Labs    03/06/23 0844  HGB 12.6  HCT 37.8  PLT 306  CREATININE 0.71   CrCl cannot be calculated (Unknown ideal weight.).  Medical History: Past Medical History:  Diagnosis Date   Arthritis    Bronchitis    COPD (chronic obstructive pulmonary disease) (HCC)    GERD (gastroesophageal reflux disease)    Gout    Hypertension     Medications:  No AC prior to admission per chart review  Assessment: 67yo female with PMH of PAD s/p angiography (most recent June 2024), hypertension, hyperlipidemia, bilateral carotid artery disease, frequent falls, B12 deficiency, hypothyroidism, gout, COPD. Admitted to ED with left foot pain and limb ischemia. Pharmacy consulted ti manage heparin infusion.  Goal of Therapy:  Heparin level 0.3-0.7 units/ml Monitor platelets by anticoagulation protocol: Yes   Plan:  Give 5000 units bolus x 1 Start heparin infusion at 1200 units/hr Check anti-Xa level in 6 hours and daily while on heparin Continue to monitor H&H and platelets  Willia Lampert Rodriguez-Guzman PharmD, BCPS 03/06/2023 3:02 PM

## 2023-03-06 NOTE — Assessment & Plan Note (Addendum)
Continue Norvasc

## 2023-03-06 NOTE — H&P (Signed)
History and Physical    Patient: Jackie Macias MVH:[id number] DOB: Feb 03, 1956 DOA: 03/06/2023 DOS: the patient was seen and examined on 03/06/2023 PCP: Miki Kins, FNP  Patient coming from: Home  Chief Complaint: Left foot pain  HPI: Jackie Macias is a 67 y.o. female with medical history significant of PAD s/p angiography (most recent June 2024), hypertension, hyperlipidemia, bilateral carotid artery disease, frequent falls, B12 deficiency, hypothyroidism, gout, COPD, who presents to the ED due to left foot pain  Mr. Wynkoop states that she had a fall earlier this week where she fell onto her right knee when she was bringing groceries due to tripping on a branch outside.  She did not hit her left leg at all, however approximately 4 days ago, she began to experience severe left lower leg and ankle pain with swelling.  She sleeps on a air mattress normally and today, she slid off the bed while asleep.  Due to severity of pain, she was unable to get up by herself and EMS was called.  ED course: On arrival to the ED, patient was hypertensive at 145/85 with heart rate 77.  She was saturating at 95% on room air.  She was afebrile 98.1. Initial workup notable for WBC of 10.8, hemoglobin of 12.6, sodium 134, glucose 110, GFR above 60.  Right knee x-ray with osteoarthritis and moderate-sized effusion.  Left foot x-ray with soft tissue swelling only.  Left DVT study negative.  Due to lack of pulses on the left, vascular surgery was consulted with recommendations to obtain a CT runoff and start heparin infusion.  TR[facility] contacted for admission.  Review of Systems: As mentioned in the history of present illness. All other systems reviewed and are negative.  Past Medical History:  Diagnosis Date   Arthritis    Bronchitis    COPD (chronic obstructive pulmonary disease) (HCC)    GERD (gastroesophageal reflux disease)    Gout    Hypertension    Past Surgical History:  Procedure Laterality Date    AMPUTATION Right 07/29/2017   Procedure: AMPUTATION DIGIT/ 84132;  Surgeon: Linus Galas, DPM;  Location: [facility];  Service: Podiatry;  Laterality: Right;   AMPUTATION TOE Right 02/18/2021   Procedure: AMPUTATION TOE-GREAT TOE;  Surgeon: Gwyneth Revels, DPM;  Location: [facility];  Service: Podiatry;  Laterality: Right;   CHOLECYSTECTOMY     facial biopsy Right    JOINT REPLACEMENT Right 12/2016   THR   LOWER EXTREMITY ANGIOGRAPHY Right 09/27/2017   Procedure: LOWER EXTREMITY ANGIOGRAPHY;  Surgeon: Renford Dills, MD;  Location: [facility];  Service: Cardiovascular;  Laterality: Right;   LOWER EXTREMITY ANGIOGRAPHY Right 06/17/2020   Procedure: LOWER EXTREMITY ANGIOGRAPHY;  Surgeon: Renford Dills, MD;  Location: [facility];  Service: Cardiovascular;  Laterality: Right;   LOWER EXTREMITY ANGIOGRAPHY Right 02/18/2021   Procedure: LOWER EXTREMITY ANGIOGRAPHY;  Surgeon: Renford Dills, MD;  Location: [facility];  Service: Cardiovascular;  Laterality: Right;   LOWER EXTREMITY ANGIOGRAPHY Left 09/07/2022   Procedure: Lower Extremity Angiography;  Surgeon: Renford Dills, MD;  Location: [facility];  Service: Cardiovascular;  Laterality: Left;   TONSILLECTOMY     TOTAL HIP ARTHROPLASTY Right 01/17/2017   Procedure: TOTAL HIP ARTHROPLASTY ANTERIOR APPROACH;  Surgeon: Lyndle Herrlich, MD;  Location: [facility];  Service: Orthopedics;  Laterality: Right;   Social History:  reports that she has been smoking cigarettes. She has a 50 pack-year smoking history. She  has never used smokeless tobacco. She reports current alcohol use of about 3.0 standard drinks of alcohol per week. She reports that she does not use drugs.  Allergies  Allergen Reactions   Lisinopril-Hydrochlorothiazide Other (See Comments)    Family History  Problem Relation Age of Onset   Breast cancer Sister 31   Breast cancer Other     Prior to Admission medications   Medication Sig  Start Date End Date Taking? Authorizing Provider  Acetaminophen 500 MG capsule Take 1-2 tablets by mouth every 6 (six) hours as needed. Patient not taking: Reported on 09/07/2022 04/11/18   [provider]  albuterol (VENTOLIN HFA) 108 (90 Base) MCG/ACT inhaler Inhale 2 puffs into the lungs every 6 (six) hours as needed for wheezing or shortness of breath. 11/22/22   Miki Kins, FNP  amLODipine (NORVASC) 5 MG tablet Take 1 tablet (5 mg total) by mouth daily. 11/22/22   Miki Kins, FNP  aspirin EC 81 MG tablet Take 1 tablet (81 mg total) by mouth daily. Patient not taking: Reported on 11/22/2022 06/17/20   Schnier, Latina Craver, MD  clopidogrel (PLAVIX) 75 MG tablet Take 1 tablet (75 mg total) by mouth daily. 11/22/22   Miki Kins, FNP  gabapentin (NEURONTIN) 300 MG capsule Take 1 capsule by mouth every morning and take 2 capsules by mouth at bedtime daily 11/22/22   Miki Kins, FNP  ipratropium-albuterol (DUONEB) 0.5-2.5 (3) MG/3ML SOLN Take 3 mLs by nebulization every 6 (six) hours as needed. 11/22/22   Miki Kins, FNP  pantoprazole (PROTONIX) 40 MG tablet Take 1 tablet (40 mg total) by mouth every morning. 11/22/22   Miki Kins, FNP  rosuvastatin (CRESTOR) 20 MG tablet Take 1 tablet (20 mg total) by mouth daily. 11/22/22   Miki Kins, FNP  traMADol (ULTRAM) 50 MG tablet Take 1 tablet (50 mg total) by mouth every 8 (eight) hours as needed. 11/22/22   Miki Kins, FNP  Vitamin D, Ergocalciferol, (DRISDOL) 1.25 MG (50000 UNIT) CAPS capsule Take 1 capsule (50,000 Units total) by mouth every 7 (seven) days. 11/23/22   Miki Kins, FNP    Physical Exam: Vitals:   03/06/23 0844 03/06/23 1215 03/06/23 1216 03/06/23 1218  BP:  (!) 151/76    Pulse: 77   86  Resp:    18  Temp:   99.5 F (37.5 C)   TempSrc:   Oral   SpO2: 95%   97%   Physical Exam Vitals and nursing note reviewed.  Constitutional:      General: She is not in acute distress.     Appearance: She is normal weight.  HENT:     Head: Normocephalic and atraumatic.     Mouth/Throat:     Mouth: Mucous membranes are moist.     Pharynx: Oropharynx is clear.  Cardiovascular:     Rate and Rhythm: Normal rate and regular rhythm.     Heart sounds: Murmur heard.  Pulmonary:     Effort: Pulmonary effort is normal. No respiratory distress.     Breath sounds: Normal breath sounds. No wheezing, rhonchi or rales.  Abdominal:     General: Bowel sounds are normal. There is no distension.     Palpations: Abdomen is soft.     Tenderness: There is no abdominal tenderness. There is no guarding.  Musculoskeletal:     Right knee: No swelling or deformity.     Left knee: No swelling or deformity.  Right lower leg: No edema.     Left lower leg: Tenderness (Significant tenderness of the lower portion of the calf and left ankle circumferentially with no evidence of ulceration or skin changes) present. Edema present.  Skin:    General: Skin is warm and dry.  Neurological:     Mental Status: She is alert and oriented to person, place, and time. Mental status is at baseline.  Psychiatric:        Mood and Affect: Mood normal.        Behavior: Behavior normal.    Data Reviewed: CBC with WBC of 10.8, hemoglobin of 12.6, and platelets of 306.  CMP with serum of 134, potassium 3.8, bicarb 23, glucose 110, BUN 12, creatinine 0.71, AST 42, ALT 19-60  US Venous Img Lower  Left (DVT Study)  Result Date: 03/06/2023 CLINICAL DATA:  Leg swelling for 5 days. EXAM: LEFT LOWER EXTREMITY VENOUS DOPPLER ULTRASOUND TECHNIQUE: Gray-scale sonography with compression, as well as color and duplex ultrasound, were performed to evaluate the deep venous system(s) from the level of the common femoral vein through the popliteal and proximal calf veins. COMPARISON:  None Available. FINDINGS: VENOUS Normal compressibility of the common femoral, superficial femoral, and popliteal veins, as well as the visualized  calf veins. Visualized portions of profunda femoral vein and great saphenous vein unremarkable. No filling defects to suggest DVT on grayscale or color Doppler imaging. Doppler waveforms show normal direction of venous flow, normal respiratory plasticity and response to augmentation. Limited views of the contralateral common femoral vein are unremarkable. OTHER A fluid collection in the left popliteal fossa measures 3.2 x 0.7 x 0.9 cm. Limitations: none IMPRESSION: 1. No evidence of deep venous thrombosis in the left lower extremity. 2. Fluid collection in the left popliteal fossa may represent a Baker's cyst. Electronically Signed   By: Romona Curls M.D.   On: 03/06/2023 11:25   DG Knee Complete 4 Views Right  Result Date: 03/06/2023 CLINICAL DATA:  Right knee pain. Palpable knot to medial towards lateral aspect. EXAM: RIGHT KNEE - COMPLETE 4+ VIEW COMPARISON:  None Available. FINDINGS: The mineralization and alignment are normal. There is no evidence of acute fracture or dislocation. There are age advanced tricompartmental degenerative changes with joint space narrowing and osteophytes. There is moderate size knee joint effusion without evidence of lipohemarthrosis. Popliteal stent noted with diffuse vascular calcifications. No apparent soft tissue mass. IMPRESSION: Age advanced tricompartmental degenerative changes with moderate size knee joint effusion. No acute osseous findings. Electronically Signed   By: Carey Bullocks M.D.   On: 03/06/2023 10:40   DG Foot Complete Left  Result Date: 03/06/2023 CLINICAL DATA:  67 year old female status post fall from bed.  Pain. EXAM: LEFT FOOT - COMPLETE 3+ VIEW COMPARISON:  None Available. FINDINGS: Bone mineralization is within normal limits for age. Mild pes planus. Calcaneus appears intact. Mostly normal joint spaces and alignment for age. There is mild degenerative spurring at the dorsal navicular. Small cuboid accessory ossicle. Chronic deformity of the 1st  distal phalanx. Distal metatarsal region soft tissue swelling. No acute fracture or dislocation identified. Dystrophic soft tissue calcification at the ankle. No evidence of ankle joint effusion. IMPRESSION: Distal metatarsal region soft tissue swelling. No acute fracture or dislocation identified in the left foot. Electronically Signed   By: Odessa Fleming M.D.   On: 03/06/2023 09:53    Results are pending, will review when available.  Assessment and Plan:  * Acute lower limb ischemia Patient presenting with  severe left foot pain and swelling in the absence of pulses concerning for acute limb ischemia.  Patient states she only takes Plavix at home and is compliant, no longer taking aspirin.  - Vascular surgery consulted; appreciate their recommendations - Heparin per pharmacy dosing - CTA runoff pending - N.p.o. after midnight - Pain control with Tylenol, oxycodone and Dilaudid  Chronic obstructive pulmonary disease (COPD) (HCC) No shortness of breath or wheezing reported at this time.  - DuoNebs as needed  Mixed hyperlipidemia - Continue home rosuvastatin  Polyarthritis, unspecified History of polyarthritis, including the right hip, right knee, with acute exacerbation of the right knee due to fall earlier this week.  - PT/OT - Pain control as noted above  Essential hypertension - Resume home amlodipine  Advance Care Planning:   Code Status: Full Code   Consults: Vascular surgery  Family Communication: No family at bedside  Severity of Illness: The appropriate patient status for this patient is OBSERVATION. Observation status is judged to be reasonable and necessary in order to provide the required intensity of service to ensure the patient's safety. The patient's presenting symptoms, physical exam findings, and initial radiographic and laboratory data in the context of their medical condition is felt to place them at decreased risk for further clinical deterioration. Furthermore, it  is anticipated that the patient will be medically stable for discharge from the hospital within 2 midnights of admission.   Author: Verdene Lennert, MD 03/06/2023 2:25 PM  For on call review [email or web address].

## 2023-03-06 NOTE — ED Triage Notes (Signed)
Pt in via EMS from home with c/o fall, sliding out of bed. Pt c/o pain in her right knee from a previous fall. Pt also c/o pain in left foot from gout although has never been dx'd with it. Pt has difficulty with mobility and has been using a scooter. Pt did urinate on herself after her fall.

## 2023-03-06 NOTE — ED Notes (Signed)
Pt refusing to go to CT at this time due to the fact that she is eating. CT to come back and get patient at a later time.

## 2023-03-06 NOTE — Progress Notes (Incomplete)
PHARMACY - ANTICOAGULATION CONSULT NOTE  Pharmacy Consult for *** Indication: {Indications:3041533}  Allergies  Allergen Reactions   Lisinopril-Hydrochlorothiazide Other (See Comments)    Patient Measurements:   Heparin Dosing Weight: ***  Vital Signs: Temp: 99.5 F (37.5 C) (12/08 1216) Temp Source: Oral (12/08 1216) BP: 151/76 (12/08 1215) Pulse Rate: 86 (12/08 1218)  Labs: Recent Labs    03/06/23 0844  HGB 12.6  HCT 37.8  PLT 306  CREATININE 0.71    CrCl cannot be calculated (Unknown ideal weight.).   Medical History: Past Medical History:  Diagnosis Date   Arthritis    Bronchitis    COPD (chronic obstructive pulmonary disease) (HCC)    GERD (gastroesophageal reflux disease)    Gout    Hypertension     Assessment: 67 y.o. female with PMH of HTN, GERD, COPD, peripheral vascular disease,  gout and arthritis presents for evaluation of left foot pain and right knee pain. A review of dispense history reveals no chronic anticoagulation prior to arrival  Goal of Therapy:  Heparin level 0.3-0.7 units/ml Monitor platelets by anticoagulation protocol: Yes   Plan:  {XBMW:4132440}  Lowella Bandy 03/06/2023,12:59 PM

## 2023-03-06 NOTE — ED Provider Notes (Signed)
[facility] Provider Note    Event Date/Time   First MD Initiated Contact with Patient 03/06/23 [phone]     (approximate)   History   No chief complaint on file.   HPI  Jackie Macias is a 67 y.o. female with PMH of HTN, GERD, COPD, peripheral vascular disease,  gout and arthritis presents for evaluation of left foot pain and right knee pain.  Patient states that she had a fall earlier this week where she landed on her right knee.  Patient did not have a fall this morning.  She states she was sleeping in an air mattress on the floor and woke up laying crooked on the bed, with her top half on top of the bed and bottom half on the floor.  She states that the air mattress was leaking air.  Patient borrowed a scooter from a friend and has been using that to get around the house since her fall on Tuesday when she injured her right knee.  She states that the pain in her left foot began on Wednesday or Thursday.      Physical Exam   Triage Vital Signs: ED Triage Vitals  Encounter Vitals Group     BP 03/06/23 0840 (!) 145/85     Systolic BP Percentile --      Diastolic BP Percentile --      Pulse Rate 03/06/23 0840 68     Resp 03/06/23 0840 18     Temp 03/06/23 0842 98.1 F (36.7 C)     Temp Source 03/06/23 0840 Oral     SpO2 03/06/23 0844 95 %     Weight --      Height --      Head Circumference --      Peak Flow --      Pain Score 03/06/23 0840 10     Pain Loc --      Pain Education --      Exclude from Growth Chart --     Most recent vital signs: Vitals:   03/06/23 1216 03/06/23 1218  BP:    Pulse:  86  Resp:  18  Temp: 99.5 F (37.5 C)   SpO2:  97%    General: Awake, no distress.  CV:  Good peripheral perfusion.  RRR. Resp:  Normal effort. CTAB. Abd:  No distention.  Other:  Left lower extremity visibly swollen when compared to the right.  Unable to palpate a dorsalis pedis or posterior tibialis pulse but capillary refill in the toes is  appropriate.  Both feet are cool to touch.  Tenderness to palpation through the left midfoot and across the left ankle.  Left great toe is dark with evidence of ingrown toenail but no acute infection.  Left calf is tender to palpation and compression.   ED Results / Procedures / Treatments   Labs (all labs ordered are listed, but only abnormal results are displayed) Labs Reviewed  CBC WITH DIFFERENTIAL/PLATELET - Abnormal; Notable for the following components:      Result Value   WBC 10.8 (*)    RBC 3.78 (*)    Monocytes Absolute 1.1 (*)    All other components within normal limits  COMPREHENSIVE METABOLIC PANEL - Abnormal; Notable for the following components:   Sodium 134 (*)    Glucose, Bld 110 (*)    AST 42 (*)    All other components within normal limits    RADIOLOGY  X-rays of the  left foot and right knee, as well as venous ultrasound of the left leg obtained.  I interpreted the images as well as reviewed the radiologist report.  Left foot x-ray shows soft tissue swelling across the metatarsals but no fractures or dislocations.  Right knee x-ray shows advanced tricompartmental degenerative changes with a moderate-sized knee joint effusion.  Ultrasound was negative for DVT but did show a fluid collection in the popliteal fossa, consistent with a Baker's cyst.  PROCEDURES:  Critical Care performed: No  Procedures   MEDICATIONS ORDERED IN ED: Medications - No data to display   IMPRESSION / MDM / ASSESSMENT AND PLAN / ED COURSE  I reviewed the triage vital signs and the nursing notes.                             67 year old female presents for evaluation of right knee and left foot pain.  Patient was hypertensive in triage but does have history of hypertension.  Vital signs stable otherwise.  Patient NAD on exam.  Differential diagnosis includes, but is not limited to, fracture, dislocation, contusion, arthritis, gout, DVT.  Patient's presentation is most  consistent with acute complicated illness / injury requiring diagnostic workup.  Left foot x-ray obtained and showed soft tissue swelling but was negative for fracture.  Right knee x-ray showed tricompartmental arthritis with a moderately sized joint effusion.  Patient does have clear unilateral left lower leg swelling as well as calf tenderness so I got an ultrasound to rule out a DVT.  Ultrasound was negative for DVT but did show fluid collection in the popliteal fossa consistent with a Baker's cyst.  Patient has significant vascular history with stent placement, she reports she is followed by Dr. Gilda Crease.  Patient had multiple stents and angioplasty to the left lower extremity in June.  Today she has increasing pain at rest and is unable to bear weight on the left leg.  I was unable to palpate a pulse and could not find 1 with a Doppler.  I spoke with the on-call vascular doctor who recommended CTA and admission for further workup.  CTA pending and order to hospitalist placed.  FINAL CLINICAL IMPRESSION(S) / ED DIAGNOSES   Final diagnoses:  Left foot pain     Rx / DC Orders   ED Discharge Orders     None        Note:  This document was prepared using Dragon voice recognition software and may include unintentional dictation errors.   Cameron Ali, PA-C 03/06/23 1304    Pilar Jarvis, MD 03/06/23 1320

## 2023-03-07 ENCOUNTER — Encounter: Payer: Self-pay | Admitting: Hospitalist

## 2023-03-07 ENCOUNTER — Encounter
Admission: EM | Disposition: A | Discharge status: Home Health | Payer: Self-pay | Source: Home / Self Care | Attending: Internal Medicine

## 2023-03-07 DIAGNOSIS — Z9889 Other specified postprocedural states: Secondary | ICD-10-CM

## 2023-03-07 DIAGNOSIS — E871 Hypo-osmolality and hyponatremia: Secondary | ICD-10-CM | POA: Diagnosis present

## 2023-03-07 DIAGNOSIS — I779 Disorder of arteries and arterioles, unspecified: Secondary | ICD-10-CM | POA: Diagnosis present

## 2023-03-07 DIAGNOSIS — M25561 Pain in right knee: Secondary | ICD-10-CM | POA: Diagnosis present

## 2023-03-07 DIAGNOSIS — J181 Lobar pneumonia, unspecified organism: Secondary | ICD-10-CM | POA: Diagnosis not present

## 2023-03-07 DIAGNOSIS — M1711 Unilateral primary osteoarthritis, right knee: Secondary | ICD-10-CM | POA: Diagnosis present

## 2023-03-07 DIAGNOSIS — T82898A Other specified complication of vascular prosthetic devices, implants and grafts, initial encounter: Secondary | ICD-10-CM

## 2023-03-07 DIAGNOSIS — I743 Embolism and thrombosis of arteries of the lower extremities: Secondary | ICD-10-CM | POA: Diagnosis not present

## 2023-03-07 DIAGNOSIS — W010XXA Fall on same level from slipping, tripping and stumbling without subsequent striking against object, initial encounter: Secondary | ICD-10-CM | POA: Diagnosis present

## 2023-03-07 DIAGNOSIS — R918 Other nonspecific abnormal finding of lung field: Secondary | ICD-10-CM | POA: Diagnosis not present

## 2023-03-07 DIAGNOSIS — R296 Repeated falls: Secondary | ICD-10-CM | POA: Diagnosis present

## 2023-03-07 DIAGNOSIS — T82856A Stenosis of peripheral vascular stent, initial encounter: Secondary | ICD-10-CM | POA: Diagnosis not present

## 2023-03-07 DIAGNOSIS — Y831 Surgical operation with implant of artificial internal device as the cause of abnormal reaction of the patient, or of later complication, without mention of misadventure at the time of the procedure: Secondary | ICD-10-CM | POA: Diagnosis present

## 2023-03-07 DIAGNOSIS — F17210 Nicotine dependence, cigarettes, uncomplicated: Secondary | ICD-10-CM | POA: Diagnosis present

## 2023-03-07 DIAGNOSIS — E782 Mixed hyperlipidemia: Secondary | ICD-10-CM | POA: Diagnosis present

## 2023-03-07 DIAGNOSIS — E669 Obesity, unspecified: Secondary | ICD-10-CM | POA: Diagnosis present

## 2023-03-07 DIAGNOSIS — E039 Hypothyroidism, unspecified: Secondary | ICD-10-CM | POA: Diagnosis present

## 2023-03-07 DIAGNOSIS — I70222 Atherosclerosis of native arteries of extremities with rest pain, left leg: Secondary | ICD-10-CM | POA: Diagnosis present

## 2023-03-07 DIAGNOSIS — J440 Chronic obstructive pulmonary disease with acute lower respiratory infection: Secondary | ICD-10-CM | POA: Diagnosis not present

## 2023-03-07 DIAGNOSIS — E876 Hypokalemia: Secondary | ICD-10-CM | POA: Diagnosis present

## 2023-03-07 DIAGNOSIS — I82412 Acute embolism and thrombosis of left femoral vein: Secondary | ICD-10-CM | POA: Diagnosis present

## 2023-03-07 DIAGNOSIS — R509 Fever, unspecified: Secondary | ICD-10-CM | POA: Diagnosis not present

## 2023-03-07 DIAGNOSIS — K219 Gastro-esophageal reflux disease without esophagitis: Secondary | ICD-10-CM | POA: Diagnosis present

## 2023-03-07 DIAGNOSIS — I998 Other disorder of circulatory system: Secondary | ICD-10-CM | POA: Diagnosis not present

## 2023-03-07 DIAGNOSIS — R600 Localized edema: Secondary | ICD-10-CM | POA: Diagnosis not present

## 2023-03-07 DIAGNOSIS — I10 Essential (primary) hypertension: Secondary | ICD-10-CM | POA: Diagnosis present

## 2023-03-07 DIAGNOSIS — M79672 Pain in left foot: Principal | ICD-10-CM

## 2023-03-07 DIAGNOSIS — J9811 Atelectasis: Secondary | ICD-10-CM | POA: Diagnosis not present

## 2023-03-07 DIAGNOSIS — T82868A Thrombosis of vascular prosthetic devices, implants and grafts, initial encounter: Principal | ICD-10-CM

## 2023-03-07 DIAGNOSIS — I82432 Acute embolism and thrombosis of left popliteal vein: Secondary | ICD-10-CM | POA: Diagnosis present

## 2023-03-07 DIAGNOSIS — Z6836 Body mass index (BMI) 36.0-36.9, adult: Secondary | ICD-10-CM | POA: Diagnosis not present

## 2023-03-07 DIAGNOSIS — L89619 Pressure ulcer of right heel, unspecified stage: Secondary | ICD-10-CM | POA: Diagnosis present

## 2023-03-07 DIAGNOSIS — W06XXXA Fall from bed, initial encounter: Secondary | ICD-10-CM | POA: Diagnosis present

## 2023-03-07 DIAGNOSIS — D509 Iron deficiency anemia, unspecified: Secondary | ICD-10-CM | POA: Diagnosis present

## 2023-03-07 DIAGNOSIS — M1611 Unilateral primary osteoarthritis, right hip: Secondary | ICD-10-CM | POA: Diagnosis present

## 2023-03-07 HISTORY — PX: LOWER EXTREMITY ANGIOGRAPHY: CATH118251

## 2023-03-07 LAB — HEPARIN LEVEL (UNFRACTIONATED)
Heparin Unfractionated: 0.35 [IU]/mL (ref 0.30–0.70)
Heparin Unfractionated: 0.34 [IU]/mL (ref 0.30–0.70)
Heparin Unfractionated: 0.77 [IU]/mL — ABNORMAL HIGH (ref 0.30–0.70)
Heparin Unfractionated: 0.17 [IU]/mL — ABNORMAL LOW (ref 0.30–0.70)

## 2023-03-07 LAB — CBC
HCT: 33.2 % — ABNORMAL LOW (ref 36.0–46.0)
HCT: 32.3 % — ABNORMAL LOW (ref 36.0–46.0)
HCT: 32.2 % — ABNORMAL LOW (ref 36.0–46.0)
Hemoglobin: 11.5 g/dL — ABNORMAL LOW (ref 12.0–15.0)
Hemoglobin: 11 g/dL — ABNORMAL LOW (ref 12.0–15.0)
Hemoglobin: 11 g/dL — ABNORMAL LOW (ref 12.0–15.0)
MCH: 33.4 pg (ref 26.0–34.0)
MCH: 33 pg (ref 26.0–34.0)
MCH: 33.4 pg (ref 26.0–34.0)
MCHC: 34.6 g/dL (ref 30.0–36.0)
MCHC: 34.1 g/dL (ref 30.0–36.0)
MCHC: 34.2 g/dL (ref 30.0–36.0)
MCV: 96.5 fL (ref 80.0–100.0)
MCV: 97 fL (ref 80.0–100.0)
MCV: 97.9 fL (ref 80.0–100.0)
Platelets: 257 10*3/uL (ref 150–400)
Platelets: 238 10*3/uL (ref 150–400)
Platelets: 214 10*3/uL (ref 150–400)
RBC: 3.44 MIL/uL — ABNORMAL LOW (ref 3.87–5.11)
RBC: 3.33 MIL/uL — ABNORMAL LOW (ref 3.87–5.11)
RBC: 3.29 MIL/uL — ABNORMAL LOW (ref 3.87–5.11)
RDW: 13.3 % (ref 11.5–15.5)
RDW: 13.4 % (ref 11.5–15.5)
RDW: 13.6 % (ref 11.5–15.5)
WBC: 8.7 10*3/uL (ref 4.0–10.5)
WBC: 10.4 10*3/uL (ref 4.0–10.5)
WBC: 10.1 10*3/uL (ref 4.0–10.5)
nRBC: 0 % (ref 0.0–0.2)
nRBC: 0 % (ref 0.0–0.2)
nRBC: 0 % (ref 0.0–0.2)

## 2023-03-07 LAB — BASIC METABOLIC PANEL
Anion gap: 11 (ref 5–15)
BUN: 13 mg/dL (ref 8–23)
CO2: 21 mmol/L — ABNORMAL LOW (ref 22–32)
Calcium: 8.9 mg/dL (ref 8.9–10.3)
Chloride: 103 mmol/L (ref 98–111)
Creatinine, Ser: 0.67 mg/dL (ref 0.44–1.00)
GFR, Estimated: >60 mL/min (ref >60)
Glucose, Bld: 123 mg/dL — ABNORMAL HIGH (ref 70–99)
Potassium: 3.5 mmol/L (ref 3.5–5.1)
Sodium: 135 mmol/L (ref 135–145)

## 2023-03-07 LAB — FIBRINOGEN
Fibrinogen: 707 mg/dL — ABNORMAL HIGH (ref 210–475)
Fibrinogen: 615 mg/dL — ABNORMAL HIGH (ref 210–475)

## 2023-03-07 LAB — MRSA NEXT GEN BY PCR, NASAL: MRSA by PCR Next Gen: NOT DETECTED

## 2023-03-07 SURGERY — LOWER EXTREMITY ANGIOGRAPHY
Anesthesia: Moderate Sedation | Laterality: Left

## 2023-03-07 MED ORDER — ALTEPLASE 2 MG IJ SOLR
INTRAMUSCULAR | Status: AC
  Filled 2023-03-07: qty 6

## 2023-03-07 MED ORDER — HEPARIN (PORCINE) IN NACL 2000-0.9 UNIT/L-% IV SOLN
INTRAVENOUS | Status: DC | PRN
  Administered 2023-03-07: 1000 mL

## 2023-03-07 MED ORDER — HEPARIN (PORCINE) 25000 UT/250ML-% IV SOLN
INTRAVENOUS | Status: AC
  Filled 2023-03-07: qty 250

## 2023-03-07 MED ORDER — SODIUM CHLORIDE 0.9% FLUSH: 3.0000 mL | INTRAVENOUS | Status: DC | PRN

## 2023-03-07 MED ORDER — DIPHENHYDRAMINE HCL 50 MG/ML IJ SOLN: 50.0000 mg | Freq: Once | INTRAMUSCULAR | Status: DC | PRN

## 2023-03-07 MED ORDER — LIDOCAINE-EPINEPHRINE (PF) 1 %-1:200000 IJ SOLN
INTRAMUSCULAR | Status: DC | PRN
  Administered 2023-03-07: 10 mL

## 2023-03-07 MED ORDER — SODIUM CHLORIDE 0.9 % IV SOLN
1.0000 mg/h | INTRAVENOUS | Status: AC
  Administered 2023-03-07 (×2): 1 mg/h
  Filled 2023-03-07: qty 10

## 2023-03-07 MED ORDER — HEPARIN SODIUM (PORCINE) 1000 UNIT/ML IJ SOLN
INTRAMUSCULAR | Status: AC
Start: 2023-03-07 — End: ?
  Filled 2023-03-07: qty 10

## 2023-03-07 MED ORDER — ALTEPLASE 1 MG/ML SYRINGE FOR VASCULAR PROCEDURE
INTRAMUSCULAR | Status: DC | PRN
  Administered 2023-03-07: 8 mg via INTRA_ARTERIAL

## 2023-03-07 MED ORDER — SODIUM CHLORIDE 0.9 % IV SOLN: INTRAVENOUS | Status: DC

## 2023-03-07 MED ORDER — FENTANYL CITRATE (PF) 100 MCG/2ML IJ SOLN
INTRAMUSCULAR | Status: AC
  Filled 2023-03-07: qty 2

## 2023-03-07 MED ORDER — FAMOTIDINE 20 MG PO TABS: 40.0000 mg | ORAL_TABLET | Freq: Once | ORAL | Status: DC | PRN

## 2023-03-07 MED ORDER — HEPARIN SODIUM (PORCINE) 1000 UNIT/ML IJ SOLN
INTRAMUSCULAR | Status: DC | PRN
  Administered 2023-03-07: 5000 [IU] via INTRAVENOUS

## 2023-03-07 MED ORDER — HYDRALAZINE HCL 20 MG/ML IJ SOLN
INTRAMUSCULAR | Status: DC | PRN
  Administered 2023-03-07: 10 mg via INTRAVENOUS

## 2023-03-07 MED ORDER — IODIXANOL 320 MG/ML IV SOLN
INTRAVENOUS | Status: DC | PRN
  Administered 2023-03-07: 40 mL via INTRA_ARTERIAL

## 2023-03-07 MED ORDER — CEFAZOLIN SODIUM-DEXTROSE 2-4 GM/100ML-% IV SOLN
2.0000 g | INTRAVENOUS | Status: AC
  Administered 2023-03-07: 2 g via INTRAVENOUS

## 2023-03-07 MED ORDER — CHLORHEXIDINE GLUCONATE CLOTH 2 % EX PADS
6.0000 | MEDICATED_PAD | Freq: Every day | CUTANEOUS | Status: DC
  Administered 2023-03-07: 6 via TOPICAL

## 2023-03-07 MED ORDER — HYDRALAZINE HCL 20 MG/ML IJ SOLN
10.0000 mg | Freq: Four times a day (QID) | INTRAMUSCULAR | Status: DC | PRN
  Administered 2023-03-08: 10 mg via INTRAVENOUS
  Filled 2023-03-07: qty 1

## 2023-03-07 MED ORDER — SODIUM CHLORIDE 0.9 % IV SOLN: 250.0000 mL | INTRAVENOUS | Status: AC | PRN

## 2023-03-07 MED ORDER — MIDAZOLAM HCL 5 MG/5ML IJ SOLN
INTRAMUSCULAR | Status: AC
Start: 2023-03-07 — End: ?
  Filled 2023-03-07: qty 5

## 2023-03-07 MED ORDER — SODIUM CHLORIDE 0.9% FLUSH
3.0000 mL | Freq: Two times a day (BID) | INTRAVENOUS | Status: DC
  Administered 2023-03-07 – 2023-03-10 (×5): 3 mL via INTRAVENOUS

## 2023-03-07 MED ORDER — HEPARIN (PORCINE) 25000 UT/250ML-% IV SOLN
600.0000 [IU]/h | INTRAVENOUS | Status: DC
  Administered 2023-03-07: 600 [IU]/h via INTRA_ARTERIAL

## 2023-03-07 MED ORDER — HYDRALAZINE HCL 20 MG/ML IJ SOLN
INTRAMUSCULAR | Status: AC
  Filled 2023-03-07: qty 1

## 2023-03-07 MED ORDER — MIDAZOLAM HCL 2 MG/ML PO SYRP: 8.0000 mg | ORAL_SOLUTION | Freq: Once | ORAL | Status: DC | PRN

## 2023-03-07 MED ORDER — FENTANYL CITRATE (PF) 100 MCG/2ML IJ SOLN
INTRAMUSCULAR | Status: DC | PRN
  Administered 2023-03-07 (×2): 25 ug via INTRAVENOUS

## 2023-03-07 MED ORDER — METHYLPREDNISOLONE SODIUM SUCC 125 MG IJ SOLR: 125.0000 mg | Freq: Once | INTRAMUSCULAR | Status: DC | PRN

## 2023-03-07 MED ORDER — SODIUM CHLORIDE 0.9 % IV SOLN
0.5000 mg/h | INTRAVENOUS | Status: DC
  Filled 2023-03-07 (×2): qty 10

## 2023-03-07 MED ORDER — OXYCODONE HCL 5 MG PO TABS
5.0000 mg | ORAL_TABLET | ORAL | Status: DC | PRN
  Administered 2023-03-07 – 2023-03-08 (×3): 10 mg via ORAL
  Administered 2023-03-09 – 2023-03-14 (×8): 5 mg via ORAL
  Filled 2023-03-07 (×2): qty 2
  Filled 2023-03-07: qty 1
  Filled 2023-03-07: qty 2
  Filled 2023-03-07 (×7): qty 1

## 2023-03-07 MED ORDER — CEFAZOLIN SODIUM-DEXTROSE 2-4 GM/100ML-% IV SOLN
INTRAVENOUS | Status: AC
  Filled 2023-03-07: qty 100

## 2023-03-07 MED ORDER — MORPHINE SULFATE (PF) 2 MG/ML IV SOLN
1.0000 mg | INTRAVENOUS | Status: DC | PRN
  Administered 2023-03-07: 1 mg via INTRAVENOUS
  Filled 2023-03-07: qty 1

## 2023-03-07 MED ORDER — MIDAZOLAM HCL 2 MG/2ML IJ SOLN
INTRAMUSCULAR | Status: DC | PRN
  Administered 2023-03-07 (×2): 1 mg via INTRAVENOUS

## 2023-03-07 MED ORDER — OXYCODONE HCL 5 MG PO TABS: 5.0000 mg | ORAL_TABLET | Freq: Four times a day (QID) | ORAL | Status: DC | PRN

## 2023-03-07 SURGICAL SUPPLY — 19 items
BALLN ULTRVRSE 2.5X220X150 (BALLOONS) ×1
BALLOON ULTRVRSE 2.5X220X150 (BALLOONS) IMPLANT
CATH ANGIO 5F PIGTAIL 65CM (CATHETERS) IMPLANT
CATH BEACON 5 .038 100 VERT TP (CATHETERS) IMPLANT
CATH INFUS 135X30 (CATHETERS) IMPLANT
CATH ROTAREX 135 6FR (CATHETERS) IMPLANT
CATH SEEKER .035X135CM (CATHETERS) IMPLANT
CATH TEMPO 5F RIM 65CM (CATHETERS) IMPLANT
COVER PROBE ULTRASOUND 5X96 (MISCELLANEOUS) IMPLANT
DEVICE PRESTO INFLATION (MISCELLANEOUS) IMPLANT
GLIDEWIRE ADV .035X260CM (WIRE) IMPLANT
KIT CV MULTILUMEN 7FR 20 (SET/KITS/TRAYS/PACK) ×1
KIT CV MULTILUMEN 7FR 20 SUB (SET/KITS/TRAYS/PACK) IMPLANT
PACK ANGIOGRAPHY (CUSTOM PROCEDURE TRAY) ×1 IMPLANT
SHEATH BRITE TIP 5FRX11 (SHEATH) IMPLANT
SHEATH PINNACLE ST 6F 45CM (SHEATH) IMPLANT
TUBING CONTRAST HIGH PRESS 72 (TUBING) IMPLANT
WIRE G V18X300CM (WIRE) IMPLANT
WIRE GUIDERIGHT .035X150 (WIRE) IMPLANT

## 2023-03-07 NOTE — Interval H&P Note (Signed)
History and Physical Interval Note:  03/07/2023 11:59 AM  Jackie Macias  has presented today for surgery, with the diagnosis of PAD.  The various methods of treatment have been discussed with the patient and family. After consideration of risks, benefits and other options for treatment, the patient has consented to  Procedure(s): Lower Extremity Angiography (Left) as a surgical intervention.  The patient's history has been reviewed, patient examined, no change in status, stable for surgery.  I have reviewed the patient's chart and labs.  Questions were answered to the patient's satisfaction.     Festus Barren

## 2023-03-07 NOTE — Progress Notes (Signed)
  PROGRESS NOTE    Jackie Macias  ATF:[id number] DOB: Apr 29, 1955 DOA: 03/06/2023 PCP: Miki Kins, FNP  IC11A/IC11A-AA  LOS: 0 days   Brief hospital course:   Assessment & Plan: Jackie Macias is a 67 y.o. female with medical history significant of PAD s/p angiography (most recent June 2024), hypertension, hyperlipidemia, bilateral carotid artery disease, frequent falls, B12 deficiency, hypothyroidism, gout, COPD, who presents to the ED due to left foot pain    * Acute lower limb ischemia, left Patient presenting with severe left foot pain and swelling in the absence of pulses concerning for acute limb ischemia.  Patient states she only takes Plavix at home and is compliant, no longer taking aspirin. --CT angio with findings of stent occlusion and no significant arterial supply to the left foot below the level of the ankle mortise.   Started on heparin gtt. Plan: --angiogram and intervention today --transfer to ICU for alteplase and heparin gtt  Chronic obstructive pulmonary disease (COPD) (HCC) No shortness of breath or wheezing reported at this time.  Mixed hyperlipidemia --cont home statin  Hx of Polyarthritis, unspecified History of polyarthritis, including the right hip, right knee, with acute exacerbation of the right knee due to fall earlier this week. --PT/OT  Essential hypertension --cont home amlodipine --IV hydralazine PRN   DVT prophylaxis: YH:[id number] gtt Code Status: Full code  Family Communication:  Level of care: ICU Dispo:   The patient is from: home Anticipated d/c is to: to be determined Anticipated d/c date is: to be determined   Subjective and Interval History:  Pt underwent angiogram and intervention today, and was transferred to ICU afterwards.  Pt reported pain in her LE.   Objective: Vitals:   03/07/23 1558 03/07/23 1600 03/07/23 1653 03/07/23 1705  BP: (!) 159/83 (!) 159/83  (!) 164/86  Pulse: 95 94  93  Resp: 18 (!) 24  17   Temp:      TempSrc:      SpO2: 95% 95%  94%  Weight:   80.6 kg   Height:        Intake/Output Summary (Last 24 hours) at 03/07/2023 1805 Last data filed at 03/07/2023 1600 Gross per 24 hour  Intake 677.56 ml  Output --  Net 677.56 ml   Filed Weights   03/06/23 1608 03/07/23 1653  Weight: 77.1 kg 80.6 kg    Examination:   Constitutional: NAD, AAOx3 HEENT: conjunctivae and lids normal, EOMI CV: No cyanosis.   RESP: normal respiratory effort, on RA Extremities: left foot cool to touch SKIN: warm, dry Neuro: II - XII grossly intact.   Psych: Normal mood and affect.  Appropriate judgement and reason   Data Reviewed: I have personally reviewed labs and imaging studies  Time spent: 50 minutes  Darlin Priestly, MD [facility] If 7PM-7AM, please contact night-coverage 03/07/2023, 6:05 PM

## 2023-03-07 NOTE — Consult Note (Signed)
PHARMACY - ANTICOAGULATION CONSULT NOTE  Pharmacy Consult for Heparin Indication:  ischemic limb  Allergies  Allergen Reactions   Lisinopril-Hydrochlorothiazide Other (See Comments)    Patient Measurements: No weight or hight in epic > 1hr after consult placed. Data taken from most recent OV in care everywhere   Hight: 5'6" Weight: 102.5kg on 11/30/2022 IBW :59 kg Heparin Dosing Weight: 81 kg  Vital Signs: Temp: 98.3 F (36.8 C) (12/09 0300) Temp Source: Oral (12/09 0300) BP: 164/72 (12/09 0300) Pulse Rate: 89 (12/09 0300)  Labs: Recent Labs    03/06/23 0844 03/07/23 0047  HGB 12.6  --   HCT 37.8  --   PLT 306  --   HEPARINUNFRC  --  0.35  CREATININE 0.71  --    Estimated Creatinine Clearance: 69.1 mL/min (by C-G formula based on SCr of 0.71 mg/dL).  Medical History: Past Medical History:  Diagnosis Date   Arthritis    Bronchitis    COPD (chronic obstructive pulmonary disease) (HCC)    GERD (gastroesophageal reflux disease)    Gout    Hypertension     Medications:  No AC prior to admission per chart review  Assessment: 67yo female with PMH of PAD s/p angiography (most recent June 2024), hypertension, hyperlipidemia, bilateral carotid artery disease, frequent falls, B12 deficiency, hypothyroidism, gout, COPD. Admitted to ED with left foot pain and limb ischemia. Pharmacy consulted ti manage heparin infusion.  Goal of Therapy:  Heparin level 0.3-0.7 units/ml Monitor platelets by anticoagulation protocol: Yes   Plan:  12/9:  HL @ 0047 = 0.35, therapeutic X 1 - Will continue this pt on current rate and recheck HL in 6 hrs.  Dulse Rutan D 03/07/2023 3:38 AM

## 2023-03-07 NOTE — Progress Notes (Signed)
OT Cancellation Note  Patient Details Name: Jackie Macias MRN: [id number] DOB: 10-03-1955   Cancelled Treatment:    Reason Eval/Treat Not Completed: Other (comment). Consult received, chart reviewed. Pt pending vascular consult note and now NPO with order for a peripheral cardiac catheterization. Will hold OT evaluation at this time and re-attempt at later date/time pending procedure to further inform plan of care.   Arman Filter., MPH, MS, OTR/L ascom [phone] 03/07/23, 8:53 AM

## 2023-03-07 NOTE — Progress Notes (Signed)
Dr. Wyn Quaker awoke pt. Post procedure to discuss procedural results. Pt. Nodded understanding of conversation. STAT labs sent and IV gtts. Started per orders.

## 2023-03-07 NOTE — Plan of Care (Signed)
  Problem: Pain Management: Goal: General experience of comfort will improve Outcome: Not Progressing    Patient states she has a lot of leg discomfort at this time

## 2023-03-07 NOTE — Consult Note (Signed)
Hospital Consult    Reason for Consult:  Left Foot Pain  Requesting Physician:  Dr Imagene Gurney MD  MRN #:  [id number]  History of Present Illness: This is a 67 y.o. female with medical history significant of PAD s/p angiography (most recent June 2024), hypertension, hyperlipidemia, bilateral carotid artery disease, frequent falls, B12 deficiency, hypothyroidism, gout, COPD, who presents to the ED due to left foot pain   Mr. Adamek states that she had a fall earlier this week where she fell onto her right knee when she was bringing groceries due to tripping on a branch outside.  She did not hit her left leg at all, however approximately 4 days ago, she began to experience severe left lower leg and ankle pain with swelling.  She sleeps on a air mattress normally and today, she slid off the bed while asleep.  Due to severity of pain, she was unable to get up by herself and EMS was called.   Upon work up patient underwent CTA AO-BIFEM and is noted to have prior stent placement to left SFA and Popliteal arteries. Vascular Surgery consulted to evaluate.  Past Medical History:  Diagnosis Date   Arthritis    Bronchitis    COPD (chronic obstructive pulmonary disease) (HCC)    GERD (gastroesophageal reflux disease)    Gout    Hypertension     Past Surgical History:  Procedure Laterality Date   AMPUTATION Right 07/29/2017   Procedure: AMPUTATION DIGIT/ [id number];  Surgeon: Linus Galas, DPM;  Location: [facility];  Service: Podiatry;  Laterality: Right;   AMPUTATION TOE Right 02/18/2021   Procedure: AMPUTATION TOE-GREAT TOE;  Surgeon: Gwyneth Revels, DPM;  Location: [facility];  Service: Podiatry;  Laterality: Right;   CHOLECYSTECTOMY     facial biopsy Right    JOINT REPLACEMENT Right 12/2016   THR   LOWER EXTREMITY ANGIOGRAPHY Right 09/27/2017   Procedure: LOWER EXTREMITY ANGIOGRAPHY;  Surgeon: Renford Dills, MD;  Location: [facility];  Service: Cardiovascular;  Laterality: Right;    LOWER EXTREMITY ANGIOGRAPHY Right 06/17/2020   Procedure: LOWER EXTREMITY ANGIOGRAPHY;  Surgeon: Renford Dills, MD;  Location: [facility];  Service: Cardiovascular;  Laterality: Right;   LOWER EXTREMITY ANGIOGRAPHY Right 02/18/2021   Procedure: LOWER EXTREMITY ANGIOGRAPHY;  Surgeon: Renford Dills, MD;  Location: [facility];  Service: Cardiovascular;  Laterality: Right;   LOWER EXTREMITY ANGIOGRAPHY Left 09/07/2022   Procedure: Lower Extremity Angiography;  Surgeon: Renford Dills, MD;  Location: [facility];  Service: Cardiovascular;  Laterality: Left;   TONSILLECTOMY     TOTAL HIP ARTHROPLASTY Right 01/17/2017   Procedure: TOTAL HIP ARTHROPLASTY ANTERIOR APPROACH;  Surgeon: Lyndle Herrlich, MD;  Location: [facility];  Service: Orthopedics;  Laterality: Right;    Allergies  Allergen Reactions   Lisinopril-Hydrochlorothiazide Other (See Comments)    Prior to Admission medications   Medication Sig Start Date End Date Taking? Authorizing Provider  albuterol (VENTOLIN HFA) 108 (90 Base) MCG/ACT inhaler Inhale 2 puffs into the lungs every 6 (six) hours as needed for wheezing or shortness of breath. 11/22/22  Yes Miki Kins, FNP  amLODipine (NORVASC) 5 MG tablet Take 1 tablet (5 mg total) by mouth daily. 11/22/22  Yes Miki Kins, FNP  clopidogrel (PLAVIX) 75 MG tablet Take 1 tablet (75 mg total) by mouth daily. 11/22/22  Yes Miki Kins, FNP  gabapentin (NEURONTIN) 300 MG capsule Take 1 capsule by mouth every morning and  take 2 capsules by mouth at bedtime daily 11/22/22  Yes Miki Kins, FNP  ipratropium-albuterol (DUONEB) 0.5-2.5 (3) MG/3ML SOLN Take 3 mLs by nebulization every 6 (six) hours as needed. 11/22/22  Yes Miki Kins, FNP  pantoprazole (PROTONIX) 40 MG tablet Take 1 tablet (40 mg total) by mouth every morning. 11/22/22  Yes Miki Kins, FNP  rosuvastatin (CRESTOR) 20 MG tablet Take 1 tablet (20 mg total) by mouth  daily. 11/22/22  Yes Miki Kins, FNP  Vitamin D, Ergocalciferol, (DRISDOL) 1.25 MG (50000 UNIT) CAPS capsule Take 1 capsule (50,000 Units total) by mouth every 7 (seven) days. 11/23/22  Yes Miki Kins, FNP  traMADol (ULTRAM) 50 MG tablet Take 1 tablet (50 mg total) by mouth every 8 (eight) hours as needed. Patient not taking: Reported on 03/06/2023 11/22/22   Miki Kins, FNP    Social History   Socioeconomic History   Marital status: Single    Spouse name: Not on file   Number of children: Not on file   Years of education: Not on file   Highest education level: Not on file  Occupational History   Not on file  Tobacco Use   Smoking status: Every Day    Current packs/day: 1.00    Average packs/day: 1 pack/day for 50.0 years (50.0 ttl pk-yrs)    Types: Cigarettes   Smokeless tobacco: Never  Vaping Use   Vaping status: Never Used  Substance and Sexual Activity   Alcohol use: Yes    Alcohol/week: 3.0 standard drinks of alcohol    Types: 3 Cans of beer per week    Comment: occasional   Drug use: No   Sexual activity: Not on file  Other Topics Concern   Not on file  Social History Narrative   Not on file   Social Determinants of Health   Financial Resource Strain: Not on file  Food Insecurity: Not on file  Transportation Needs: Not on file  Physical Activity: Not on file  Stress: Not on file  Social Connections: Not on file  Intimate Partner Violence: Not on file     Family History  Problem Relation Age of Onset   Breast cancer Sister 55   Breast cancer Other     ROS: Otherwise negative unless mentioned in HPI  Physical Examination  Vitals:   03/06/23 2136 03/07/23 0300  BP: (!) 177/68 (!) 164/72  Pulse: 83 89  Resp: 20 17  Temp: 98.8 F (37.1 C) 98.3 F (36.8 C)  SpO2: 95% 95%   Body mass index is 28.29 kg/m.  General:  WDWN in NAD Gait: Not observed HENT: WNL, normocephalic Pulmonary: normal non-labored breathing, without Rales,  rhonchi,  wheezing Cardiac: regular, without  Positive Murmur, without rubs or gallops; without carotid bruits Abdomen: Positive bowel Sounds, soft, NT/ND, no masses Skin: without rashes Vascular Exam/Pulses: Right lower extremity warm to touch with palpable pulses. Left lower extremity very cool to touch from mid-calf to foot and toes.  Extremities: with ischemic changes, without Gangrene , without cellulitis; without open wounds;  Musculoskeletal: no muscle wasting or atrophy  Neurologic: A&O X 3;  No focal weakness or paresthesias are detected; speech is fluent/normal Psychiatric:  The pt has Normal affect. Lymph:  Unremarkable  CBC    Component Value Date/Time   WBC 10.8 (H) 03/06/2023 0844   RBC 3.78 (L) 03/06/2023 0844   HGB 12.6 03/06/2023 0844   HCT 37.8 03/06/2023 0844   PLT 306 03/06/2023  0844   MCV 100.0 03/06/2023 0844   MCH 33.3 03/06/2023 0844   MCHC 33.3 03/06/2023 0844   RDW 13.4 03/06/2023 0844   LYMPHSABS 2.7 03/06/2023 0844   MONOABS 1.1 (H) 03/06/2023 0844   EOSABS 0.0 03/06/2023 0844   BASOSABS 0.0 03/06/2023 0844    BMET    Component Value Date/Time   NA 134 (L) 03/06/2023 0844   NA 138 11/22/2022 1042   K 3.8 03/06/2023 0844   CL 100 03/06/2023 0844   CO2 23 03/06/2023 0844   GLUCOSE 110 (H) 03/06/2023 0844   BUN 12 03/06/2023 0844   BUN 18 11/22/2022 1042   CREATININE 0.71 03/06/2023 0844   CALCIUM 9.1 03/06/2023 0844   GFRNONAA >60 03/06/2023 0844   GFRAA >60 09/15/2018 1742    COAGS: Lab Results  Component Value Date   INR 1.04 01/05/2017     Non-Invasive Vascular Imaging:   EXAM: CT ANGIOGRAPHY OF ABDOMINAL AORTA WITH ILIOFEMORAL RUNOFF   TECHNIQUE: Multidetector CT imaging of the abdomen, pelvis and lower extremities was performed using the standard protocol during bolus administration of intravenous contrast. Multiplanar CT image reconstructions and MIPs were obtained to evaluate the vascular anatomy.   RADIATION DOSE  REDUCTION: This exam was performed according to the departmental dose-optimization program which includes automated exposure control, adjustment of the mA and/or kV according to patient size and/or use of iterative reconstruction technique.   CONTRAST:  OMNIPAQUE IOHEXOL 350 MG/ML SOLN   COMPARISON:  None Available.   FINDINGS: VASCULAR   Aorta: Moderate amount of irregular mixed calcified and noncalcified atherosclerotic plaque within a normal caliber abdominal aorta, not resulting in hemodynamically significant stenosis. No evidence of abdominal aortic dissection or perivascular stranding.   Celiac: There is a minimal amount of calcified atherosclerotic plaque involving the origin of the celiac artery, not resulting in a hemodynamically significant stenosis. An accessory left hepatic artery is noted to arise from the left gastric artery.   SMA: There is a minimal amount of mixed calcified and noncalcified atherosclerotic plaque involving the origin and main trunk of the SMA, not definitely resulting in hemodynamically significant stenosis. The distal tributaries the SMA appear widely patent without discrete intraluminal filling defect to suggest distal embolism.   Renals: Left renal artery is duplicated with tiny accessory left renal artery which supplies the inferior pole of the left kidney. Solitary right renal artery. There is a moderate amount of mixed calcified and noncalcified atherosclerotic plaque involving the origin of the bilateral renal arteries approaching 50% luminal narrowing bilaterally however this finding is without associated delayed renal atrophy or asymmetric renal enhancement.   IMA: Remains patent.   _________________________________________________________   RIGHT Lower Extremity   Inflow: Minimal amount of mixed calcified and noncalcified atherosclerotic plaque within a normal caliber right common iliac artery, not resulting in  hemodynamically significant stenosis. The right internal iliac artery is diseased though patent and of normal caliber. There is a moderate amount of mixed calcified and noncalcified atherosclerotic plaque involving the origin of the right external iliac artery resulting in 50% luminal narrowing (image 139, series 13). The remainder of the right external iliac artery is diseased though without a hemodynamically significant narrowing.   Outflow: Moderate amount of atherosclerotic plaque within the right common femoral artery, not definitely resulting in hemodynamically significant narrowing. The right deep femoral artery is diseased though of normal caliber and remains patent.   There is a moderate amount of slightly irregular mixed calcified and noncalcified atherosclerotic plaque involving  the origin and proximal aspect of the right superficial femoral artery with a least 1 portion resulting in at least 50% luminal narrowing (image 40, series 14). The mid and distal aspects of the right superficial femoral artery are diseased though of normal caliber and without hemodynamically significant narrowing.   Post stenting of the right above and below-knee popliteal artery. The stent appears widely patent without evidence of in stent stenosis.   Runoff: The popliteal arterial stent appears to extend to involve the origin of the right peroneal artery. Solitary vessel runoff to the lower leg via the peroneal artery which appears to partially reconstitutes the posterior tibial arterial vascular distribution at the level of the calcaneus. A patent right-sided dorsalis pedis artery is not identified.   _________________________________________________________   LEFT Lower Extremity   Inflow: The left common iliac artery is diseased though patent and of normal caliber and without a hemodynamically significant narrowing. The left internal iliac artery is diseased though patent and of normal  caliber. Patient has undergone stenting of the left external iliac artery. The external iliac arterial stent appears widely patent without evidence of in stent stenosis. The remainder of the left external iliac artery is diseased though without a hemodynamically significant narrowing.   Outflow: The left common femoral artery is diseased though of normal caliber and without hemodynamically significant narrowing. The left internal iliac artery is diseased though of normal caliber remains patent.   There is a moderate amount of mixed calcified and noncalcified atherosclerotic plaque involving the proximal aspect of the left superficial femoral artery resulting in tandem areas approaching 50% luminal narrowing (representative image 63, series 14).   The patient is post stenting of both the distal aspect of the left superficial femoral artery as well as both the left above and below-knee popliteal arteries however all of the stents appear occluded.   Runoff: There is a reconstituted atretic single-vessel runoff to the left lower leg via the peroneal artery. There is no significant arterial supply to the left foot below the level of the ankle mortise. Left-sided dorsalis pedis artery is not identified.   Veins: The IVC and pelvic venous systems appear widely patent on this arterial phase examination.   Review of the MIP images confirms the above findings.   _________________________________________________________   _________________________________________________________   NON-VASCULAR   Evaluation of the abdominal organs is limited to the arterial phase of enhancement.   Lower chest: Limited visualization of the lower thorax demonstrates minimal dependent subpleural ground-glass atelectasis. No discrete focal airspace opacities.   Borderline cardiomegaly. Calcifications involving the mitral valve annulus. Trace amount of pericardial fluid, presumably physiologic.    Hepatobiliary: Normal hepatic contour. No discrete hyperenhancing hepatic lesions. Postcholecystectomy. No intra or extrahepatic biliary ductal dilatation. No ascites.   Pancreas: Normal appearance of the pancreas.   Spleen: Normal appearance of the spleen.   Adrenals/Urinary Tract: There is symmetric enhancement of the bilateral kidneys. Note is made of mildly exaggerated fetal lobulation of the bilateral kidneys. No discrete renal lesions. No urinary obstruction or perinephric stranding. Extensive calcifications about the bilateral renal hila are favored to be vascular in etiology. No definite evidence of nephrolithiasis on this postcontrast examination.   Mild thickening of the crux of the left adrenal gland without discrete nodule. Normal appearance of the right adrenal gland.   Normal appearance of the urinary bladder given degree of distention.   Stomach/Bowel: Moderate-to-large colonic stool burden without evidence of enteric obstruction. Colonic diverticulosis without evidence superimposed acute diverticulitis. Normal appearance  of the terminal ileum. The appendix is not visualized however there is no pericecal inflammatory change. No significant hiatal hernia. No pneumoperitoneum, pneumatosis or portal venous gas.   Lymphatic: Scattered numerous and mildly prominent retroperitoneal and pelvic lymph nodes are individually not enlarged by size criteria with index aortocaval lymph node measuring 0.7 cm in greatest short axis diameter (image 90, series 13), index right common iliac chain lymph node measuring 0.8 cm (image 118, series 13), and index left pelvic sidewall lymph node measuring 0.8 cm (image 165, series 13), presumably reactive in etiology. No bulky retroperitoneal, mesenteric, pelvic or inguinal lymph adenopathy.   Reproductive: Normal appearance of the pelvic organs for age. No free fluid in the pelvic cul-de-sac.   Other: Minimal amount of subcutaneous  edema about the midline of the low back. Dermal calcifications are noted about the lower legs bilaterally.   Musculoskeletal: No acute or aggressive osseous abnormalities. Moderate to severe multilevel lumbar spine DDD, worse at L4-L5 and L5-S1 with disc space height loss, endplate irregularity and sclerosis. Post right total hip replacement without evidence of hardware failure or loosening.   IMPRESSION: Vascular Impression:   1. Scattered atherosclerotic plaque within a normal caliber abdominal aorta, not resulting in a hemodynamically significant narrowing. Aortic Atherosclerosis (ICD10-I70.0). 2. Suspected hemodynamically significant narrowing involving the origin of the bilateral renal arteries without associated delayed renal enhancement or asymmetric renal atrophy.   Vascular Impression of the left lower extremity:   1. Post stenting of the left external iliac artery without evidence of in stent stenosis or residual hemodynamically significant narrowing. 2. Approximately 50% luminal narrowing involving the uncovered portions of the proximal aspect of the left superficial femoral artery. 3. Left SFA and popliteal arterial stents are occluded. 4. Solitary single-vessel runoff to the left lower leg via a reconstituted atretic peroneal artery. There is no significant arterial supply to the left foot below the level of the ankle mortise. Left-sided dorsalis pedis artery is not identified. No definite evidence of distal embolism.   Vascular Impression of the right lower extremity:   1. Approximately 50% luminal narrowing involving the origin of the right external iliac artery. 2. Irregular mixed calcified and noncalcified atherosclerotic plaque results in tandem areas of at least 50% luminal narrowing involving the proximal aspect of the right superficial femoral artery. The mid and distal aspects of the right superficial femoral artery are without hemodynamically significant  narrowing. 3. Post stenting of the right above and below-knee popliteal artery. The stents appears widely patent without evidence of in stent stenosis. 4. Solitary vessel runoff to the right lower leg via the peroneal artery which appears to partially reconstitute the posterior tibial arterial vascular distribution at the level of the calcaneus. No definite evidence of distal embolism.   Nonvascular Impression:   1. Colonic diverticulosis without evidence superimposed acute diverticulitis.  Statin:  Yes.   Beta Blocker:  No. Aspirin:  Yes.   ACEI:  No. ARB:  No. CCB use:  Yes Other antiplatelets/anticoagulants:  Yes.   Plavix   ASSESSMENT/PLAN: This is a 67 y.o. female who presents to [facility] emergency department due to left foot pain. CTA shows occluded left femoral and popliteal arteries and prior stents.   Vascular Surgery plans on taking the patient to the vascular lab on 03/07/2023 for a left lower extremity angiogram with possible intervention. I discussed in detail with the patient the procedure, benefits, risks and complication. She verbalized her understanding and would like to proceed. I answered all of her questions  today. Patient endorses being NPO after midnight.    -I discussed the plan in detail with Dr Festus Barren MD and he agrees with the plan.    [name] [facility] 03/07/2023 7:24 AM

## 2023-03-07 NOTE — ED Notes (Signed)
Pt to IR

## 2023-03-07 NOTE — Consult Note (Signed)
PHARMACY - ANTICOAGULATION CONSULT NOTE  Pharmacy Consult for Heparin Indication:  ischemic limb  Allergies  Allergen Reactions   Lisinopril-Hydrochlorothiazide Other (See Comments)    Patient Measurements: No weight or hight in epic > 1hr after consult placed. Data taken from most recent OV in care everywhere   Hight: 5'6" Weight: 102.5kg on 11/30/2022 IBW :59 kg Heparin Dosing Weight: 81 kg  Vital Signs: Temp: 98.3 F (36.8 C) (12/09 0300) Temp Source: Oral (12/09 0300) BP: 192/79 (12/09 0825) Pulse Rate: 84 (12/09 0825)  Labs: Recent Labs    03/06/23 0844 03/07/23 0047 03/07/23 0736  HGB 12.6  --  11.5*  HCT 37.8  --  33.2*  PLT 306  --  257  HEPARINUNFRC  --  0.35 0.34  CREATININE 0.71  --  0.67   Estimated Creatinine Clearance: 69.1 mL/min (by C-G formula based on SCr of 0.67 mg/dL).  Medical History: Past Medical History:  Diagnosis Date   Arthritis    Bronchitis    COPD (chronic obstructive pulmonary disease) (HCC)    GERD (gastroesophageal reflux disease)    Gout    Hypertension     Medications:  No AC prior to admission per chart review  Assessment: 67yo female with PMH of PAD s/p angiography (most recent June 2024), hypertension, hyperlipidemia, bilateral carotid artery disease, frequent falls, B12 deficiency, hypothyroidism, gout, COPD. Admitted to ED with left foot pain and limb ischemia. Pharmacy consulted ti manage heparin infusion.  Goal of Therapy:  Heparin level 0.3-0.7 units/ml Monitor platelets by anticoagulation protocol: Yes   12/9:  HL @ 0047 = 0.35, therapeutic X 1 12/9:  HL @ 0736 = 0.34, therapeutic x 2  Plan:  HL therapeutic x 2 Continue heparin infusion at 1200 units/hr Recheck HL with AM labs Daily CBC while on heparin   Barrie Folk, PharmD 03/07/2023 8:33 AM

## 2023-03-07 NOTE — Op Note (Signed)
[facility]  Percutaneous Study/Intervention Procedural Note   Date of Surgery: 03/07/2023  Surgeon(s):Vadhir Mcnay    Assistants:none  Pre-operative Diagnosis: PAD with rest pain LLE, acute on chronic  ischemia  Post-operative diagnosis:  Same  Procedure(s) Performed:             1.  Ultrasound guidance for vascular access right femoral artery             2.  Catheter placement into left peroneal artery from right femoral approach             3.  Aortogram and selective left lower extremity angiogram             4.  Mechanical thrombectomy of the left SFA, popliteal artery, and tibioperoneal trunk with the [place] Rex device             5.  Percutaneous transluminal angioplasty of the left peroneal artery and tibioperoneal trunk with 2.5 mm diameter angioplasty balloon  6.  Catheter directed thrombolytic therapy with 8 mg of tPA and placement of an infusion catheter for continued thrombolytic therapy with 135 cm total length 30 cm working catheter             7.  Ultrasound guidance for vascular access right femoral vein  8.  Placement of a right femoral venous triple-lumen catheter  EBL: 50 cc  Contrast: 40 cc  Fluoro Time: 7.6 minutes  Moderate Conscious Sedation Time: approximately 54 minutes using 2 mg of Versed and 50 mcg of Fentanyl              Indications:  Patient is a 67 y.o.female with a long history of peripheral arterial disease with rest pain of the left lower extremity representing acute on chronic ischemia. The patient had a CT scan showing extensive thrombosis of the left lower extremity throughout the SFA and popliteal arteries with very poor runoff.  The patient is brought in for angiography for further evaluation and potential treatment.  Due to the limb threatening nature of the situation, angiogram was performed for attempted limb salvage. The patient is aware that if the procedure fails, amputation would be expected.  The patient also  understands that even with successful revascularization, amputation may still be required due to the severity of the situation.  Risks and benefits are discussed and informed consent is obtained.   Procedure:  The patient was identified and appropriate procedural time out was performed.  The patient was then placed supine on the table and prepped and draped in the usual sterile fashion. Moderate conscious sedation was administered during a face to face encounter with the patient throughout the procedure with my supervision of the RN administering medicines and monitoring the patient's vital signs, pulse oximetry, telemetry and mental status throughout from the start of the procedure until the patient was taken to the recovery room. Ultrasound was used to evaluate the right common femoral artery.  It was patent .  A digital ultrasound image was acquired.  A Seldinger needle was used to access the right common femoral artery under direct ultrasound guidance and a permanent image was performed.  A 0.035 J wire was advanced without resistance and a 5Fr sheath was placed.  Pigtail catheter was placed into the aorta and an AP aortogram was performed. This demonstrated normal renal arteries and normal aorta and iliac segments without significant stenosis after previous left iliac and common femoral interventions. I then crossed the aortic bifurcation and advanced  to the left femoral head. Selective left lower extremity angiogram was then performed. This demonstrated the left common femoral intervention to continue to be patent.  The most proximal SFA was patent, but there was an abrupt occlusion at the top of the previously placed stent in the mid SFA consistent with thrombosis with extremely sluggish runoff distally.  The stents that have been previously placed were all thrombosed and there was a stent fracture in the midsegment near Hunter's canal.  Only the peroneal artery reconstituted and this was heavily diseased  at the proximal segment and appeared to have a greater than 80% stenosis near its origin.  With this extensive thrombus, thrombectomy would clearly be required. It was felt that it was in the patient's best interest to proceed with intervention after these images to avoid a second procedure and a larger amount of contrast and fluoroscopy based off of the findings from the initial angiogram. The patient was systemically heparinized and a 6 [place] destination sheath was then placed over the Terumo Advantage wire. I then used a Kumpe catheter and the advantage wire to navigate through the occlusion and get down into the peroneal artery and confirm intraluminal flow distally.  A V18 wire was then placed and 8 mg of tPA were instilled in the left SFA and popliteal arteries.  After this dwelled, I used the [place] Rex device to perform mechanical thrombectomy for the extensive thrombus throughout the left SFA, popliteal artery, and tibioperoneal trunk.  Approximately 5 passes were made and these vessels with a large amount of thrombus removed but a significant amount of residual thrombus remaining.  I elected to perform angioplasty of the tibioperoneal trunk and peroneal artery to try to improve runoff distally for patency.  A 2.5 mm diameter by 22 cm length angioplasty balloon was inflated in the tibioperoneal trunk and peroneal artery and taken up to 10 atm for 1 minute.  Completion imaging showed the narrowing that was the origin of the peroneal artery markedly improved, and so I made another pass with the [place] Rex device to try to debulk the thrombus from the SFA and popliteal arteries but there is still minimal improvement with significant residual thrombus remaining and I felt our best chance of patency would be continuous thrombolytic therapy overnight and then likely placing a covered stent through the stent fractures in the SFA and popliteal arteries.  A 135 cm total length 30 cm working length catheter was parked  from the mid left SFA down through the popliteal artery and into the tibioperoneal trunk.  This was secured into place with silk sutures as well as the sheath being secured with a silk suture.  A central line was placed for venipuncture and durable venous access while running thrombolytic therapy.  The right femoral vein was visualized with ultrasound and found to be widely patent.  It was then accessed under direct ultrasound guidance without difficulty with a Seldinger needle and a permanent image was recorded.  A J-wire was placed.  After dilatation, a triple-lumen catheter was placed over the wire and the wire was removed.  All 3 lines withdrew dark red nonpulsatile blood and flushed easily with sterile saline.  It was then secured in place with 2 silk sutures. I elected to terminate the procedure. The patient was taken to the recovery room in stable condition having tolerated the procedure well.  Findings:               Aortogram:  This demonstrated normal renal  arteries and normal aorta and iliac segments without significant stenosis after previous left iliac and common femoral interventions.             Left Lower Extremity:  This demonstrated the left common femoral intervention to continue to be patent.  The most proximal SFA was patent, but there was an abrupt occlusion at the top of the previously placed stent in the mid SFA consistent with thrombosis with extremely sluggish runoff distally.  The stents that have been previously placed were all thrombosed and there was a stent fracture in the midsegment near Hunter's canal.  Only the peroneal artery reconstituted and this was heavily diseased at the proximal segment and appeared to have a greater than 80% stenosis near its origin.   Disposition: Patient was taken to the recovery room in stable condition having tolerated the procedure well.  Complications: None  Festus Barren 03/07/2023 2:11 PM   This note was created with Dragon [employer]  transcription system. Any errors in dictation are purely unintentional.

## 2023-03-07 NOTE — ED Notes (Signed)
Lab requested for heparin level

## 2023-03-07 NOTE — ED Notes (Signed)
Pt sleeping at this time.

## 2023-03-07 NOTE — ED Notes (Addendum)
Pt awoke briefly from sleep and called out "owwy." This Clinical research associate asked if she needed pn medication and she states "yes." Pt back to sleep within 1 minute while this writer assessed circulation in LLE which remains slightly cool with DP pulse unchanged at 1+  Pn medication deferred for now. Continue to monitor for comfort. IV heparin gtt remains at 1200 units/hr with next unfractionated heparin level to be drawn at 0700 per pharmacist's order.

## 2023-03-07 NOTE — Progress Notes (Signed)
PT Cancellation Note  Patient Details Name: ANVIKA NORRICK MRN: [id number] DOB: 16-Jul-1955   Cancelled Treatment:    Reason Eval/Treat Not Completed: Other (comment): Orders Received, Chart Reviewed. Per chart patient is pending vascular consult, and is NPO with order for peripheral cardiac catheterization. Will hold PT evaluation. Will re-attempt at later date/time when medically appropriate.    Creed Copper Fairly, PT, DPT 03/07/23 9:15 AM

## 2023-03-07 NOTE — H&P (View-Only) (Signed)
Hospital Consult    Reason for Consult:  Left Foot Pain  Requesting Physician:  Dr Imagene Gurney MD  MRN #:  [id number]  History of Present Illness: This is a 67 y.o. female with medical history significant of PAD s/p angiography (most recent June 2024), hypertension, hyperlipidemia, bilateral carotid artery disease, frequent falls, B12 deficiency, hypothyroidism, gout, COPD, who presents to the ED due to left foot pain   Mr. Adamek states that she had a fall earlier this week where she fell onto her right knee when she was bringing groceries due to tripping on a branch outside.  She did not hit her left leg at all, however approximately 4 days ago, she began to experience severe left lower leg and ankle pain with swelling.  She sleeps on a air mattress normally and today, she slid off the bed while asleep.  Due to severity of pain, she was unable to get up by herself and EMS was called.   Upon work up patient underwent CTA AO-BIFEM and is noted to have prior stent placement to left SFA and Popliteal arteries. Vascular Surgery consulted to evaluate.  Past Medical History:  Diagnosis Date   Arthritis    Bronchitis    COPD (chronic obstructive pulmonary disease) (HCC)    GERD (gastroesophageal reflux disease)    Gout    Hypertension     Past Surgical History:  Procedure Laterality Date   AMPUTATION Right 07/29/2017   Procedure: AMPUTATION DIGIT/ [id number];  Surgeon: Linus Galas, DPM;  Location: [facility];  Service: Podiatry;  Laterality: Right;   AMPUTATION TOE Right 02/18/2021   Procedure: AMPUTATION TOE-GREAT TOE;  Surgeon: Gwyneth Revels, DPM;  Location: [facility];  Service: Podiatry;  Laterality: Right;   CHOLECYSTECTOMY     facial biopsy Right    JOINT REPLACEMENT Right 12/2016   THR   LOWER EXTREMITY ANGIOGRAPHY Right 09/27/2017   Procedure: LOWER EXTREMITY ANGIOGRAPHY;  Surgeon: Renford Dills, MD;  Location: [facility];  Service: Cardiovascular;  Laterality: Right;    LOWER EXTREMITY ANGIOGRAPHY Right 06/17/2020   Procedure: LOWER EXTREMITY ANGIOGRAPHY;  Surgeon: Renford Dills, MD;  Location: [facility];  Service: Cardiovascular;  Laterality: Right;   LOWER EXTREMITY ANGIOGRAPHY Right 02/18/2021   Procedure: LOWER EXTREMITY ANGIOGRAPHY;  Surgeon: Renford Dills, MD;  Location: [facility];  Service: Cardiovascular;  Laterality: Right;   LOWER EXTREMITY ANGIOGRAPHY Left 09/07/2022   Procedure: Lower Extremity Angiography;  Surgeon: Renford Dills, MD;  Location: [facility];  Service: Cardiovascular;  Laterality: Left;   TONSILLECTOMY     TOTAL HIP ARTHROPLASTY Right 01/17/2017   Procedure: TOTAL HIP ARTHROPLASTY ANTERIOR APPROACH;  Surgeon: Lyndle Herrlich, MD;  Location: [facility];  Service: Orthopedics;  Laterality: Right;    Allergies  Allergen Reactions   Lisinopril-Hydrochlorothiazide Other (See Comments)    Prior to Admission medications   Medication Sig Start Date End Date Taking? Authorizing Provider  albuterol (VENTOLIN HFA) 108 (90 Base) MCG/ACT inhaler Inhale 2 puffs into the lungs every 6 (six) hours as needed for wheezing or shortness of breath. 11/22/22  Yes Miki Kins, FNP  amLODipine (NORVASC) 5 MG tablet Take 1 tablet (5 mg total) by mouth daily. 11/22/22  Yes Miki Kins, FNP  clopidogrel (PLAVIX) 75 MG tablet Take 1 tablet (75 mg total) by mouth daily. 11/22/22  Yes Miki Kins, FNP  gabapentin (NEURONTIN) 300 MG capsule Take 1 capsule by mouth every morning and  take 2 capsules by mouth at bedtime daily 11/22/22  Yes Miki Kins, FNP  ipratropium-albuterol (DUONEB) 0.5-2.5 (3) MG/3ML SOLN Take 3 mLs by nebulization every 6 (six) hours as needed. 11/22/22  Yes Miki Kins, FNP  pantoprazole (PROTONIX) 40 MG tablet Take 1 tablet (40 mg total) by mouth every morning. 11/22/22  Yes Miki Kins, FNP  rosuvastatin (CRESTOR) 20 MG tablet Take 1 tablet (20 mg total) by mouth  daily. 11/22/22  Yes Miki Kins, FNP  Vitamin D, Ergocalciferol, (DRISDOL) 1.25 MG (50000 UNIT) CAPS capsule Take 1 capsule (50,000 Units total) by mouth every 7 (seven) days. 11/23/22  Yes Miki Kins, FNP  traMADol (ULTRAM) 50 MG tablet Take 1 tablet (50 mg total) by mouth every 8 (eight) hours as needed. Patient not taking: Reported on 03/06/2023 11/22/22   Miki Kins, FNP    Social History   Socioeconomic History   Marital status: Single    Spouse name: Not on file   Number of children: Not on file   Years of education: Not on file   Highest education level: Not on file  Occupational History   Not on file  Tobacco Use   Smoking status: Every Day    Current packs/day: 1.00    Average packs/day: 1 pack/day for 50.0 years (50.0 ttl pk-yrs)    Types: Cigarettes   Smokeless tobacco: Never  Vaping Use   Vaping status: Never Used  Substance and Sexual Activity   Alcohol use: Yes    Alcohol/week: 3.0 standard drinks of alcohol    Types: 3 Cans of beer per week    Comment: occasional   Drug use: No   Sexual activity: Not on file  Other Topics Concern   Not on file  Social History Narrative   Not on file   Social Determinants of Health   Financial Resource Strain: Not on file  Food Insecurity: Not on file  Transportation Needs: Not on file  Physical Activity: Not on file  Stress: Not on file  Social Connections: Not on file  Intimate Partner Violence: Not on file     Family History  Problem Relation Age of Onset   Breast cancer Sister 55   Breast cancer Other     ROS: Otherwise negative unless mentioned in HPI  Physical Examination  Vitals:   03/06/23 2136 03/07/23 0300  BP: (!) 177/68 (!) 164/72  Pulse: 83 89  Resp: 20 17  Temp: 98.8 F (37.1 C) 98.3 F (36.8 C)  SpO2: 95% 95%   Body mass index is 28.29 kg/m.  General:  WDWN in NAD Gait: Not observed HENT: WNL, normocephalic Pulmonary: normal non-labored breathing, without Rales,  rhonchi,  wheezing Cardiac: regular, without  Positive Murmur, without rubs or gallops; without carotid bruits Abdomen: Positive bowel Sounds, soft, NT/ND, no masses Skin: without rashes Vascular Exam/Pulses: Right lower extremity warm to touch with palpable pulses. Left lower extremity very cool to touch from mid-calf to foot and toes.  Extremities: with ischemic changes, without Gangrene , without cellulitis; without open wounds;  Musculoskeletal: no muscle wasting or atrophy  Neurologic: A&O X 3;  No focal weakness or paresthesias are detected; speech is fluent/normal Psychiatric:  The pt has Normal affect. Lymph:  Unremarkable  CBC    Component Value Date/Time   WBC 10.8 (H) 03/06/2023 0844   RBC 3.78 (L) 03/06/2023 0844   HGB 12.6 03/06/2023 0844   HCT 37.8 03/06/2023 0844   PLT 306 03/06/2023  0844   MCV 100.0 03/06/2023 0844   MCH 33.3 03/06/2023 0844   MCHC 33.3 03/06/2023 0844   RDW 13.4 03/06/2023 0844   LYMPHSABS 2.7 03/06/2023 0844   MONOABS 1.1 (H) 03/06/2023 0844   EOSABS 0.0 03/06/2023 0844   BASOSABS 0.0 03/06/2023 0844    BMET    Component Value Date/Time   NA 134 (L) 03/06/2023 0844   NA 138 11/22/2022 1042   K 3.8 03/06/2023 0844   CL 100 03/06/2023 0844   CO2 23 03/06/2023 0844   GLUCOSE 110 (H) 03/06/2023 0844   BUN 12 03/06/2023 0844   BUN 18 11/22/2022 1042   CREATININE 0.71 03/06/2023 0844   CALCIUM 9.1 03/06/2023 0844   GFRNONAA >60 03/06/2023 0844   GFRAA >60 09/15/2018 1742    COAGS: Lab Results  Component Value Date   INR 1.04 01/05/2017     Non-Invasive Vascular Imaging:   EXAM: CT ANGIOGRAPHY OF ABDOMINAL AORTA WITH ILIOFEMORAL RUNOFF   TECHNIQUE: Multidetector CT imaging of the abdomen, pelvis and lower extremities was performed using the standard protocol during bolus administration of intravenous contrast. Multiplanar CT image reconstructions and MIPs were obtained to evaluate the vascular anatomy.   RADIATION DOSE  REDUCTION: This exam was performed according to the departmental dose-optimization program which includes automated exposure control, adjustment of the mA and/or kV according to patient size and/or use of iterative reconstruction technique.   CONTRAST:  OMNIPAQUE IOHEXOL 350 MG/ML SOLN   COMPARISON:  None Available.   FINDINGS: VASCULAR   Aorta: Moderate amount of irregular mixed calcified and noncalcified atherosclerotic plaque within a normal caliber abdominal aorta, not resulting in hemodynamically significant stenosis. No evidence of abdominal aortic dissection or perivascular stranding.   Celiac: There is a minimal amount of calcified atherosclerotic plaque involving the origin of the celiac artery, not resulting in a hemodynamically significant stenosis. An accessory left hepatic artery is noted to arise from the left gastric artery.   SMA: There is a minimal amount of mixed calcified and noncalcified atherosclerotic plaque involving the origin and main trunk of the SMA, not definitely resulting in hemodynamically significant stenosis. The distal tributaries the SMA appear widely patent without discrete intraluminal filling defect to suggest distal embolism.   Renals: Left renal artery is duplicated with tiny accessory left renal artery which supplies the inferior pole of the left kidney. Solitary right renal artery. There is a moderate amount of mixed calcified and noncalcified atherosclerotic plaque involving the origin of the bilateral renal arteries approaching 50% luminal narrowing bilaterally however this finding is without associated delayed renal atrophy or asymmetric renal enhancement.   IMA: Remains patent.   _________________________________________________________   RIGHT Lower Extremity   Inflow: Minimal amount of mixed calcified and noncalcified atherosclerotic plaque within a normal caliber right common iliac artery, not resulting in  hemodynamically significant stenosis. The right internal iliac artery is diseased though patent and of normal caliber. There is a moderate amount of mixed calcified and noncalcified atherosclerotic plaque involving the origin of the right external iliac artery resulting in 50% luminal narrowing (image 139, series 13). The remainder of the right external iliac artery is diseased though without a hemodynamically significant narrowing.   Outflow: Moderate amount of atherosclerotic plaque within the right common femoral artery, not definitely resulting in hemodynamically significant narrowing. The right deep femoral artery is diseased though of normal caliber and remains patent.   There is a moderate amount of slightly irregular mixed calcified and noncalcified atherosclerotic plaque involving  the origin and proximal aspect of the right superficial femoral artery with a least 1 portion resulting in at least 50% luminal narrowing (image 40, series 14). The mid and distal aspects of the right superficial femoral artery are diseased though of normal caliber and without hemodynamically significant narrowing.   Post stenting of the right above and below-knee popliteal artery. The stent appears widely patent without evidence of in stent stenosis.   Runoff: The popliteal arterial stent appears to extend to involve the origin of the right peroneal artery. Solitary vessel runoff to the lower leg via the peroneal artery which appears to partially reconstitutes the posterior tibial arterial vascular distribution at the level of the calcaneus. A patent right-sided dorsalis pedis artery is not identified.   _________________________________________________________   LEFT Lower Extremity   Inflow: The left common iliac artery is diseased though patent and of normal caliber and without a hemodynamically significant narrowing. The left internal iliac artery is diseased though patent and of normal  caliber. Patient has undergone stenting of the left external iliac artery. The external iliac arterial stent appears widely patent without evidence of in stent stenosis. The remainder of the left external iliac artery is diseased though without a hemodynamically significant narrowing.   Outflow: The left common femoral artery is diseased though of normal caliber and without hemodynamically significant narrowing. The left internal iliac artery is diseased though of normal caliber remains patent.   There is a moderate amount of mixed calcified and noncalcified atherosclerotic plaque involving the proximal aspect of the left superficial femoral artery resulting in tandem areas approaching 50% luminal narrowing (representative image 63, series 14).   The patient is post stenting of both the distal aspect of the left superficial femoral artery as well as both the left above and below-knee popliteal arteries however all of the stents appear occluded.   Runoff: There is a reconstituted atretic single-vessel runoff to the left lower leg via the peroneal artery. There is no significant arterial supply to the left foot below the level of the ankle mortise. Left-sided dorsalis pedis artery is not identified.   Veins: The IVC and pelvic venous systems appear widely patent on this arterial phase examination.   Review of the MIP images confirms the above findings.   _________________________________________________________   _________________________________________________________   NON-VASCULAR   Evaluation of the abdominal organs is limited to the arterial phase of enhancement.   Lower chest: Limited visualization of the lower thorax demonstrates minimal dependent subpleural ground-glass atelectasis. No discrete focal airspace opacities.   Borderline cardiomegaly. Calcifications involving the mitral valve annulus. Trace amount of pericardial fluid, presumably physiologic.    Hepatobiliary: Normal hepatic contour. No discrete hyperenhancing hepatic lesions. Postcholecystectomy. No intra or extrahepatic biliary ductal dilatation. No ascites.   Pancreas: Normal appearance of the pancreas.   Spleen: Normal appearance of the spleen.   Adrenals/Urinary Tract: There is symmetric enhancement of the bilateral kidneys. Note is made of mildly exaggerated fetal lobulation of the bilateral kidneys. No discrete renal lesions. No urinary obstruction or perinephric stranding. Extensive calcifications about the bilateral renal hila are favored to be vascular in etiology. No definite evidence of nephrolithiasis on this postcontrast examination.   Mild thickening of the crux of the left adrenal gland without discrete nodule. Normal appearance of the right adrenal gland.   Normal appearance of the urinary bladder given degree of distention.   Stomach/Bowel: Moderate-to-large colonic stool burden without evidence of enteric obstruction. Colonic diverticulosis without evidence superimposed acute diverticulitis. Normal appearance  of the terminal ileum. The appendix is not visualized however there is no pericecal inflammatory change. No significant hiatal hernia. No pneumoperitoneum, pneumatosis or portal venous gas.   Lymphatic: Scattered numerous and mildly prominent retroperitoneal and pelvic lymph nodes are individually not enlarged by size criteria with index aortocaval lymph node measuring 0.7 cm in greatest short axis diameter (image 90, series 13), index right common iliac chain lymph node measuring 0.8 cm (image 118, series 13), and index left pelvic sidewall lymph node measuring 0.8 cm (image 165, series 13), presumably reactive in etiology. No bulky retroperitoneal, mesenteric, pelvic or inguinal lymph adenopathy.   Reproductive: Normal appearance of the pelvic organs for age. No free fluid in the pelvic cul-de-sac.   Other: Minimal amount of subcutaneous  edema about the midline of the low back. Dermal calcifications are noted about the lower legs bilaterally.   Musculoskeletal: No acute or aggressive osseous abnormalities. Moderate to severe multilevel lumbar spine DDD, worse at L4-L5 and L5-S1 with disc space height loss, endplate irregularity and sclerosis. Post right total hip replacement without evidence of hardware failure or loosening.   IMPRESSION: Vascular Impression:   1. Scattered atherosclerotic plaque within a normal caliber abdominal aorta, not resulting in a hemodynamically significant narrowing. Aortic Atherosclerosis (ICD10-I70.0). 2. Suspected hemodynamically significant narrowing involving the origin of the bilateral renal arteries without associated delayed renal enhancement or asymmetric renal atrophy.   Vascular Impression of the left lower extremity:   1. Post stenting of the left external iliac artery without evidence of in stent stenosis or residual hemodynamically significant narrowing. 2. Approximately 50% luminal narrowing involving the uncovered portions of the proximal aspect of the left superficial femoral artery. 3. Left SFA and popliteal arterial stents are occluded. 4. Solitary single-vessel runoff to the left lower leg via a reconstituted atretic peroneal artery. There is no significant arterial supply to the left foot below the level of the ankle mortise. Left-sided dorsalis pedis artery is not identified. No definite evidence of distal embolism.   Vascular Impression of the right lower extremity:   1. Approximately 50% luminal narrowing involving the origin of the right external iliac artery. 2. Irregular mixed calcified and noncalcified atherosclerotic plaque results in tandem areas of at least 50% luminal narrowing involving the proximal aspect of the right superficial femoral artery. The mid and distal aspects of the right superficial femoral artery are without hemodynamically significant  narrowing. 3. Post stenting of the right above and below-knee popliteal artery. The stents appears widely patent without evidence of in stent stenosis. 4. Solitary vessel runoff to the right lower leg via the peroneal artery which appears to partially reconstitute the posterior tibial arterial vascular distribution at the level of the calcaneus. No definite evidence of distal embolism.   Nonvascular Impression:   1. Colonic diverticulosis without evidence superimposed acute diverticulitis.  Statin:  Yes.   Beta Blocker:  No. Aspirin:  Yes.   ACEI:  No. ARB:  No. CCB use:  Yes Other antiplatelets/anticoagulants:  Yes.   Plavix   ASSESSMENT/PLAN: This is a 67 y.o. female who presents to [facility] emergency department due to left foot pain. CTA shows occluded left femoral and popliteal arteries and prior stents.   Vascular Surgery plans on taking the patient to the vascular lab on 03/07/2023 for a left lower extremity angiogram with possible intervention. I discussed in detail with the patient the procedure, benefits, risks and complication. She verbalized her understanding and would like to proceed. I answered all of her questions  today. Patient endorses being NPO after midnight.    -I discussed the plan in detail with Dr Festus Barren MD and he agrees with the plan.    [name] 03/07/2023 7:24 AM

## 2023-03-07 NOTE — ED Notes (Signed)
This Clinical research associate spoke with Barbara Cower, Pharmacist and next unfractionated heparin draw will be at 0630. No change in initial rate was needed with first unfractionated heparin level of 0.35

## 2023-03-07 NOTE — ED Notes (Signed)
Lab at bedside

## 2023-03-08 ENCOUNTER — Encounter
Admission: EM | Disposition: A | Discharge status: Home Health | Payer: Self-pay | Source: Home / Self Care | Attending: Internal Medicine

## 2023-03-08 ENCOUNTER — Encounter: Payer: Self-pay | Admitting: Vascular Surgery

## 2023-03-08 DIAGNOSIS — I998 Other disorder of circulatory system: Secondary | ICD-10-CM | POA: Diagnosis not present

## 2023-03-08 DIAGNOSIS — T82856A Stenosis of peripheral vascular stent, initial encounter: Secondary | ICD-10-CM

## 2023-03-08 HISTORY — PX: LOWER EXTREMITY ANGIOGRAPHY: CATH118251

## 2023-03-08 LAB — BASIC METABOLIC PANEL
Anion gap: 9 (ref 5–15)
BUN: 9 mg/dL (ref 8–23)
CO2: 25 mmol/L (ref 22–32)
Calcium: 8.5 mg/dL — ABNORMAL LOW (ref 8.9–10.3)
Chloride: 100 mmol/L (ref 98–111)
Creatinine, Ser: 0.62 mg/dL (ref 0.44–1.00)
GFR, Estimated: >60 mL/min (ref >60)
Glucose, Bld: 111 mg/dL — ABNORMAL HIGH (ref 70–99)
Potassium: 3.1 mmol/L — ABNORMAL LOW (ref 3.5–5.1)
Sodium: 134 mmol/L — ABNORMAL LOW (ref 135–145)

## 2023-03-08 LAB — CBC
HCT: 29.9 % — ABNORMAL LOW (ref 36.0–46.0)
HCT: 29.9 % — ABNORMAL LOW (ref 36.0–46.0)
HCT: 31.1 % — ABNORMAL LOW (ref 36.0–46.0)
Hemoglobin: 10.1 g/dL — ABNORMAL LOW (ref 12.0–15.0)
Hemoglobin: 10.3 g/dL — ABNORMAL LOW (ref 12.0–15.0)
Hemoglobin: 10.5 g/dL — ABNORMAL LOW (ref 12.0–15.0)
MCH: 33 pg (ref 26.0–34.0)
MCH: 33 pg (ref 26.0–34.0)
MCH: 33.1 pg (ref 26.0–34.0)
MCHC: 33.8 g/dL (ref 30.0–36.0)
MCHC: 33.8 g/dL (ref 30.0–36.0)
MCHC: 34.4 g/dL (ref 30.0–36.0)
MCV: 95.8 fL (ref 80.0–100.0)
MCV: 97.7 fL (ref 80.0–100.0)
MCV: 98.1 fL (ref 80.0–100.0)
Platelets: 192 10*3/uL (ref 150–400)
Platelets: 195 10*3/uL (ref 150–400)
Platelets: 201 10*3/uL (ref 150–400)
RBC: 3.06 MIL/uL — ABNORMAL LOW (ref 3.87–5.11)
RBC: 3.12 MIL/uL — ABNORMAL LOW (ref 3.87–5.11)
RBC: 3.17 MIL/uL — ABNORMAL LOW (ref 3.87–5.11)
RDW: 13.4 % (ref 11.5–15.5)
RDW: 13.4 % (ref 11.5–15.5)
RDW: 13.5 % (ref 11.5–15.5)
WBC: 8.5 10*3/uL (ref 4.0–10.5)
WBC: 9.6 10*3/uL (ref 4.0–10.5)
WBC: 9.6 10*3/uL (ref 4.0–10.5)
nRBC: 0 % (ref 0.0–0.2)
nRBC: 0 % (ref 0.0–0.2)
nRBC: 0 % (ref 0.0–0.2)

## 2023-03-08 LAB — HEPARIN LEVEL (UNFRACTIONATED)
Heparin Unfractionated: <0.1 [IU]/mL — ABNORMAL LOW (ref 0.30–0.70)
Heparin Unfractionated: 0.12 [IU]/mL — ABNORMAL LOW (ref 0.30–0.70)

## 2023-03-08 LAB — MAGNESIUM: Magnesium: 1.7 mg/dL (ref 1.7–2.4)

## 2023-03-08 LAB — FIBRINOGEN: Fibrinogen: 462 mg/dL (ref 210–475)

## 2023-03-08 LAB — GLUCOSE, CAPILLARY: Glucose-Capillary: 102 mg/dL — ABNORMAL HIGH (ref 70–99)

## 2023-03-08 SURGERY — LOWER EXTREMITY ANGIOGRAPHY
Anesthesia: Moderate Sedation | Laterality: Left

## 2023-03-08 MED ORDER — HEPARIN (PORCINE) IN NACL 2000-0.9 UNIT/L-% IV SOLN
INTRAVENOUS | Status: DC | PRN
  Administered 2023-03-08: 1000 mL

## 2023-03-08 MED ORDER — SODIUM CHLORIDE 0.9 % IV SOLN: INTRAVENOUS | Status: AC

## 2023-03-08 MED ORDER — NITROGLYCERIN 1 MG/10 ML FOR IR/CATH LAB
INTRA_ARTERIAL | Status: AC
Start: 2023-03-08 — End: ?
  Filled 2023-03-08: qty 10

## 2023-03-08 MED ORDER — FENTANYL CITRATE PF 50 MCG/ML IJ SOSY
PREFILLED_SYRINGE | INTRAMUSCULAR | Status: AC
  Filled 2023-03-08: qty 1

## 2023-03-08 MED ORDER — SODIUM CHLORIDE 0.9% FLUSH: 3.0000 mL | INTRAVENOUS | Status: DC | PRN

## 2023-03-08 MED ORDER — HEPARIN SODIUM (PORCINE) 1000 UNIT/ML IJ SOLN
INTRAMUSCULAR | Status: DC | PRN
  Administered 2023-03-08: 4000 [IU] via INTRAVENOUS

## 2023-03-08 MED ORDER — FENTANYL CITRATE (PF) 100 MCG/2ML IJ SOLN
INTRAMUSCULAR | Status: DC | PRN
  Administered 2023-03-08 (×4): 25 ug via INTRAVENOUS

## 2023-03-08 MED ORDER — IODIXANOL 320 MG/ML IV SOLN
INTRAVENOUS | Status: DC | PRN
  Administered 2023-03-08: 45 mL via INTRA_ARTERIAL

## 2023-03-08 MED ORDER — CEFAZOLIN SODIUM-DEXTROSE 2-4 GM/100ML-% IV SOLN
INTRAVENOUS | Status: AC
  Filled 2023-03-08: qty 100

## 2023-03-08 MED ORDER — CHLORHEXIDINE GLUCONATE CLOTH 2 % EX PADS: 6.0000 | MEDICATED_PAD | Freq: Every day | CUTANEOUS | Status: DC

## 2023-03-08 MED ORDER — NITROGLYCERIN 1 MG/10 ML FOR IR/CATH LAB
INTRA_ARTERIAL | Status: DC | PRN
  Administered 2023-03-08: 300 ug via INTRA_ARTERIAL
  Administered 2023-03-08: 400 ug via INTRA_ARTERIAL

## 2023-03-08 MED ORDER — CEFAZOLIN SODIUM-DEXTROSE 1-4 GM/50ML-% IV SOLN
INTRAVENOUS | Status: AC
  Filled 2023-03-08: qty 50

## 2023-03-08 MED ORDER — TIROFIBAN HCL IN NACL 5-0.9 MG/100ML-% IV SOLN
INTRAVENOUS | Status: AC
  Administered 2023-03-08: 0.15 ug/kg/min via INTRAVENOUS
  Filled 2023-03-08: qty 100

## 2023-03-08 MED ORDER — POTASSIUM CHLORIDE 20 MEQ PO PACK: 40.0000 meq | PACK | Freq: Once | ORAL | Status: DC

## 2023-03-08 MED ORDER — MIDAZOLAM HCL 2 MG/2ML IJ SOLN
INTRAMUSCULAR | Status: AC
  Filled 2023-03-08: qty 2

## 2023-03-08 MED ORDER — HEPARIN SODIUM (PORCINE) 1000 UNIT/ML IJ SOLN
INTRAMUSCULAR | Status: AC
  Filled 2023-03-08: qty 10

## 2023-03-08 MED ORDER — POTASSIUM CHLORIDE CRYS ER 20 MEQ PO TBCR
40.0000 meq | EXTENDED_RELEASE_TABLET | Freq: Once | ORAL | Status: AC
  Administered 2023-03-08: 40 meq via ORAL
  Filled 2023-03-08: qty 2

## 2023-03-08 MED ORDER — LABETALOL HCL 5 MG/ML IV SOLN: 10.0000 mg | INTRAVENOUS | Status: DC | PRN

## 2023-03-08 MED ORDER — ONDANSETRON HCL 4 MG/2ML IJ SOLN
4.0000 mg | Freq: Four times a day (QID) | INTRAMUSCULAR | Status: DC | PRN
  Administered 2023-03-08: 4 mg via INTRAVENOUS
  Filled 2023-03-08: qty 2

## 2023-03-08 MED ORDER — MORPHINE SULFATE (PF) 4 MG/ML IV SOLN: 2.0000 mg | INTRAVENOUS | Status: DC | PRN

## 2023-03-08 MED ORDER — TIROFIBAN HCL IN NACL 5-0.9 MG/100ML-% IV SOLN
0.1500 ug/kg/min | INTRAVENOUS | Status: AC
  Administered 2023-03-08 – 2023-03-09 (×3): 0.15 ug/kg/min via INTRAVENOUS
  Filled 2023-03-08 (×3): qty 100

## 2023-03-08 MED ORDER — CEFAZOLIN SODIUM-DEXTROSE 1-4 GM/50ML-% IV SOLN
1.0000 g | Freq: Once | INTRAVENOUS | Status: AC
  Administered 2023-03-08: 1 g via INTRAVENOUS
  Filled 2023-03-08: qty 50

## 2023-03-08 MED ORDER — MIDAZOLAM HCL 2 MG/2ML IJ SOLN
INTRAMUSCULAR | Status: DC | PRN
  Administered 2023-03-08 (×4): 1 mg via INTRAVENOUS

## 2023-03-08 MED ORDER — SODIUM CHLORIDE 0.9 % IV SOLN: 250.0000 mL | INTRAVENOUS | Status: AC | PRN

## 2023-03-08 MED ORDER — LIDOCAINE HCL (PF) 1 % IJ SOLN
INTRAMUSCULAR | Status: DC | PRN
  Administered 2023-03-08: 10 mL

## 2023-03-08 MED ORDER — SODIUM CHLORIDE 0.9% FLUSH
3.0000 mL | Freq: Two times a day (BID) | INTRAVENOUS | Status: DC
  Administered 2023-03-08 – 2023-03-10 (×5): 3 mL via INTRAVENOUS

## 2023-03-08 SURGICAL SUPPLY — 19 items
BALLN LUTONIX 018 4X100X130 (BALLOONS) ×1
BALLN LUTONIX 018 5X300X130 (BALLOONS) ×1
BALLN LUTONIX 018 6X100X130 (BALLOONS) ×1
BALLN LUTONIX DCB 4X60X130 (BALLOONS) ×1
BALLOON LUTONIX 018 4X100X130 (BALLOONS) IMPLANT
BALLOON LUTONIX 018 5X300X130 (BALLOONS) IMPLANT
BALLOON LUTONIX 018 6X100X130 (BALLOONS) IMPLANT
BALLOON LUTONIX DCB 4X60X130 (BALLOONS) IMPLANT
CATH SEEKER .035X135CM (CATHETERS) IMPLANT
DEVICE PRESTO INFLATION (MISCELLANEOUS) IMPLANT
DEVICE STARCLOSE SE CLOSURE (Vascular Products) IMPLANT
GOWN STRL REUS W/ TWL LRG LVL3 (GOWN DISPOSABLE) ×1 IMPLANT
PACK ANGIOGRAPHY (CUSTOM PROCEDURE TRAY) ×1 IMPLANT
STENT VIABAHN 6X250X120 (Permanent Stent) IMPLANT
SUT MNCRL AB 4-0 PS2 18 (SUTURE) IMPLANT
VALVE HEMO TOUHY BORST Y (ADAPTER) IMPLANT
WIRE G V18X300CM (WIRE) IMPLANT
WIRE GUIDERIGHT .035X150 (WIRE) IMPLANT
WIRE ROSEN-J .035X260CM (WIRE) IMPLANT

## 2023-03-08 NOTE — Interval H&P Note (Signed)
History and Physical Interval Note:  03/08/2023 7:52 AM  Jackie Macias  has presented today for surgery, with the diagnosis of ischemic leg.  The various methods of treatment have been discussed with the patient and family. After consideration of risks, benefits and other options for treatment, the patient has consented to  Procedure(s): Lower Extremity Angiography (Left) as a surgical intervention.  The patient's history has been reviewed, patient examined, no change in status, stable for surgery.  I have reviewed the patient's chart and labs.  Questions were answered to the patient's satisfaction.     Levora Dredge

## 2023-03-08 NOTE — Op Note (Signed)
[facility]  Percutaneous Study/Intervention Procedural Note   Date of Surgery: 03/08/2023  Surgeon:  Levora Dredge  Pre-operative Diagnosis:  Atherosclerotic occlusive disease bilateral lower extremities with left lower extremity with rest pain. Critical limb ischemia Follow-up status post thrombolytic therapy overnight  Post-operative diagnosis:  Same  Procedure(s) Performed:             1.  Introduction catheter into left lower extremity 3rd order catheter placement               2.    Contrast injection left lower extremity for distal runoff             3.  Percutaneous transluminal angioplasty and stent placement left superficial femoral and popliteal arteries to 6 mm             4.  Percutaneous transluminal angioplasty left peroneal artery to approximately 4 mm             5.  Star close closure right common femoral arteriotomy  Anesthesia: Conscious sedation was administered under my direct supervision by the interventional radiology RN. IV Versed plus fentanyl were utilized. Continuous ECG, pulse oximetry and blood pressure was monitored throughout the entire procedure.  Conscious sedation was for a total of 83 minutes.  Sheath: 6 French destination sheath right common femoral retrograde already existing  Contrast: 45 cc  Fluoroscopy Time: 11.7 minutes  Indications:  Jackie Macias presents with increasing pain of the left lower extremity.  She presented yesterday with acute ischemia of her lower extremity and underwent thrombectomy with initiation of thrombolysis.  She has now returning to the lab for follow-up treatment.  This suggests the patient is having limb threatening ischemia. The risks and benefits are reviewed all questions answered patient agrees to proceed.  Procedure:   Jackie Macias is a 67 y.o. y.o. female who was identified and appropriate procedural time out was performed.  The patient was then placed supine on the table and  prepped and draped in the usual sterile fashion given her existing sheath.    4000 units of heparin was then given and allowed to circulate for several minutes.  The existing 6 French sheath was remains in good position.  The infusion wires removed from the infusion catheter and hand-injection contrast is performed.  A V18 wire was then advanced through the infusion catheter.  Angiography demonstrates the significant stenosis of greater than 70% at the distal end of the stents in the below-knee portion of the popliteal.  There is also several areas within the previously stented SFA popliteal segment that are greater than 70% stenotic.  These are multiple lesions.  There is single-vessel runoff.  There is a high-grade lesion in the peroneal artery proximally as well this is greater than 80%.  Initially a 4 mm balloon is used to treat the distal lesion at the distal margin of the popliteal stent.  Inflations to 10 atm for 1 minute.  Next the worst area of in-stent restenosis is treated with the 4 mm balloon and a second inflation again 10 atm 1 minute.  The balloon was then pulled back into the native SFA and a Rosen wire is advanced under fluoroscopic guidance through the stented segment.  This ensures that the wire has not been woven through any of the interstices of the stent.  A 6 mm x 250 mm Viabahn stent was then deployed extending the distal margin of the stent approximately 10 to 15  mm further into the popliteal to cover the lesion.  The stent was then postdilated with a 5 mm x 300 mm Lutonix drug-eluting balloon.  Follow-up imaging demonstrated the newly placed Viabahn remained undersized and a 6 mm x 100 mm Lutonix drug-eluting balloon was utilized to treat the SFA and then serial inflations using the same balloon were performed throughout the entire length of the Viabahn stent fully expanded.  At this point follow-up imaging demonstrated less than 10% residual stenosis.  The peroneal was then  addressed.  A 4 mm x 80 mm Lutonix drug-eluting balloon was advanced into the proximal peroneal and inflated to just 4 atm for 1 minute.  Follow-up imaging now demonstrated the peroneal is widely patent with less than 10% residual stenosis and excellent treatment of the previously noted 80% stenosis.  To be noted during the case 2 separate infusions of nitroglycerin were performed the first was with a catheter tip in the distal popliteal and 400 mcg was administered.  The second infusion was with the catheter tip in the distal peroneal and 300 mcg was administered.  Follow-up imaging now demonstrated rapid flow of contrast from the common femoral all the way down to the foot.  At this point I elected to terminate the case.  After review of these images the sheath is pulled into the right external iliac oblique of the common femoral is obtained and a Star close device deployed. There no immediate Complications.  Findings:  Initial imaging demonstrates resolution of the previously noted thrombus.  There are now multiple lesions within the stented segment beginning in the mid SFA and extending down into the below-knee popliteal.  There is single-vessel runoff with an 80% stenosis and diffuse associated disease in the peroneal as well.  Following angioplasty and stent placement in the SFA there is now wide patency of the SFA and popliteal.  Following angioplasty in the peroneal there is now wide patency of the peroneal with rapid flow of contrast down to the foot.  Summary: Successful recanalization left lower extremity for limb salvage                           Disposition: Patient was taken to the recovery room in stable condition having tolerated the procedure well.  Earl Lites Tynisa Vohs 03/08/2023,10:00 AM

## 2023-03-08 NOTE — Consult Note (Signed)
PHARMACY - ANTICOAGULATION CONSULT NOTE  Pharmacy Consult for Heparin Indication:  ischemic limb  Patient Measurements: HDW: 74 kg  Labs: Recent Labs    03/06/23 0844 03/07/23 0047 03/07/23 0736 03/07/23 1436 03/07/23 1806 03/08/23 0028 03/08/23 0608  HGB 12.6  --  11.5*   < > 11.0* 10.5* 10.1*  HCT 37.8  --  33.2*   < > 32.2* 31.1* 29.9*  PLT 306  --  257   < > 214 192 195  HEPARINUNFRC  --    < > 0.34   < > 0.17* <0.10* 0.12*  CREATININE 0.71  --  0.67  --   --   --  0.62   < > = values in this interval not displayed.   Estimated Creatinine Clearance: 70.3 mL/min (by C-G formula based on SCr of 0.62 mg/dL).  Medical History: Past Medical History:  Diagnosis Date   Arthritis    Bronchitis    COPD (chronic obstructive pulmonary disease) (HCC)    GERD (gastroesophageal reflux disease)    Gout    Hypertension     Medications:  No AC prior to admission per chart review  Assessment: 67yo female with PMH of PAD s/p angiography (most recent June 2024), hypertension, hyperlipidemia, bilateral carotid artery disease, frequent falls, B12 deficiency, hypothyroidism, gout, COPD. Admitted to ED with left foot pain and limb ischemia. Pharmacy consulted to manage heparin infusion.  Underwent endovascular intervention 12/9 and 12/10 with vascular surgery. Started on Aggrastat 12/10 post-op to continue until 12/11 at 0700. Plan to re-start heparin infusion at completion of Aggrastat gtt.  Goal of Therapy:  Heparin level 0.3-0.7 units/ml Monitor platelets by anticoagulation protocol: Yes   12/9: HL @ 0047 = 0.35, therapeutic x 1 12/9: HL @ 0736 = 0.34, therapeutic x 2  Plan:  Will plan to re-start heparin infusion at 1000 units/hr 12/11 at 0800 w/out bolus if patient remains stable (of note patient was recently on 1200 units/hr and was therapeutic at that rate) CBC tomorrow AM  Tressie Ellis 03/08/2023 11:24 AM

## 2023-03-08 NOTE — Progress Notes (Signed)
  PROGRESS NOTE    Jackie Macias  YQM:[id number] DOB: 1955-12-06 DOA: 03/06/2023 PCP: Miki Kins, FNP  IC11A/IC11A-AA  LOS: 1 day   Brief hospital course:   Assessment & Plan: Jackie Macias is a 67 y.o. female with medical history significant of PAD s/p angiography (most recent June 2024), hypertension, hyperlipidemia, bilateral carotid artery disease, COPD, who presented to the ED due to left foot pain    * Acute critical lower limb ischemia, left Atherosclerotic occlusive disease bilateral lower extremities  Patient presenting with severe left foot pain and swelling in the absence of pulses concerning for acute limb ischemia.  Patient states she only takes Plavix at home and is compliant, no longer taking aspirin. --CT angio with findings of stent occlusion and no significant arterial supply to the left foot below the level of the ankle mortise.   Started on heparin gtt. --03/07/23 received thrombectomy with initiation of thrombolysis, today received angioplasty and stent placement to SFA with Successful recanalization left lower extremity for limb salvage. Plan: --cont Aggrastat gtt  Chronic obstructive pulmonary disease (COPD) (HCC) No shortness of breath or wheezing reported at this time.  Mixed hyperlipidemia --cont home statin  Hx of Polyarthritis, unspecified History of polyarthritis, including the right hip, right knee, with acute exacerbation of the right knee due to fall earlier this week. --PT/OT  Essential hypertension --cont home amlodipine --IV hydralazine PRN   DVT prophylaxis: BM:[id number] gtt Code Status: Full code  Family Communication:  Level of care: ICU Dispo:   The patient is from: home Anticipated d/c is to: home Anticipated d/c date is: 2-3 days.   Subjective and Interval History:  Pt underwent repeat angiogram with angioplasty and stent placement.  After the procedure, pt reported her left foot felt better.  No  dyspnea.   Objective: Vitals:   03/08/23 1400 03/08/23 1500 03/08/23 1600 03/08/23 1630  BP: (!) 157/64 (!) 155/66 (!) 186/56 (!) 141/85  Pulse: 91 93 96 (!) 110  Resp: (!) 27 (!) 22 (!) 24 (!) 23  Temp:   98.6 F (37 C)   TempSrc:      SpO2: 91% 95% (!) 87% 94%  Weight:      Height:        Intake/Output Summary (Last 24 hours) at 03/08/2023 1807 Last data filed at 03/08/2023 1700 Gross per 24 hour  Intake 743.54 ml  Output 550 ml  Net 193.54 ml   Filed Weights   03/06/23 1608 03/07/23 1653 03/08/23 0500  Weight: 77.1 kg 80.6 kg 80.1 kg    Examination:   Constitutional: NAD, AAOx3 HEENT: conjunctivae and lids normal, EOMI CV: No cyanosis.   RESP: normal respiratory effort, on 2L Extremities: left foot now warm to touch SKIN: warm, dry Neuro: II - XII grossly intact.   Psych: Normal mood and affect.  Appropriate judgement and reason   Data Reviewed: I have personally reviewed labs and imaging studies  Time spent: 35 minutes  Darlin Priestly, MD [facility] If 7PM-7AM, please contact night-coverage 03/08/2023, 6:07 PM

## 2023-03-08 NOTE — Progress Notes (Signed)
PT Cancellation Note  Patient Details Name: VIDALIA OFLYNN MRN: [id number] DOB: April 04, 1955   Cancelled Treatment:    Reason Eval/Treat Not Completed: Patient at procedure or test/unavailable (Patient off the floor for surgical procedure. PT will follow up next date or as appropriate. Discussed with ICU nurse. )  Donna Bernard, PT, MPT  Ina Homes 03/08/2023, 8:25 AM

## 2023-03-08 NOTE — Progress Notes (Signed)
OT Cancellation Note  Patient Details Name: Jackie Macias MRN: [id number] DOB: 05-24-1955   Cancelled Treatment:    Reason Eval/Treat Not Completed: Other (comment) (pt is off the floor for procecure, OT will reattempt as able.Oleta Mouse, OTD OTR/L  03/08/23, 8:28 AM

## 2023-03-09 ENCOUNTER — Encounter: Payer: Self-pay | Admitting: Vascular Surgery

## 2023-03-09 DIAGNOSIS — I998 Other disorder of circulatory system: Secondary | ICD-10-CM | POA: Diagnosis not present

## 2023-03-09 LAB — BASIC METABOLIC PANEL
Anion gap: 8 (ref 5–15)
BUN: 14 mg/dL (ref 8–23)
CO2: 24 mmol/L (ref 22–32)
Calcium: 8.2 mg/dL — ABNORMAL LOW (ref 8.9–10.3)
Chloride: 101 mmol/L (ref 98–111)
Creatinine, Ser: 0.85 mg/dL (ref 0.44–1.00)
GFR, Estimated: >60 mL/min (ref >60)
Glucose, Bld: 145 mg/dL — ABNORMAL HIGH (ref 70–99)
Potassium: 3 mmol/L — ABNORMAL LOW (ref 3.5–5.1)
Sodium: 133 mmol/L — ABNORMAL LOW (ref 135–145)

## 2023-03-09 LAB — MAGNESIUM: Magnesium: 1.7 mg/dL (ref 1.7–2.4)

## 2023-03-09 LAB — CBC
HCT: 28.8 % — ABNORMAL LOW (ref 36.0–46.0)
Hemoglobin: 9.6 g/dL — ABNORMAL LOW (ref 12.0–15.0)
MCH: 33 pg (ref 26.0–34.0)
MCHC: 33.3 g/dL (ref 30.0–36.0)
MCV: 99 fL (ref 80.0–100.0)
Platelets: 190 10*3/uL (ref 150–400)
RBC: 2.91 MIL/uL — ABNORMAL LOW (ref 3.87–5.11)
RDW: 13.7 % (ref 11.5–15.5)
WBC: 11.5 10*3/uL — ABNORMAL HIGH (ref 4.0–10.5)
nRBC: 0 % (ref 0.0–0.2)

## 2023-03-09 LAB — HEPARIN LEVEL (UNFRACTIONATED): Heparin Unfractionated: <0.1 [IU]/mL — ABNORMAL LOW (ref 0.30–0.70)

## 2023-03-09 MED ORDER — HEPARIN (PORCINE) 25000 UT/250ML-% IV SOLN
1350.0000 [IU]/h | INTRAVENOUS | Status: DC
  Administered 2023-03-09: 1000 [IU]/h via INTRAVENOUS
  Administered 2023-03-10: 1350 [IU]/h via INTRAVENOUS
  Filled 2023-03-09 (×2): qty 250

## 2023-03-09 MED ORDER — HEPARIN BOLUS VIA INFUSION
2200.0000 [IU] | Freq: Once | INTRAVENOUS | Status: AC
  Administered 2023-03-09: 2200 [IU] via INTRAVENOUS
  Filled 2023-03-09: qty 2200

## 2023-03-09 MED ORDER — CHLORHEXIDINE GLUCONATE CLOTH 2 % EX PADS
6.0000 | MEDICATED_PAD | Freq: Every day | CUTANEOUS | Status: DC
  Administered 2023-03-09 – 2023-03-14 (×5): 6 via TOPICAL

## 2023-03-09 MED ORDER — BLISTEX MEDICATED EX OINT
TOPICAL_OINTMENT | CUTANEOUS | Status: DC | PRN
  Filled 2023-03-09: qty 6.3

## 2023-03-09 MED ORDER — POTASSIUM CHLORIDE CRYS ER 20 MEQ PO TBCR
40.0000 meq | EXTENDED_RELEASE_TABLET | Freq: Once | ORAL | Status: AC
  Administered 2023-03-09: 40 meq via ORAL
  Filled 2023-03-09: qty 2

## 2023-03-09 NOTE — Progress Notes (Signed)
  Progress Note    03/09/2023 8:46 AM 1 Day Post-Op  Subjective:  Jackie Macias is a 67 yo female now POD #1 from left lower extremity angioplasty and stent placement of the left superficial femoral and popliteal arteries. Patient resting comfortably in bed. Endorses some left foot pain but its better than it was. Endorses her entire leg feels warmed. No complications overnight and vitals all remain stable.    Vitals:   03/09/23 0400 03/09/23 0500  BP: (!) 121/51 (!) 142/57  Pulse: 93 85  Resp: (!) 27 17  Temp: 98.9 F (37.2 C)   SpO2: 91% 95%   Physical Exam: Cardiac:  RRR, Normal S1, S2, No murmur Lungs:  Coarse on auscultation. Diminished in the bases. No rales or wheezing Incisions:  right groin with puncture for catheter placement. Dressing clean dry and intact. No hematoma or seroma to note.  Extremities:  Left lower extremity warm to touch, Positive doppler DP/PT pulses. Patient able to wiggle toes.  Abdomen:  Positive bowel sounds throughout, soft, non tender and non distended.  Neurologic: AAOX4, answers all questions and follows commands appropriately.   CBC    Component Value Date/Time   WBC 11.5 (H) 03/09/2023 0419   RBC 2.91 (L) 03/09/2023 0419   HGB 9.6 (L) 03/09/2023 0419   HCT 28.8 (L) 03/09/2023 0419   PLT 190 03/09/2023 0419   MCV 99.0 03/09/2023 0419   MCH 33.0 03/09/2023 0419   MCHC 33.3 03/09/2023 0419   RDW 13.7 03/09/2023 0419   LYMPHSABS 2.7 03/06/2023 0844   MONOABS 1.1 (H) 03/06/2023 0844   EOSABS 0.0 03/06/2023 0844   BASOSABS 0.0 03/06/2023 0844    BMET    Component Value Date/Time   NA 133 (L) 03/09/2023 0419   NA 138 11/22/2022 1042   K 3.0 (L) 03/09/2023 0419   CL 101 03/09/2023 0419   CO2 24 03/09/2023 0419   GLUCOSE 145 (H) 03/09/2023 0419   BUN 14 03/09/2023 0419   BUN 18 11/22/2022 1042   CREATININE 0.85 03/09/2023 0419   CALCIUM 8.2 (L) 03/09/2023 0419   GFRNONAA >60 03/09/2023 0419   GFRAA >60 09/15/2018 1742    INR     Component Value Date/Time   INR 1.04 01/05/2017 1328     Intake/Output Summary (Last 24 hours) at 03/09/2023 0846 Last data filed at 03/09/2023 0500 Gross per 24 hour  Intake 540.97 ml  Output --  Net 540.97 ml     Assessment/Plan:  67 y.o. female is s/p  left lower extremity angioplasty and stent placement of the left superficial femoral and popliteal arteries. 1 Day Post-Op   PLAN: Convert Aggrastat infusion to Heparin Infusion this morning. Pain medication PRN Ambulation with assistance.  Advance diet as tolerated.   DVT prophylaxis:  Heparin Infusion.    [name] 03/09/2023 8:46 AM

## 2023-03-09 NOTE — Evaluation (Addendum)
Occupational Therapy Evaluation Patient Details Name: Jackie Macias MRN: [id number] DOB: [birth year] Today's Date: 03/09/2023   History of Present Illness Pt is a 67 year old female admitted with Acute critical lower limb ischemia, left  Atherosclerotic occlusive disease bilateral lower extremities 03/07/23 received thrombectomy with initiation of thrombolysis, 12/10  received angioplasty and stent placement to SFA with Successful recanalization left lower extremity for limb salvage.      PMH significant for PAD s/p angiography (most recent June 2024), hypertension, hyperlipidemia, bilateral carotid artery disease, COPD,   Clinical Impression   Chart reviewed, pt greeted in bed, agreeable to OT evaluation. Co tx completed with PT on this date. Pt is a ?historian, providing conflicting information on PLOF. Pt reports she amb with no AD, is indep in ADL, and has had no falls. Will need to confirm. Pt presents with deficits in strength, endurance, activity tolerance, balance, cognition affecting safe and optimal ADL completion. MOD-MAX A +2 required for bed mobility, STS with MAX A +1-2 with RW, minimally able to clear buttocks off bed for peri care requiring TOTAL A. MAX A required for LB dressing. Frequent-step by step cueing required throughout for continued participation in task although pt reports she I motivated to improve function. Anticipate MOD-MAX A for bathing at this time.pt is left in bed, all needs met, lines/leads intact, nurse aware of status. Pt will benefit from acute OT to address deficits and to facilitate optimal ADL completion.       If plan is discharge home, recommend the following: Two people to help with walking and/or transfers;A lot of help with bathing/dressing/bathroom;Assistance with cooking/housework;Direct supervision/assist for medications management;Assist for transportation;Help with stairs or ramp for entrance;Direct supervision/assist for financial management     Functional Status Assessment  Patient has had a recent decline in their functional status and demonstrates the ability to make significant improvements in function in a reasonable and predictable amount of time.  Equipment Recommendations  Other (comment) (per next venue of care)    Recommendations for Other Services       Precautions / Restrictions        Mobility Bed Mobility Overal bed mobility: Needs Assistance Bed Mobility: Supine to Sit, Sit to Supine     Supine to sit: Mod assist, +2 for physical assistance, HOB elevated, +2 for safety/equipment Sit to supine: Mod assist, +2 for physical assistance, Max assist, +2 for safety/equipment   General bed mobility comments: increased time, frequent vcs for technique, progress transfer    Transfers Overall transfer level: Needs assistance   Transfers: Sit to/from Stand Sit to Stand: Max assist (+1-2, minimal clearance of buttocks off bed, multiple attempts)                  Balance Overall balance assessment: Needs assistance Sitting-balance support: Feet supported Sitting balance-Leahy Scale: Fair     Standing balance support: Bilateral upper extremity supported, During functional activity, Reliant on assistive device for balance Standing balance-Leahy Scale: Zero                             ADL either performed or assessed with clinical judgement   ADL Overall ADL's : Needs assistance/impaired             Lower Body Bathing: Maximal assistance   Upper Body Dressing : Moderate assistance;Bed level   Lower Body Dressing: Maximal assistance;Bed level       Toileting- Clothing Manipulation and Hygiene:  TOTAL A;Sit to/from stand         General ADL Comments: step by step verbal cues throughout     Vision Patient Visual Report: No change from baseline       Perception         Praxis         Pertinent Vitals/Pain Pain Assessment Pain Assessment: 0-10 Pain Score: 8  Pain  Location: LLE Pain Descriptors / Indicators: Discomfort Pain Intervention(s): Limited activity within patient's tolerance, Monitored during session, Repositioned     Extremity/Trunk Assessment Upper Extremity Assessment Upper Extremity Assessment: Overall WFL for tasks assessed;Generalized weakness   Lower Extremity Assessment Lower Extremity Assessment: Generalized weakness       Communication Communication Communication: Difficulty following commands/understanding Following commands: Follows one step commands with increased time Cueing Techniques: Verbal cues;Tactile cues;Visual cues   Cognition Arousal: Alert Behavior During Therapy: WFL for tasks assessed/performed Overall Cognitive Status: Impaired/Different from baseline Area of Impairment: Orientation, Attention, Memory, Following commands, Safety/judgement, Awareness                 Orientation Level: Disoriented to, Situation Current Attention Level: Sustained Memory: Decreased short-term memory, Decreased recall of precautions Following Commands: Follows one step commands with increased time Safety/Judgement: Decreased awareness of safety, Decreased awareness of deficits Awareness: Emergent   General Comments: pt providing inconsitent biographical information, will need to confirm all PLOF; pleasant and agreeable to participate but potentially self limiting at times     General Comments  vss throughout, HR up to 120s bpm sitting on edge of bed;    Exercises Other Exercises Other Exercises: edu re: role of OT, role of rehab, discharge recommendations, importance of progressing mobility   Shoulder Instructions      Home Living Family/patient expects to be discharged to:: Private residence Living Arrangements: Non-relatives/Friends Available Help at Discharge: Family Type of Home: House Home Access: Stairs to enter;Ramped entrance     Home Layout: One level     Bathroom Shower/Tub: Tub/shower  unit;Sponge bathes at baseline (sits in a regular chair at the sink)                    Prior Functioning/Environment Prior Level of Function : Patient poor historian/Family not available             Mobility Comments: pt reports amb with no AD, does not endorse falls ADLs Comments: pt reports she is indep in ADL/IADL, ?historian on this date, will need to confirm        OT Problem List: Decreased strength;Decreased activity tolerance;Decreased knowledge of use of DME or AE;Decreased safety awareness;Impaired balance (sitting and/or standing);Decreased cognition      OT Treatment/Interventions: Self-care/ADL training;Therapeutic exercise;Patient/family education;Balance training;Therapeutic activities;Energy conservation;DME and/or AE instruction;Cognitive remediation/compensation    OT Goals(Current goals can be found in the care plan section) Acute Rehab OT Goals Patient Stated Goal: go home OT Goal Formulation: With patient Time For Goal Achievement: 03/23/23 Potential to Achieve Goals: Fair ADL Goals Pt Will Perform Grooming: with modified independence;sitting Pt Will Perform Lower Body Dressing: with min assist;sitting/lateral leans Pt Will Transfer to Toilet: with min assist Pt Will Perform Toileting - Clothing Manipulation and hygiene: with min assist;sitting/lateral leans  OT Frequency: Min 1X/week    Co-evaluation PT/OT/SLP Co-Evaluation/Treatment: Yes Reason for Co-Treatment: Complexity of the patient's impairments (multi-system involvement);Necessary to address cognition/behavior during functional activity;To address functional/ADL transfers;For patient/therapist safety   OT goals addressed during session: ADL's and self-care  AM-PAC OT "6 Clicks" Daily Activity     Outcome Measure Help from another person eating meals?: A Little Help from another person taking care of personal grooming?: A Little Help from another person toileting, which includes  using toliet, bedpan, or urinal?: Total Help from another person bathing (including washing, rinsing, drying)?: A Lot Help from another person to put on and taking off regular upper body clothing?: A Little Help from another person to put on and taking off regular lower body clothing?: Total 6 Click Score: 13   End of Session Equipment Utilized During Treatment: Rolling walker (2 wheels) Nurse Communication: Mobility status  Activity Tolerance: Patient tolerated treatment well Patient left: in bed;with call bell/phone within reach  OT Visit Diagnosis: Other abnormalities of gait and mobility (R26.89);Muscle weakness (generalized) (M62.81);Unsteadiness on feet (R26.81)                Time: 2956-2130 OT Time Calculation (min): 29 min Charges:  OT General Charges $OT Visit: 1 Visit OT Evaluation $OT Eval Moderate Complexity: 1 Mod  Oleta Mouse, OTD OTR/L  03/09/23, 11:03 AM

## 2023-03-09 NOTE — Consult Note (Signed)
PHARMACY - ANTICOAGULATION CONSULT NOTE  Pharmacy Consult for Heparin Indication:  ischemic limb  Patient Measurements: HDW: 74 kg  Labs: Recent Labs    03/07/23 0736 03/07/23 1436 03/08/23 0028 03/08/23 0608 03/08/23 1126 03/09/23 0419 03/09/23 1606  HGB 11.5*   < > 10.5* 10.1* 10.3* 9.6*  --   HCT 33.2*   < > 31.1* 29.9* 29.9* 28.8*  --   PLT 257   < > 192 195 201 190  --   HEPARINUNFRC 0.34   < > <0.10* 0.12*  --   --  <0.10*  CREATININE 0.67  --   --  0.62  --  0.85  --    < > = values in this interval not displayed.   Estimated Creatinine Clearance: 66.4 mL/min (by C-G formula based on SCr of 0.85 mg/dL).  Medical History: Past Medical History:  Diagnosis Date   Arthritis    Bronchitis    COPD (chronic obstructive pulmonary disease) (HCC)    GERD (gastroesophageal reflux disease)    Gout    Hypertension     Medications:  No AC prior to admission per chart review  Assessment: 67yo female with PMH of PAD s/p angiography (most recent June 2024), hypertension, hyperlipidemia, bilateral carotid artery disease, frequent falls, B12 deficiency, hypothyroidism, gout, COPD. Admitted to ED with left foot pain and limb ischemia. Pharmacy consulted to manage heparin infusion.  Underwent endovascular intervention 12/9 and 12/10 with vascular surgery. Started on Aggrastat 12/10 post-op to continue until 12/11 at 0700. Plan to re-start heparin infusion at completion of Aggrastat gtt.  Goal of Therapy:  Heparin level 0.3-0.7 units/ml Monitor platelets by anticoagulation protocol: Yes   12/9: HL @ 0047 = 0.35, therapeutic x 1 12/9: HL @ 0736 = 0.34, therapeutic x 2 12/11 0915 Convert Aggrastat infusion to Heparin Infusion this morning.  12/11 HL < 0.10 subtherapeutic  Plan:  Will order bolus of 2200 units x 1 and increase rate to 1200 units/hr Check HL 6 hrs after rate change  CBC tomorrow AM  Bari Mantis PharmD Clinical Pharmacist 03/09/2023

## 2023-03-09 NOTE — Progress Notes (Signed)
PROGRESS NOTE    Jackie Macias  JXB:[id number] DOB: Sep 14, 1955 DOA: 03/06/2023 PCP: Miki Kins, FNP   Assessment & Plan:   Principal Problem:   Acute lower limb ischemia Active Problems:   Essential hypertension   Polyarthritis, unspecified   Mixed hyperlipidemia   Chronic obstructive pulmonary disease (COPD) (HCC)   Ischemic foot   Left foot pain  Assessment and Plan: Acute left critical lower limb ischemia: presenting with severe left foot pain and swelling in the absence of pulses concerning for acute limb ischemia. Was taking plavix at home.CT angio with findings of stent occlusion and no significant arterial supply to the left foot below the level of the ankle mortise. S/p thrombectomy with initiation of thrombolysis on 12/9 & s/p angioplasty and stent placement to SFA with Successful recanalization left lower extremity for limb salvage 12/10 as per vasc surg. Continue on IV heparin as per vasc surg. Vasc surg following and recs apprec    COPD: w/o exacerbation. Bronchodilators prn    HLD: continue on statin    Hx of polyarthritis: including the right hip, right knee, with acute exacerbation of the right knee due to fall earlier this week. PT/OT recs SNF    HTN: continue on amlodipine. IV hydralazine prn       DVT prophylaxis: IV heparin  Code Status: full  Family Communication:  Disposition Plan:likely d/c to SNF  Level of care: ICU Consultants:  Vasc surg   Procedures:   Antimicrobials:    Subjective: Pt c/o malaise   Objective: Vitals:   03/09/23 0200 03/09/23 0300 03/09/23 0400 03/09/23 0500  BP: (!) 122/54 (!) 138/49 (!) 121/51 (!) 142/57  Pulse: 93 (!) 29 93 85  Resp: 17 (!) 24 (!) 27 17  Temp:   98.9 F (37.2 C)   TempSrc:   Oral   SpO2: 93% 92% 91% 95%  Weight:    80.5 kg  Height:        Intake/Output Summary (Last 24 hours) at 03/09/2023 0829 Last data filed at 03/09/2023 0500 Gross per 24 hour  Intake 540.97 ml  Output --   Net 540.97 ml   Filed Weights   03/07/23 1653 03/08/23 0500 03/09/23 0500  Weight: 80.6 kg 80.1 kg 80.5 kg    Examination:  General exam: Appears calm and comfortable  Respiratory system: Clear to auscultation. Respiratory effort normal. Cardiovascular system: S1 & S2+. No rubs, gallops or clicks.  Gastrointestinal system: Abdomen is nondistended, soft and nontender.  Normal bowel sounds heard. Central nervous system: Alert and oriented. Moves all extremities  Psychiatry: Judgement and insight appear normal. Flat mood and affect    Data Reviewed: I have personally reviewed following labs and imaging studies  CBC: Recent Labs  Lab 03/06/23 0844 03/07/23 0736 03/07/23 1806 03/08/23 0028 03/08/23 0608 03/08/23 1126 03/09/23 0419  WBC 10.8*   < > 10.1 9.6 8.5 9.6 11.5*  NEUTROABS 6.8  --   --   --   --   --   --   HGB 12.6   < > 11.0* 10.5* 10.1* 10.3* 9.6*  HCT 37.8   < > 32.2* 31.1* 29.9* 29.9* 28.8*  MCV 100.0   < > 97.9 98.1 97.7 95.8 99.0  PLT 306   < > 214 192 195 201 190   < > = values in this interval not displayed.   Basic Metabolic Panel: Recent Labs  Lab 03/06/23 0844 03/07/23 0736 03/08/23 0608 03/09/23 0419  NA 134* 135 134*  133*  K 3.8 3.5 3.1* 3.0*  CL 100 103 100 101  CO2 23 21* 25 24  GLUCOSE 110* 123* 111* 145*  BUN 12 13 9 14   CREATININE 0.71 0.67 0.62 0.85  CALCIUM 9.1 8.9 8.5* 8.2*  MG  --   --  1.7 1.7   GFR: Estimated Creatinine Clearance: 66.4 mL/min (by C-G formula based on SCr of 0.85 mg/dL). Liver Function Tests: Recent Labs  Lab 03/06/23 0844  AST 42*  ALT 19  ALKPHOS 70  BILITOT 0.7  PROT 8.0  ALBUMIN 3.7   No results for input(s): "LIPASE", "AMYLASE" in the last 168 hours. No results for input(s): "AMMONIA" in the last 168 hours. Coagulation Profile: No results for input(s): "INR", "PROTIME" in the last 168 hours. Cardiac Enzymes: No results for input(s): "CKTOTAL", "CKMB", "CKMBINDEX", "TROPONINI" in the last 168  hours. BNP (last 3 results) No results for input(s): "PROBNP" in the last 8760 hours. HbA1C: No results for input(s): "HGBA1C" in the last 72 hours. CBG: Recent Labs  Lab 03/07/23 1531  GLUCAP 102*   Lipid Profile: No results for input(s): "CHOL", "HDL", "LDLCALC", "TRIG", "CHOLHDL", "LDLDIRECT" in the last 72 hours. Thyroid Function Tests: No results for input(s): "TSH", "T4TOTAL", "FREET4", "T3FREE", "THYROIDAB" in the last 72 hours. Anemia Panel: No results for input(s): "VITAMINB12", "FOLATE", "FERRITIN", "TIBC", "IRON", "RETICCTPCT" in the last 72 hours. Sepsis Labs: No results for input(s): "PROCALCITON", "LATICACIDVEN" in the last 168 hours.  Recent Results (from the past 240 hour(s))  MRSA Next Gen by PCR, Nasal     Status: None   Collection Time: 03/07/23  6:06 PM   Specimen: Nasal Mucosa; Nasal Swab  Result Value Ref Range Status   MRSA by PCR Next Gen NOT DETECTED NOT DETECTED Final    Comment: (NOTE) The GeneXpert MRSA Assay (FDA approved for NASAL specimens only), is one component of a comprehensive MRSA colonization surveillance program. It is not intended to diagnose MRSA infection nor to guide or monitor treatment for MRSA infections. Test performance is not FDA approved in patients less than 80 years old. Performed at [facility], [address] [place], [place] [address]          Radiology Studies: PERIPHERAL VASCULAR CATHETERIZATION  Result Date: 03/08/2023 See surgical note for result.  PERIPHERAL VASCULAR CATHETERIZATION  Result Date: 03/07/2023 See surgical note for result.       Scheduled Meds:  amLODipine  5 mg Oral Daily   Chlorhexidine Gluconate Cloth  6 each Topical Daily   gabapentin  300 mg Oral BID   pantoprazole  40 mg Oral q morning   rosuvastatin  20 mg Oral Daily   sodium chloride flush  3 mL Intravenous Q12H   sodium chloride flush  3 mL Intravenous Q12H   sodium chloride flush  3 mL Intravenous Q12H    Continuous Infusions:  sodium chloride       LOS: 2 days       Charise Killian, MD [facility] Pager [phone]  If 7PM-7AM, please contact night-coverage 03/09/2023, 8:29 AM

## 2023-03-09 NOTE — Evaluation (Signed)
Physical Therapy Evaluation Patient Details Name: Jackie Macias MRN: [id number] DOB: [birth year] Today's Date: 03/09/2023  History of Present Illness  Pt is a 67 year old female admitted with Acute critical lower limb ischemia, left  Atherosclerotic occlusive disease bilateral lower extremities 03/07/23 received thrombectomy with initiation of thrombolysis, 12/10  received angioplasty and stent placement to SFA with Successful recanalization left lower extremity for limb salvage.      PMH significant for PAD s/p angiography (most recent June 2024), hypertension, hyperlipidemia, bilateral carotid artery disease, COPD,  Clinical Impression  Patient received in bed, room dark. She has some difficulty answering questions and providing home/prior mobility information. Patient requires ,mod A +2 for bed mobility and max A for partial stand to clean bottom. She is unable to achieve full standing with RW due to pain. Patient will continue to benefit from skilled PT to improve functional mobility and independence.           If plan is discharge home, recommend the following: Two people to help with walking and/or transfers;A lot of help with bathing/dressing/bathroom   Can travel by private vehicle   No    Equipment Recommendations Rolling walker (2 wheels);Other (comment) (TBD)  Recommendations for Other Services       Functional Status Assessment Patient has had a recent decline in their functional status and demonstrates the ability to make significant improvements in function in a reasonable and predictable amount of time.     Precautions / Restrictions Precautions Precautions: Fall Restrictions Weight Bearing Restrictions: No      Mobility  Bed Mobility Overal bed mobility: Needs Assistance Bed Mobility: Supine to Sit, Sit to Supine     Supine to sit: Min assist, +2 for physical assistance, HOB elevated, Used rails Sit to supine: Mod assist, +2 for physical assistance, +2 for  safety/equipment, Used rails   General bed mobility comments: increased time, frequent vcs for technique, progress    Transfers Overall transfer level: Needs assistance Equipment used: Rolling walker (2 wheels) Transfers: Sit to/from Stand Sit to Stand: Max assist           General transfer comment: unable to get fully standing. Decreased initiation and effort due to pain    Ambulation/Gait               General Gait Details: unable  Stairs            Wheelchair Mobility     Tilt Bed    Modified Rankin (Stroke Patients Only)       Balance Overall balance assessment: Needs assistance, History of Falls Sitting-balance support: Feet supported Sitting balance-Leahy Scale: Fair     Standing balance support: Bilateral upper extremity supported, During functional activity, Reliant on assistive device for balance Standing balance-Leahy Scale: Zero                               Pertinent Vitals/Pain Pain Assessment Pain Assessment: Faces Faces Pain Scale: Hurts even more Pain Location: LLE Pain Descriptors / Indicators: Discomfort, Grimacing, Sore Pain Intervention(s): Monitored during session, Repositioned    Home Living Family/patient expects to be discharged to:: Private residence Living Arrangements: Spouse/significant other Available Help at Discharge: Friend(s) Type of Home: House Home Access: Stairs to enter;Ramped entrance       Home Layout: One level   Additional Comments: Unsure    Prior Function Prior Level of Function : Patient poor historian/Family not available  Mobility Comments: pt reports amb with no AD, does not endorse falls- (chart states frequent falls) ADLs Comments: pt reports she is indep in ADL/IADL, ?historian on this date, will need to confirm     Extremity/Trunk Assessment   Upper Extremity Assessment Upper Extremity Assessment: Defer to OT evaluation    Lower Extremity  Assessment Lower Extremity Assessment: LLE deficits/detail LLE: Unable to fully assess due to pain LLE Coordination: decreased gross motor    Cervical / Trunk Assessment Cervical / Trunk Assessment: Normal  Communication   Communication Communication: Difficulty following commands/understanding;Difficulty communicating thoughts/reduced clarity of speech Following commands: Follows one step commands with increased time Cueing Techniques: Verbal cues;Tactile cues  Cognition Arousal: Alert Behavior During Therapy: WFL for tasks assessed/performed Overall Cognitive Status: Difficult to assess Area of Impairment: Orientation, Attention, Memory, Following commands, Safety/judgement, Awareness                       Following Commands: Follows one step commands with increased time Safety/Judgement: Decreased awareness of safety, Decreased awareness of deficits     General Comments: pt providing inconsitent biographical information, will need to confirm all PLOF; pleasant and agreeable to participate but potentially self limiting at times        General Comments General comments (skin integrity, edema, etc.): vss throughout, HR up to 120s bpm sitting on edge of bed;    Exercises     Assessment/Plan    PT Assessment Patient needs continued PT services  PT Problem List Decreased strength;Decreased mobility;Decreased activity tolerance;Decreased balance;Decreased safety awareness;Decreased cognition;Decreased knowledge of use of DME;Pain       PT Treatment Interventions DME instruction;Gait training;Functional mobility training;Therapeutic activities;Therapeutic exercise;Balance training;Cognitive remediation;Patient/family education    PT Goals (Current goals can be found in the Care Plan section)  Acute Rehab PT Goals Patient Stated Goal: to return home PT Goal Formulation: With patient Time For Goal Achievement: 03/23/23 Potential to Achieve Goals: Fair    Frequency  Min 1X/week     Co-evaluation PT/OT/SLP Co-Evaluation/Treatment: Yes Reason for Co-Treatment: Complexity of the patient's impairments (multi-system involvement);Necessary to address cognition/behavior during functional activity;To address functional/ADL transfers;For patient/therapist safety PT goals addressed during session: Mobility/safety with mobility;Balance;Proper use of DME OT goals addressed during session: ADL's and self-care       AM-PAC PT "6 Clicks" Mobility  Outcome Measure Help needed turning from your back to your side while in a flat bed without using bedrails?: A Lot Help needed moving from lying on your back to sitting on the side of a flat bed without using bedrails?: A Lot Help needed moving to and from a bed to a chair (including a wheelchair)?: Total Help needed standing up from a chair using your arms (e.g., wheelchair or bedside chair)?: Total Help needed to walk in hospital room?: Total Help needed climbing 3-5 steps with a railing? : Total 6 Click Score: 8    End of Session   Activity Tolerance: Patient limited by pain Patient left: in bed;with call bell/phone within reach Nurse Communication: Mobility status PT Visit Diagnosis: Other abnormalities of gait and mobility (R26.89);Pain;Muscle weakness (generalized) (M62.81);Difficulty in walking, not elsewhere classified (R26.2) Pain - Right/Left: Left Pain - part of body: Leg    Time: 2956-2130 PT Time Calculation (min) (ACUTE ONLY): 28 min   Charges:   PT Evaluation $PT Eval Moderate Complexity: 1 Mod   PT General Charges $$ ACUTE PT VISIT: 1 Visit         Lissa Merlin,  PT, GCS 03/09/23,11:11 AM

## 2023-03-10 ENCOUNTER — Inpatient Hospital Stay: Payer: Medicare HMO

## 2023-03-10 DIAGNOSIS — I998 Other disorder of circulatory system: Secondary | ICD-10-CM | POA: Diagnosis not present

## 2023-03-10 LAB — URINALYSIS, W/ REFLEX TO CULTURE (INFECTION SUSPECTED)
Bilirubin Urine: NEGATIVE
Glucose, UA: NEGATIVE mg/dL
Ketones, ur: NEGATIVE mg/dL
Leukocytes,Ua: NEGATIVE
Nitrite: NEGATIVE
Protein, ur: 30 mg/dL — AB
Specific Gravity, Urine: 1.026 (ref 1.005–1.030)
pH: 5 (ref 5.0–8.0)

## 2023-03-10 LAB — BASIC METABOLIC PANEL
Anion gap: 7 (ref 5–15)
BUN: 13 mg/dL (ref 8–23)
CO2: 26 mmol/L (ref 22–32)
Calcium: 8.1 mg/dL — ABNORMAL LOW (ref 8.9–10.3)
Chloride: 99 mmol/L (ref 98–111)
Creatinine, Ser: 0.85 mg/dL (ref 0.44–1.00)
GFR, Estimated: >60 mL/min (ref >60)
Glucose, Bld: 150 mg/dL — ABNORMAL HIGH (ref 70–99)
Potassium: 3.4 mmol/L — ABNORMAL LOW (ref 3.5–5.1)
Sodium: 132 mmol/L — ABNORMAL LOW (ref 135–145)

## 2023-03-10 LAB — CBC
HCT: 29.3 % — ABNORMAL LOW (ref 36.0–46.0)
Hemoglobin: 9.8 g/dL — ABNORMAL LOW (ref 12.0–15.0)
MCH: 32.9 pg (ref 26.0–34.0)
MCHC: 33.4 g/dL (ref 30.0–36.0)
MCV: 98.3 fL (ref 80.0–100.0)
Platelets: 195 10*3/uL (ref 150–400)
RBC: 2.98 MIL/uL — ABNORMAL LOW (ref 3.87–5.11)
RDW: 13.7 % (ref 11.5–15.5)
WBC: 13.1 10*3/uL — ABNORMAL HIGH (ref 4.0–10.5)
nRBC: 0 % (ref 0.0–0.2)

## 2023-03-10 LAB — HEPARIN LEVEL (UNFRACTIONATED)
Heparin Unfractionated: 0.22 [IU]/mL — ABNORMAL LOW (ref 0.30–0.70)
Heparin Unfractionated: 0.18 [IU]/mL — ABNORMAL LOW (ref 0.30–0.70)

## 2023-03-10 LAB — MAGNESIUM: Magnesium: 2 mg/dL (ref 1.7–2.4)

## 2023-03-10 MED ORDER — HEPARIN BOLUS VIA INFUSION
1100.0000 [IU] | Freq: Once | INTRAVENOUS | Status: AC
  Administered 2023-03-10: 1100 [IU] via INTRAVENOUS
  Filled 2023-03-10: qty 1100

## 2023-03-10 MED ORDER — CLOPIDOGREL BISULFATE 75 MG PO TABS
75.0000 mg | ORAL_TABLET | Freq: Every day | ORAL | Status: DC
  Administered 2023-03-10 – 2023-03-14 (×5): 75 mg via ORAL
  Filled 2023-03-10 (×5): qty 1

## 2023-03-10 MED ORDER — ENOXAPARIN SODIUM 40 MG/0.4ML IJ SOSY
40.0000 mg | PREFILLED_SYRINGE | Freq: Every day | INTRAMUSCULAR | Status: DC
  Administered 2023-03-10 – 2023-03-13 (×4): 40 mg via SUBCUTANEOUS
  Filled 2023-03-10 (×4): qty 0.4

## 2023-03-10 MED ORDER — ASPIRIN 81 MG PO TBEC
81.0000 mg | DELAYED_RELEASE_TABLET | Freq: Every day | ORAL | Status: DC
  Administered 2023-03-10 – 2023-03-14 (×5): 81 mg via ORAL
  Filled 2023-03-10 (×5): qty 1

## 2023-03-10 MED ORDER — ENOXAPARIN SODIUM 40 MG/0.4ML IJ SOSY: 40.0000 mg | PREFILLED_SYRINGE | Freq: Every day | INTRAMUSCULAR | Status: DC

## 2023-03-10 NOTE — Consult Note (Signed)
PHARMACY - ANTICOAGULATION CONSULT NOTE  Pharmacy Consult for Heparin Indication:  ischemic limb  Patient Measurements: HDW: 74 kg  Labs: Recent Labs    03/07/23 0736 03/07/23 1436 03/08/23 0608 03/08/23 1126 03/09/23 0419 03/09/23 1606 03/10/23 0059  HGB 11.5*   < > 10.1* 10.3* 9.6*  --   --   HCT 33.2*   < > 29.9* 29.9* 28.8*  --   --   PLT 257   < > 195 201 190  --   --   HEPARINUNFRC 0.34   < > 0.12*  --   --  <0.10* 0.22*  CREATININE 0.67  --  0.62  --  0.85  --   --    < > = values in this interval not displayed.   Estimated Creatinine Clearance: 66.4 mL/min (by C-G formula based on SCr of 0.85 mg/dL).  Medical History: Past Medical History:  Diagnosis Date   Arthritis    Bronchitis    COPD (chronic obstructive pulmonary disease) (HCC)    GERD (gastroesophageal reflux disease)    Gout    Hypertension     Medications:  No AC prior to admission per chart review  Assessment: 67yo female with PMH of PAD s/p angiography (most recent June 2024), hypertension, hyperlipidemia, bilateral carotid artery disease, frequent falls, B12 deficiency, hypothyroidism, gout, COPD. Admitted to ED with left foot pain and limb ischemia. Pharmacy consulted to manage heparin infusion.  Underwent endovascular intervention 12/9 and 12/10 with vascular surgery. Started on Aggrastat 12/10 post-op to continue until 12/11 at 0700. Plan to re-start heparin infusion at completion of Aggrastat gtt.  Goal of Therapy:  Heparin level 0.3-0.7 units/ml Monitor platelets by anticoagulation protocol: Yes   12/9: HL @ 0047 = 0.35, therapeutic x 1 12/9: HL @ 0736 = 0.34, therapeutic x 2 12/11 0915 Convert Aggrastat infusion to Heparin Infusion this morning.  12/11 HL < 0.10 subtherapeutic 12/12 HL 0059 subtherapeutic  Plan:  Will order bolus of 1100 units x 1 Increase rate to 1350 units/hr Recheck HL 6 hrs after rate change  CBC tomorrow AM  Otelia Sergeant, PharmD, [facility] 03/10/2023 3:10  AM

## 2023-03-10 NOTE — TOC Initial Note (Signed)
Transition of Care [facility]) - Initial/Assessment Note    Patient Details  Name: Jackie Macias MRN: [id number] Date of Birth: [birth year]  [facility] CM/SW Contact:    Truddie Hidden, RN Phone Number: 03/10/2023, 2:15 PM  Clinical Narrative:                 Spoke with patient regarding therapy's recommendation for SNF. Patient stated she son assists her with medical decisions. She would like to discuss it with him first.          Patient Goals and CMS Choice            Expected Discharge Plan and Services                                              Prior Living Arrangements/Services                       Activities of Daily Living   ADL Screening (condition at time of admission) Independently performs ADLs?: Yes (appropriate for developmental age) Is the patient deaf or have difficulty hearing?: No Does the patient have difficulty seeing, even when wearing glasses/contacts?: No Does the patient have difficulty concentrating, remembering, or making decisions?: No  Permission Sought/Granted                  Emotional Assessment              Admission diagnosis:  Left foot pain [M79.672] Acute lower limb ischemia [I99.8] Ischemic foot [I99.8] Patient Active Problem List   Diagnosis Date Noted   Ischemic foot 03/07/2023   Left foot pain 03/07/2023   Acute lower limb ischemia 03/06/2023   Prediabetes 11/22/2022   Vitamin D deficiency, unspecified 11/22/2022   Atherosclerosis of native arteries of the extremities with ulceration (HCC) 08/12/2022   Claustrophobia 05/19/2021   Low back pain 10/21/2020   Atherosclerosis of native arteries of extremity with intermittent claudication (HCC) 07/28/2020   Enthesopathy of hip region 06/06/2020   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   Chronic obstructive pulmonary disease (COPD) (HCC) 01/17/2020   Parotid mass 10/05/2019   Carotid stenosis 10/23/2018   GERD (gastroesophageal  reflux disease) 10/23/2018   Intracranial vascular stenosis 10/23/2018   Mixed hyperlipidemia 05/26/2018   Gout 03/20/2018   Essential hypertension 09/08/2017   Peripheral vascular disease (HCC) 09/08/2017   Status post THR (total hip replacement) 01/17/2017   Primary osteoarthritis of right hip 01/05/2017   Gastro-esophageal reflux disease with esophagitis, without bleeding 02/28/2012   Helicobacter pylori infection 08/17/2010   Iron deficiency anemia 08/17/2010   Polyarthritis, unspecified 08/17/2010   Tobacco use disorder 08/17/2010   History of colonic polyps 07/02/2010   Internal hemorrhoids 07/02/2010   PCP:  Miki Kins, FNP Pharmacy:   [facility] [address]), [place] - [address] [place]) [place] [address] Phone: [phone] Fax: [phone]     Social Drivers of Health (SDOH) Social History: SDOH Screenings   Food Insecurity: No Food Insecurity (03/07/2023)  Housing: Low Risk  (03/07/2023)  Transportation Needs: No Transportation Needs (03/07/2023)  Utilities: Not At Risk (03/07/2023)  Tobacco Use: High Risk (11/30/2022)   Received from [facility]   SDOH Interventions:     Readmission Risk Interventions     No data to display

## 2023-03-10 NOTE — Progress Notes (Signed)
PROGRESS NOTE    Jackie Macias  JXB:[id number] DOB: [birth year] DOA: 03/06/2023 PCP: Miki Kins, FNP   Assessment & Plan:   Principal Problem:   Acute lower limb ischemia Active Problems:   Essential hypertension   Polyarthritis, unspecified   Mixed hyperlipidemia   Chronic obstructive pulmonary disease (COPD) (HCC)   Ischemic foot   Left foot pain  Assessment and Plan: Acute left critical lower limb ischemia: presenting with severe left foot pain and swelling in the absence of pulses concerning for acute limb ischemia. Was taking plavix at home. CT angio with findings of stent occlusion and no significant arterial supply to the left foot below the level of the ankle mortise. S/p thrombectomy with initiation of thrombolysis on 12/9 & s/p angioplasty and stent placement to SFA with successful recanalization left lower extremity for limb salvage 12/10 as per vasc surg. D/c IV heparin and start aspirin, plavix as per vasc surg. Vasc surg following and recs apprec   Leukocytosis: trending and spiked a fever. Blood cxs ordered. UA w/ reflex to cx ordered. Etiology unclear    COPD: w/o exacerbation. Bronchodilators prn    HLD: continue on statin     Hx of polyarthritis: including the right hip, right knee, with acute exacerbation of the right knee due to fall earlier this week. PT/OT recs SNF    HTN: continue on amlodipine. IV hydralazine prn       DVT prophylaxis: lovenox  Code Status: full  Family Communication:  Disposition Plan:likely d/c to SNF  Level of care: Med-Surg Consultants:  Vasc surg   Procedures:   Antimicrobials:    Subjective: Pt c/o fatigue   Objective: Vitals:   03/10/23 1100 03/10/23 1200 03/10/23 1308 03/10/23 1310  BP: 118/74 104/64  (!) 136/55  Pulse: 73   83  Resp: 19 16 18 16   Temp:  99.3 F (37.4 C) 100.1 F (37.8 C) 100.1 F (37.8 C)  TempSrc:  Oral Oral   SpO2: 100% 93%  97%  Weight:      Height:        Intake/Output  Summary (Last 24 hours) at 03/10/2023 1340 Last data filed at 03/10/2023 1142 Gross per 24 hour  Intake 812.07 ml  Output 200 ml  Net 612.07 ml   Filed Weights   03/08/23 0500 03/09/23 0500 03/10/23 0500  Weight: 80.1 kg 80.5 kg 78.5 kg    Examination:  General exam: appears comfortable  Respiratory system: clear breath sounds b/l  Cardiovascular system: S1/S2+. No rubs or clicks  Gastrointestinal system: Abd is soft, NT, ND & hypoactive bowel sounds  Central nervous system: alert & oriented. Moves all extremities Psychiatry: Judgement and insight appears at baseline. Flat mood and affect    Data Reviewed: I have personally reviewed following labs and imaging studies  CBC: Recent Labs  Lab 03/06/23 0844 03/07/23 0736 03/08/23 0028 03/08/23 0608 03/08/23 1126 03/09/23 0419 03/10/23 1036  WBC 10.8*   < > 9.6 8.5 9.6 11.5* 13.1*  NEUTROABS 6.8  --   --   --   --   --   --   HGB 12.6   < > 10.5* 10.1* 10.3* 9.6* 9.8*  HCT 37.8   < > 31.1* 29.9* 29.9* 28.8* 29.3*  MCV 100.0   < > 98.1 97.7 95.8 99.0 98.3  PLT 306   < > 192 195 201 190 195   < > = values in this interval not displayed.   Basic Metabolic Panel: Recent  Labs  Lab 03/06/23 0844 03/07/23 0736 03/08/23 0608 03/09/23 0419 03/10/23 1036  NA 134* 135 134* 133* 132*  K 3.8 3.5 3.1* 3.0* 3.4*  CL 100 103 100 101 99  CO2 23 21* 25 24 26   GLUCOSE 110* 123* 111* 145* 150*  BUN 12 13 9 14 13   CREATININE 0.71 0.67 0.62 0.85 0.85  CALCIUM 9.1 8.9 8.5* 8.2* 8.1*  MG  --   --  1.7 1.7 2.0   GFR: Estimated Creatinine Clearance: 65.6 mL/min (by C-G formula based on SCr of 0.85 mg/dL). Liver Function Tests: Recent Labs  Lab 03/06/23 0844  AST 42*  ALT 19  ALKPHOS 70  BILITOT 0.7  PROT 8.0  ALBUMIN 3.7   No results for input(s): "LIPASE", "AMYLASE" in the last 168 hours. No results for input(s): "AMMONIA" in the last 168 hours. Coagulation Profile: No results for input(s): "INR", "PROTIME" in the last  168 hours. Cardiac Enzymes: No results for input(s): "CKTOTAL", "CKMB", "CKMBINDEX", "TROPONINI" in the last 168 hours. BNP (last 3 results) No results for input(s): "PROBNP" in the last 8760 hours. HbA1C: No results for input(s): "HGBA1C" in the last 72 hours. CBG: Recent Labs  Lab 03/07/23 1531  GLUCAP 102*   Lipid Profile: No results for input(s): "CHOL", "HDL", "LDLCALC", "TRIG", "CHOLHDL", "LDLDIRECT" in the last 72 hours. Thyroid Function Tests: No results for input(s): "TSH", "T4TOTAL", "FREET4", "T3FREE", "THYROIDAB" in the last 72 hours. Anemia Panel: No results for input(s): "VITAMINB12", "FOLATE", "FERRITIN", "TIBC", "IRON", "RETICCTPCT" in the last 72 hours. Sepsis Labs: No results for input(s): "PROCALCITON", "LATICACIDVEN" in the last 168 hours.  Recent Results (from the past 240 hours)  MRSA Next Gen by PCR, Nasal     Status: None   Collection Time: 03/07/23  6:06 PM   Specimen: Nasal Mucosa; Nasal Swab  Result Value Ref Range Status   MRSA by PCR Next Gen NOT DETECTED NOT DETECTED Final    Comment: (NOTE) The GeneXpert MRSA Assay (FDA approved for NASAL specimens only), is one component of a comprehensive MRSA colonization surveillance program. It is not intended to diagnose MRSA infection nor to guide or monitor treatment for MRSA infections. Test performance is not FDA approved in patients less than 50 years old. Performed at [facility], [address], [place], [place] [address]          Radiology Studies: No results found.      Scheduled Meds:  amLODipine  5 mg Oral Daily   aspirin EC  81 mg Oral Daily   Chlorhexidine Gluconate Cloth  6 each Topical Daily   clopidogrel  75 mg Oral Daily   enoxaparin (LOVENOX) injection  40 mg Subcutaneous QHS   gabapentin  300 mg Oral BID   pantoprazole  40 mg Oral q morning   rosuvastatin  20 mg Oral Daily   sodium chloride flush  3 mL Intravenous Q12H   sodium chloride flush  3 mL  Intravenous Q12H   sodium chloride flush  3 mL Intravenous Q12H   Continuous Infusions:     LOS: 3 days       Charise Killian, MD [facility] Pager [phone]  If 7PM-7AM, please contact night-coverage 03/10/2023, 1:40 PM

## 2023-03-10 NOTE — Progress Notes (Signed)
  Progress Note    03/10/2023 8:09 AM 2 Days Post-Op  Subjective:   Jackie Macias is a 67 yo female now POD #2 from left lower extremity angioplasty and stent placement of the left superficial femoral and popliteal arteries. Patient resting comfortably in bed. Endorses some left foot pain but its better than it was. Endorses her entire leg feels warmer. No complications overnight and vitals all remain stable    Vitals:   03/10/23 0500 03/10/23 0600  BP: (!) 131/59 (!) 114/50  Pulse:  76  Resp: 16 13  Temp:    SpO2:  93%   Physical Exam: Cardiac:  RRR, Normal S1, S2, No murmur Lungs:  Coarse on auscultation. Diminished in the bases. No rales or wheezing Incisions:  right groin with puncture for catheter placement. Dressing clean dry and intact. No hematoma or seroma to note.  Extremities:  Left lower extremity warm to touch, Positive doppler DP/PT pulses. Patient able to wiggle toes.  Abdomen:  Positive bowel sounds throughout, soft, non tender and non distended.  Neurologic: AAOX4, answers all questions and follows commands appropriately.  CBC    Component Value Date/Time   WBC 11.5 (H) 03/09/2023 0419   RBC 2.91 (L) 03/09/2023 0419   HGB 9.6 (L) 03/09/2023 0419   HCT 28.8 (L) 03/09/2023 0419   PLT 190 03/09/2023 0419   MCV 99.0 03/09/2023 0419   MCH 33.0 03/09/2023 0419   MCHC 33.3 03/09/2023 0419   RDW 13.7 03/09/2023 0419   LYMPHSABS 2.7 03/06/2023 0844   MONOABS 1.1 (H) 03/06/2023 0844   EOSABS 0.0 03/06/2023 0844   BASOSABS 0.0 03/06/2023 0844    BMET    Component Value Date/Time   NA 133 (L) 03/09/2023 0419   NA 138 11/22/2022 1042   K 3.0 (L) 03/09/2023 0419   CL 101 03/09/2023 0419   CO2 24 03/09/2023 0419   GLUCOSE 145 (H) 03/09/2023 0419   BUN 14 03/09/2023 0419   BUN 18 11/22/2022 1042   CREATININE 0.85 03/09/2023 0419   CALCIUM 8.2 (L) 03/09/2023 0419   GFRNONAA >60 03/09/2023 0419   GFRAA >60 09/15/2018 1742    INR    Component Value  Date/Time   INR 1.04 01/05/2017 1328     Intake/Output Summary (Last 24 hours) at 03/10/2023 0809 Last data filed at 03/10/2023 0400 Gross per 24 hour  Intake 734.4 ml  Output 200 ml  Net 534.4 ml     Assessment/Plan:  67 y.o. female is s/p  left lower extremity angioplasty and stent placement of the left superficial femoral and popliteal arteries.  2 Days Post-Op   PLAN: Discontinue Heparin today Start ASA 81 mg daily Plavix 75 mg daily and Crestor 20 mg Daily.  Pain medication PRN Ambulation with assistance.  Advance diet as tolerated.    DVT prophylaxis:  Heparin Infusion.   [name] 03/10/2023 8:09 AM

## 2023-03-10 NOTE — Plan of Care (Signed)
  Problem: Education: Goal: Knowledge of General Education information will improve Description: Including pain rating scale, medication(s)/side effects and non-pharmacologic comfort measures Outcome: Progressing   Problem: Education: Goal: Knowledge of General Education information will improve Description: Including pain rating scale, medication(s)/side effects and non-pharmacologic comfort measures Outcome: Progressing   Problem: Health Behavior/Discharge Planning: Goal: Ability to manage health-related needs will improve Outcome: Progressing   Problem: Clinical Measurements: Goal: Ability to maintain clinical measurements within normal limits will improve Outcome: Progressing Goal: Will remain free from infection Outcome: Progressing Goal: Diagnostic test results will improve Outcome: Progressing Goal: Respiratory complications will improve Outcome: Progressing Goal: Cardiovascular complication will be avoided Outcome: Progressing   Problem: Activity: Goal: Risk for activity intolerance will decrease Outcome: Progressing   Problem: Nutrition: Goal: Adequate nutrition will be maintained Outcome: Progressing   Problem: Coping: Goal: Level of anxiety will decrease Outcome: Progressing   Problem: Elimination: Goal: Will not experience complications related to bowel motility Outcome: Progressing Goal: Will not experience complications related to urinary retention Outcome: Progressing   Problem: Pain Management: Goal: General experience of comfort will improve Outcome: Progressing   Problem: Safety: Goal: Ability to remain free from injury will improve Outcome: Progressing   Problem: Skin Integrity: Goal: Risk for impaired skin integrity will decrease Outcome: Progressing   Problem: Education: Goal: Understanding of CV disease, CV risk reduction, and recovery process will improve Outcome: Progressing Goal: Individualized Educational Video(s) Outcome:  Progressing   Problem: Activity: Goal: Ability to return to baseline activity level will improve Outcome: Progressing   Problem: Cardiovascular: Goal: Ability to achieve and maintain adequate cardiovascular perfusion will improve Outcome: Progressing Goal: Vascular access site(s) Level 0-1 will be maintained Outcome: Progressing   Problem: Health Behavior/Discharge Planning: Goal: Ability to safely manage health-related needs after discharge will improve Outcome: Progressing

## 2023-03-11 DIAGNOSIS — I998 Other disorder of circulatory system: Secondary | ICD-10-CM | POA: Diagnosis not present

## 2023-03-11 LAB — MAGNESIUM: Magnesium: 1.9 mg/dL (ref 1.7–2.4)

## 2023-03-11 LAB — BASIC METABOLIC PANEL
Anion gap: 6 (ref 5–15)
BUN: 13 mg/dL (ref 8–23)
CO2: 25 mmol/L (ref 22–32)
Calcium: 8.1 mg/dL — ABNORMAL LOW (ref 8.9–10.3)
Chloride: 101 mmol/L (ref 98–111)
Creatinine, Ser: 0.81 mg/dL (ref 0.44–1.00)
GFR, Estimated: >60 mL/min (ref >60)
Glucose, Bld: 130 mg/dL — ABNORMAL HIGH (ref 70–99)
Potassium: 3.5 mmol/L (ref 3.5–5.1)
Sodium: 132 mmol/L — ABNORMAL LOW (ref 135–145)

## 2023-03-11 LAB — PROCALCITONIN: Procalcitonin: 0.5 ng/mL

## 2023-03-11 LAB — CBC
HCT: 24 % — ABNORMAL LOW (ref 36.0–46.0)
Hemoglobin: 8.3 g/dL — ABNORMAL LOW (ref 12.0–15.0)
MCH: 33.1 pg (ref 26.0–34.0)
MCHC: 34.6 g/dL (ref 30.0–36.0)
MCV: 95.6 fL (ref 80.0–100.0)
Platelets: 200 10*3/uL (ref 150–400)
RBC: 2.51 MIL/uL — ABNORMAL LOW (ref 3.87–5.11)
RDW: 13.8 % (ref 11.5–15.5)
WBC: 16.3 10*3/uL — ABNORMAL HIGH (ref 4.0–10.5)
nRBC: 0 % (ref 0.0–0.2)

## 2023-03-11 MED ORDER — SODIUM CHLORIDE 0.9 % IV SOLN
500.0000 mg | INTRAVENOUS | Status: DC
Start: 2023-03-11 — End: 2023-03-13
  Administered 2023-03-11 – 2023-03-13 (×3): 500 mg via INTRAVENOUS
  Filled 2023-03-11 (×3): qty 5

## 2023-03-11 MED ORDER — SODIUM CHLORIDE 0.9 % IV SOLN
1.0000 g | INTRAVENOUS | Status: DC
Start: 2023-03-11 — End: 2023-03-13
  Administered 2023-03-11 – 2023-03-12 (×2): 1 g via INTRAVENOUS
  Filled 2023-03-11 (×3): qty 10

## 2023-03-11 NOTE — Progress Notes (Signed)
PROGRESS NOTE    Jackie Macias  BJY:[id number] DOB: 02/04/1956 DOA: 03/06/2023 PCP: Miki Kins, FNP   Assessment & Plan:   Principal Problem:   Acute lower limb ischemia Active Problems:   Essential hypertension   Polyarthritis, unspecified   Mixed hyperlipidemia   Chronic obstructive pulmonary disease (COPD) (HCC)   Ischemic foot   Left foot pain  Assessment and Plan: Acute left critical lower limb ischemia: presenting with severe left foot pain and swelling in the absence of pulses concerning for acute limb ischemia. Was taking plavix at home. CT angio with findings of stent occlusion and no significant arterial supply to the left foot below the level of the ankle mortise. S/p thrombectomy with initiation of thrombolysis on 12/9 & s/p angioplasty and stent placement to SFA with successful recanalization left lower extremity for limb salvage 12/10 as per vasc surg. D/c IV heparin. Continue on aspirin, plavix as per vasc surg. Vasc surg following and recs apprec   Leukocytosis: trending up & still spiking fevers. Blood cxs NGTD. UA is unremarkable. Likely secondary to pneumonia as seen on CXR  Likely pneumonia: as seen on CXR & w/ increasing WBCs, cough & spiking fevers, will start IV abxs. Started on IV rocephin, azithromycin and continue on bronchodilators. Encourage incentive spirometry    COPD: w/o exacerbation. Bronchodilators prn    HLD: continue on statin    Hx of polyarthritis: including the right hip, right knee, with acute exacerbation of the right knee due to fall earlier this week. PT/OT recs SNF. Pt refuses SNF but agrees to [facility]    HTN: continue on amlodipine. IV hydralazine prn       DVT prophylaxis: lovenox  Code Status: full  Family Communication:  Disposition Plan:likely d/c to SNF  Level of care: Med-Surg Consultants:  Vasc surg   Procedures:   Antimicrobials:    Subjective: Pt c/o intermittent cough.   Objective: Vitals:   03/10/23  1552 03/10/23 1600 03/10/23 2141 03/11/23 0625  BP: 132/64  138/60 (!) 121/54  Pulse: 84 72 79 76  Resp: 18  16 18   Temp: 99.7 F (37.6 C)  (!) 100.8 F (38.2 C) 99.9 F (37.7 C)  TempSrc: Oral  Oral   SpO2: (!) 89% 100% 96% 97%  Weight:      Height:        Intake/Output Summary (Last 24 hours) at 03/11/2023 0813 Last data filed at 03/11/2023 0400 Gross per 24 hour  Intake 572.07 ml  Output 200 ml  Net 372.07 ml   Filed Weights   03/08/23 0500 03/09/23 0500 03/10/23 0500  Weight: 80.1 kg 80.5 kg 78.5 kg    Examination:  General exam: Appears comfortable   Respiratory system: diminished breath sounds b/l  Cardiovascular system:  S1 & S2+. No rubs or clicks  Gastrointestinal system: abd is soft, NT, ND & normal bowel sounds  Central nervous system: alert & oriented. Moves all extremities  Psychiatry: Judgement and insight appears normal. Appropriate mood and affect    Data Reviewed: I have personally reviewed following labs and imaging studies  CBC: Recent Labs  Lab 03/06/23 0844 03/07/23 0736 03/08/23 0028 03/08/23 0608 03/08/23 1126 03/09/23 0419 03/10/23 1036  WBC 10.8*   < > 9.6 8.5 9.6 11.5* 13.1*  NEUTROABS 6.8  --   --   --   --   --   --   HGB 12.6   < > 10.5* 10.1* 10.3* 9.6* 9.8*  HCT 37.8   < >  31.1* 29.9* 29.9* 28.8* 29.3*  MCV 100.0   < > 98.1 97.7 95.8 99.0 98.3  PLT 306   < > 192 195 201 190 195   < > = values in this interval not displayed.   Basic Metabolic Panel: Recent Labs  Lab 03/07/23 0736 03/08/23 0608 03/09/23 0419 03/10/23 1036 03/11/23 0555  NA 135 134* 133* 132* 132*  K 3.5 3.1* 3.0* 3.4* 3.5  CL 103 100 101 99 101  CO2 21* 25 24 26 25   GLUCOSE 123* 111* 145* 150* 130*  BUN 13 9 14 13 13   CREATININE 0.67 0.62 0.85 0.85 0.81  CALCIUM 8.9 8.5* 8.2* 8.1* 8.1*  MG  --  1.7 1.7 2.0 1.9   GFR: Estimated Creatinine Clearance: 68.8 mL/min (by C-G formula based on SCr of 0.81 mg/dL). Liver Function Tests: Recent Labs  Lab  03/06/23 0844  AST 42*  ALT 19  ALKPHOS 70  BILITOT 0.7  PROT 8.0  ALBUMIN 3.7   No results for input(s): "LIPASE", "AMYLASE" in the last 168 hours. No results for input(s): "AMMONIA" in the last 168 hours. Coagulation Profile: No results for input(s): "INR", "PROTIME" in the last 168 hours. Cardiac Enzymes: No results for input(s): "CKTOTAL", "CKMB", "CKMBINDEX", "TROPONINI" in the last 168 hours. BNP (last 3 results) No results for input(s): "PROBNP" in the last 8760 hours. HbA1C: No results for input(s): "HGBA1C" in the last 72 hours. CBG: Recent Labs  Lab 03/07/23 1531  GLUCAP 102*   Lipid Profile: No results for input(s): "CHOL", "HDL", "LDLCALC", "TRIG", "CHOLHDL", "LDLDIRECT" in the last 72 hours. Thyroid Function Tests: No results for input(s): "TSH", "T4TOTAL", "FREET4", "T3FREE", "THYROIDAB" in the last 72 hours. Anemia Panel: No results for input(s): "VITAMINB12", "FOLATE", "FERRITIN", "TIBC", "IRON", "RETICCTPCT" in the last 72 hours. Sepsis Labs: No results for input(s): "PROCALCITON", "LATICACIDVEN" in the last 168 hours.  Recent Results (from the past 240 hours)  MRSA Next Gen by PCR, Nasal     Status: None   Collection Time: 03/07/23  6:06 PM   Specimen: Nasal Mucosa; Nasal Swab  Result Value Ref Range Status   MRSA by PCR Next Gen NOT DETECTED NOT DETECTED Final    Comment: (NOTE) The GeneXpert MRSA Assay (FDA approved for NASAL specimens only), is one component of a comprehensive MRSA colonization surveillance program. It is not intended to diagnose MRSA infection nor to guide or monitor treatment for MRSA infections. Test performance is not FDA approved in patients less than 30 years old. Performed at [facility], [address] [place], [place] [address]   Culture, blood (Routine X 2) w Reflex to ID Panel     Status: None (Preliminary result)   Collection Time: 03/10/23 12:17 PM   Specimen: BLOOD  Result Value Ref Range Status    Specimen Description BLOOD RIGHT ANTECUBITAL  Final   Special Requests   Final    BOTTLES DRAWN AEROBIC AND ANAEROBIC Blood Culture results may not be optimal due to an excessive volume of blood received in culture bottles   Culture   Final    NO GROWTH < 24 HOURS Performed at [facility], [address] [place], [place] [address]    Report Status PENDING  Incomplete  Culture, blood (Routine X 2) w Reflex to ID Panel     Status: None (Preliminary result)   Collection Time: 03/10/23 12:25 PM   Specimen: BLOOD  Result Value Ref Range Status   Specimen Description BLOOD BLOOD RIGHT HAND  Final   Special Requests   Final    BOTTLES DRAWN AEROBIC AND ANAEROBIC Blood Culture results may not be optimal due to an inadequate volume of blood received in culture bottles   Culture   Final    NO GROWTH < 24 HOURS Performed at [facility], [address], [place], [place] [address]    Report Status PENDING  Incomplete         Radiology Studies: DG Chest Port 1 View Result Date: 03/10/2023 CLINICAL DATA:  Fever EXAM: PORTABLE CHEST 1 VIEW COMPARISON:  03/19/2022 FINDINGS: Patient is slightly rotated. Heart size is upper limits of normal. Slightly increased interstitial markings at the left lung base. No lobar consolidation. No pleural effusion or pneumothorax. IMPRESSION: Slightly increased interstitial markings at the left lung base, which may reflect atelectasis versus developing infiltrate. Electronically Signed   By: Duanne Guess D.O.   On: 03/10/2023 13:42        Scheduled Meds:  amLODipine  5 mg Oral Daily   aspirin EC  81 mg Oral Daily   Chlorhexidine Gluconate Cloth  6 each Topical Daily   clopidogrel  75 mg Oral Daily   enoxaparin (LOVENOX) injection  40 mg Subcutaneous QHS   gabapentin  300 mg Oral BID   pantoprazole  40 mg Oral q morning   rosuvastatin  20 mg Oral Daily   sodium chloride flush  3 mL Intravenous Q12H   sodium chloride flush  3 mL  Intravenous Q12H   sodium chloride flush  3 mL Intravenous Q12H   Continuous Infusions:     LOS: 4 days       Charise Killian, MD [facility] Pager [phone]  If 7PM-7AM, please contact night-coverage 03/11/2023, 8:13 AM

## 2023-03-11 NOTE — Care Management Important Message (Signed)
Important Message  Patient Details  Name: Jackie Macias MRN: [id number] Date of Birth: [birth year]   Important Message Given:  N/A - LOS <3 / Initial given by admissions     [name] A [name] 03/11/2023, 10:06 AM

## 2023-03-11 NOTE — TOC Progression Note (Addendum)
Transition of Care [facility]) - Progression Note    Patient Details  Name: Jackie Macias MRN: [id number] Date of Birth: Oct 20, 1955  Transition of Care [facility] CM/SW Contact  Liliana Cline, LCSW Phone Number: 03/11/2023, 11:50 AM  Clinical Narrative:    CSW spoke with patient. Patient states she has decided she does not want SNF, but is agreeable to Home Health. Confirmed home address in the chart. Patient states she lives with her fiance Lyda Jester who will provide support at home. Her son or daughter will provide transport. PCP is [facility]. Pharmacy is [facility]. Patient requests a RW and BSC for home. Updated handoff to order closer to [place]. Referral made to [facility] with [facility] for [facility]. Asked MD for orders.        Expected Discharge Plan and Services                                               Social Determinants of Health (SDOH) Interventions SDOH Screenings   Food Insecurity: No Food Insecurity (03/07/2023)  Housing: Low Risk  (03/07/2023)  Transportation Needs: No Transportation Needs (03/07/2023)  Utilities: Not At Risk (03/07/2023)  Tobacco Use: High Risk (11/30/2022)   Received from [facility]    Readmission Risk Interventions     No data to display

## 2023-03-11 NOTE — Progress Notes (Signed)
Occupational Therapy Treatment Patient Details Name: Jackie Macias MRN: [id number] DOB: April 14, 1955 Today's Date: 03/11/2023   History of present illness Pt is a 67 year old female admitted with Acute critical lower limb ischemia, left  Atherosclerotic occlusive disease bilateral lower extremities 03/07/23 received thrombectomy with initiation of thrombolysis, 12/10  received angioplasty and stent placement to SFA with Successful recanalization left lower extremity for limb salvage.      PMH significant for PAD s/p angiography (most recent June 2024), hypertension, hyperlipidemia, bilateral carotid artery disease, COPD,   OT comments  Pt agreeable to participation in OT/PT cotx session this date for pt/clinician safety.  Pt sat EOB to don R sock in prep for standing, and requiring assist from OT to don L sock d/t pain and limited reach.  Pt participated in standing x3 trials, progressing from max A of 2 to mod A of 2 by 3rd trial.  Pt required max vc for hand placement and assist to shift walker during side stepping d/t extensive weight bearing through BUEs and pt tilting the walker.  Pt eager to transfer to [facility], though not yet appropriate for this transfer d/t pt only tolerating brief standing and shuffling at bedside to side step toward [facility].  Assisted with bed pan placement end of session with call bell and phone within reach.  Pt requested some time to void and requested to use call light for RN when finished voiding.  Will continue to follow in the acute setting to work towards OT goals.  Continue to recommend SNF at d/c.       If plan is discharge home, recommend the following:  Two people to help with walking and/or transfers;A lot of help with bathing/dressing/bathroom;Assistance with cooking/housework;Direct supervision/assist for medications management;Assist for transportation;Help with stairs or ramp for entrance;Direct supervision/assist for financial management   Equipment  Recommendations  Other (comment) (defer to next venue of care)    Recommendations for Other Services      Precautions / Restrictions Precautions Precautions: Fall Restrictions Weight Bearing Restrictions Per Provider Order: No       Mobility Bed Mobility Overal bed mobility: Needs Assistance Bed Mobility: Supine to Sit, Sit to Supine     Supine to sit: Min assist, +2 for physical assistance, HOB elevated, Used rails Sit to supine: Mod assist, +2 for physical assistance, +2 for safety/equipment, Used rails   General bed mobility comments: increased time d/t pain and high effort Patient Response: Cooperative  Transfers Overall transfer level: Needs assistance Equipment used: Rolling walker (2 wheels) Transfers: Sit to/from Stand Sit to Stand: Mod assist, Max assist, +2 physical assistance           General transfer comment: sit to stand x3, trial 1 and 2 requiring max A of 2, trial 3 requiring mod A of 2; vc for hand placement     Balance Overall balance assessment: Needs assistance, History of Falls Sitting-balance support: Feet supported Sitting balance-Leahy Scale: Fair Sitting balance - Comments: able to don R sock sitting EOB with extra time, intermittent support of hands on bed   Standing balance support: Bilateral upper extremity supported, During functional activity, Reliant on assistive device for balance Standing balance-Leahy Scale: Poor                             ADL either performed or assessed with clinical judgement   ADL Overall ADL's : Needs assistance/impaired  Lower Body Dressing: Moderate assistance;Bed level;Sitting/lateral leans Lower Body Dressing Details (indicate cue type and reason): Able to don R sock sitting EOB, extra time and effort; assist to don L sock d/t pain/limited reach. Pt would be able to hike underwear/pants from supine with mod A, but would require total assist for hiking in standing      Toileting- Clothing Manipulation and Hygiene: Total assistance Toileting - Clothing Manipulation Details (indicate cue type and reason): required bed pan d/t not yet able to transfer safely to [facility]     Functional mobility during ADLs: Moderate assistance;+2 for safety/equipment;+2 for physical assistance General ADL Comments: Participated in side stepping and standing at bedside to work towards transfering to [facility] (not yet able to use [facility])    Extremity/Trunk Assessment Upper Extremity Assessment Upper Extremity Assessment: Overall WFL for tasks assessed   Lower Extremity Assessment Lower Extremity Assessment: LLE deficits/detail;Defer to PT evaluation        Vision Patient Visual Report: No change from baseline     Perception     Praxis      Cognition Arousal: Alert Behavior During Therapy: WFL for tasks assessed/performed Overall Cognitive Status: Within Functional Limits for tasks assessed                                          Exercises      Shoulder Instructions       General Comments      Pertinent Vitals/ Pain       Pain Assessment Pain Score: 7  Pain Location: LLE Pain Descriptors / Indicators: Discomfort, Grimacing, Sore, Aching Pain Intervention(s): Limited activity within patient's tolerance, Monitored during session, Repositioned  Home Living                                          Prior Functioning/Environment              Frequency  Min 1X/week        Progress Toward Goals  OT Goals(current goals can now be found in the care plan section)  Progress towards OT goals: Progressing toward goals  Acute Rehab OT Goals Patient Stated Goal: go home OT Goal Formulation: With patient Time For Goal Achievement: 03/23/23 Potential to Achieve Goals: Fair  Plan      Co-evaluation      Reason for Co-Treatment: Complexity of the patient's impairments (multi-system involvement);Necessary to address  cognition/behavior during functional activity;To address functional/ADL transfers;For patient/therapist safety PT goals addressed during session: Mobility/safety with mobility;Balance OT goals addressed during session: ADL's and self-care;Proper use of Adaptive equipment and DME      AM-PAC OT "6 Clicks" Daily Activity     Outcome Measure   Help from another person eating meals?: None Help from another person taking care of personal grooming?: A Little Help from another person toileting, which includes using toliet, bedpan, or urinal?: Total Help from another person bathing (including washing, rinsing, drying)?: A Lot Help from another person to put on and taking off regular upper body clothing?: A Little Help from another person to put on and taking off regular lower body clothing?: A Lot 6 Click Score: 15    End of Session Equipment Utilized During Treatment: Rolling walker (2 wheels);Gait belt  OT Visit Diagnosis: Other abnormalities of  gait and mobility (R26.89);Muscle weakness (generalized) (M62.81);Unsteadiness on feet (R26.81)   Activity Tolerance Patient tolerated treatment well   Patient Left in bed;with call bell/phone within reach   Nurse Communication         Time: 4098-1191 OT Time Calculation (min): 25 min  Charges: OT General Charges $OT Visit: 1 Visit OT Treatments $Self Care/Home Management : 23-37 mins  Danelle Earthly, MS, OTR/L   Otis Dials 03/11/2023, 11:54 AM

## 2023-03-11 NOTE — Progress Notes (Signed)
Physical Therapy Treatment Patient Details Name: Jackie Macias MRN: [id number] DOB: Jun 03, 1955 Today's Date: 03/11/2023   History of Present Illness Pt is a 67 year old female admitted with Acute critical lower limb ischemia, left  Atherosclerotic occlusive disease bilateral lower extremities 03/07/23 received thrombectomy with initiation of thrombolysis, 12/10  received angioplasty and stent placement to SFA with Successful recanalization left lower extremity for limb salvage.      PMH significant for PAD s/p angiography (most recent June 2024), hypertension, hyperlipidemia, bilateral carotid artery disease, COPD,    PT Comments  Pt agrees to session,.  Transitions to sitting EOB with min a x 2.  Participates in donning socks with OT.  She needs increased time to prep for standing and on first attempt, she is unable to clear hips with max a x 2.  On seconds, clears with mod a x 2 and is unable to step.  On 3rd she stands with mod a x 2 and is able to take 3 very small sidesteps along bed, but not well or far enough to complete transfer to chair/BSC.  Bed is left at height so simulate chair or BSC.  Pt does voice she needed to void during session but given level of assist and difficulty standing from commode height transfer to [facility] or chair is deferred.  She is assisted back to supine with min/mod a x 2 and bedpan is placed.  Pt to call tech when finished and tech aware as she asked for some increased time.     If plan is discharge home, recommend the following: Two people to help with walking and/or transfers;Help with stairs or ramp for entrance;Assist for transportation;Assistance with cooking/housework;Two people to help with bathing/dressing/bathroom   Can travel by private vehicle        Equipment Recommendations  Rolling walker (2 wheels);Other (comment)    Recommendations for Other Services       Precautions / Restrictions Precautions Precautions: Fall Restrictions Weight Bearing  Restrictions Per Provider Order: No     Mobility  Bed Mobility Overal bed mobility: Needs Assistance Bed Mobility: Supine to Sit, Sit to Supine     Supine to sit: Min assist, +2 for physical assistance, HOB elevated, Used rails Sit to supine: Mod assist, +2 for physical assistance, +2 for safety/equipment, Used rails   General bed mobility comments: increased time, frequent vcs for technique, progress Patient Response: Cooperative  Transfers Overall transfer level: Needs assistance Equipment used: Rolling walker (2 wheels) Transfers: Sit to/from Stand Sit to Stand: Mod assist, Max assist, +2 physical assistance           General transfer comment: did stand fully on 3rd attempt    Ambulation/Gait Ambulation/Gait assistance: Mod assist, +2 physical assistance Gait Distance (Feet): 3 Feet Assistive device: Rolling walker (2 wheels) Gait Pattern/deviations: Step-to pattern       General Gait Details: 3 small sidesteps along bed with max encouragement and heavy assist +2   Stairs             Wheelchair Mobility     Tilt Bed Tilt Bed Patient Response: Cooperative  Modified Rankin (Stroke Patients Only)       Balance Overall balance assessment: Needs assistance, History of Falls Sitting-balance support: Feet supported Sitting balance-Leahy Scale: Fair     Standing balance support: Bilateral upper extremity supported, During functional activity, Reliant on assistive device for balance Standing balance-Leahy Scale: Poor Standing balance comment: initially zero on first 2 attempts but on 3rd she  does much better                            Cognition Arousal: Alert Behavior During Therapy: WFL for tasks assessed/performed Overall Cognitive Status: Within Functional Limits for tasks assessed                                          Exercises      General Comments        Pertinent Vitals/Pain Pain Assessment Pain  Assessment: 0-10 Pain Score: 7  Pain Location: LLE Pain Descriptors / Indicators: Discomfort, Grimacing, Sore Pain Intervention(s): Limited activity within patient's tolerance, Monitored during session, Repositioned    Home Living                          Prior Function            PT Goals (current goals can now be found in the care plan section) Progress towards PT goals: Progressing toward goals    Frequency    Min 1X/week      PT Plan      Co-evaluation PT/OT/SLP Co-Evaluation/Treatment: Yes Reason for Co-Treatment: Complexity of the patient's impairments (multi-system involvement);Necessary to address cognition/behavior during functional activity;To address functional/ADL transfers;For patient/therapist safety PT goals addressed during session: Mobility/safety with mobility;Balance OT goals addressed during session: ADL's and self-care;Proper use of Adaptive equipment and DME      AM-PAC PT "6 Clicks" Mobility   Outcome Measure  Help needed turning from your back to your side while in a flat bed without using bedrails?: A Lot Help needed moving from lying on your back to sitting on the side of a flat bed without using bedrails?: A Lot Help needed moving to and from a bed to a chair (including a wheelchair)?: Total Help needed standing up from a chair using your arms (e.g., wheelchair or bedside chair)?: A Lot Help needed to walk in hospital room?: Total Help needed climbing 3-5 steps with a railing? : Total 6 Click Score: 9    End of Session Equipment Utilized During Treatment: Gait belt Activity Tolerance: Patient limited by pain;Patient limited by fatigue Patient left: in bed;with call bell/phone within reach;with bed alarm set Nurse Communication: Mobility status PT Visit Diagnosis: Other abnormalities of gait and mobility (R26.89);Pain;Muscle weakness (generalized) (M62.81);Difficulty in walking, not elsewhere classified (R26.2) Pain - Right/Left:  Left Pain - part of body: Leg     Time: 8119-1478 PT Time Calculation (min) (ACUTE ONLY): 26 min  Charges:    $Therapeutic Activity: 8-22 mins PT General Charges $$ ACUTE PT VISIT: 1 Visit                    Danielle Dess, PTA 03/11/23, 10:08 AM

## 2023-03-11 NOTE — Progress Notes (Signed)
Patient is not able to walk the distance required to go the bathroom, or he/she is unable to safely negotiate stairs required to access the bathroom.  A BSC will alleviate this problem.

## 2023-03-11 NOTE — Progress Notes (Signed)
  Progress Note    03/11/2023 7:33 AM 3 Days Post-Op  Subjective:  Jackie Macias is a 67 yo female now POD #2 from left lower extremity angioplasty and stent placement of the left superficial femoral and popliteal arteries. Patient resting comfortably in bed. Endorses some left foot pain but its better than it was. Endorses her entire leg feels warmer. No complications overnight and vitals all remain stable    Vitals:   03/10/23 2141 03/11/23 0625  BP: 138/60 (!) 121/54  Pulse: 79 76  Resp: 16 18  Temp: (!) 100.8 F (38.2 C) 99.9 F (37.7 C)  SpO2: 96% 97%   Physical Exam: Cardiac:  RRR, Normal S1, S2, No murmur Lungs:  Coarse on auscultation. Diminished in the bases. No rales or wheezing Incisions:  right groin with puncture for catheter placement. Dressing clean dry and intact. No hematoma or seroma to note.  Extremities:  Left lower extremity warm to touch, Positive doppler DP/PT pulses. Patient able to wiggle toes.  Abdomen:  Positive bowel sounds throughout, soft, non tender and non distended.  Neurologic: AAOX4, answers all questions and follows commands appropriately.  CBC    Component Value Date/Time   WBC 13.1 (H) 03/10/2023 1036   RBC 2.98 (L) 03/10/2023 1036   HGB 9.8 (L) 03/10/2023 1036   HCT 29.3 (L) 03/10/2023 1036   PLT 195 03/10/2023 1036   MCV 98.3 03/10/2023 1036   MCH 32.9 03/10/2023 1036   MCHC 33.4 03/10/2023 1036   RDW 13.7 03/10/2023 1036   LYMPHSABS 2.7 03/06/2023 0844   MONOABS 1.1 (H) 03/06/2023 0844   EOSABS 0.0 03/06/2023 0844   BASOSABS 0.0 03/06/2023 0844    BMET    Component Value Date/Time   NA 132 (L) 03/11/2023 0555   NA 138 11/22/2022 1042   K 3.5 03/11/2023 0555   CL 101 03/11/2023 0555   CO2 25 03/11/2023 0555   GLUCOSE 130 (H) 03/11/2023 0555   BUN 13 03/11/2023 0555   BUN 18 11/22/2022 1042   CREATININE 0.81 03/11/2023 0555   CALCIUM 8.1 (L) 03/11/2023 0555   GFRNONAA >60 03/11/2023 0555   GFRAA >60 09/15/2018 1742     INR    Component Value Date/Time   INR 1.04 01/05/2017 1328     Intake/Output Summary (Last 24 hours) at 03/11/2023 0733 Last data filed at 03/11/2023 0400 Gross per 24 hour  Intake 572.07 ml  Output 200 ml  Net 372.07 ml     Assessment/Plan:  67 y.o. female is s/p left lower extremity angioplasty and stent placement of the left superficial femoral and popliteal arteries.  3 Days Post-Op   PLAN: Follow-up chest 3 from 03/10/2023 shows left lower leg atelectasis.  CBC ordered this morning to recheck WBC.  Instructed patient on the use of incentive spirometry every hour while awake. Continue ASA 81 mg daily Plavix 75 mg daily and Crestor 20 mg Daily and on discharge  Pain medication PRN OOB Most of the day with the use of incentive spirometry to clear left lower base atelectasis and prevent pneumonia. Ambulation with assistance as often as possible..  Advance diet as tolerated.   DVT prophylaxis:  Heparin Infusion    [name] 03/11/2023 7:33 AM

## 2023-03-12 DIAGNOSIS — I998 Other disorder of circulatory system: Secondary | ICD-10-CM | POA: Diagnosis not present

## 2023-03-12 LAB — MAGNESIUM: Magnesium: 1.9 mg/dL (ref 1.7–2.4)

## 2023-03-12 LAB — BASIC METABOLIC PANEL
Anion gap: 8 (ref 5–15)
BUN: 13 mg/dL (ref 8–23)
CO2: 23 mmol/L (ref 22–32)
Calcium: 7.9 mg/dL — ABNORMAL LOW (ref 8.9–10.3)
Chloride: 97 mmol/L — ABNORMAL LOW (ref 98–111)
Creatinine, Ser: 0.67 mg/dL (ref 0.44–1.00)
GFR, Estimated: >60 mL/min (ref >60)
Glucose, Bld: 100 mg/dL — ABNORMAL HIGH (ref 70–99)
Potassium: 3.2 mmol/L — ABNORMAL LOW (ref 3.5–5.1)
Sodium: 128 mmol/L — ABNORMAL LOW (ref 135–145)

## 2023-03-12 MED ORDER — POTASSIUM CHLORIDE CRYS ER 20 MEQ PO TBCR
40.0000 meq | EXTENDED_RELEASE_TABLET | Freq: Once | ORAL | Status: AC
  Administered 2023-03-12: 40 meq via ORAL
  Filled 2023-03-12: qty 2

## 2023-03-12 NOTE — Progress Notes (Signed)
PROGRESS NOTE   HPI was taken from Dr. Huel Cote: Jackie Macias is a 67 y.o. female with medical history significant of PAD s/p angiography (most recent June 2024), hypertension, hyperlipidemia, bilateral carotid artery disease, frequent falls, B12 deficiency, hypothyroidism, gout, COPD, who presents to the ED due to left foot pain   Mr. Cupo states that she had a fall earlier this week where she fell onto her right knee when she was bringing groceries due to tripping on a branch outside.  She did not hit her left leg at all, however approximately 4 days ago, she began to experience severe left lower leg and ankle pain with swelling.  She sleeps on a air mattress normally and today, she slid off the bed while asleep.  Due to severity of pain, she was unable to get up by herself and EMS was called.   ED course: On arrival to the ED, patient was hypertensive at 145/85 with heart rate 77.  She was saturating at 95% on room air.  She was afebrile 98.1. Initial workup notable for WBC of 10.8, hemoglobin of 12.6, sodium 134, glucose 110, GFR above 60.  Right knee x-ray with osteoarthritis and moderate-sized effusion.  Left foot x-ray with soft tissue swelling only.  Left DVT study negative.  Due to lack of pulses on the left, vascular surgery was consulted with recommendations to obtain a CT runoff and start heparin infusion.  [facility] contacted for admission.      Jackie Macias  TDD:[id number] DOB: [birth year] DOA: 03/06/2023 PCP: Miki Kins, FNP   Assessment & Plan:   Principal Problem:   Acute lower limb ischemia Active Problems:   Essential hypertension   Polyarthritis, unspecified   Mixed hyperlipidemia   Chronic obstructive pulmonary disease (COPD) (HCC)   Ischemic foot   Left foot pain  Assessment and Plan: Acute left critical lower limb ischemia: presenting with severe left foot pain and swelling in the absence of pulses concerning for acute limb ischemia. Was taking plavix at  home. CT angio with findings of stent occlusion and no significant arterial supply to the left foot below the level of the ankle mortise. S/p thrombectomy with initiation of thrombolysis on 12/9 & s/p angioplasty and stent placement to SFA with successful recanalization left lower extremity for limb salvage 12/10 as per vasc surg. D/c IV heparin. Continue on plavix, aspirin as per vasc surg. Vasc surg recs apprec  Leukocytosis: likely secondary to pneumonia. Continue on IV abxs.   Likely pneumonia: as seen on CXR & w/ increasing WBCs, cough & spiking fevers. Continue on IV rocephin, azithromycin, bronchodilators. Encourage incentive spirometry    COPD: w/o exacerbation. Bronchodilators prn    HLD: continue on statin    Hx of polyarthritis: including the right hip, right knee, with acute exacerbation of the right knee due to fall earlier this week. PT/OT recs SNF. Pt refuses SNF but agrees to [facility]    HTN: continue on amlodipine. IV hydralazine prn        DVT prophylaxis: lovenox  Code Status: full  Family Communication: discussed pt's care w/ pt's son, Rosevelet, and answered his questions  Disposition Plan:  pt refuses SNF but agrees to [facility]   Level of care: Med-Surg  Status is: Inpatient Remains inpatient appropriate because: spiking fevers still, started on IV abxs yesterday. Needs to be fever free 24 prior to d/c     Consultants:  Vasc surg   Procedures:   Antimicrobials:    Subjective: Pt  c/o fatigue   Objective: Vitals:   03/11/23 2057 03/12/23 0349 03/12/23 0537 03/12/23 0812  BP: (!) 125/52  126/61 (!) 116/59  Pulse: 76  68 69  Resp: 18  20 18   Temp: (!) 100.8 F (38.2 C)  99.6 F (37.6 C) 99.7 F (37.6 C)  TempSrc:      SpO2: 94%  94% 98%  Weight:  98.6 kg    Height:        Intake/Output Summary (Last 24 hours) at 03/12/2023 4403 Last data filed at 03/11/2023 1804 Gross per 24 hour  Intake 350 ml  Output --  Net 350 ml   Filed Weights   03/09/23  0500 03/10/23 0500 03/12/23 0349  Weight: 80.5 kg 78.5 kg 98.6 kg    Examination:  General exam: Appears calm & comfortable  Respiratory system: decreased breath sounds b/l Cardiovascular system:  S1/S2+. No rubs or clicks  Gastrointestinal system: abd is soft, NT, ND & hypoactive bowel sounds Central nervous system: alert & oriented. Moves all extremities Psychiatry: Judgement and insight appears at baseline. Appropriate mood and affect    Data Reviewed: I have personally reviewed following labs and imaging studies  CBC: Recent Labs  Lab 03/06/23 0844 03/07/23 0736 03/08/23 0608 03/08/23 1126 03/09/23 0419 03/10/23 1036 03/11/23 1235  WBC 10.8*   < > 8.5 9.6 11.5* 13.1* 16.3*  NEUTROABS 6.8  --   --   --   --   --   --   HGB 12.6   < > 10.1* 10.3* 9.6* 9.8* 8.3*  HCT 37.8   < > 29.9* 29.9* 28.8* 29.3* 24.0*  MCV 100.0   < > 97.7 95.8 99.0 98.3 95.6  PLT 306   < > 195 201 190 195 200   < > = values in this interval not displayed.   Basic Metabolic Panel: Recent Labs  Lab 03/08/23 0608 03/09/23 0419 03/10/23 1036 03/11/23 0555 03/12/23 0244  NA 134* 133* 132* 132* 128*  K 3.1* 3.0* 3.4* 3.5 3.2*  CL 100 101 99 101 97*  CO2 25 24 26 25 23   GLUCOSE 111* 145* 150* 130* 100*  BUN 9 14 13 13 13   CREATININE 0.62 0.85 0.85 0.81 0.67  CALCIUM 8.5* 8.2* 8.1* 8.1* 7.9*  MG 1.7 1.7 2.0 1.9 1.9   GFR: Estimated Creatinine Clearance: 78.2 mL/min (by C-G formula based on SCr of 0.67 mg/dL). Liver Function Tests: Recent Labs  Lab 03/06/23 0844  AST 42*  ALT 19  ALKPHOS 70  BILITOT 0.7  PROT 8.0  ALBUMIN 3.7   No results for input(s): "LIPASE", "AMYLASE" in the last 168 hours. No results for input(s): "AMMONIA" in the last 168 hours. Coagulation Profile: No results for input(s): "INR", "PROTIME" in the last 168 hours. Cardiac Enzymes: No results for input(s): "CKTOTAL", "CKMB", "CKMBINDEX", "TROPONINI" in the last 168 hours. BNP (last 3 results) No results for  input(s): "PROBNP" in the last 8760 hours. HbA1C: No results for input(s): "HGBA1C" in the last 72 hours. CBG: Recent Labs  Lab 03/07/23 1531  GLUCAP 102*   Lipid Profile: No results for input(s): "CHOL", "HDL", "LDLCALC", "TRIG", "CHOLHDL", "LDLDIRECT" in the last 72 hours. Thyroid Function Tests: No results for input(s): "TSH", "T4TOTAL", "FREET4", "T3FREE", "THYROIDAB" in the last 72 hours. Anemia Panel: No results for input(s): "VITAMINB12", "FOLATE", "FERRITIN", "TIBC", "IRON", "RETICCTPCT" in the last 72 hours. Sepsis Labs: Recent Labs  Lab 03/11/23 0555  PROCALCITON 0.50    Recent Results (from the past 240  hours)  MRSA Next Gen by PCR, Nasal     Status: None   Collection Time: 03/07/23  6:06 PM   Specimen: Nasal Mucosa; Nasal Swab  Result Value Ref Range Status   MRSA by PCR Next Gen NOT DETECTED NOT DETECTED Final    Comment: (NOTE) The GeneXpert MRSA Assay (FDA approved for NASAL specimens only), is one component of a comprehensive MRSA colonization surveillance program. It is not intended to diagnose MRSA infection nor to guide or monitor treatment for MRSA infections. Test performance is not FDA approved in patients less than 22 years old. Performed at [facility], [address] [place], [place] [address]   Culture, blood (Routine X 2) w Reflex to ID Panel     Status: None (Preliminary result)   Collection Time: 03/10/23 12:17 PM   Specimen: BLOOD  Result Value Ref Range Status   Specimen Description BLOOD RIGHT ANTECUBITAL  Final   Special Requests   Final    BOTTLES DRAWN AEROBIC AND ANAEROBIC Blood Culture results may not be optimal due to an excessive volume of blood received in culture bottles   Culture   Final    NO GROWTH 2 DAYS Performed at [facility], [address], [place], [place] [address]    Report Status PENDING  Incomplete  Culture, blood (Routine X 2) w Reflex to ID Panel     Status: None (Preliminary result)    Collection Time: 03/10/23 12:25 PM   Specimen: BLOOD  Result Value Ref Range Status   Specimen Description BLOOD BLOOD RIGHT HAND  Final   Special Requests   Final    BOTTLES DRAWN AEROBIC AND ANAEROBIC Blood Culture results may not be optimal due to an inadequate volume of blood received in culture bottles   Culture   Final    NO GROWTH 2 DAYS Performed at [facility], [address] [place], [place] [address]    Report Status PENDING  Incomplete         Radiology Studies: DG Chest Port 1 View Result Date: 03/10/2023 CLINICAL DATA:  Fever EXAM: PORTABLE CHEST 1 VIEW COMPARISON:  03/19/2022 FINDINGS: Patient is slightly rotated. Heart size is upper limits of normal. Slightly increased interstitial markings at the left lung base. No lobar consolidation. No pleural effusion or pneumothorax. IMPRESSION: Slightly increased interstitial markings at the left lung base, which may reflect atelectasis versus developing infiltrate. Electronically Signed   By: Duanne Guess D.O.   On: 03/10/2023 13:42        Scheduled Meds:  amLODipine  5 mg Oral Daily   aspirin EC  81 mg Oral Daily   Chlorhexidine Gluconate Cloth  6 each Topical Daily   clopidogrel  75 mg Oral Daily   enoxaparin (LOVENOX) injection  40 mg Subcutaneous QHS   gabapentin  300 mg Oral BID   pantoprazole  40 mg Oral q morning   rosuvastatin  20 mg Oral Daily   sodium chloride flush  3 mL Intravenous Q12H   Continuous Infusions:  azithromycin 500 mg (03/11/23 1218)   cefTRIAXone (ROCEPHIN)  IV 1 g (03/11/23 1600)      LOS: 5 days       Charise Killian, MD [facility] Pager [phone]  If 7PM-7AM, please contact night-coverage 03/12/2023, 8:22 AM

## 2023-03-12 NOTE — Plan of Care (Signed)

## 2023-03-13 DIAGNOSIS — R600 Localized edema: Secondary | ICD-10-CM

## 2023-03-13 DIAGNOSIS — J439 Emphysema, unspecified: Secondary | ICD-10-CM

## 2023-03-13 DIAGNOSIS — J181 Lobar pneumonia, unspecified organism: Secondary | ICD-10-CM

## 2023-03-13 DIAGNOSIS — E669 Obesity, unspecified: Secondary | ICD-10-CM

## 2023-03-13 DIAGNOSIS — E871 Hypo-osmolality and hyponatremia: Secondary | ICD-10-CM

## 2023-03-13 DIAGNOSIS — E782 Mixed hyperlipidemia: Secondary | ICD-10-CM | POA: Diagnosis not present

## 2023-03-13 DIAGNOSIS — E876 Hypokalemia: Secondary | ICD-10-CM

## 2023-03-13 DIAGNOSIS — I998 Other disorder of circulatory system: Secondary | ICD-10-CM | POA: Diagnosis not present

## 2023-03-13 LAB — CBC
HCT: 23.8 % — ABNORMAL LOW (ref 36.0–46.0)
Hemoglobin: 8.1 g/dL — ABNORMAL LOW (ref 12.0–15.0)
MCH: 33.1 pg (ref 26.0–34.0)
MCHC: 34 g/dL (ref 30.0–36.0)
MCV: 97.1 fL (ref 80.0–100.0)
Platelets: 257 10*3/uL (ref 150–400)
RBC: 2.45 MIL/uL — ABNORMAL LOW (ref 3.87–5.11)
RDW: 13.9 % (ref 11.5–15.5)
WBC: 12.3 10*3/uL — ABNORMAL HIGH (ref 4.0–10.5)
nRBC: 0 % (ref 0.0–0.2)

## 2023-03-13 LAB — BASIC METABOLIC PANEL
Anion gap: 9 (ref 5–15)
BUN: 12 mg/dL (ref 8–23)
CO2: 23 mmol/L (ref 22–32)
Calcium: 8.2 mg/dL — ABNORMAL LOW (ref 8.9–10.3)
Chloride: 98 mmol/L (ref 98–111)
Creatinine, Ser: 0.7 mg/dL (ref 0.44–1.00)
GFR, Estimated: >60 mL/min (ref >60)
Glucose, Bld: 107 mg/dL — ABNORMAL HIGH (ref 70–99)
Potassium: 3.7 mmol/L (ref 3.5–5.1)
Sodium: 130 mmol/L — ABNORMAL LOW (ref 135–145)

## 2023-03-13 MED ORDER — SODIUM CHLORIDE 1 G PO TABS
1.0000 g | ORAL_TABLET | Freq: Two times a day (BID) | ORAL | Status: DC
  Administered 2023-03-13 – 2023-03-14 (×3): 1 g via ORAL
  Filled 2023-03-13 (×3): qty 1

## 2023-03-13 MED ORDER — POTASSIUM CHLORIDE CRYS ER 20 MEQ PO TBCR
40.0000 meq | EXTENDED_RELEASE_TABLET | Freq: Once | ORAL | Status: AC
  Administered 2023-03-13: 40 meq via ORAL
  Filled 2023-03-13: qty 2

## 2023-03-13 MED ORDER — AZITHROMYCIN 500 MG PO TABS: 500.0000 mg | ORAL_TABLET | Freq: Every day | ORAL | Status: DC

## 2023-03-13 MED ORDER — FUROSEMIDE 20 MG PO TABS
20.0000 mg | ORAL_TABLET | Freq: Every day | ORAL | Status: DC
  Administered 2023-03-13 – 2023-03-14 (×2): 20 mg via ORAL
  Filled 2023-03-13 (×2): qty 1

## 2023-03-13 MED ORDER — SODIUM CHLORIDE 0.9 % IV SOLN
2.0000 g | INTRAVENOUS | Status: DC
  Administered 2023-03-13 – 2023-03-14 (×2): 2 g via INTRAVENOUS
  Filled 2023-03-13 (×2): qty 20

## 2023-03-13 MED ORDER — FERROUS SULFATE 325 (65 FE) MG PO TABS
325.0000 mg | ORAL_TABLET | Freq: Every day | ORAL | Status: DC
  Administered 2023-03-14: 325 mg via ORAL
  Filled 2023-03-13: qty 1

## 2023-03-13 NOTE — Plan of Care (Signed)

## 2023-03-13 NOTE — Progress Notes (Addendum)
Progress Note   Patient: Jackie Macias MVH:[id number] DOB: 09/25/1955 DOA: 03/06/2023     6 DOS: the patient was seen and examined on 03/13/2023   Brief hospital course: 67 year old female past medical history of peripheral arterial disease, hypertension, hyperlipidemia, bilateral carotid artery disease, COPD presented to the emergency with left foot pain.  CT angio showed left SFA and popliteal artery stents are occluded, right external iliac 50% occlusion.  12/9.  Dr. Wyn Quaker did a mechanical thrombectomy of the left SFA, popliteal artery and tibioperoneal trunk and angioplasty of left peroneal artery and tibioperoneal trunk and thrombolytic therapy. 12/10.  Dr. Gilda Crease performed artery percutaneous transluminal angioplasty and stent placement to left superficial femoral artery and popliteal arteries, angioplasty left peroneal artery. 12/13.  Patient started on antibiotics Rocephin and Zithromax for pneumonia. 12/15.  Noticed left first, third and fifth toe discolored along with the bottom of the left foot and asked vascular surgery to come and reevaluate.   Assessment and Plan: * Acute lower limb ischemia Patient brought for 2 procedures by vascular team on 12/9 and 12/10.  Currently on aspirin and Plavix.  My first time seeing her today I am seeing discoloration of the first third and fifth toe and some discoloration on the bottom of the left foot and some healing scabs on the shin.  Asked vascular surgery to reevaluate.  Lobar pneumonia (HCC) Patient on Rocephin and Zithromax.  Essential hypertension Continue Norvasc  Mixed hyperlipidemia Continue Crestor  Chronic obstructive pulmonary disease (COPD) (HCC) As needed nebulizers  Hyponatremia Trial of salt tablets until sodium comes up.  Hypokalemia Replaced during the hospital course  Lower extremity edema Low-dose Lasix.  Obesity (BMI 30-39.9) BMI 36.50  Iron deficiency anemia Last hemoglobin 8.1.  Start oral  iron        Subjective: Patient feels okay.  Some cough.  Does have some pain in her left foot when she puts weight down on it.  She states her leg is better than when she came in.  Had not noticed any discoloration on her foot.  Admitted 7 days ago with ischemic foot.  Physical Exam: Vitals:   03/13/23 0423 03/13/23 0500 03/13/23 0836 03/13/23 0838  BP: (!) 117/52  (!) 110/44   Pulse: 65   72  Resp: 20  17   Temp: 99.6 F (37.6 C)  99 F (37.2 C)   TempSrc:      SpO2: 99%   100%  Weight:  99.5 kg    Height:       Physical Exam HENT:     Head: Normocephalic.  Eyes:     General: Lids are normal.     Conjunctiva/sclera: Conjunctivae normal.  Cardiovascular:     Rate and Rhythm: Normal rate and regular rhythm.     Heart sounds: Normal heart sounds, S1 normal and S2 normal.  Pulmonary:     Breath sounds: Examination of the right-lower field reveals decreased breath sounds. Examination of the left-lower field reveals decreased breath sounds. Decreased breath sounds present. No wheezing, rhonchi or rales.  Abdominal:     Palpations: Abdomen is soft.     Tenderness: There is no abdominal tenderness.  Musculoskeletal:     Right lower leg: No swelling.     Left lower leg: No swelling.  Skin:    Comments: Left foot discoloration first, third and fifth toe, discoloration under the foot and looks like healing scabs on left shin.  Right heel pressure ulcer with bluish blackish discoloration.  Neurological:     Mental Status: She is alert and oriented to person, place, and time.     Data Reviewed: Na 130. Cr 0.7, WBC 12.3, Hb 8.1  Left message for patient's daughter  Disposition: Status is: Inpatient Remains inpatient appropriate because: Will have vascular surgery reevaluate today with skin changes I am seeing.  Since I am seeing her for the first time I am not sure if they are new or from when she came in.  Planned Discharge Destination: Skilled nursing facility    Time  spent: 28 minutes  Author: Alford Highland, MD 03/13/2023 12:44 PM  For on call review [email or web address].

## 2023-03-13 NOTE — Assessment & Plan Note (Signed)
Patient on Rocephin and Zithromax.

## 2023-03-13 NOTE — Assessment & Plan Note (Signed)
Replaced during the hospital course 

## 2023-03-13 NOTE — Assessment & Plan Note (Signed)
Last hemoglobin 8.1.  Start oral iron

## 2023-03-13 NOTE — Progress Notes (Signed)
Femoral central line removed per order and with no complications.  Pressure held to achieve hemostasis.  Vaseline/gauze/tegaderm applied.  After care instructions reviewed with patient including signs of infection, activity restriction, and dressing care.  Patient verbalized understanding.

## 2023-03-13 NOTE — TOC Progression Note (Signed)
Transition of Care [facility]) - Progression Note    Patient Details  Name: Jackie Macias MRN: [id number] Date of Birth: 07-23-1955  Transition of Care [facility]) CM/SW Contact  Rodney Langton, RN Phone Number: 03/13/2023, 3:35 PM  Clinical Narrative:     Spoke with patient again regarding recommendations for SNF, she continues to decline and will go home with [facility] through [place].       Expected Discharge Plan and Services                                               Social Determinants of Health (SDOH) Interventions SDOH Screenings   Food Insecurity: No Food Insecurity (03/07/2023)  Housing: High Risk (03/11/2023)  Transportation Needs: No Transportation Needs (03/07/2023)  Utilities: Not At Risk (03/07/2023)  Tobacco Use: High Risk (11/30/2022)   Received from [facility]    Readmission Risk Interventions     No data to display

## 2023-03-13 NOTE — Assessment & Plan Note (Signed)
Trial of salt tablets until sodium comes up.

## 2023-03-13 NOTE — Assessment & Plan Note (Signed)
BMI 36.50

## 2023-03-13 NOTE — Consult Note (Signed)
PHARMACIST - PHYSICIAN COMMUNICATION  DR: Fidela Juneau  CONCERNING: IV to Oral Route Change Policy  RECOMMENDATION: This patient is receiving azithromycin by the intravenous route.  Based on criteria approved by the Pharmacy and Therapeutics Committee, the antibiotic(s) is/are being converted to the equivalent oral dose form(s).   DESCRIPTION: These criteria include: Patient being treated for a respiratory tract infection, urinary tract infection, cellulitis or clostridium difficile associated diarrhea if on metronidazole The patient is not neutropenic and does not exhibit a GI malabsorption state The patient is eating (either orally or via tube) and/or has been taking other orally administered medications for a least 24 hours The patient is improving clinically and has a Tmax < 100.5  If you have questions about this conversion, please contact the Pharmacy Department  []   [phone] )  [facility] [x]   [phone] )  [facility] []   [phone] )  [facility] []   [phone] )  [facility] []   [phone] )  Ilene Qua   Celene Squibb, PharmD Clinical Pharmacist 03/13/2023 12:58 PM

## 2023-03-13 NOTE — Progress Notes (Signed)
[facility]  Daily Progress Note   Subjective  -   She is POD# 5 after angioplasty and stenting of her left popliteal and peroneal vessels.  Her peroneal collateralizes to a DP and disease PTA.  She had a significant period of ischemia prior.  She feels much improved.  Her Medical Team asked me to evaluate for her progressive skin changes.  No issues from her access site.  Objective Vitals:   03/13/23 0500 03/13/23 0836 03/13/23 0838 03/13/23 1500  BP:  (!) 110/44  125/85  Pulse:   72 (!) 43  Resp:  17  19  Temp:  99 F (37.2 C)    TempSrc:      SpO2:   100% 96%  Weight: 99.5 kg     Height:       No intake or output data in the 24 hours ending 03/13/23 1554  PULM  CTAB CV  RRR VASC  Palpable left femoral pulse.  Right groin fine but has a triple lumen catheter in place and they are using an arm IV.  Excellent left peroneal signal at the ankle.  Good DP and PT signals.  Has darkened skin on great and third toes with extensive skin darkening in the medial foot and some dorsal foot and distal calf areas of darkening.  No ulcer or sloughing yet.  No drainage.  Laboratory CBC    Component Value Date/Time   WBC 12.3 (H) 03/13/2023 0706   HGB 8.1 (L) 03/13/2023 0706   HCT 23.8 (L) 03/13/2023 0706   PLT 257 03/13/2023 0706    BMET    Component Value Date/Time   NA 130 (L) 03/13/2023 0706   NA 138 11/22/2022 1042   K 3.7 03/13/2023 0706   CL 98 03/13/2023 0706   CO2 23 03/13/2023 0706   GLUCOSE 107 (H) 03/13/2023 0706   BUN 12 03/13/2023 0706   BUN 18 11/22/2022 1042   CREATININE 0.70 03/13/2023 0706   CALCIUM 8.2 (L) 03/13/2023 0706   GFRNONAA >60 03/13/2023 0706   GFRAA >60 09/15/2018 1742    Assessment/Planning: POD #6 s/p L popliteal and peroneal artery angioplasty and stenting.  These are progressive skin changes from the prior ischemia now treated.  Local wound care and observation.  They may progress to deep tissue necrosis or remain  superficial.  Nothing to do but care for the tissues and wait.  Given the quality of the signals, her angioplasty and stenting are patent.  ABI study and arterial Duplex if objective studies are interest to the primary team.   Iline Oven  03/13/2023, 3:54 PM

## 2023-03-13 NOTE — Assessment & Plan Note (Signed)
Low-dose Lasix.

## 2023-03-13 NOTE — Hospital Course (Signed)
67 year old female past medical history of peripheral arterial disease, hypertension, hyperlipidemia, bilateral carotid artery disease, COPD presented to the emergency with left foot pain.  CT angio showed left SFA and popliteal artery stents are occluded, right external iliac 50% occlusion.  12/9.  Dr. Wyn Quaker did a mechanical thrombectomy of the left SFA, popliteal artery and tibioperoneal trunk and angioplasty of left peroneal artery and tibioperoneal trunk and thrombolytic therapy. 12/10.  Dr. Gilda Crease performed artery percutaneous transluminal angioplasty and stent placement to left superficial femoral artery and popliteal arteries, angioplasty left peroneal artery. 12/13.  Patient started on antibiotics Rocephin and Zithromax for pneumonia. 12/15.  Noticed left first, third and fifth toe discolored along with the bottom of the left foot and asked vascular surgery to come and reevaluate.

## 2023-03-14 DIAGNOSIS — I10 Essential (primary) hypertension: Secondary | ICD-10-CM | POA: Diagnosis not present

## 2023-03-14 DIAGNOSIS — J440 Chronic obstructive pulmonary disease with acute lower respiratory infection: Secondary | ICD-10-CM

## 2023-03-14 DIAGNOSIS — R531 Weakness: Secondary | ICD-10-CM

## 2023-03-14 DIAGNOSIS — D508 Other iron deficiency anemias: Secondary | ICD-10-CM

## 2023-03-14 DIAGNOSIS — I998 Other disorder of circulatory system: Secondary | ICD-10-CM | POA: Diagnosis not present

## 2023-03-14 DIAGNOSIS — E782 Mixed hyperlipidemia: Secondary | ICD-10-CM | POA: Diagnosis not present

## 2023-03-14 DIAGNOSIS — J181 Lobar pneumonia, unspecified organism: Secondary | ICD-10-CM | POA: Diagnosis not present

## 2023-03-14 MED ORDER — SODIUM CHLORIDE 1 G PO TABS: 1.0000 g | ORAL_TABLET | Freq: Two times a day (BID) | ORAL | 0 refills | Status: DC

## 2023-03-14 MED ORDER — OXYCODONE HCL 5 MG PO TABS: 5.0000 mg | ORAL_TABLET | Freq: Four times a day (QID) | ORAL | 0 refills | Status: AC | PRN

## 2023-03-14 MED ORDER — FERROUS SULFATE 325 (65 FE) MG PO TABS: 325.0000 mg | ORAL_TABLET | Freq: Every day | ORAL | 0 refills | Status: DC

## 2023-03-14 MED ORDER — AMOXICILLIN-POT CLAVULANATE 875-125 MG PO TABS: 1.0000 | ORAL_TABLET | Freq: Two times a day (BID) | ORAL | 0 refills | Status: AC

## 2023-03-14 MED ORDER — DOXYCYCLINE HYCLATE 100 MG PO TABS
100.0000 mg | ORAL_TABLET | Freq: Two times a day (BID) | ORAL | Status: DC
  Administered 2023-03-14: 100 mg via ORAL
  Filled 2023-03-14: qty 1

## 2023-03-14 MED ORDER — ASPIRIN 81 MG PO TBEC: 81.0000 mg | DELAYED_RELEASE_TABLET | Freq: Every day | ORAL | 0 refills | Status: AC

## 2023-03-14 MED ORDER — DOXYCYCLINE HYCLATE 100 MG PO TABS: 100.0000 mg | ORAL_TABLET | Freq: Two times a day (BID) | ORAL | 0 refills | Status: DC

## 2023-03-14 MED ORDER — AMOXICILLIN-POT CLAVULANATE 875-125 MG PO TABS: 1.0000 | ORAL_TABLET | Freq: Two times a day (BID) | ORAL | Status: DC

## 2023-03-14 MED ORDER — POLYETHYLENE GLYCOL 3350 17 G PO PACK
17.0000 g | PACK | Freq: Two times a day (BID) | ORAL | Status: DC
  Administered 2023-03-14: 17 g via ORAL
  Filled 2023-03-14: qty 1

## 2023-03-14 MED ORDER — FUROSEMIDE 20 MG PO TABS: 20.0000 mg | ORAL_TABLET | Freq: Every day | ORAL | 0 refills | Status: DC | PRN

## 2023-03-14 MED ORDER — ACETAMINOPHEN 325 MG PO TABS: 650.0000 mg | ORAL_TABLET | Freq: Four times a day (QID) | ORAL | Status: DC | PRN

## 2023-03-14 MED ORDER — POLYETHYLENE GLYCOL 3350 17 G PO PACK: 17.0000 g | PACK | Freq: Every day | ORAL | 0 refills | Status: DC

## 2023-03-14 MED ORDER — LACTULOSE 10 GM/15ML PO SOLN
30.0000 g | Freq: Every day | ORAL | Status: DC | PRN
  Administered 2023-03-14: 30 g via ORAL
  Filled 2023-03-14: qty 60

## 2023-03-14 MED ORDER — CLOPIDOGREL BISULFATE 75 MG PO TABS: 75.0000 mg | ORAL_TABLET | Freq: Every day | ORAL | 0 refills | Status: DC

## 2023-03-14 NOTE — Discharge Summary (Signed)
Physician Discharge Summary   Patient: Jackie Macias MRN: [id number] DOB: 1955/09/28  Admit date:     03/06/2023  Discharge date: 03/14/23  Discharge Physician: Alford Highland   PCP: Miki Kins, FNP   Recommendations at discharge:   Follow-up PCP 5 days Follow-up vascular surgery 2 weeks Recommend follow-up BMP and CBC in 1 week.  Can likely discontinue salt tablets if sodium comes up into the normal range.  Discharge Diagnoses: Principal Problem:   Acute lower limb ischemia Active Problems:   Lobar pneumonia (HCC)   Essential hypertension   Mixed hyperlipidemia   Chronic obstructive pulmonary disease (COPD) (HCC)   Hyponatremia   Hypokalemia   Lower extremity edema   Obesity (BMI 30-39.9)   Iron deficiency anemia   Polyarthritis, unspecified   Ischemic foot   Left foot pain   Generalized weakness   Hospital Course: 67 year old female past medical history of peripheral arterial disease, hypertension, hyperlipidemia, bilateral carotid artery disease, COPD presented to the emergency with left foot pain.  CT angio showed left SFA and popliteal artery stents are occluded, right external iliac 50% occlusion.  12/9.  Dr. Wyn Quaker did a mechanical thrombectomy of the left SFA, popliteal artery and tibioperoneal trunk and angioplasty of left peroneal artery and tibioperoneal trunk and thrombolytic therapy. 12/10.  Dr. Gilda Crease performed artery percutaneous transluminal angioplasty and stent placement to left superficial femoral artery and popliteal arteries, angioplasty left peroneal artery. 12/13.  Patient started on antibiotics Rocephin and Zithromax for pneumonia. 12/15.  Noticed left first, third and fifth toe discolored along with the bottom of the left foot and asked vascular surgery to come and reevaluate.  Vascular surgery believes these are progressive skin changes from the prior ischemia now treated. 12/16.  Patient refused rehab.  Patient seen by vascular surgery and  cleared to go home.  Patient will need close vascular follow-up as outpatient to see if the skin changes progress or improve.  Patient did have strong Doppler posterior tibial and dorsalis pedis pulses.   Assessment and Plan: * Acute lower limb ischemia Patient brought for 2 procedures by vascular team on 12/9 and 12/10.  Currently on aspirin, Plavix and Crestor.  Patient does have a blackish first toe, third toe and fifth toe with large area of discoloration on the bottom of the left foot and smaller areas on the left shin with some edema.  Vascular surgery saw patient and cleared to go home with close follow-up in a couple weeks.  Lobar pneumonia (HCC) Patient on Rocephin and Zithromax while here.  Will switch over to doxycycline and Augmentin for another 5 days upon discharge.  Patient did have a groin central line that was removed on 12/15.  Blood cultures negative from 12/12.  Patient afebrile upon discharge.  Essential hypertension Continue Norvasc  Mixed hyperlipidemia Continue Crestor  Chronic obstructive pulmonary disease (COPD) (HCC) As needed nebulizers  Hyponatremia Trial of salt tablets until sodium comes up.  Sodium upon discharge 130.  Recommend checking a BMP and follow-up appointment.  Hypokalemia Replaced during the hospital course  Lower extremity edema Low-dose Lasix as needed.  Obesity (BMI 30-39.9) BMI 36.50  Generalized weakness Physical therapy recommends rehab but patient absolutely refused.  Will set up home health, wheelchair, bedside commode.  Iron deficiency anemia Last hemoglobin 8.1.  Start oral iron         Consultants: Vascular surgery Procedures performed: 2 vascular procedures on 12/9 and 12/10 Disposition: Home health Diet recommendation:  Cardiac diet DISCHARGE MEDICATION:  Allergies as of 03/14/2023       Reactions   Lisinopril-hydrochlorothiazide Other (See Comments)        Medication List     STOP taking these  medications    traMADol 50 MG tablet Commonly known as: ULTRAM       TAKE these medications    acetaminophen 325 MG tablet Commonly known as: TYLENOL Take 2 tablets (650 mg total) by mouth every 6 (six) hours as needed for mild pain (pain score 1-3) (or Fever >/= 101).   albuterol 108 (90 Base) MCG/ACT inhaler Commonly known as: VENTOLIN HFA Inhale 2 puffs into the lungs every 6 (six) hours as needed for wheezing or shortness of breath.   amLODipine 5 MG tablet Commonly known as: NORVASC Take 1 tablet (5 mg total) by mouth daily.   amoxicillin-clavulanate 875-125 MG tablet Commonly known as: AUGMENTIN Take 1 tablet by mouth every 12 (twelve) hours for 5 days. Start taking on: March 15, 2023   aspirin EC 81 MG tablet Take 1 tablet (81 mg total) by mouth daily. Swallow whole. Start taking on: March 15, 2023   clopidogrel 75 MG tablet Commonly known as: Plavix Take 1 tablet (75 mg total) by mouth daily.   doxycycline 100 MG tablet Commonly known as: VIBRA-TABS Take 1 tablet (100 mg total) by mouth every 12 (twelve) hours.   ferrous sulfate 325 (65 FE) MG tablet Take 1 tablet (325 mg total) by mouth daily with breakfast. Start taking on: March 15, 2023   furosemide 20 MG tablet Commonly known as: LASIX Take 1 tablet (20 mg total) by mouth daily as needed for edema.   gabapentin 300 MG capsule Commonly known as: NEURONTIN Take 1 capsule by mouth every morning and take 2 capsules by mouth at bedtime daily   ipratropium-albuterol 0.5-2.5 (3) MG/3ML Soln Commonly known as: DUONEB Take 3 mLs by nebulization every 6 (six) hours as needed.   oxyCODONE 5 MG immediate release tablet Commonly known as: Oxy IR/ROXICODONE Take 1 tablet (5 mg total) by mouth every 6 (six) hours as needed for up to 4 days for severe pain (pain score 7-10).   pantoprazole 40 MG tablet Commonly known as: PROTONIX Take 1 tablet (40 mg total) by mouth every morning.   polyethylene  glycol 17 g packet Commonly known as: MIRALAX / GLYCOLAX Take 17 g by mouth daily.   rosuvastatin 20 MG tablet Commonly known as: CRESTOR Take 1 tablet (20 mg total) by mouth daily.   sodium chloride 1 g tablet Take 1 tablet (1 g total) by mouth 2 (two) times daily with a meal.   Vitamin D (Ergocalciferol) 1.25 MG (50000 UNIT) Caps capsule Commonly known as: DRISDOL Take 1 capsule (50,000 Units total) by mouth every 7 (seven) days.               Durable Medical Equipment  (From admission, onward)           Start     Ordered   03/14/23 1514  For home use only DME lightweight manual wheelchair with seat cushion  Once       Comments: Patient suffers from ischemic foot which impairs their ability to perform daily activities like toileting in the home.  A walker will not resolve  issue with performing activities of daily living. A wheelchair will allow patient to safely perform daily activities. Patient is not able to propel themselves in the home using a standard weight wheelchair due to endurance. Patient can  self propel in the lightweight wheelchair. Length of need 12 months . Accessories: elevating leg rests (ELRs), wheel locks, extensions and anti-tippers.   03/14/23 1515   03/11/23 1201  For home use only DME Walker rolling  Once       Question Answer Comment  Walker: With 5 Inch Wheels   Patient needs a walker to treat with the following condition Generalized weakness      03/11/23 1200   03/11/23 1201  For home use only DME 3 n 1  Once        03/11/23 1200            Follow-up Information     Miki Kins, FNP. Schedule an appointment as soon as possible for a visit in 5 day(s).   Specialty: Family Medicine Contact information: [address] [place] [address] [phone]         Schnier, Latina Craver, MD. Schedule an appointment as soon as possible for a visit in 2 week(s).   Specialties: Vascular Surgery, Cardiology, Radiology, Vascular  Surgery Contact information: [address] [place] [address] [phone]                Discharge Exam: Ceasar Mons Weights   03/10/23 0500 03/12/23 0349 03/13/23 0500  Weight: 78.5 kg 98.6 kg 99.5 kg   Physical Exam HENT:     Head: Normocephalic.  Eyes:     General: Lids are normal.     Conjunctiva/sclera: Conjunctivae normal.  Cardiovascular:     Rate and Rhythm: Normal rate and regular rhythm.     Heart sounds: Normal heart sounds, S1 normal and S2 normal.  Pulmonary:     Breath sounds: Examination of the right-lower field reveals decreased breath sounds. Examination of the left-lower field reveals decreased breath sounds. Decreased breath sounds present. No wheezing, rhonchi or rales.  Abdominal:     Palpations: Abdomen is soft.     Tenderness: There is no abdominal tenderness.  Musculoskeletal:     Right lower leg: No swelling.     Left lower leg: No swelling.  Skin:    Comments: Left foot discoloration first, third and fifth toe, discoloration under the foot and looks like healing scabs on left shin.  Right heel pressure ulcer with bluish blackish discoloration.  Neurological:     Mental Status: She is alert and oriented to person, place, and time.      Condition at discharge: stable  The results of significant diagnostics from this hospitalization (including imaging, microbiology, ancillary and laboratory) are listed below for reference.   Imaging Studies: DG Chest Port 1 View Result Date: 03/10/2023 CLINICAL DATA:  Fever EXAM: PORTABLE CHEST 1 VIEW COMPARISON:  03/19/2022 FINDINGS: Patient is slightly rotated. Heart size is upper limits of normal. Slightly increased interstitial markings at the left lung base. No lobar consolidation. No pleural effusion or pneumothorax. IMPRESSION: Slightly increased interstitial markings at the left lung base, which may reflect atelectasis versus developing infiltrate. Electronically Signed   By: Duanne Guess D.O.   On: 03/10/2023 13:42   PERIPHERAL VASCULAR CATHETERIZATION Result Date: 03/08/2023 See surgical note for result.  PERIPHERAL VASCULAR CATHETERIZATION Result Date: 03/07/2023 See surgical note for result.  CT ANGIO AO+BIFEM W & OR WO CONTRAST Result Date: 03/06/2023 CLINICAL DATA:  Claudication symptoms.  Evaluate for PAD. EXAM: CT ANGIOGRAPHY OF ABDOMINAL AORTA WITH ILIOFEMORAL RUNOFF TECHNIQUE: Multidetector CT imaging of the abdomen, pelvis and lower extremities was performed using the standard protocol  during bolus administration of intravenous contrast. Multiplanar CT image reconstructions and MIPs were obtained to evaluate the vascular anatomy. RADIATION DOSE REDUCTION: This exam was performed according to the departmental dose-optimization program which includes automated exposure control, adjustment of the mA and/or kV according to patient size and/or use of iterative reconstruction technique. CONTRAST:  OMNIPAQUE IOHEXOL 350 MG/ML SOLN COMPARISON:  None Available. FINDINGS: VASCULAR Aorta: Moderate amount of irregular mixed calcified and noncalcified atherosclerotic plaque within a normal caliber abdominal aorta, not resulting in hemodynamically significant stenosis. No evidence of abdominal aortic dissection or perivascular stranding. Celiac: There is a minimal amount of calcified atherosclerotic plaque involving the origin of the celiac artery, not resulting in a hemodynamically significant stenosis. An accessory left hepatic artery is noted to arise from the left gastric artery. SMA: There is a minimal amount of mixed calcified and noncalcified atherosclerotic plaque involving the origin and main trunk of the SMA, not definitely resulting in hemodynamically significant stenosis. The distal tributaries the SMA appear widely patent without discrete intraluminal filling defect to suggest distal embolism. Renals: Left renal artery is duplicated with tiny accessory left renal  artery which supplies the inferior pole of the left kidney. Solitary right renal artery. There is a moderate amount of mixed calcified and noncalcified atherosclerotic plaque involving the origin of the bilateral renal arteries approaching 50% luminal narrowing bilaterally however this finding is without associated delayed renal atrophy or asymmetric renal enhancement. IMA: Remains patent. _________________________________________________________ RIGHT Lower Extremity Inflow: Minimal amount of mixed calcified and noncalcified atherosclerotic plaque within a normal caliber right common iliac artery, not resulting in hemodynamically significant stenosis. The right internal iliac artery is diseased though patent and of normal caliber. There is a moderate amount of mixed calcified and noncalcified atherosclerotic plaque involving the origin of the right external iliac artery resulting in 50% luminal narrowing (image 139, series 13). The remainder of the right external iliac artery is diseased though without a hemodynamically significant narrowing. Outflow: Moderate amount of atherosclerotic plaque within the right common femoral artery, not definitely resulting in hemodynamically significant narrowing. The right deep femoral artery is diseased though of normal caliber and remains patent. There is a moderate amount of slightly irregular mixed calcified and noncalcified atherosclerotic plaque involving the origin and proximal aspect of the right superficial femoral artery with a least 1 portion resulting in at least 50% luminal narrowing (image 40, series 14). The mid and distal aspects of the right superficial femoral artery are diseased though of normal caliber and without hemodynamically significant narrowing. Post stenting of the right above and below-knee popliteal artery. The stent appears widely patent without evidence of in stent stenosis. Runoff: The popliteal arterial stent appears to extend to involve the  origin of the right peroneal artery. Solitary vessel runoff to the lower leg via the peroneal artery which appears to partially reconstitutes the posterior tibial arterial vascular distribution at the level of the calcaneus. A patent right-sided dorsalis pedis artery is not identified. _________________________________________________________ LEFT Lower Extremity Inflow: The left common iliac artery is diseased though patent and of normal caliber and without a hemodynamically significant narrowing. The left internal iliac artery is diseased though patent and of normal caliber. Patient has undergone stenting of the left external iliac artery. The external iliac arterial stent appears widely patent without evidence of in stent stenosis. The remainder of the left external iliac artery is diseased though without a hemodynamically significant narrowing. Outflow: The left common femoral artery is diseased though of normal caliber and  without hemodynamically significant narrowing. The left internal iliac artery is diseased though of normal caliber remains patent. There is a moderate amount of mixed calcified and noncalcified atherosclerotic plaque involving the proximal aspect of the left superficial femoral artery resulting in tandem areas approaching 50% luminal narrowing (representative image 63, series 14). The patient is post stenting of both the distal aspect of the left superficial femoral artery as well as both the left above and below-knee popliteal arteries however all of the stents appear occluded. Runoff: There is a reconstituted atretic single-vessel runoff to the left lower leg via the peroneal artery. There is no significant arterial supply to the left foot below the level of the ankle mortise. Left-sided dorsalis pedis artery is not identified. Veins: The IVC and pelvic venous systems appear widely patent on this arterial phase examination. Review of the MIP images confirms the above findings.  _________________________________________________________ _________________________________________________________ NON-VASCULAR Evaluation of the abdominal organs is limited to the arterial phase of enhancement. Lower chest: Limited visualization of the lower thorax demonstrates minimal dependent subpleural ground-glass atelectasis. No discrete focal airspace opacities. Borderline cardiomegaly. Calcifications involving the mitral valve annulus. Trace amount of pericardial fluid, presumably physiologic. Hepatobiliary: Normal hepatic contour. No discrete hyperenhancing hepatic lesions. Postcholecystectomy. No intra or extrahepatic biliary ductal dilatation. No ascites. Pancreas: Normal appearance of the pancreas. Spleen: Normal appearance of the spleen. Adrenals/Urinary Tract: There is symmetric enhancement of the bilateral kidneys. Note is made of mildly exaggerated fetal lobulation of the bilateral kidneys. No discrete renal lesions. No urinary obstruction or perinephric stranding. Extensive calcifications about the bilateral renal hila are favored to be vascular in etiology. No definite evidence of nephrolithiasis on this postcontrast examination. Mild thickening of the crux of the left adrenal gland without discrete nodule. Normal appearance of the right adrenal gland. Normal appearance of the urinary bladder given degree of distention. Stomach/Bowel: Moderate-to-large colonic stool burden without evidence of enteric obstruction. Colonic diverticulosis without evidence superimposed acute diverticulitis. Normal appearance of the terminal ileum. The appendix is not visualized however there is no pericecal inflammatory change. No significant hiatal hernia. No pneumoperitoneum, pneumatosis or portal venous gas. Lymphatic: Scattered numerous and mildly prominent retroperitoneal and pelvic lymph nodes are individually not enlarged by size criteria with index aortocaval lymph node measuring 0.7 cm in greatest short axis  diameter (image 90, series 13), index right common iliac chain lymph node measuring 0.8 cm (image 118, series 13), and index left pelvic sidewall lymph node measuring 0.8 cm (image 165, series 13), presumably reactive in etiology. No bulky retroperitoneal, mesenteric, pelvic or inguinal lymph adenopathy. Reproductive: Normal appearance of the pelvic organs for age. No free fluid in the pelvic cul-de-sac. Other: Minimal amount of subcutaneous edema about the midline of the low back. Dermal calcifications are noted about the lower legs bilaterally. Musculoskeletal: No acute or aggressive osseous abnormalities. Moderate to severe multilevel lumbar spine DDD, worse at L4-L5 and L5-S1 with disc space height loss, endplate irregularity and sclerosis. Post right total hip replacement without evidence of hardware failure or loosening. IMPRESSION: Vascular Impression: 1. Scattered atherosclerotic plaque within a normal caliber abdominal aorta, not resulting in a hemodynamically significant narrowing. Aortic Atherosclerosis (ICD10-I70.0). 2. Suspected hemodynamically significant narrowing involving the origin of the bilateral renal arteries without associated delayed renal enhancement or asymmetric renal atrophy. Vascular Impression of the left lower extremity: 1. Post stenting of the left external iliac artery without evidence of in stent stenosis or residual hemodynamically significant narrowing. 2. Approximately 50% luminal narrowing involving the uncovered portions  of the proximal aspect of the left superficial femoral artery. 3. Left SFA and popliteal arterial stents are occluded. 4. Solitary single-vessel runoff to the left lower leg via a reconstituted atretic peroneal artery. There is no significant arterial supply to the left foot below the level of the ankle mortise. Left-sided dorsalis pedis artery is not identified. No definite evidence of distal embolism. Vascular Impression of the right lower extremity: 1.  Approximately 50% luminal narrowing involving the origin of the right external iliac artery. 2. Irregular mixed calcified and noncalcified atherosclerotic plaque results in tandem areas of at least 50% luminal narrowing involving the proximal aspect of the right superficial femoral artery. The mid and distal aspects of the right superficial femoral artery are without hemodynamically significant narrowing. 3. Post stenting of the right above and below-knee popliteal artery. The stents appears widely patent without evidence of in stent stenosis. 4. Solitary vessel runoff to the right lower leg via the peroneal artery which appears to partially reconstitute the posterior tibial arterial vascular distribution at the level of the calcaneus. No definite evidence of distal embolism. Nonvascular Impression: 1. Colonic diverticulosis without evidence superimposed acute diverticulitis. Electronically Signed   By: Simonne Come M.D.   On: 03/06/2023 15:35   US Venous Img Lower  Left (DVT Study) Result Date: 03/06/2023 CLINICAL DATA:  Leg swelling for 5 days. EXAM: LEFT LOWER EXTREMITY VENOUS DOPPLER ULTRASOUND TECHNIQUE: Gray-scale sonography with compression, as well as color and duplex ultrasound, were performed to evaluate the deep venous system(s) from the level of the common femoral vein through the popliteal and proximal calf veins. COMPARISON:  None Available. FINDINGS: VENOUS Normal compressibility of the common femoral, superficial femoral, and popliteal veins, as well as the visualized calf veins. Visualized portions of profunda femoral vein and great saphenous vein unremarkable. No filling defects to suggest DVT on grayscale or color Doppler imaging. Doppler waveforms show normal direction of venous flow, normal respiratory plasticity and response to augmentation. Limited views of the contralateral common femoral vein are unremarkable. OTHER A fluid collection in the left popliteal fossa measures 3.2 x 0.7 x 0.9  cm. Limitations: none IMPRESSION: 1. No evidence of deep venous thrombosis in the left lower extremity. 2. Fluid collection in the left popliteal fossa may represent a Baker's cyst. Electronically Signed   By: Romona Curls M.D.   On: 03/06/2023 11:25   DG Knee Complete 4 Views Right Result Date: 03/06/2023 CLINICAL DATA:  Right knee pain. Palpable knot to medial towards lateral aspect. EXAM: RIGHT KNEE - COMPLETE 4+ VIEW COMPARISON:  None Available. FINDINGS: The mineralization and alignment are normal. There is no evidence of acute fracture or dislocation. There are age advanced tricompartmental degenerative changes with joint space narrowing and osteophytes. There is moderate size knee joint effusion without evidence of lipohemarthrosis. Popliteal stent noted with diffuse vascular calcifications. No apparent soft tissue mass. IMPRESSION: Age advanced tricompartmental degenerative changes with moderate size knee joint effusion. No acute osseous findings. Electronically Signed   By: Carey Bullocks M.D.   On: 03/06/2023 10:40   DG Foot Complete Left Result Date: 03/06/2023 CLINICAL DATA:  67 year old female status post fall from bed.  Pain. EXAM: LEFT FOOT - COMPLETE 3+ VIEW COMPARISON:  None Available. FINDINGS: Bone mineralization is within normal limits for age. Mild pes planus. Calcaneus appears intact. Mostly normal joint spaces and alignment for age. There is mild degenerative spurring at the dorsal navicular. Small cuboid accessory ossicle. Chronic deformity of the 1st distal phalanx. Distal metatarsal  region soft tissue swelling. No acute fracture or dislocation identified. Dystrophic soft tissue calcification at the ankle. No evidence of ankle joint effusion. IMPRESSION: Distal metatarsal region soft tissue swelling. No acute fracture or dislocation identified in the left foot. Electronically Signed   By: Odessa Fleming M.D.   On: 03/06/2023 09:53    Microbiology: Results for orders placed or performed  during the hospital encounter of 03/06/23  MRSA Next Gen by PCR, Nasal     Status: None   Collection Time: 03/07/23  6:06 PM   Specimen: Nasal Mucosa; Nasal Swab  Result Value Ref Range Status   MRSA by PCR Next Gen NOT DETECTED NOT DETECTED Final    Comment: (NOTE) The GeneXpert MRSA Assay (FDA approved for NASAL specimens only), is one component of a comprehensive MRSA colonization surveillance program. It is not intended to diagnose MRSA infection nor to guide or monitor treatment for MRSA infections. Test performance is not FDA approved in patients less than 58 years old. Performed at [facility], [address] [place], [place] [address]   Culture, blood (Routine X 2) w Reflex to ID Panel     Status: None (Preliminary result)   Collection Time: 03/10/23 12:17 PM   Specimen: BLOOD  Result Value Ref Range Status   Specimen Description BLOOD RIGHT ANTECUBITAL  Final   Special Requests   Final    BOTTLES DRAWN AEROBIC AND ANAEROBIC Blood Culture results may not be optimal due to an excessive volume of blood received in culture bottles   Culture   Final    NO GROWTH 4 DAYS Performed at [facility], [address] [place], [place] [address]    Report Status PENDING  Incomplete  Culture, blood (Routine X 2) w Reflex to ID Panel     Status: None (Preliminary result)   Collection Time: 03/10/23 12:25 PM   Specimen: BLOOD  Result Value Ref Range Status   Specimen Description BLOOD BLOOD RIGHT HAND  Final   Special Requests   Final    BOTTLES DRAWN AEROBIC AND ANAEROBIC Blood Culture results may not be optimal due to an inadequate volume of blood received in culture bottles   Culture   Final    NO GROWTH 4 DAYS Performed at [facility], [address] [place], [place] [address]    Report Status PENDING  Incomplete    Labs: CBC: Recent Labs  Lab 03/08/23 1126 03/09/23 0419 03/10/23 1036 03/11/23 1235 03/13/23 0706  WBC 9.6 11.5* 13.1*  16.3* 12.3*  HGB 10.3* 9.6* 9.8* 8.3* 8.1*  HCT 29.9* 28.8* 29.3* 24.0* 23.8*  MCV 95.8 99.0 98.3 95.6 97.1  PLT 201 190 195 200 257   Basic Metabolic Panel: Recent Labs  Lab 03/08/23 0608 03/09/23 0419 03/10/23 1036 03/11/23 0555 03/12/23 0244 03/13/23 0706  NA 134* 133* 132* 132* 128* 130*  K 3.1* 3.0* 3.4* 3.5 3.2* 3.7  CL 100 101 99 101 97* 98  CO2 25 24 26 25 23 23   GLUCOSE 111* 145* 150* 130* 100* 107*  BUN 9 14 13 13 13 12   CREATININE 0.62 0.85 0.85 0.81 0.67 0.70  CALCIUM 8.5* 8.2* 8.1* 8.1* 7.9* 8.2*  MG 1.7 1.7 2.0 1.9 1.9  --    Liver Function Tests: No results for input(s): "AST", "ALT", "ALKPHOS", "BILITOT", "PROT", "ALBUMIN" in the last 168 hours. CBG: No results for input(s): "GLUCAP" in the last 168 hours.  Discharge time spent: greater than 30 minutes.  Signed: Gerlene Burdock  Renae Gloss, MD [facility] 03/14/2023

## 2023-03-14 NOTE — Progress Notes (Signed)
Physical Therapy Treatment Patient Details Name: Jackie Macias MRN: [id number] DOB: [birth year] Today's Date: 03/14/2023   History of Present Illness Pt is a 67 year old female admitted with Acute critical lower limb ischemia, left  Atherosclerotic occlusive disease bilateral lower extremities 03/07/23 received thrombectomy with initiation of thrombolysis, 12/10  received angioplasty and stent placement to SFA with Successful recanalization left lower extremity for limb salvage.      PMH significant for PAD s/p angiography (most recent June 2024), hypertension, hyperlipidemia, bilateral carotid artery disease, COPD,    PT Comments  Responded to chair alarm in room; patient noted to have scooted recliner from [facility] to bathroom and transferred self to toilet for toileting/hygiene needs.  Assisted with clothing change, hygiene, upper/lower body dressing and return to chair to maintain patient safety.  Completing all set set sup/supervision; limited willingness to allow therapist to assist, insistent that she complete as indep as possible.    If plan is discharge home, recommend the following: Help with stairs or ramp for entrance;Assist for transportation;Assistance with cooking/housework;A lot of help with walking and/or transfers;A lot of help with bathing/dressing/bathroom   Can travel by private vehicle     No  Equipment Recommendations  Wheelchair (measurements PT);Wheelchair cushion (measurements PT) (DABSC)    Recommendations for Other Services       Precautions / Restrictions Precautions Precautions: Fall Restrictions Weight Bearing Restrictions Per Provider Order: No     Mobility  Bed Mobility Overal bed mobility: Needs Assistance Bed Mobility: Supine to Sit     Supine to sit: Min assist, Mod assist          Transfers Overall transfer level: Needs assistance   Transfers: Bed to chair/wheelchair/BSC       Squat pivot transfers: Min assist    Lateral/Scoot  Transfers: Min assist General transfer comment: to/from BSC/toilet, recliner, squat pivot    Ambulation/Gait               General Gait Details: deferred   Stairs             Wheelchair Mobility     Tilt Bed    Modified Rankin (Stroke Patients Only)       Balance Overall balance assessment: Needs assistance, History of Falls Sitting-balance support: Feet supported Sitting balance-Leahy Scale: Good                                      Cognition Arousal: Alert Behavior During Therapy: WFL for tasks assessed/performed, Impulsive Overall Cognitive Status: Within Functional Limits for tasks assessed                           Safety/Judgement: Decreased awareness of safety, Decreased awareness of deficits     General Comments: intermittently confused to higher-level concepts        Exercises Other Exercises Other Exercises: Squat pivot transfer, to/from toilet, sup/mod indep (patient already on toilet upon arrival to room); hygiene, upper and lower body bathing (sponge bath) while seated on toilet, set sup/supervision.    General Comments        Pertinent Vitals/Pain Pain Assessment Pain Assessment: No/denies pain Pain Score: 0-No pain    Home Living                          Prior Function  PT Goals (current goals can now be found in the care plan section) Acute Rehab PT Goals Patient Stated Goal: to return home PT Goal Formulation: With patient Time For Goal Achievement: 03/23/23 Potential to Achieve Goals: Fair Progress towards PT goals: Progressing toward goals    Frequency    Min 1X/week      PT Plan      Co-evaluation              AM-PAC PT "6 Clicks" Mobility   Outcome Measure  Help needed turning from your back to your side while in a flat bed without using bedrails?: A Little Help needed moving from lying on your back to sitting on the side of a flat bed without using  bedrails?: A Little Help needed moving to and from a bed to a chair (including a wheelchair)?: A Little Help needed standing up from a chair using your arms (e.g., wheelchair or bedside chair)?: A Little Help needed to walk in hospital room?: Total Help needed climbing 3-5 steps with a railing? : Total 6 Click Score: 14    End of Session   Activity Tolerance: Patient tolerated treatment well Patient left: in chair;with call bell/phone within reach;with chair alarm set Nurse Communication: Mobility status PT Visit Diagnosis: Other abnormalities of gait and mobility (R26.89);Pain;Muscle weakness (generalized) (M62.81);Difficulty in walking, not elsewhere classified (R26.2) Pain - Right/Left: Left Pain - part of body: Leg     Time: 1610-9604 PT Time Calculation (min) (ACUTE ONLY): 30 min  Charges:    $Therapeutic Activity: 23-37 mins PT General Charges $$ ACUTE PT VISIT: 1 Visit                     Tywone Bembenek H. Manson Passey, PT, DPT, NCS 03/14/23, 3:48 PM [phone]

## 2023-03-14 NOTE — Progress Notes (Signed)
Pt states she has power wheelchair at home that the caregiver will bring down to the car for her to get in. Asked the pt if she knew whether it was charged and ready to use and she states it is.

## 2023-03-14 NOTE — Progress Notes (Signed)
Progress Note    03/14/2023 1:01 PM 6 Days Post-Op  Subjective:   Jackie Macias is a 67 yo female now POD #2 from left lower extremity angioplasty and stent placement of the left superficial femoral and popliteal arteries. Patient resting comfortably in bed. Endorses some left foot pain but its better than it was. Endorses her entire leg feels warmer. No complications overnight and vitals all remain stable    Vitals:   03/14/23 0331 03/14/23 0824  BP: (!) 107/53 123/73  Pulse: 66 77  Resp: 18 17  Temp: 98.3 F (36.8 C) 98.8 F (37.1 C)  SpO2: 98% 92%   Physical Exam: Cardiac:  RRR, Normal S1, S2, No murmur Lungs:  Coarse on auscultation. Diminished in the bases. No rales or wheezing Incisions:  right groin with puncture for catheter placement. Dressing clean dry and intact. No hematoma or seroma to note.  Extremities:  Left lower extremity warm to touch, Positive doppler DP/PT pulses. Patient able to wiggle toes.  Areas of skin darkening to the toes as well as +2 edema to her foot most likely related to reperfusion syndrome.  No noted skin breakdown at this time. Abdomen:  Positive bowel sounds throughout, soft, non tender and non distended.  Neurologic: AAOX4, answers all questions and follows commands appropriately.    CBC    Component Value Date/Time   WBC 12.3 (H) 03/13/2023 0706   RBC 2.45 (L) 03/13/2023 0706   HGB 8.1 (L) 03/13/2023 0706   HCT 23.8 (L) 03/13/2023 0706   PLT 257 03/13/2023 0706   MCV 97.1 03/13/2023 0706   MCH 33.1 03/13/2023 0706   MCHC 34.0 03/13/2023 0706   RDW 13.9 03/13/2023 0706   LYMPHSABS 2.7 03/06/2023 0844   MONOABS 1.1 (H) 03/06/2023 0844   EOSABS 0.0 03/06/2023 0844   BASOSABS 0.0 03/06/2023 0844    BMET    Component Value Date/Time   NA 130 (L) 03/13/2023 0706   NA 138 11/22/2022 1042   K 3.7 03/13/2023 0706   CL 98 03/13/2023 0706   CO2 23 03/13/2023 0706   GLUCOSE 107 (H) 03/13/2023 0706   BUN 12 03/13/2023 0706   BUN 18  11/22/2022 1042   CREATININE 0.70 03/13/2023 0706   CALCIUM 8.2 (L) 03/13/2023 0706   GFRNONAA >60 03/13/2023 0706   GFRAA >60 09/15/2018 1742    INR    Component Value Date/Time   INR 1.04 01/05/2017 1328    No intake or output data in the 24 hours ending 03/14/23 1301   Assessment/Plan:  67 y.o. female is s/p left lower extremity angioplasty and stent placement of the left superficial femoral and popliteal arteries.   6 Days Post-Op   PLAN: Patient is now postop day 6 from left lower extremity angioplasty with stent placement left superficial femoral artery and popliteal artery.  She is noted to have some skin changes with darkening areas as well as +2 noted edema.  This most likely from reperfusion.  If the patient does have future skin breakdown with necrotic or gangrene areas most likely related to prior ischemic event which was revascularized at this hospitalization.  Patient is noted to have strong Doppler PT and DP pulses. Per vascular surgery okay for patient to discharge and follow-up in clinic with vein and vascular in 3 weeks with ultrasounds and ABIs of her left lower extremity. Patient will need to go home on aspirin 81 mg daily Plavix 75 mg daily and continue her Crestor 20 mg daily.  DVT prophylaxis:  ASA 81 mg daily Plavix 75 mg daily and Crestor 20 mg Daily and on discharge    [name] [facility] 03/14/2023 1:01 PM

## 2023-03-14 NOTE — Assessment & Plan Note (Signed)
Physical therapy recommends rehab but patient absolutely refused.  Will set up home health, wheelchair, bedside commode.

## 2023-03-14 NOTE — TOC Transition Note (Signed)
Transition of Care [facility]) - Discharge Note   Patient Details  Name: Jackie Macias MRN: [id number] Date of Birth: 12/07/1955  Transition of Care [facility]) CM/SW Contact:  Garret Reddish, RN Phone Number: 03/14/2023, 4:16 PM   Clinical Narrative:    Chart reviewed.  Noted that patient has orders for discharge today.   I have spoken with Kandee Keen with [facility] and informed him that patient will be going home today.  [facility] will provide HH RN, PT, OT, and in home aide.    I have asked Cletis Athens with [employer] to provide patient will a light weight wheelchair, Rolling walker, and drop down arm bedside commode.  Patient has requested DME be delivered to her home.  [name] reports that her caregiver will transport her home today.    I have informed staff nurse of the above information.     Final next level of care: Home w Home Health Services Barriers to Discharge: No Barriers Identified   Patient Goals and CMS Choice   CMS Medicare.gov Compare Post Acute Care list provided to:: Patient Choice offered to / list presented to : Patient      Discharge Placement                    Patient and family notified of of transfer: 03/14/23  Discharge Plan and Services Additional resources added to the After Visit Summary for                  DME Arranged: Wheelchair manual, Walker rolling (Drop down [facility],) DME Agency: [employer] Date DME Agency Contacted: 03/14/23 Time DME Agency Contacted: 1600 Representative spoke with at DME Agency: Cletis Athens HH Arranged: RN, PT, OT, Nurse's Aide HH Agency: [facility]     Representative spoke with at [facility] Agency: Kandee Keen  Social Drivers of Health (SDOH) Interventions SDOH Screenings   Food Insecurity: No Food Insecurity (03/07/2023)  Housing: High Risk (03/11/2023)  Transportation Needs: No Transportation Needs (03/07/2023)  Utilities: Not At Risk (03/07/2023)  Tobacco Use: High Risk (11/30/2022)   Received from [facility]      Readmission Risk Interventions     No data to display

## 2023-03-14 NOTE — Progress Notes (Addendum)
Physical Therapy Treatment Patient Details Name: Jackie Macias MRN: [id number] DOB: 01-Apr-1955 Today's Date: 03/14/2023   History of Present Illness Pt is a 67 year old female admitted with Acute critical lower limb ischemia, left  Atherosclerotic occlusive disease bilateral lower extremities 03/07/23 received thrombectomy with initiation of thrombolysis, 12/10  received angioplasty and stent placement to SFA with Successful recanalization left lower extremity for limb salvage.      PMH significant for PAD s/p angiography (most recent June 2024), hypertension, hyperlipidemia, bilateral carotid artery disease, COPD,    PT Comments  Noting persistent refusal of STR; shifted emphasis on session to assess safety/indep with functional transfers at lower level (WC level) if patient does indeed transfer home. Able to complete scoot/squat pivot with no greater than min assist; frequent cuing for sequencing, set sup.  Does require bilat UE support throughout; optimal safety/indep with scoot pivot over level surfaces. Do anticipate some difficulty generalizing technique to different seating surfaces and environmental set-ups.  Encouraged patient/CNA to practice using BSC for toileting needs.  Patient does endorse access to a power WC/scooter (friends) in a single-story home with ramp access; will verify as appropriate. If continues to pursue home, recommend patient maintain WC level at this time; may benefit from manual WC with ELRs and cushion, DABSC and max HH services (PT, OT, RN, aide) to support transition.      If plan is discharge home, recommend the following: Help with stairs or ramp for entrance;Assist for transportation;Assistance with cooking/housework;A lot of help with walking and/or transfers;A lot of help with bathing/dressing/bathroom   Can travel by private vehicle     No  Equipment Recommendations  Wheelchair (measurements PT);Wheelchair cushion (measurements PT) (Manual WC with  ELRs, DABSC)    Recommendations for Other Services       Precautions / Restrictions Precautions Precautions: Fall Restrictions Weight Bearing Restrictions Per Provider Order: No     Mobility  Bed Mobility Overal bed mobility: Needs Assistance Bed Mobility: Supine to Sit     Supine to sit: Min assist, Mod assist          Transfers Overall transfer level: Needs assistance   Transfers: Bed to chair/wheelchair/BSC       Squat pivot transfers: Min assist    Lateral/Scoot Transfers: Min assist      Ambulation/Gait               General Gait Details: deferred   Stairs             Wheelchair Mobility     Tilt Bed    Modified Rankin (Stroke Patients Only)       Balance Overall balance assessment: Needs assistance, History of Falls Sitting-balance support: Feet supported Sitting balance-Leahy Scale: Good                                      Cognition Arousal: Alert Behavior During Therapy: WFL for tasks assessed/performed Overall Cognitive Status: Within Functional Limits for tasks assessed                                 General Comments: intermittently confused to higher-level concepts        Exercises Other Exercises Other Exercises: Bed/chair transfer: cga/min assist for scoot pivot over level surfaces; min assist for squat pivot over armrests.  Does requires UE support to  complete; limited active use of L LE for WBing/stabilization.  Min cuing/assist to problem-solve transfer (anticipate difficulty generalizing to different surfaces/set ups)    General Comments        Pertinent Vitals/Pain Pain Assessment Pain Assessment: 0-10 Pain Score: 0-No pain    Home Living                          Prior Function            PT Goals (current goals can now be found in the care plan section) Acute Rehab PT Goals Patient Stated Goal: to return home PT Goal Formulation: With patient Time For  Goal Achievement: 03/23/23 Potential to Achieve Goals: Fair Progress towards PT goals: Progressing toward goals    Frequency    Min 1X/week      PT Plan      Co-evaluation              AM-PAC PT "6 Clicks" Mobility   Outcome Measure  Help needed turning from your back to your side while in a flat bed without using bedrails?: A Little Help needed moving from lying on your back to sitting on the side of a flat bed without using bedrails?: A Little Help needed moving to and from a bed to a chair (including a wheelchair)?: A Little Help needed standing up from a chair using your arms (e.g., wheelchair or bedside chair)?: A Little Help needed to walk in hospital room?: Total Help needed climbing 3-5 steps with a railing? : Total 6 Click Score: 14    End of Session   Activity Tolerance: Patient tolerated treatment well Patient left: in chair;with call bell/phone within reach;with chair alarm set Nurse Communication: Mobility status PT Visit Diagnosis: Other abnormalities of gait and mobility (R26.89);Pain;Muscle weakness (generalized) (M62.81);Difficulty in walking, not elsewhere classified (R26.2) Pain - Right/Left: Left Pain - part of body: Leg     Time: 1040-1107 PT Time Calculation (min) (ACUTE ONLY): 27 min  Charges:    $Therapeutic Activity: 23-37 mins PT General Charges $$ ACUTE PT VISIT: 1 Visit                    Karysa Heft H. Manson Passey, PT, DPT, NCS 03/14/23, 12:59 PM [phone]

## 2023-03-14 NOTE — Progress Notes (Signed)
Patient is not able to walk the distance required to go the bathroom, or he/she is unable to safely negotiate stairs required to access the bathroom.  A 3in1 BSC will alleviate this problem  

## 2023-03-14 NOTE — Care Management Important Message (Signed)
Important Message  Patient Details  Name: Jackie Macias MRN: [id number] Date of Birth: June 06, 1955   Important Message Given:  Yes - Medicare IM     Verita Schneiders Arland Usery 03/14/2023, 2:39 PM

## 2023-03-15 LAB — CULTURE, BLOOD (ROUTINE X 2)
Culture: NO GROWTH
Culture: NO GROWTH

## 2023-03-22 ENCOUNTER — Telehealth (INDEPENDENT_AMBULATORY_CARE_PROVIDER_SITE_OTHER): Payer: Self-pay

## 2023-03-22 NOTE — Telephone Encounter (Signed)
Patient left a message stating that she has left leg swelling since the left le angio. I left a message on the patient voicemail to return call to receive more information.

## 2023-03-31 ENCOUNTER — Ambulatory Visit: Payer: Medicare HMO | Admitting: Family

## 2023-03-31 ENCOUNTER — Encounter: Payer: Self-pay | Admitting: Family

## 2023-03-31 VITALS — BP 110/60 | HR 97 | Ht 65.0 in | Wt 167.0 lb

## 2023-03-31 DIAGNOSIS — Z0130 Encounter for examination of blood pressure without abnormal findings: Secondary | ICD-10-CM

## 2023-03-31 DIAGNOSIS — I998 Other disorder of circulatory system: Secondary | ICD-10-CM

## 2023-03-31 DIAGNOSIS — I7025 Atherosclerosis of native arteries of other extremities with ulceration: Secondary | ICD-10-CM

## 2023-03-31 DIAGNOSIS — I70244 Atherosclerosis of native arteries of left leg with ulceration of heel and midfoot: Secondary | ICD-10-CM

## 2023-03-31 DIAGNOSIS — I70213 Atherosclerosis of native arteries of extremities with intermittent claudication, bilateral legs: Secondary | ICD-10-CM

## 2023-03-31 MED ORDER — OXYCODONE HCL 5 MG PO TABS: 5.0000 mg | ORAL_TABLET | ORAL | 0 refills | Status: DC | PRN

## 2023-03-31 NOTE — Assessment & Plan Note (Signed)
 Setting up home health to come out to help with her wound care and with strength and endurance.  Patient has follow up visit already scheduled with Vascular.   Will suggest wet-to-dry dressings in the interim, until we have a more definitive plan of care.

## 2023-03-31 NOTE — Assessment & Plan Note (Signed)
Patient is seen by Vascular, who manage this condition.  She is well controlled with current therapy.   Will defer to them for further changes to plan of care.

## 2023-03-31 NOTE — Progress Notes (Signed)
 Established Patient Office Visit  Subjective:  Patient ID: Jackie Macias, female    DOB: 08-10-1955  Age: 68 y.o. MRN: [id number]  Chief Complaint  Patient presents with   Follow-up    Hospital follow up    Patient is here for follow up after her recent hospitalization.  She has a wound on her left lower leg, from her peripheral vascular disease, and had stents put into her leg. She has a follow up appointment next week with vascular, but she has not had any follow up since discharge, despite needing care for her foot.  She was discharged on 12/16.   She has extensive wounds to her left foot, ischemia has clearly affected the tissues.  I believe that she would benefit from home health care and from referral to the wound clinic.   She is still having severe pain in the foot, asks if she can have a refill on her medication from the hospital for pain, is only really using it at night when she lies down.    No other concerns at this time.   Past Medical History:  Diagnosis Date   Arthritis    Bronchitis    COPD (chronic obstructive pulmonary disease) (HCC)    GERD (gastroesophageal reflux disease)    Gout    Hypertension     Past Surgical History:  Procedure Laterality Date   AMPUTATION Right 07/29/2017   Procedure: AMPUTATION DIGIT/ 71174;  Surgeon: Neill Boas, DPM;  Location: [facility];  Service: Podiatry;  Laterality: Right;   AMPUTATION TOE Right 02/18/2021   Procedure: AMPUTATION TOE-GREAT TOE;  Surgeon: Ashley Soulier, DPM;  Location: [facility];  Service: Podiatry;  Laterality: Right;   CHOLECYSTECTOMY     facial biopsy Right    JOINT REPLACEMENT Right 12/2016   THR   LOWER EXTREMITY ANGIOGRAPHY Right 09/27/2017   Procedure: LOWER EXTREMITY ANGIOGRAPHY;  Surgeon: Jama Cordella MATSU, MD;  Location: [facility];  Service: Cardiovascular;  Laterality: Right;   LOWER EXTREMITY ANGIOGRAPHY Right 06/17/2020   Procedure: LOWER EXTREMITY ANGIOGRAPHY;  Surgeon: Jama Cordella MATSU, MD;  Location: [facility];  Service: Cardiovascular;  Laterality: Right;   LOWER EXTREMITY ANGIOGRAPHY Right 02/18/2021   Procedure: LOWER EXTREMITY ANGIOGRAPHY;  Surgeon: Jama Cordella MATSU, MD;  Location: [facility];  Service: Cardiovascular;  Laterality: Right;   LOWER EXTREMITY ANGIOGRAPHY Left 09/07/2022   Procedure: Lower Extremity Angiography;  Surgeon: Jama Cordella MATSU, MD;  Location: [facility];  Service: Cardiovascular;  Laterality: Left;   LOWER EXTREMITY ANGIOGRAPHY Left 03/07/2023   Procedure: Lower Extremity Angiography;  Surgeon: Marea Selinda RAMAN, MD;  Location: [facility];  Service: Cardiovascular;  Laterality: Left;   LOWER EXTREMITY ANGIOGRAPHY Left 03/08/2023   Procedure: Lower Extremity Angiography;  Surgeon: Jama Cordella MATSU, MD;  Location: [facility];  Service: Cardiovascular;  Laterality: Left;   TONSILLECTOMY     TOTAL HIP ARTHROPLASTY Right 01/17/2017   Procedure: TOTAL HIP ARTHROPLASTY ANTERIOR APPROACH;  Surgeon: Leora Lynwood SAUNDERS, MD;  Location: [facility];  Service: Orthopedics;  Laterality: Right;    Social History   Socioeconomic History   Marital status: Single    Spouse name: Not on file   Number of children: Not on file   Years of education: Not on file   Highest education level: Not on file  Occupational History   Not on file  Tobacco Use   Smoking status: Every Day  Current packs/day: 1.00    Average packs/day: 1 pack/day for 50.0 years (50.0 ttl pk-yrs)    Types: Cigarettes   Smokeless tobacco: Never  Vaping Use   Vaping status: Never Used  Substance and Sexual Activity   Alcohol use: Yes    Alcohol/week: 3.0 standard drinks of alcohol    Types: 3 Cans of beer per week    Comment: occasional   Drug use: No   Sexual activity: Not on file  Other Topics Concern   Not on file  Social History Narrative   Not on file   Social Drivers of Health   Financial Resource Strain: Not on file   Food Insecurity: No Food Insecurity (03/07/2023)   Hunger Vital Sign    Worried About Running Out of Food in the Last Year: Never true    Ran Out of Food in the Last Year: Never true  Transportation Needs: No Transportation Needs (03/07/2023)   PRAPARE - Administrator, Civil Service (Medical): No    Lack of Transportation (Non-Medical): No  Physical Activity: Not on file  Stress: Not on file  Social Connections: Not on file  Intimate Partner Violence: Not At Risk (03/07/2023)   Humiliation, Afraid, Rape, and Kick questionnaire    Fear of Current or Ex-Partner: No    Emotionally Abused: No    Physically Abused: No    Sexually Abused: No    Family History  Problem Relation Age of Onset   Breast cancer Sister 85   Breast cancer Other     Allergies  Allergen Reactions   Lisinopril-Hydrochlorothiazide Other (See Comments)    Review of Systems  Skin:        Wound on left foot  All other systems reviewed and are negative.      Objective:   BP 110/60   Pulse 97   Ht 5' 5 (1.651 m)   Wt 167 lb (75.8 kg)   SpO2 99%   BMI 27.79 kg/m   Vitals:   03/31/23 1414  BP: 110/60  Pulse: 97  Height: 5' 5 (1.651 m)  Weight: 167 lb (75.8 kg)  SpO2: 99%  BMI (Calculated): 27.79    Physical Exam Skin:    Findings: Wound present.           No results found for any visits on 03/31/23.  Recent Results (from the past 2160 hours)  CBC with Differential     Status: Abnormal   Collection Time: 03/06/23  8:44 AM  Result Value Ref Range   WBC 10.8 (H) 4.0 - 10.5 K/uL   RBC 3.78 (L) 3.87 - 5.11 MIL/uL   Hemoglobin 12.6 12.0 - 15.0 g/dL   HCT 62.1 63.9 - 53.9 %   MCV 100.0 80.0 - 100.0 fL   MCH 33.3 26.0 - 34.0 pg   MCHC 33.3 30.0 - 36.0 g/dL   RDW 86.5 88.4 - 84.4 %   Platelets 306 150 - 400 K/uL   nRBC 0.0 0.0 - 0.2 %   Neutrophils Relative % 63 %   Neutro Abs 6.8 1.7 - 7.7 K/uL   Lymphocytes Relative 25 %   Lymphs Abs 2.7 0.7 - 4.0 K/uL   Monocytes  Relative 11 %   Monocytes Absolute 1.1 (H) 0.1 - 1.0 K/uL   Eosinophils Relative 0 %   Eosinophils Absolute 0.0 0.0 - 0.5 K/uL   Basophils Relative 0 %   Basophils Absolute 0.0 0.0 - 0.1 K/uL   Immature Granulocytes 1 %  Abs Immature Granulocytes 0.05 0.00 - 0.07 K/uL    Comment: Performed at [facility], [address] [place], [place] [address]  Comprehensive metabolic panel     Status: Abnormal   Collection Time: 03/06/23  8:44 AM  Result Value Ref Range   Sodium 134 (L) 135 - 145 mmol/L   Potassium 3.8 3.5 - 5.1 mmol/L   Chloride 100 98 - 111 mmol/L   CO2 23 22 - 32 mmol/L   Glucose, Bld 110 (H) 70 - 99 mg/dL    Comment: Glucose reference range applies only to samples taken after fasting for at least 8 hours.   BUN 12 8 - 23 mg/dL   Creatinine, Ser 9.28 0.44 - 1.00 mg/dL   Calcium  9.1 8.9 - 10.3 mg/dL   Total Protein 8.0 6.5 - 8.1 g/dL   Albumin 3.7 3.5 - 5.0 g/dL   AST 42 (H) 15 - 41 U/L   ALT 19 0 - 44 U/L   Alkaline Phosphatase 70 38 - 126 U/L   Total Bilirubin 0.7 <1.2 mg/dL   GFR, Estimated >39 >39 mL/min    Comment: (NOTE) Calculated using the CKD-EPI Creatinine Equation (2021)    Anion gap 11 5 - 15    Comment: Performed at [facility], [address] [place], [place] [address]  Heparin  level (unfractionated)     Status: None   Collection Time: 03/07/23 12:47 AM  Result Value Ref Range   Heparin  Unfractionated 0.35 0.30 - 0.70 IU/mL    Comment: (NOTE) The clinical reportable range upper limit is being lowered to >1.10 to align with the FDA approved guidance for the current laboratory assay.  If heparin  results are below expected values, and patient dosage has  been confirmed, suggest follow up testing of antithrombin III levels. Performed at [facility], [address] [place], [place] [address]   Basic metabolic panel     Status: Abnormal   Collection Time: 03/07/23  7:36 AM  Result Value Ref Range   Sodium 135 135 -  145 mmol/L   Potassium 3.5 3.5 - 5.1 mmol/L   Chloride 103 98 - 111 mmol/L   CO2 21 (L) 22 - 32 mmol/L   Glucose, Bld 123 (H) 70 - 99 mg/dL    Comment: Glucose reference range applies only to samples taken after fasting for at least 8 hours.   BUN 13 8 - 23 mg/dL   Creatinine, Ser 9.32 0.44 - 1.00 mg/dL   Calcium  8.9 8.9 - 10.3 mg/dL   GFR, Estimated >39 >39 mL/min    Comment: (NOTE) Calculated using the CKD-EPI Creatinine Equation (2021)    Anion gap 11 5 - 15    Comment: Performed at [facility], [address] [place], [place] [address]  CBC     Status: Abnormal   Collection Time: 03/07/23  7:36 AM  Result Value Ref Range   WBC 8.7 4.0 - 10.5 K/uL   RBC 3.44 (L) 3.87 - 5.11 MIL/uL   Hemoglobin 11.5 (L) 12.0 - 15.0 g/dL   HCT 66.7 (L) 63.9 - 53.9 %   MCV 96.5 80.0 - 100.0 fL   MCH 33.4 26.0 - 34.0 pg   MCHC 34.6 30.0 - 36.0 g/dL   RDW 86.6 88.4 - 84.4 %   Platelets 257 150 - 400 K/uL   nRBC 0.0 0.0 - 0.2 %    Comment: Performed at [facility], [address] [place], [place] [address]  Heparin   level (unfractionated)     Status: None   Collection Time: 03/07/23  7:36 AM  Result Value Ref Range   Heparin  Unfractionated 0.34 0.30 - 0.70 IU/mL    Comment: (NOTE) The clinical reportable range upper limit is being lowered to >1.10 to align with the FDA approved guidance for the current laboratory assay.  If heparin  results are below expected values, and patient dosage has  been confirmed, suggest follow up testing of antithrombin III levels. Performed at [facility], [address] [place], [place] [address]   Heparin  level (unfractionated) every 6 hours x 4 post-procedure     Status: Abnormal   Collection Time: 03/07/23  2:36 PM  Result Value Ref Range   Heparin  Unfractionated 0.77 (H) 0.30 - 0.70 IU/mL    Comment: (NOTE) The clinical reportable range upper limit is being lowered to >1.10 to align with the FDA approved guidance for the  current laboratory assay.  If heparin  results are below expected values, and patient dosage has  been confirmed, suggest follow up testing of antithrombin III levels. Performed at [facility], [address] [place], [place] [address]   CBC every 6 hours x 4 post-procedure     Status: Abnormal   Collection Time: 03/07/23  2:36 PM  Result Value Ref Range   WBC 10.4 4.0 - 10.5 K/uL   RBC 3.33 (L) 3.87 - 5.11 MIL/uL   Hemoglobin 11.0 (L) 12.0 - 15.0 g/dL   HCT 67.6 (L) 63.9 - 53.9 %   MCV 97.0 80.0 - 100.0 fL   MCH 33.0 26.0 - 34.0 pg   MCHC 34.1 30.0 - 36.0 g/dL   RDW 86.5 88.4 - 84.4 %   Platelets 238 150 - 400 K/uL   nRBC 0.0 0.0 - 0.2 %    Comment: Performed at [facility], [address] [place], [place] [address]  Fibrinogen  every 6 hours x 4 post-procedure     Status: Abnormal   Collection Time: 03/07/23  2:36 PM  Result Value Ref Range   Fibrinogen  707 (H) 210 - 475 mg/dL    Comment: (NOTE) Fibrinogen  results may be underestimated in patients receiving thrombolytic therapy. Performed at [facility], [address] [place], [place] [address]   Glucose, capillary     Status: Abnormal   Collection Time: 03/07/23  3:31 PM  Result Value Ref Range   Glucose-Capillary 102 (H) 70 - 99 mg/dL    Comment: Glucose reference range applies only to samples taken after fasting for at least 8 hours.  Heparin  level (unfractionated) every 6 hours x 4 post-procedure     Status: Abnormal   Collection Time: 03/07/23  6:06 PM  Result Value Ref Range   Heparin  Unfractionated 0.17 (L) 0.30 - 0.70 IU/mL    Comment: (NOTE) The clinical reportable range upper limit is being lowered to >1.10 to align with the FDA approved guidance for the current laboratory assay.  If heparin  results are below expected values, and patient dosage has  been confirmed, suggest follow up testing of antithrombin III levels. Performed at [facility], [address] [place], [place] [address]   CBC every 6 hours x 4 post-procedure     Status: Abnormal   Collection Time: 03/07/23  6:06 PM  Result Value Ref Range   WBC 10.1 4.0 - 10.5 K/uL   RBC 3.29 (L) 3.87 - 5.11 MIL/uL   Hemoglobin 11.0 (L) 12.0 - 15.0 g/dL   HCT 67.7 (L)  36.0 - 46.0 %   MCV 97.9 80.0 - 100.0 fL   MCH 33.4 26.0 - 34.0 pg   MCHC 34.2 30.0 - 36.0 g/dL   RDW 86.3 88.4 - 84.4 %   Platelets 214 150 - 400 K/uL   nRBC 0.0 0.0 - 0.2 %    Comment: Performed at [facility], [address] [place], [place] [address]  Fibrinogen  every 6 hours x 4 post-procedure     Status: Abnormal   Collection Time: 03/07/23  6:06 PM  Result Value Ref Range   Fibrinogen  615 (H) 210 - 475 mg/dL    Comment: (NOTE) Fibrinogen  results may be underestimated in patients receiving thrombolytic therapy. Performed at [facility], [address] [place], [place] [address]   MRSA Next Gen by PCR, Nasal     Status: None   Collection Time: 03/07/23  6:06 PM   Specimen: Nasal Mucosa; Nasal Swab  Result Value Ref Range   MRSA by PCR Next Gen NOT DETECTED NOT DETECTED    Comment: (NOTE) The GeneXpert MRSA Assay (FDA approved for NASAL specimens only), is one component of a comprehensive MRSA colonization surveillance program. It is not intended to diagnose MRSA infection nor to guide or monitor treatment for MRSA infections. Test performance is not FDA approved in patients less than 75 years old. Performed at [facility], [address] [place], [place] [address]   Heparin  level (unfractionated) every 6 hours x 4 post-procedure     Status: Abnormal   Collection Time: 03/08/23 12:28 AM  Result Value Ref Range   Heparin  Unfractionated <0.10 (L) 0.30 - 0.70 IU/mL    Comment: REPEATED TO VERIFY (NOTE) The clinical reportable range upper limit is being lowered to >1.10 to align with the FDA approved guidance for the current laboratory assay.  If heparin  results are below  expected values, and patient dosage has  been confirmed, suggest follow up testing of antithrombin III levels. Performed at [facility], [address] [place], [place] [address]   CBC every 6 hours x 4 post-procedure     Status: Abnormal   Collection Time: 03/08/23 12:28 AM  Result Value Ref Range   WBC 9.6 4.0 - 10.5 K/uL   RBC 3.17 (L) 3.87 - 5.11 MIL/uL   Hemoglobin 10.5 (L) 12.0 - 15.0 g/dL   HCT 68.8 (L) 63.9 - 53.9 %   MCV 98.1 80.0 - 100.0 fL   MCH 33.1 26.0 - 34.0 pg   MCHC 33.8 30.0 - 36.0 g/dL   RDW 86.4 88.4 - 84.4 %   Platelets 192 150 - 400 K/uL   nRBC 0.0 0.0 - 0.2 %    Comment: Performed at [facility], [address] [place], [place] [address]  Fibrinogen  every 6 hours x 4 post-procedure     Status: None   Collection Time: 03/08/23 12:28 AM  Result Value Ref Range   Fibrinogen  462 210 - 475 mg/dL    Comment: (NOTE) Fibrinogen  results may be underestimated in patients receiving thrombolytic therapy. Performed at [facility], [address] [place], [place] [address]   Heparin  level (unfractionated)     Status: Abnormal   Collection Time: 03/08/23  6:08 AM  Result Value Ref Range   Heparin  Unfractionated 0.12 (L) 0.30 - 0.70 IU/mL    Comment: (NOTE) The clinical reportable range upper limit is being lowered to >1.10 to align with the FDA approved guidance for the current laboratory assay.  If  heparin  results are below expected values, and patient dosage has  been confirmed, suggest follow up testing of antithrombin III levels. Performed at [facility], [address] [place], [place] [address]   CBC     Status: Abnormal   Collection Time: 03/08/23  6:08 AM  Result Value Ref Range   WBC 8.5 4.0 - 10.5 K/uL   RBC 3.06 (L) 3.87 - 5.11 MIL/uL   Hemoglobin 10.1 (L) 12.0 - 15.0 g/dL   HCT 70.0 (L) 63.9 - 53.9 %   MCV 97.7 80.0 - 100.0 fL   MCH 33.0 26.0 - 34.0 pg   MCHC 33.8 30.0 - 36.0 g/dL   RDW 86.5 88.4  - 84.4 %   Platelets 195 150 - 400 K/uL   nRBC 0.0 0.0 - 0.2 %    Comment: Performed at [facility], [address] [place], [place] [address]  Basic metabolic panel     Status: Abnormal   Collection Time: 03/08/23  6:08 AM  Result Value Ref Range   Sodium 134 (L) 135 - 145 mmol/L   Potassium 3.1 (L) 3.5 - 5.1 mmol/L   Chloride 100 98 - 111 mmol/L   CO2 25 22 - 32 mmol/L   Glucose, Bld 111 (H) 70 - 99 mg/dL    Comment: Glucose reference range applies only to samples taken after fasting for at least 8 hours.   BUN 9 8 - 23 mg/dL   Creatinine, Ser 9.37 0.44 - 1.00 mg/dL   Calcium  8.5 (L) 8.9 - 10.3 mg/dL   GFR, Estimated >39 >39 mL/min    Comment: (NOTE) Calculated using the CKD-EPI Creatinine Equation (2021)    Anion gap 9 5 - 15    Comment: Performed at [facility], [address] [place], [place] [address]  Magnesium      Status: None   Collection Time: 03/08/23  6:08 AM  Result Value Ref Range   Magnesium  1.7 1.7 - 2.4 mg/dL    Comment: Performed at [facility], [address] [place], [place] [address]  CBC every 6 hours x 4 post-procedure     Status: Abnormal   Collection Time: 03/08/23 11:26 AM  Result Value Ref Range   WBC 9.6 4.0 - 10.5 K/uL   RBC 3.12 (L) 3.87 - 5.11 MIL/uL   Hemoglobin 10.3 (L) 12.0 - 15.0 g/dL   HCT 70.0 (L) 63.9 - 53.9 %   MCV 95.8 80.0 - 100.0 fL   MCH 33.0 26.0 - 34.0 pg   MCHC 34.4 30.0 - 36.0 g/dL   RDW 86.5 88.4 - 84.4 %   Platelets 201 150 - 400 K/uL   nRBC 0.0 0.0 - 0.2 %    Comment: Performed at [facility] [address] [place], [place] [address]  Basic metabolic panel     Status: Abnormal   Collection Time: 03/09/23  4:19 AM  Result Value Ref Range   Sodium 133 (L) 135 - 145 mmol/L   Potassium 3.0 (L) 3.5 - 5.1 mmol/L   Chloride 101 98 - 111 mmol/L   CO2 24 22 - 32 mmol/L   Glucose, Bld 145 (H) 70 - 99 mg/dL    Comment: Glucose reference range applies only to samples taken after  fasting for at least 8 hours.   BUN 14 8 - 23 mg/dL   Creatinine, Ser 9.14 0.44 - 1.00 mg/dL   Calcium  8.2 (L) 8.9 - 10.3 mg/dL   GFR, Estimated >39 >39  mL/min    Comment: (NOTE) Calculated using the CKD-EPI Creatinine Equation (2021)    Anion gap 8 5 - 15    Comment: Performed at [facility], [address] [place], [place] [address]  Magnesium      Status: None   Collection Time: 03/09/23  4:19 AM  Result Value Ref Range   Magnesium  1.7 1.7 - 2.4 mg/dL    Comment: Performed at [facility], [address] [place], [place] [address]  CBC     Status: Abnormal   Collection Time: 03/09/23  4:19 AM  Result Value Ref Range   WBC 11.5 (H) 4.0 - 10.5 K/uL   RBC 2.91 (L) 3.87 - 5.11 MIL/uL   Hemoglobin 9.6 (L) 12.0 - 15.0 g/dL   HCT 71.1 (L) 63.9 - 53.9 %   MCV 99.0 80.0 - 100.0 fL   MCH 33.0 26.0 - 34.0 pg   MCHC 33.3 30.0 - 36.0 g/dL   RDW 86.2 88.4 - 84.4 %   Platelets 190 150 - 400 K/uL   nRBC 0.0 0.0 - 0.2 %    Comment: Performed at [facility], [address] [place], [place] [address]  Heparin  level (unfractionated)     Status: Abnormal   Collection Time: 03/09/23  4:06 PM  Result Value Ref Range   Heparin  Unfractionated <0.10 (L) 0.30 - 0.70 IU/mL    Comment: (NOTE) The clinical reportable range upper limit is being lowered to >1.10 to align with the FDA approved guidance for the current laboratory assay.  If heparin  results are below expected values, and patient dosage has  been confirmed, suggest follow up testing of antithrombin III levels. Performed at [facility], [address] [place], [place] [address]   Heparin  level (unfractionated)     Status: Abnormal   Collection Time: 03/10/23 12:59 AM  Result Value Ref Range   Heparin  Unfractionated 0.22 (L) 0.30 - 0.70 IU/mL    Comment: (NOTE) The clinical reportable range upper limit is being lowered to >1.10 to align with the FDA approved guidance for the current  laboratory assay.  If heparin  results are below expected values, and patient dosage has  been confirmed, suggest follow up testing of antithrombin III levels. Performed at [facility], [address] [place], [place] [address]   Basic metabolic panel     Status: Abnormal   Collection Time: 03/10/23 10:36 AM  Result Value Ref Range   Sodium 132 (L) 135 - 145 mmol/L   Potassium 3.4 (L) 3.5 - 5.1 mmol/L   Chloride 99 98 - 111 mmol/L   CO2 26 22 - 32 mmol/L   Glucose, Bld 150 (H) 70 - 99 mg/dL    Comment: Glucose reference range applies only to samples taken after fasting for at least 8 hours.   BUN 13 8 - 23 mg/dL   Creatinine, Ser 9.14 0.44 - 1.00 mg/dL   Calcium  8.1 (L) 8.9 - 10.3 mg/dL   GFR, Estimated >39 >39 mL/min    Comment: (NOTE) Calculated using the CKD-EPI Creatinine Equation (2021)    Anion gap 7 5 - 15    Comment: Performed at [facility], [address] [place], [place] [address]  Magnesium      Status: None   Collection Time: 03/10/23 10:36 AM  Result Value Ref Range   Magnesium  2.0 1.7 - 2.4 mg/dL    Comment: Performed at [facility], [address] [place], [place] [address]  CBC  Status: Abnormal   Collection Time: 03/10/23 10:36 AM  Result Value Ref Range   WBC 13.1 (H) 4.0 - 10.5 K/uL   RBC 2.98 (L) 3.87 - 5.11 MIL/uL   Hemoglobin 9.8 (L) 12.0 - 15.0 g/dL   HCT 70.6 (L) 63.9 - 53.9 %   MCV 98.3 80.0 - 100.0 fL   MCH 32.9 26.0 - 34.0 pg   MCHC 33.4 30.0 - 36.0 g/dL   RDW 86.2 88.4 - 84.4 %   Platelets 195 150 - 400 K/uL   nRBC 0.0 0.0 - 0.2 %    Comment: Performed at [facility], [address] [place], [place] [address]  Heparin  level (unfractionated)     Status: Abnormal   Collection Time: 03/10/23 10:36 AM  Result Value Ref Range   Heparin  Unfractionated 0.18 (L) 0.30 - 0.70 IU/mL    Comment: (NOTE) The clinical reportable range upper limit is being lowered to >1.10 to align with the FDA approved  guidance for the current laboratory assay.  If heparin  results are below expected values, and patient dosage has  been confirmed, suggest follow up testing of antithrombin III levels. Performed at [facility], [address] [place], [place] [address]   Culture, blood (Routine X 2) w Reflex to ID Panel     Status: None   Collection Time: 03/10/23 12:17 PM   Specimen: BLOOD  Result Value Ref Range   Specimen Description BLOOD RIGHT ANTECUBITAL    Special Requests      BOTTLES DRAWN AEROBIC AND ANAEROBIC Blood Culture results may not be optimal due to an excessive volume of blood received in culture bottles   Culture      NO GROWTH 5 DAYS Performed at [facility], [address] [place], [place] [address]    Report Status 03/15/2023 FINAL   Culture, blood (Routine X 2) w Reflex to ID Panel     Status: None   Collection Time: 03/10/23 12:25 PM   Specimen: BLOOD  Result Value Ref Range   Specimen Description BLOOD BLOOD RIGHT HAND    Special Requests      BOTTLES DRAWN AEROBIC AND ANAEROBIC Blood Culture results may not be optimal due to an inadequate volume of blood received in culture bottles   Culture      NO GROWTH 5 DAYS Performed at [facility], [address] [place], [place] [address]    Report Status 03/15/2023 FINAL   Urinalysis, w/ Reflex to Culture (Infection Suspected) -Urine, Random     Status: Abnormal   Collection Time: 03/10/23 12:33 PM  Result Value Ref Range   Specimen Source URINE, RANDOM    Color, Urine AMBER (A) YELLOW    Comment: BIOCHEMICALS MAY BE AFFECTED BY COLOR   APPearance CLOUDY (A) CLEAR   Specific Gravity, Urine 1.026 1.005 - 1.030   pH 5.0 5.0 - 8.0   Glucose, UA NEGATIVE NEGATIVE mg/dL   Hgb urine dipstick MODERATE (A) NEGATIVE   Bilirubin Urine NEGATIVE NEGATIVE   Ketones, ur NEGATIVE NEGATIVE mg/dL   Protein, ur 30 (A) NEGATIVE mg/dL   Nitrite NEGATIVE NEGATIVE   Leukocytes,Ua NEGATIVE NEGATIVE   RBC  / HPF 6-10 0 - 5 RBC/hpf   WBC, UA 0-5 0 - 5 WBC/hpf    Comment:        Reflex urine culture not performed if WBC <=10, OR if Squamous epithelial cells >5. If Squamous epithelial cells >5 suggest recollection.    Bacteria, UA FEW (  A) NONE SEEN   Squamous Epithelial / HPF 0-5 0 - 5 /HPF   Mucus PRESENT    Hyaline Casts, UA PRESENT    Amorphous Crystal PRESENT     Comment: Performed at [facility], [address] [place], [place] [address]  Basic metabolic panel     Status: Abnormal   Collection Time: 03/11/23  5:55 AM  Result Value Ref Range   Sodium 132 (L) 135 - 145 mmol/L   Potassium 3.5 3.5 - 5.1 mmol/L   Chloride 101 98 - 111 mmol/L   CO2 25 22 - 32 mmol/L   Glucose, Bld 130 (H) 70 - 99 mg/dL    Comment: Glucose reference range applies only to samples taken after fasting for at least 8 hours.   BUN 13 8 - 23 mg/dL   Creatinine, Ser 9.18 0.44 - 1.00 mg/dL   Calcium  8.1 (L) 8.9 - 10.3 mg/dL   GFR, Estimated >39 >39 mL/min    Comment: (NOTE) Calculated using the CKD-EPI Creatinine Equation (2021)    Anion gap 6 5 - 15    Comment: Performed at [facility], [address] [place], [place] [address]  Magnesium      Status: None   Collection Time: 03/11/23  5:55 AM  Result Value Ref Range   Magnesium  1.9 1.7 - 2.4 mg/dL    Comment: Performed at [facility], [address] [place], [place] [address]  Procalcitonin     Status: None   Collection Time: 03/11/23  5:55 AM  Result Value Ref Range   Procalcitonin 0.50 ng/mL    Comment:        Interpretation: PCT (Procalcitonin) <= 0.5 ng/mL: Systemic infection (sepsis) is not likely. Local bacterial infection is possible. (NOTE)       Sepsis PCT Algorithm           Lower Respiratory Tract                                      Infection PCT Algorithm    ----------------------------     ----------------------------         PCT < 0.25 ng/mL                PCT < 0.10 ng/mL          Strongly  encourage             Strongly discourage   discontinuation of antibiotics    initiation of antibiotics    ----------------------------     -----------------------------       PCT 0.25 - 0.50 ng/mL            PCT 0.10 - 0.25 ng/mL               OR       >80% decrease in PCT            Discourage initiation of                                            antibiotics      Encourage discontinuation           of antibiotics    ----------------------------     -----------------------------         PCT >= 0.50  ng/mL              PCT 0.26 - 0.50 ng/mL               AND        <80% decrease in PCT             Encourage initiation of                                             antibiotics       Encourage continuation           of antibiotics    ----------------------------     -----------------------------        PCT >= 0.50 ng/mL                  PCT > 0.50 ng/mL               AND         increase in PCT                  Strongly encourage                                      initiation of antibiotics    Strongly encourage escalation           of antibiotics                                     -----------------------------                                           PCT <= 0.25 ng/mL                                                 OR                                        > 80% decrease in PCT                                      Discontinue / Do not initiate                                             antibiotics  Performed at [facility], [address] [place], [place] [address]   CBC     Status: Abnormal   Collection Time: 03/11/23 12:35 PM  Result Value Ref Range   WBC 16.3 (H) 4.0 - 10.5 K/uL   RBC 2.51 (L) 3.87 - 5.11 MIL/uL   Hemoglobin 8.3 (L) 12.0 - 15.0 g/dL   HCT 75.9 (L) 63.9 -  46.0 %   MCV 95.6 80.0 - 100.0 fL   MCH 33.1 26.0 - 34.0 pg   MCHC 34.6 30.0 - 36.0 g/dL   RDW 86.1 88.4 - 84.4 %   Platelets 200 150 - 400 K/uL   nRBC 0.0 0.0 - 0.2 %    Comment:  Performed at [facility], [address] [place], [place] [address]  Basic metabolic panel     Status: Abnormal   Collection Time: 03/12/23  2:44 AM  Result Value Ref Range   Sodium 128 (L) 135 - 145 mmol/L   Potassium 3.2 (L) 3.5 - 5.1 mmol/L   Chloride 97 (L) 98 - 111 mmol/L   CO2 23 22 - 32 mmol/L   Glucose, Bld 100 (H) 70 - 99 mg/dL    Comment: Glucose reference range applies only to samples taken after fasting for at least 8 hours.   BUN 13 8 - 23 mg/dL   Creatinine, Ser 9.32 0.44 - 1.00 mg/dL   Calcium  7.9 (L) 8.9 - 10.3 mg/dL   GFR, Estimated >39 >39 mL/min    Comment: (NOTE) Calculated using the CKD-EPI Creatinine Equation (2021)    Anion gap 8 5 - 15    Comment: Performed at [facility], [address] [place], [place] [address]  Magnesium      Status: None   Collection Time: 03/12/23  2:44 AM  Result Value Ref Range   Magnesium  1.9 1.7 - 2.4 mg/dL    Comment: Performed at [facility], [address] [place], [place] [address]  CBC     Status: Abnormal   Collection Time: 03/13/23  7:06 AM  Result Value Ref Range   WBC 12.3 (H) 4.0 - 10.5 K/uL   RBC 2.45 (L) 3.87 - 5.11 MIL/uL   Hemoglobin 8.1 (L) 12.0 - 15.0 g/dL   HCT 76.1 (L) 63.9 - 53.9 %   MCV 97.1 80.0 - 100.0 fL   MCH 33.1 26.0 - 34.0 pg   MCHC 34.0 30.0 - 36.0 g/dL   RDW 86.0 88.4 - 84.4 %   Platelets 257 150 - 400 K/uL   nRBC 0.0 0.0 - 0.2 %    Comment: Performed at [facility], [address], [place], [place] [address]  Basic metabolic panel     Status: Abnormal   Collection Time: 03/13/23  7:06 AM  Result Value Ref Range   Sodium 130 (L) 135 - 145 mmol/L   Potassium 3.7 3.5 - 5.1 mmol/L   Chloride 98 98 - 111 mmol/L   CO2 23 22 - 32 mmol/L   Glucose, Bld 107 (H) 70 - 99 mg/dL    Comment: Glucose reference range applies only to samples taken after fasting for at least 8 hours.   BUN 12 8 - 23 mg/dL   Creatinine, Ser 9.29 0.44 - 1.00 mg/dL   Calcium  8.2  (L) 8.9 - 10.3 mg/dL   GFR, Estimated >39 >39 mL/min    Comment: (NOTE) Calculated using the CKD-EPI Creatinine Equation (2021)    Anion gap 9 5 - 15    Comment: Performed at [facility], [address] [place], [place] [address]       Assessment & Plan:   Problem List Items Addressed This Visit       Cardiovascular and Mediastinum   Atherosclerosis of native arteries of extremity with intermittent claudication [facility])   Patient is seen by Vascular, who manage this condition.  She is well controlled  with current therapy.   Will defer to them for further changes to plan of care.       Relevant Medications   oxyCODONE  (OXY IR/ROXICODONE ) 5 MG immediate release tablet   Other Relevant Orders   Ambulatory referral to Home Health   Atherosclerosis of native arteries of the extremities with ulceration (HCC)   Setting up home health to come out to help with her wound care and with strength and endurance.  Patient has follow up visit already scheduled with Vascular.   Will suggest wet-to-dry dressings in the interim, until we have a more definitive plan of care.       Relevant Orders   Ambulatory referral to Home Health   Acute lower limb ischemia   Patient is seen by Vascular, who manage this condition.  She is well controlled with current therapy.   Will defer to them for further changes to plan of care.      Relevant Orders   Ambulatory referral to Home Health   Ischemic foot - Primary   Patient is seen by Vascular, who manage this condition.  She is well controlled with current therapy.   Will defer to them for further changes to plan of care.       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   Total time spent: 30 minutes  ALAN CHRISTELLA ARRANT,  FNP  03/31/2023   This document may have been prepared by [facility] Voice Recognition software and as such may include unintentional dictation errors.

## 2023-04-06 ENCOUNTER — Ambulatory Visit (INDEPENDENT_AMBULATORY_CARE_PROVIDER_SITE_OTHER): Payer: Medicare HMO | Admitting: Nurse Practitioner

## 2023-04-06 ENCOUNTER — Encounter (INDEPENDENT_AMBULATORY_CARE_PROVIDER_SITE_OTHER): Payer: Medicare HMO

## 2023-04-13 ENCOUNTER — Telehealth (INDEPENDENT_AMBULATORY_CARE_PROVIDER_SITE_OTHER): Payer: Self-pay

## 2023-04-13 ENCOUNTER — Telehealth: Payer: Self-pay

## 2023-04-13 NOTE — Telephone Encounter (Signed)
 She called and left a message wanting to discuss some more pain meds. No one came to look at her leg. Please advise.

## 2023-04-13 NOTE — Telephone Encounter (Signed)
 As you noted it looks like she did have a follow up on 1/8 that was no showed.  We can reschedule her with just an ABI to try to get her seen sooner

## 2023-04-13 NOTE — Telephone Encounter (Signed)
 Patient called needing an appt to see Dew. She stated she seen her in the hospital in 03/14/23 (left leg angio with stent placement), but doesn't have a follow appt. (Had an appt on 04/06/23 with 3 US . Appt was missed.) She stated the swelling has went down in her left leg but she now has deteriorating skin on her leg and it peeling.   Do you want to get her scheduled?  Please advise

## 2023-04-14 ENCOUNTER — Other Ambulatory Visit: Payer: Self-pay | Admitting: Family

## 2023-04-14 NOTE — Telephone Encounter (Signed)
Patient left VM requesting a return call.  Called patient back and wanted to let us know that she has not heard from home health at all. She also is requesting another Rx for oxy 5 mg. She admitted that she has been taking two of them at a time because she is in so much pain.

## 2023-04-15 ENCOUNTER — Telehealth: Payer: Self-pay | Admitting: Family

## 2023-04-15 MED ORDER — OXYCODONE HCL 5 MG PO TABS: 5.0000 mg | ORAL_TABLET | Freq: Four times a day (QID) | ORAL | 0 refills | Status: DC | PRN

## 2023-04-15 NOTE — Telephone Encounter (Signed)
Patient left VM requesting more pain meds again and stating she hasn't heard from the wound care people yet. She also used some not so nice words and phrases talking about our staff.  She was referred to [facility] for the wound care. I will follow up with them and see why they haven't contacted her yet.   I returned patient's call and had to [facility] - informed her Marchelle Folks said she will send more meds but must take AS PRESCRIBED, because she will NOT send any extra.

## 2023-04-18 DIAGNOSIS — J181 Lobar pneumonia, unspecified organism: Secondary | ICD-10-CM | POA: Diagnosis not present

## 2023-04-18 DIAGNOSIS — L89890 Pressure ulcer of other site, unstageable: Secondary | ICD-10-CM | POA: Diagnosis not present

## 2023-04-18 DIAGNOSIS — L89620 Pressure ulcer of left heel, unstageable: Secondary | ICD-10-CM | POA: Diagnosis not present

## 2023-04-18 DIAGNOSIS — I2510 Atherosclerotic heart disease of native coronary artery without angina pectoris: Secondary | ICD-10-CM | POA: Diagnosis not present

## 2023-04-18 DIAGNOSIS — M130 Polyarthritis, unspecified: Secondary | ICD-10-CM | POA: Diagnosis not present

## 2023-04-18 DIAGNOSIS — J440 Chronic obstructive pulmonary disease with acute lower respiratory infection: Secondary | ICD-10-CM | POA: Diagnosis not present

## 2023-04-18 DIAGNOSIS — M25461 Effusion, right knee: Secondary | ICD-10-CM | POA: Diagnosis not present

## 2023-04-18 DIAGNOSIS — I119 Hypertensive heart disease without heart failure: Secondary | ICD-10-CM | POA: Diagnosis not present

## 2023-04-18 DIAGNOSIS — T82898D Other specified complication of vascular prosthetic devices, implants and grafts, subsequent encounter: Secondary | ICD-10-CM | POA: Diagnosis not present

## 2023-04-19 ENCOUNTER — Ambulatory Visit (INDEPENDENT_AMBULATORY_CARE_PROVIDER_SITE_OTHER): Payer: Medicare HMO

## 2023-04-19 ENCOUNTER — Telehealth: Payer: Self-pay | Admitting: Family

## 2023-04-19 DIAGNOSIS — Z9889 Other specified postprocedural states: Secondary | ICD-10-CM | POA: Diagnosis not present

## 2023-04-19 DIAGNOSIS — I739 Peripheral vascular disease, unspecified: Secondary | ICD-10-CM | POA: Diagnosis not present

## 2023-04-19 NOTE — Telephone Encounter (Signed)
Shawnee, nurse from [facility], left VM that she went out for an assessment and the patient's BP was 164/78. Besides for the elevated BP she is in no distress. Just FYI.

## 2023-04-19 NOTE — Telephone Encounter (Signed)
Patients pain medication was sent for patient on 04/15/23

## 2023-04-20 DIAGNOSIS — I119 Hypertensive heart disease without heart failure: Secondary | ICD-10-CM | POA: Diagnosis not present

## 2023-04-20 DIAGNOSIS — L89620 Pressure ulcer of left heel, unstageable: Secondary | ICD-10-CM | POA: Diagnosis not present

## 2023-04-20 DIAGNOSIS — M25461 Effusion, right knee: Secondary | ICD-10-CM | POA: Diagnosis not present

## 2023-04-20 DIAGNOSIS — J440 Chronic obstructive pulmonary disease with acute lower respiratory infection: Secondary | ICD-10-CM | POA: Diagnosis not present

## 2023-04-20 DIAGNOSIS — M130 Polyarthritis, unspecified: Secondary | ICD-10-CM | POA: Diagnosis not present

## 2023-04-20 DIAGNOSIS — J181 Lobar pneumonia, unspecified organism: Secondary | ICD-10-CM | POA: Diagnosis not present

## 2023-04-20 DIAGNOSIS — I2510 Atherosclerotic heart disease of native coronary artery without angina pectoris: Secondary | ICD-10-CM | POA: Diagnosis not present

## 2023-04-20 DIAGNOSIS — L89890 Pressure ulcer of other site, unstageable: Secondary | ICD-10-CM | POA: Diagnosis not present

## 2023-04-20 DIAGNOSIS — T82898D Other specified complication of vascular prosthetic devices, implants and grafts, subsequent encounter: Secondary | ICD-10-CM | POA: Diagnosis not present

## 2023-04-20 LAB — VAS US ABI WITH/WO TBI
Left ABI: 0.96
Right ABI: 0.92

## 2023-04-20 MED ORDER — OXYCODONE HCL 5 MG PO TABS: 5.0000 mg | ORAL_TABLET | Freq: Four times a day (QID) | ORAL | 0 refills | Status: DC | PRN

## 2023-04-22 DIAGNOSIS — J181 Lobar pneumonia, unspecified organism: Secondary | ICD-10-CM | POA: Diagnosis not present

## 2023-04-22 DIAGNOSIS — L89620 Pressure ulcer of left heel, unstageable: Secondary | ICD-10-CM | POA: Diagnosis not present

## 2023-04-22 DIAGNOSIS — I2510 Atherosclerotic heart disease of native coronary artery without angina pectoris: Secondary | ICD-10-CM | POA: Diagnosis not present

## 2023-04-22 DIAGNOSIS — M25461 Effusion, right knee: Secondary | ICD-10-CM | POA: Diagnosis not present

## 2023-04-22 DIAGNOSIS — L89890 Pressure ulcer of other site, unstageable: Secondary | ICD-10-CM | POA: Diagnosis not present

## 2023-04-22 DIAGNOSIS — T82898D Other specified complication of vascular prosthetic devices, implants and grafts, subsequent encounter: Secondary | ICD-10-CM | POA: Diagnosis not present

## 2023-04-22 DIAGNOSIS — I119 Hypertensive heart disease without heart failure: Secondary | ICD-10-CM | POA: Diagnosis not present

## 2023-04-22 DIAGNOSIS — M130 Polyarthritis, unspecified: Secondary | ICD-10-CM | POA: Diagnosis not present

## 2023-04-22 DIAGNOSIS — J440 Chronic obstructive pulmonary disease with acute lower respiratory infection: Secondary | ICD-10-CM | POA: Diagnosis not present

## 2023-04-25 ENCOUNTER — Telehealth: Payer: Self-pay | Admitting: Family

## 2023-04-25 DIAGNOSIS — T82898D Other specified complication of vascular prosthetic devices, implants and grafts, subsequent encounter: Secondary | ICD-10-CM | POA: Diagnosis not present

## 2023-04-25 DIAGNOSIS — L89620 Pressure ulcer of left heel, unstageable: Secondary | ICD-10-CM | POA: Diagnosis not present

## 2023-04-25 DIAGNOSIS — I2510 Atherosclerotic heart disease of native coronary artery without angina pectoris: Secondary | ICD-10-CM | POA: Diagnosis not present

## 2023-04-25 DIAGNOSIS — J440 Chronic obstructive pulmonary disease with acute lower respiratory infection: Secondary | ICD-10-CM | POA: Diagnosis not present

## 2023-04-25 DIAGNOSIS — L89890 Pressure ulcer of other site, unstageable: Secondary | ICD-10-CM | POA: Diagnosis not present

## 2023-04-25 DIAGNOSIS — J181 Lobar pneumonia, unspecified organism: Secondary | ICD-10-CM | POA: Diagnosis not present

## 2023-04-25 DIAGNOSIS — M130 Polyarthritis, unspecified: Secondary | ICD-10-CM | POA: Diagnosis not present

## 2023-04-25 DIAGNOSIS — I119 Hypertensive heart disease without heart failure: Secondary | ICD-10-CM | POA: Diagnosis not present

## 2023-04-25 DIAGNOSIS — M25461 Effusion, right knee: Secondary | ICD-10-CM | POA: Diagnosis not present

## 2023-04-25 NOTE — Telephone Encounter (Signed)
Ana called saying that the patient asked her to call us because the oxycodone 5 mg is not help alleviate her pain at all and she wants something stronger.   [employer] # [phone]

## 2023-04-28 ENCOUNTER — Telehealth: Payer: Self-pay | Admitting: Family

## 2023-04-28 DIAGNOSIS — T82898D Other specified complication of vascular prosthetic devices, implants and grafts, subsequent encounter: Secondary | ICD-10-CM | POA: Diagnosis not present

## 2023-04-28 DIAGNOSIS — J440 Chronic obstructive pulmonary disease with acute lower respiratory infection: Secondary | ICD-10-CM | POA: Diagnosis not present

## 2023-04-28 DIAGNOSIS — J181 Lobar pneumonia, unspecified organism: Secondary | ICD-10-CM | POA: Diagnosis not present

## 2023-04-28 DIAGNOSIS — L89890 Pressure ulcer of other site, unstageable: Secondary | ICD-10-CM | POA: Diagnosis not present

## 2023-04-28 DIAGNOSIS — I2510 Atherosclerotic heart disease of native coronary artery without angina pectoris: Secondary | ICD-10-CM | POA: Diagnosis not present

## 2023-04-28 DIAGNOSIS — L89620 Pressure ulcer of left heel, unstageable: Secondary | ICD-10-CM | POA: Diagnosis not present

## 2023-04-28 DIAGNOSIS — I119 Hypertensive heart disease without heart failure: Secondary | ICD-10-CM | POA: Diagnosis not present

## 2023-04-28 DIAGNOSIS — M25461 Effusion, right knee: Secondary | ICD-10-CM | POA: Diagnosis not present

## 2023-04-28 DIAGNOSIS — M130 Polyarthritis, unspecified: Secondary | ICD-10-CM | POA: Diagnosis not present

## 2023-04-28 NOTE — Telephone Encounter (Signed)
Bonnie with [facility], left VM requesting a call back about this patient.   Spoke with Kendal Hymen and she feels like the patient needs to come in due to the drainage from the foot wound. Spoke with patient and she would like to be seen in our office tomorrow.

## 2023-04-29 ENCOUNTER — Encounter: Payer: Self-pay | Admitting: Internal Medicine

## 2023-04-29 ENCOUNTER — Ambulatory Visit (INDEPENDENT_AMBULATORY_CARE_PROVIDER_SITE_OTHER): Payer: Medicare HMO | Admitting: Internal Medicine

## 2023-04-29 VITALS — BP 148/70 | HR 88 | Ht 66.0 in | Wt 171.0 lb

## 2023-04-29 DIAGNOSIS — J181 Lobar pneumonia, unspecified organism: Secondary | ICD-10-CM | POA: Diagnosis not present

## 2023-04-29 DIAGNOSIS — J440 Chronic obstructive pulmonary disease with acute lower respiratory infection: Secondary | ICD-10-CM | POA: Diagnosis not present

## 2023-04-29 DIAGNOSIS — I2510 Atherosclerotic heart disease of native coronary artery without angina pectoris: Secondary | ICD-10-CM | POA: Diagnosis not present

## 2023-04-29 DIAGNOSIS — I10 Essential (primary) hypertension: Secondary | ICD-10-CM | POA: Diagnosis not present

## 2023-04-29 DIAGNOSIS — L89620 Pressure ulcer of left heel, unstageable: Secondary | ICD-10-CM | POA: Diagnosis not present

## 2023-04-29 DIAGNOSIS — I739 Peripheral vascular disease, unspecified: Secondary | ICD-10-CM | POA: Diagnosis not present

## 2023-04-29 DIAGNOSIS — I998 Other disorder of circulatory system: Secondary | ICD-10-CM

## 2023-04-29 DIAGNOSIS — R7303 Prediabetes: Secondary | ICD-10-CM

## 2023-04-29 DIAGNOSIS — M79605 Pain in left leg: Secondary | ICD-10-CM

## 2023-04-29 DIAGNOSIS — L89890 Pressure ulcer of other site, unstageable: Secondary | ICD-10-CM | POA: Diagnosis not present

## 2023-04-29 DIAGNOSIS — I119 Hypertensive heart disease without heart failure: Secondary | ICD-10-CM | POA: Diagnosis not present

## 2023-04-29 DIAGNOSIS — E782 Mixed hyperlipidemia: Secondary | ICD-10-CM | POA: Diagnosis not present

## 2023-04-29 DIAGNOSIS — M25461 Effusion, right knee: Secondary | ICD-10-CM | POA: Diagnosis not present

## 2023-04-29 DIAGNOSIS — T82898D Other specified complication of vascular prosthetic devices, implants and grafts, subsequent encounter: Secondary | ICD-10-CM | POA: Diagnosis not present

## 2023-04-29 DIAGNOSIS — F411 Generalized anxiety disorder: Secondary | ICD-10-CM

## 2023-04-29 DIAGNOSIS — M130 Polyarthritis, unspecified: Secondary | ICD-10-CM | POA: Diagnosis not present

## 2023-04-29 MED ORDER — GABAPENTIN 600 MG PO TABS: 600.0000 mg | ORAL_TABLET | Freq: Every day | ORAL | 2 refills | Status: DC

## 2023-04-29 MED ORDER — DULOXETINE HCL 30 MG PO CPEP: 30.0000 mg | ORAL_CAPSULE | Freq: Every day | ORAL | 2 refills | Status: DC

## 2023-04-29 NOTE — Progress Notes (Signed)
Established Patient Office Visit  Subjective:  Patient ID: Jackie Macias, female    DOB: [birth year]  Age: 68 y.o. MRN: [id number]  Chief Complaint  Patient presents with   Follow-up    Foot wound draining    Patient comes in for follow-up of her ischemic left foot ulcer.  She has home health agencies were dressing the wound.  Reports that it is not getting better and it is draining and is very painful.  Will set up an appointment with the podiatrist, as well as the wound clinic.  Patient apparently was pouring hydrogen peroxide on it herself, advised her not to do that. Patient reports of pain not really responding to oxycodone.  Patient will be set up with pain clinic, and will increase her dose of gabapentin to 600 mg 3 times a day.  Will also send in a prescription for Cymbalta 30 mg/day.    No other concerns at this time.   Past Medical History:  Diagnosis Date   Arthritis    Bronchitis    COPD (chronic obstructive pulmonary disease) (HCC)    GERD (gastroesophageal reflux disease)    Gout    Hypertension     Past Surgical History:  Procedure Laterality Date   AMPUTATION Right 07/29/2017   Procedure: AMPUTATION DIGIT/ 33295;  Surgeon: Linus Galas, DPM;  Location: [facility];  Service: Podiatry;  Laterality: Right;   AMPUTATION TOE Right 02/18/2021   Procedure: AMPUTATION TOE-GREAT TOE;  Surgeon: Gwyneth Revels, DPM;  Location: [facility];  Service: Podiatry;  Laterality: Right;   CHOLECYSTECTOMY     facial biopsy Right    JOINT REPLACEMENT Right 12/2016   THR   LOWER EXTREMITY ANGIOGRAPHY Right 09/27/2017   Procedure: LOWER EXTREMITY ANGIOGRAPHY;  Surgeon: Renford Dills, MD;  Location: [facility];  Service: Cardiovascular;  Laterality: Right;   LOWER EXTREMITY ANGIOGRAPHY Right 06/17/2020   Procedure: LOWER EXTREMITY ANGIOGRAPHY;  Surgeon: Renford Dills, MD;  Location: [facility];  Service: Cardiovascular;  Laterality: Right;   LOWER EXTREMITY  ANGIOGRAPHY Right 02/18/2021   Procedure: LOWER EXTREMITY ANGIOGRAPHY;  Surgeon: Renford Dills, MD;  Location: [facility];  Service: Cardiovascular;  Laterality: Right;   LOWER EXTREMITY ANGIOGRAPHY Left 09/07/2022   Procedure: Lower Extremity Angiography;  Surgeon: Renford Dills, MD;  Location: [facility];  Service: Cardiovascular;  Laterality: Left;   LOWER EXTREMITY ANGIOGRAPHY Left 03/07/2023   Procedure: Lower Extremity Angiography;  Surgeon: Annice Needy, MD;  Location: [facility];  Service: Cardiovascular;  Laterality: Left;   LOWER EXTREMITY ANGIOGRAPHY Left 03/08/2023   Procedure: Lower Extremity Angiography;  Surgeon: Renford Dills, MD;  Location: [facility];  Service: Cardiovascular;  Laterality: Left;   TONSILLECTOMY     TOTAL HIP ARTHROPLASTY Right 01/17/2017   Procedure: TOTAL HIP ARTHROPLASTY ANTERIOR APPROACH;  Surgeon: Lyndle Herrlich, MD;  Location: [facility];  Service: Orthopedics;  Laterality: Right;    Social History   Socioeconomic History   Marital status: Single    Spouse name: Not on file   Number of children: Not on file   Years of education: Not on file   Highest education level: Not on file  Occupational History   Not on file  Tobacco Use   Smoking status: Every Day    Current packs/day: 1.00    Average packs/day: 1 pack/day for 50.0 years (50.0 ttl pk-yrs)    Types: Cigarettes   Smokeless  tobacco: Never  Vaping Use   Vaping status: Never Used  Substance and Sexual Activity   Alcohol use: Yes    Alcohol/week: 3.0 standard drinks of alcohol    Types: 3 Cans of beer per week    Comment: occasional   Drug use: No   Sexual activity: Not on file  Other Topics Concern   Not on file  Social History Narrative   Not on file   Social Drivers of Health   Financial Resource Strain: Not on file  Food Insecurity: No Food Insecurity (03/07/2023)   Hunger Vital Sign    Worried About Running Out of Food in  the Last Year: Never true    Ran Out of Food in the Last Year: Never true  Transportation Needs: No Transportation Needs (03/07/2023)   PRAPARE - Administrator, Civil Service (Medical): No    Lack of Transportation (Non-Medical): No  Physical Activity: Not on file  Stress: Not on file  Social Connections: Not on file  Intimate Partner Violence: Not At Risk (03/07/2023)   Humiliation, Afraid, Rape, and Kick questionnaire    Fear of Current or Ex-Partner: No    Emotionally Abused: No    Physically Abused: No    Sexually Abused: No    Family History  Problem Relation Age of Onset   Breast cancer Sister 51   Breast cancer Other     Allergies  Allergen Reactions   Lisinopril-Hydrochlorothiazide Other (See Comments)    Outpatient Medications Prior to Visit  Medication Sig   acetaminophen (TYLENOL) 325 MG tablet Take 2 tablets (650 mg total) by mouth every 6 (six) hours as needed for mild pain (pain score 1-3) (or Fever >/= 101).   albuterol (VENTOLIN HFA) 108 (90 Base) MCG/ACT inhaler Inhale 2 puffs into the lungs every 6 (six) hours as needed for wheezing or shortness of breath.   aspirin EC 81 MG tablet Take 1 tablet (81 mg total) by mouth daily. Swallow whole.   clopidogrel (PLAVIX) 75 MG tablet Take 1 tablet (75 mg total) by mouth daily.   doxycycline (VIBRA-TABS) 100 MG tablet Take 1 tablet (100 mg total) by mouth every 12 (twelve) hours.   ferrous sulfate 325 (65 FE) MG tablet Take 1 tablet (325 mg total) by mouth daily with breakfast.   furosemide (LASIX) 20 MG tablet Take 1 tablet (20 mg total) by mouth daily as needed for edema.   ipratropium-albuterol (DUONEB) 0.5-2.5 (3) MG/3ML SOLN Take 3 mLs by nebulization every 6 (six) hours as needed.   oxyCODONE (OXY IR/ROXICODONE) 5 MG immediate release tablet Take 1-2 tablets (5-10 mg total) by mouth every 6 (six) hours as needed for severe pain (pain score 7-10).   pantoprazole (PROTONIX) 40 MG tablet Take 1 tablet (40  mg total) by mouth every morning.   polyethylene glycol (MIRALAX / GLYCOLAX) 17 g packet Take 17 g by mouth daily.   rosuvastatin (CRESTOR) 20 MG tablet Take 1 tablet (20 mg total) by mouth daily.   sodium chloride 1 g tablet Take 1 tablet (1 g total) by mouth 2 (two) times daily with a meal.   Vitamin D, Ergocalciferol, (DRISDOL) 1.25 MG (50000 UNIT) CAPS capsule Take 1 capsule (50,000 Units total) by mouth every 7 (seven) days.   [DISCONTINUED] gabapentin (NEURONTIN) 300 MG capsule Take 1 capsule by mouth every morning and take 2 capsules by mouth at bedtime daily   No facility-administered medications prior to visit.    Review of Systems  Constitutional:  Positive for malaise/fatigue. Negative for chills, diaphoresis, fever and weight loss.  HENT: Negative.    Eyes: Negative.   Respiratory: Negative.  Negative for cough and shortness of breath.   Cardiovascular: Negative.  Negative for chest pain, palpitations and leg swelling.  Gastrointestinal: Negative.  Negative for abdominal pain, constipation, diarrhea, heartburn, nausea and vomiting.  Genitourinary: Negative.  Negative for dysuria and flank pain.  Musculoskeletal: Negative.  Negative for joint pain and myalgias.  Skin: Negative.   Neurological: Negative.  Negative for dizziness and headaches.  Endo/Heme/Allergies: Negative.   Psychiatric/Behavioral: Negative.  Negative for depression and suicidal ideas. The patient is not nervous/anxious.        Objective:   BP (!) 148/70   Pulse 88   Ht 5\' 6"  (1.676 m)   Wt 171 lb (77.6 kg)   SpO2 98%   BMI 27.60 kg/m   Vitals:   04/29/23 1007  BP: (!) 148/70  Pulse: 88  Height: 5\' 6"  (1.676 m)  Weight: 171 lb (77.6 kg)  SpO2: 98%  BMI (Calculated): 27.61    Physical Exam Vitals and nursing note reviewed.  Constitutional:      Appearance: Normal appearance.  HENT:     Head: Normocephalic and atraumatic.     Nose: Nose normal.     Mouth/Throat:     Mouth: Mucous  membranes are moist.     Pharynx: Oropharynx is clear.  Eyes:     Conjunctiva/sclera: Conjunctivae normal.     Pupils: Pupils are equal, round, and reactive to light.  Cardiovascular:     Rate and Rhythm: Normal rate and regular rhythm.     Pulses: Normal pulses.     Heart sounds: Normal heart sounds. No murmur heard. Pulmonary:     Effort: Pulmonary effort is normal.     Breath sounds: Normal breath sounds. No wheezing.  Abdominal:     General: Bowel sounds are normal.     Palpations: Abdomen is soft.     Tenderness: There is no abdominal tenderness. There is no right CVA tenderness or left CVA tenderness.  Musculoskeletal:        General: Swelling and tenderness present. Normal range of motion.     Cervical back: Normal range of motion.     Right lower leg: No edema.     Left lower leg: Edema present.  Skin:    General: Skin is warm and dry.  Neurological:     General: No focal deficit present.     Mental Status: She is alert and oriented to person, place, and time.  Psychiatric:        Mood and Affect: Mood normal.        Behavior: Behavior normal.      No results found for any visits on 04/29/23.  Recent Results (from the past 2160 hours)  CBC with Differential     Status: Abnormal   Collection Time: 03/06/23  8:44 AM  Result Value Ref Range   WBC 10.8 (H) 4.0 - 10.5 K/uL   RBC 3.78 (L) 3.87 - 5.11 MIL/uL   Hemoglobin 12.6 12.0 - 15.0 g/dL   HCT 16.1 09.6 - 04.5 %   MCV 100.0 80.0 - 100.0 fL   MCH 33.3 26.0 - 34.0 pg   MCHC 33.3 30.0 - 36.0 g/dL   RDW 40.9 81.1 - 91.4 %   Platelets 306 150 - 400 K/uL   nRBC 0.0 0.0 - 0.2 %   Neutrophils Relative % 63 %  Neutro Abs 6.8 1.7 - 7.7 K/uL   Lymphocytes Relative 25 %   Lymphs Abs 2.7 0.7 - 4.0 K/uL   Monocytes Relative 11 %   Monocytes Absolute 1.1 (H) 0.1 - 1.0 K/uL   Eosinophils Relative 0 %   Eosinophils Absolute 0.0 0.0 - 0.5 K/uL   Basophils Relative 0 %   Basophils Absolute 0.0 0.0 - 0.1 K/uL   Immature  Granulocytes 1 %   Abs Immature Granulocytes 0.05 0.00 - 0.07 K/uL    Comment: Performed at [facility], [address] [place], [place] [address]  Comprehensive metabolic panel     Status: Abnormal   Collection Time: 03/06/23  8:44 AM  Result Value Ref Range   Sodium 134 (L) 135 - 145 mmol/L   Potassium 3.8 3.5 - 5.1 mmol/L   Chloride 100 98 - 111 mmol/L   CO2 23 22 - 32 mmol/L   Glucose, Bld 110 (H) 70 - 99 mg/dL    Comment: Glucose reference range applies only to samples taken after fasting for at least 8 hours.   BUN 12 8 - 23 mg/dL   Creatinine, Ser 0.98 0.44 - 1.00 mg/dL   Calcium 9.1 8.9 - 11.9 mg/dL   Total Protein 8.0 6.5 - 8.1 g/dL   Albumin 3.7 3.5 - 5.0 g/dL   AST 42 (H) 15 - 41 U/L   ALT 19 0 - 44 U/L   Alkaline Phosphatase 70 38 - 126 U/L   Total Bilirubin 0.7 <1.2 mg/dL   GFR, Estimated >14 >78 mL/min    Comment: (NOTE) Calculated using the CKD-EPI Creatinine Equation (2021)    Anion gap 11 5 - 15    Comment: Performed at [facility], [address] [place], [place] [address]  Heparin level (unfractionated)     Status: None   Collection Time: 03/07/23 12:47 AM  Result Value Ref Range   Heparin Unfractionated 0.35 0.30 - 0.70 IU/mL    Comment: (NOTE) The clinical reportable range upper limit is being lowered to >1.10 to align with the FDA approved guidance for the current laboratory assay.  If heparin results are below expected values, and patient dosage has  been confirmed, suggest follow up testing of antithrombin III levels. Performed at [facility], [address] [place], [place] [address]   Basic metabolic panel     Status: Abnormal   Collection Time: 03/07/23  7:36 AM  Result Value Ref Range   Sodium 135 135 - 145 mmol/L   Potassium 3.5 3.5 - 5.1 mmol/L   Chloride 103 98 - 111 mmol/L   CO2 21 (L) 22 - 32 mmol/L   Glucose, Bld 123 (H) 70 - 99 mg/dL    Comment: Glucose reference range applies only to samples taken  after fasting for at least 8 hours.   BUN 13 8 - 23 mg/dL   Creatinine, Ser 5.78 0.44 - 1.00 mg/dL   Calcium 8.9 8.9 - 46.9 mg/dL   GFR, Estimated >62 >95 mL/min    Comment: (NOTE) Calculated using the CKD-EPI Creatinine Equation (2021)    Anion gap 11 5 - 15    Comment: Performed at [facility], [address] [place], [place] [address]  CBC     Status: Abnormal   Collection Time: 03/07/23  7:36 AM  Result Value Ref Range   WBC 8.7 4.0 - 10.5 K/uL   RBC 3.44 (L) 3.87 - 5.11 MIL/uL   Hemoglobin 11.5 (L) 12.0 -  15.0 g/dL   HCT 16.1 (L) 09.6 - 04.5 %   MCV 96.5 80.0 - 100.0 fL   MCH 33.4 26.0 - 34.0 pg   MCHC 34.6 30.0 - 36.0 g/dL   RDW 40.9 81.1 - 91.4 %   Platelets 257 150 - 400 K/uL   nRBC 0.0 0.0 - 0.2 %    Comment: Performed at [facility], [address] [place], [place] [address]  Heparin level (unfractionated)     Status: None   Collection Time: 03/07/23  7:36 AM  Result Value Ref Range   Heparin Unfractionated 0.34 0.30 - 0.70 IU/mL    Comment: (NOTE) The clinical reportable range upper limit is being lowered to >1.10 to align with the FDA approved guidance for the current laboratory assay.  If heparin results are below expected values, and patient dosage has  been confirmed, suggest follow up testing of antithrombin III levels. Performed at [facility], [address] [place], [place] [address]   Heparin level (unfractionated) every 6 hours x 4 post-procedure     Status: Abnormal   Collection Time: 03/07/23  2:36 PM  Result Value Ref Range   Heparin Unfractionated 0.77 (H) 0.30 - 0.70 IU/mL    Comment: (NOTE) The clinical reportable range upper limit is being lowered to >1.10 to align with the FDA approved guidance for the current laboratory assay.  If heparin results are below expected values, and patient dosage has  been confirmed, suggest follow up testing of antithrombin III levels. Performed at [facility],  [address] [place], [place] [address]   CBC every 6 hours x 4 post-procedure     Status: Abnormal   Collection Time: 03/07/23  2:36 PM  Result Value Ref Range   WBC 10.4 4.0 - 10.5 K/uL   RBC 3.33 (L) 3.87 - 5.11 MIL/uL   Hemoglobin 11.0 (L) 12.0 - 15.0 g/dL   HCT 46.9 (L) 62.9 - 52.8 %   MCV 97.0 80.0 - 100.0 fL   MCH 33.0 26.0 - 34.0 pg   MCHC 34.1 30.0 - 36.0 g/dL   RDW 41.3 24.4 - 01.0 %   Platelets 238 150 - 400 K/uL   nRBC 0.0 0.0 - 0.2 %    Comment: Performed at [facility], [address] [place], [place] [address]  Fibrinogen every 6 hours x 4 post-procedure     Status: Abnormal   Collection Time: 03/07/23  2:36 PM  Result Value Ref Range   Fibrinogen 707 (H) 210 - 475 mg/dL    Comment: (NOTE) Fibrinogen results may be underestimated in patients receiving thrombolytic therapy. Performed at [facility], [address] [place], [place] [address]   Glucose, capillary     Status: Abnormal   Collection Time: 03/07/23  3:31 PM  Result Value Ref Range   Glucose-Capillary 102 (H) 70 - 99 mg/dL    Comment: Glucose reference range applies only to samples taken after fasting for at least 8 hours.  Heparin level (unfractionated) every 6 hours x 4 post-procedure     Status: Abnormal   Collection Time: 03/07/23  6:06 PM  Result Value Ref Range   Heparin Unfractionated 0.17 (L) 0.30 - 0.70 IU/mL    Comment: (NOTE) The clinical reportable range upper limit is being lowered to >1.10 to align with the FDA approved guidance for the current laboratory assay.  If heparin results are below expected values, and patient dosage has  been confirmed, suggest follow up  testing of antithrombin III levels. Performed at [facility], [address] [place], [place] [address]   CBC every 6 hours x 4 post-procedure     Status: Abnormal   Collection Time: 03/07/23  6:06 PM  Result Value Ref Range   WBC 10.1 4.0 - 10.5 K/uL   RBC 3.29 (L) 3.87 - 5.11  MIL/uL   Hemoglobin 11.0 (L) 12.0 - 15.0 g/dL   HCT 60.4 (L) 54.0 - 98.1 %   MCV 97.9 80.0 - 100.0 fL   MCH 33.4 26.0 - 34.0 pg   MCHC 34.2 30.0 - 36.0 g/dL   RDW 19.1 47.8 - 29.5 %   Platelets 214 150 - 400 K/uL   nRBC 0.0 0.0 - 0.2 %    Comment: Performed at [facility], [address] [place], [place] [address]  Fibrinogen every 6 hours x 4 post-procedure     Status: Abnormal   Collection Time: 03/07/23  6:06 PM  Result Value Ref Range   Fibrinogen 615 (H) 210 - 475 mg/dL    Comment: (NOTE) Fibrinogen results may be underestimated in patients receiving thrombolytic therapy. Performed at [facility], [address] [place], [place] [address]   MRSA Next Gen by PCR, Nasal     Status: None   Collection Time: 03/07/23  6:06 PM   Specimen: Nasal Mucosa; Nasal Swab  Result Value Ref Range   MRSA by PCR Next Gen NOT DETECTED NOT DETECTED    Comment: (NOTE) The GeneXpert MRSA Assay (FDA approved for NASAL specimens only), is one component of a comprehensive MRSA colonization surveillance program. It is not intended to diagnose MRSA infection nor to guide or monitor treatment for MRSA infections. Test performance is not FDA approved in patients less than 22 years old. Performed at [facility], [address] [place], [place] [address]   Heparin level (unfractionated) every 6 hours x 4 post-procedure     Status: Abnormal   Collection Time: 03/08/23 12:28 AM  Result Value Ref Range   Heparin Unfractionated <0.10 (L) 0.30 - 0.70 IU/mL    Comment: REPEATED TO VERIFY (NOTE) The clinical reportable range upper limit is being lowered to >1.10 to align with the FDA approved guidance for the current laboratory assay.  If heparin results are below expected values, and patient dosage has  been confirmed, suggest follow up testing of antithrombin III levels. Performed at [facility], [address] [place], [place] [address]   CBC every 6  hours x 4 post-procedure     Status: Abnormal   Collection Time: 03/08/23 12:28 AM  Result Value Ref Range   WBC 9.6 4.0 - 10.5 K/uL   RBC 3.17 (L) 3.87 - 5.11 MIL/uL   Hemoglobin 10.5 (L) 12.0 - 15.0 g/dL   HCT 13.2 (L) 44.0 - 10.2 %   MCV 98.1 80.0 - 100.0 fL   MCH 33.1 26.0 - 34.0 pg   MCHC 33.8 30.0 - 36.0 g/dL   RDW 72.5 36.6 - 44.0 %   Platelets 192 150 - 400 K/uL   nRBC 0.0 0.0 - 0.2 %    Comment: Performed at [facility], [address] [place], [place] [address]  Fibrinogen every 6 hours x 4 post-procedure     Status: None   Collection Time: 03/08/23 12:28 AM  Result Value Ref Range   Fibrinogen 462 210 - 475 mg/dL    Comment: (NOTE) Fibrinogen results may be underestimated in patients receiving thrombolytic  therapy. Performed at [facility], [address] [place], [place] [address]   Heparin level (unfractionated)     Status: Abnormal   Collection Time: 03/08/23  6:08 AM  Result Value Ref Range   Heparin Unfractionated 0.12 (L) 0.30 - 0.70 IU/mL    Comment: (NOTE) The clinical reportable range upper limit is being lowered to >1.10 to align with the FDA approved guidance for the current laboratory assay.  If heparin results are below expected values, and patient dosage has  been confirmed, suggest follow up testing of antithrombin III levels. Performed at [facility], [address] [place], [place] [address]   CBC     Status: Abnormal   Collection Time: 03/08/23  6:08 AM  Result Value Ref Range   WBC 8.5 4.0 - 10.5 K/uL   RBC 3.06 (L) 3.87 - 5.11 MIL/uL   Hemoglobin 10.1 (L) 12.0 - 15.0 g/dL   HCT 32.4 (L) 40.1 - 02.7 %   MCV 97.7 80.0 - 100.0 fL   MCH 33.0 26.0 - 34.0 pg   MCHC 33.8 30.0 - 36.0 g/dL   RDW 25.3 66.4 - 40.3 %   Platelets 195 150 - 400 K/uL   nRBC 0.0 0.0 - 0.2 %    Comment: Performed at [facility], [address], [place], [place] [address]  Basic metabolic panel     Status: Abnormal    Collection Time: 03/08/23  6:08 AM  Result Value Ref Range   Sodium 134 (L) 135 - 145 mmol/L   Potassium 3.1 (L) 3.5 - 5.1 mmol/L   Chloride 100 98 - 111 mmol/L   CO2 25 22 - 32 mmol/L   Glucose, Bld 111 (H) 70 - 99 mg/dL    Comment: Glucose reference range applies only to samples taken after fasting for at least 8 hours.   BUN 9 8 - 23 mg/dL   Creatinine, Ser 9.56 0.44 - 1.00 mg/dL   Calcium 8.5 (L) 8.9 - 10.3 mg/dL   GFR, Estimated >38 >75 mL/min    Comment: (NOTE) Calculated using the CKD-EPI Creatinine Equation (2021)    Anion gap 9 5 - 15    Comment: Performed at [facility], [address] [place], [place] [address]  Magnesium     Status: None   Collection Time: 03/08/23  6:08 AM  Result Value Ref Range   Magnesium 1.7 1.7 - 2.4 mg/dL    Comment: Performed at [facility], [address] [place], [place] [address]  CBC every 6 hours x 4 post-procedure     Status: Abnormal   Collection Time: 03/08/23 11:26 AM  Result Value Ref Range   WBC 9.6 4.0 - 10.5 K/uL   RBC 3.12 (L) 3.87 - 5.11 MIL/uL   Hemoglobin 10.3 (L) 12.0 - 15.0 g/dL   HCT 41.6 (L) 60.6 - 30.1 %   MCV 95.8 80.0 - 100.0 fL   MCH 33.0 26.0 - 34.0 pg   MCHC 34.4 30.0 - 36.0 g/dL   RDW 60.1 09.3 - 23.5 %   Platelets 201 150 - 400 K/uL   nRBC 0.0 0.0 - 0.2 %    Comment: Performed at [facility], [address] [place], [place] [address]  Basic metabolic panel     Status: Abnormal   Collection Time: 03/09/23  4:19 AM  Result Value Ref Range   Sodium 133 (L) 135 - 145 mmol/L   Potassium 3.0 (L) 3.5 - 5.1 mmol/L  Chloride 101 98 - 111 mmol/L   CO2 24 22 - 32 mmol/L   Glucose, Bld 145 (H) 70 - 99 mg/dL    Comment: Glucose reference range applies only to samples taken after fasting for at least 8 hours.   BUN 14 8 - 23 mg/dL   Creatinine, Ser 5.40 0.44 - 1.00 mg/dL   Calcium 8.2 (L) 8.9 - 10.3 mg/dL   GFR, Estimated >98 >11 mL/min    Comment: (NOTE) Calculated using the  CKD-EPI Creatinine Equation (2021)    Anion gap 8 5 - 15    Comment: Performed at [facility], [address] [place], [place] [address]  Magnesium     Status: None   Collection Time: 03/09/23  4:19 AM  Result Value Ref Range   Magnesium 1.7 1.7 - 2.4 mg/dL    Comment: Performed at [facility] [address] [place], [place] [address]  CBC     Status: Abnormal   Collection Time: 03/09/23  4:19 AM  Result Value Ref Range   WBC 11.5 (H) 4.0 - 10.5 K/uL   RBC 2.91 (L) 3.87 - 5.11 MIL/uL   Hemoglobin 9.6 (L) 12.0 - 15.0 g/dL   HCT 13.0 (L) 86.5 - 78.4 %   MCV 99.0 80.0 - 100.0 fL   MCH 33.0 26.0 - 34.0 pg   MCHC 33.3 30.0 - 36.0 g/dL   RDW 69.6 29.5 - 28.4 %   Platelets 190 150 - 400 K/uL   nRBC 0.0 0.0 - 0.2 %    Comment: Performed at [facility], [address] [place], [place] [address]  Heparin level (unfractionated)     Status: Abnormal   Collection Time: 03/09/23  4:06 PM  Result Value Ref Range   Heparin Unfractionated <0.10 (L) 0.30 - 0.70 IU/mL    Comment: (NOTE) The clinical reportable range upper limit is being lowered to >1.10 to align with the FDA approved guidance for the current laboratory assay.  If heparin results are below expected values, and patient dosage has  been confirmed, suggest follow up testing of antithrombin III levels. Performed at [facility], [address] [place], [place] [address]   Heparin level (unfractionated)     Status: Abnormal   Collection Time: 03/10/23 12:59 AM  Result Value Ref Range   Heparin Unfractionated 0.22 (L) 0.30 - 0.70 IU/mL    Comment: (NOTE) The clinical reportable range upper limit is being lowered to >1.10 to align with the FDA approved guidance for the current laboratory assay.  If heparin results are below expected values, and patient dosage has  been confirmed, suggest follow up testing of antithrombin III levels. Performed at [facility], [address] [place], [place] [address]   Basic metabolic panel     Status: Abnormal   Collection Time: 03/10/23 10:36 AM  Result Value Ref Range   Sodium 132 (L) 135 - 145 mmol/L   Potassium 3.4 (L) 3.5 - 5.1 mmol/L   Chloride 99 98 - 111 mmol/L   CO2 26 22 - 32 mmol/L   Glucose, Bld 150 (H) 70 - 99 mg/dL    Comment: Glucose reference range applies only to samples taken after fasting for at least 8 hours.   BUN 13 8 - 23 mg/dL   Creatinine, Ser 4.40 0.44 - 1.00 mg/dL   Calcium 8.1 (L) 8.9 - 10.3 mg/dL   GFR, Estimated >34 >74 mL/min    Comment: (NOTE) Calculated using the  CKD-EPI Creatinine Equation (2021)    Anion gap 7 5 - 15    Comment: Performed at [facility], [address] [place], [place] [address]  Magnesium     Status: None   Collection Time: 03/10/23 10:36 AM  Result Value Ref Range   Magnesium 2.0 1.7 - 2.4 mg/dL    Comment: Performed at [facility], [address] [place], [place] [address]  CBC     Status: Abnormal   Collection Time: 03/10/23 10:36 AM  Result Value Ref Range   WBC 13.1 (H) 4.0 - 10.5 K/uL   RBC 2.98 (L) 3.87 - 5.11 MIL/uL   Hemoglobin 9.8 (L) 12.0 - 15.0 g/dL   HCT 09.8 (L) 11.9 - 14.7 %   MCV 98.3 80.0 - 100.0 fL   MCH 32.9 26.0 - 34.0 pg   MCHC 33.4 30.0 - 36.0 g/dL   RDW 82.9 56.2 - 13.0 %   Platelets 195 150 - 400 K/uL   nRBC 0.0 0.0 - 0.2 %    Comment: Performed at [facility], [address] [place], [place] [address]  Heparin level (unfractionated)     Status: Abnormal   Collection Time: 03/10/23 10:36 AM  Result Value Ref Range   Heparin Unfractionated 0.18 (L) 0.30 - 0.70 IU/mL    Comment: (NOTE) The clinical reportable range upper limit is being lowered to >1.10 to align with the FDA approved guidance for the current laboratory assay.  If heparin results are below expected values, and patient dosage has  been confirmed, suggest follow up testing of antithrombin III levels. Performed at [facility], [address] [place], [place] [address]   Culture, blood (Routine X 2) w Reflex to ID Panel     Status: None   Collection Time: 03/10/23 12:17 PM   Specimen: BLOOD  Result Value Ref Range   Specimen Description BLOOD RIGHT ANTECUBITAL    Special Requests      BOTTLES DRAWN AEROBIC AND ANAEROBIC Blood Culture results may not be optimal due to an excessive volume of blood received in culture bottles   Culture      NO GROWTH 5 DAYS Performed at [facility], [address] [place], [place] [address]    Report Status 03/15/2023 FINAL   Culture, blood (Routine X 2) w Reflex to ID Panel     Status: None   Collection Time: 03/10/23 12:25 PM   Specimen: BLOOD  Result Value Ref Range   Specimen Description BLOOD BLOOD RIGHT HAND    Special Requests      BOTTLES DRAWN AEROBIC AND ANAEROBIC Blood Culture results may not be optimal due to an inadequate volume of blood received in culture bottles   Culture      NO GROWTH 5 DAYS Performed at [facility], [address] [place], [place] [address]    Report Status 03/15/2023 FINAL   Urinalysis, w/ Reflex to Culture (Infection Suspected) -Urine, Random     Status: Abnormal   Collection Time: 03/10/23 12:33 PM  Result Value Ref Range   Specimen Source URINE, RANDOM    Color, Urine AMBER (A) YELLOW    Comment: BIOCHEMICALS MAY BE AFFECTED BY COLOR   APPearance CLOUDY (A) CLEAR   Specific Gravity, Urine 1.026 1.005 - 1.030   pH 5.0 5.0 - 8.0   Glucose, UA NEGATIVE NEGATIVE mg/dL   Hgb urine dipstick MODERATE (A) NEGATIVE   Bilirubin Urine NEGATIVE NEGATIVE   Ketones, ur  NEGATIVE NEGATIVE mg/dL   Protein, ur 30 (A) NEGATIVE mg/dL   Nitrite NEGATIVE NEGATIVE   Leukocytes,Ua NEGATIVE NEGATIVE   RBC / HPF 6-10 0 - 5 RBC/hpf   WBC, UA 0-5 0 - 5 WBC/hpf    Comment:        Reflex urine culture not performed if WBC <=10, OR if Squamous epithelial cells >5. If Squamous epithelial cells >5 suggest  recollection.    Bacteria, UA FEW (A) NONE SEEN   Squamous Epithelial / HPF 0-5 0 - 5 /HPF   Mucus PRESENT    Hyaline Casts, UA PRESENT    Amorphous Crystal PRESENT     Comment: Performed at [facility], [address] [place], [place] [address]  Basic metabolic panel     Status: Abnormal   Collection Time: 03/11/23  5:55 AM  Result Value Ref Range   Sodium 132 (L) 135 - 145 mmol/L   Potassium 3.5 3.5 - 5.1 mmol/L   Chloride 101 98 - 111 mmol/L   CO2 25 22 - 32 mmol/L   Glucose, Bld 130 (H) 70 - 99 mg/dL    Comment: Glucose reference range applies only to samples taken after fasting for at least 8 hours.   BUN 13 8 - 23 mg/dL   Creatinine, Ser 6.04 0.44 - 1.00 mg/dL   Calcium 8.1 (L) 8.9 - 10.3 mg/dL   GFR, Estimated >54 >09 mL/min    Comment: (NOTE) Calculated using the CKD-EPI Creatinine Equation (2021)    Anion gap 6 5 - 15    Comment: Performed at [facility], [address] [place], [place] [address]  Magnesium     Status: None   Collection Time: 03/11/23  5:55 AM  Result Value Ref Range   Magnesium 1.9 1.7 - 2.4 mg/dL    Comment: Performed at [facility], [address] [place], [place] [address]  Procalcitonin     Status: None   Collection Time: 03/11/23  5:55 AM  Result Value Ref Range   Procalcitonin 0.50 ng/mL    Comment:        Interpretation: PCT (Procalcitonin) <= 0.5 ng/mL: Systemic infection (sepsis) is not likely. Local bacterial infection is possible. (NOTE)       Sepsis PCT Algorithm           Lower Respiratory Tract                                      Infection PCT Algorithm    ----------------------------     ----------------------------         PCT < 0.25 ng/mL                PCT < 0.10 ng/mL          Strongly encourage             Strongly discourage   discontinuation of antibiotics    initiation of antibiotics    ----------------------------     -----------------------------       PCT 0.25 - 0.50 ng/mL             PCT 0.10 - 0.25 ng/mL               OR       >80% decrease in PCT            Discourage initiation of  antibiotics      Encourage discontinuation           of antibiotics    ----------------------------     -----------------------------         PCT >= 0.50 ng/mL              PCT 0.26 - 0.50 ng/mL               AND        <80% decrease in PCT             Encourage initiation of                                             antibiotics       Encourage continuation           of antibiotics    ----------------------------     -----------------------------        PCT >= 0.50 ng/mL                  PCT > 0.50 ng/mL               AND         increase in PCT                  Strongly encourage                                      initiation of antibiotics    Strongly encourage escalation           of antibiotics                                     -----------------------------                                           PCT <= 0.25 ng/mL                                                 OR                                        > 80% decrease in PCT                                      Discontinue / Do not initiate                                             antibiotics  Performed at [facility], [address], [place], [place] [address]   CBC     Status: Abnormal   Collection Time: 03/11/23 12:35  PM  Result Value Ref Range   WBC 16.3 (H) 4.0 - 10.5 K/uL   RBC 2.51 (L) 3.87 - 5.11 MIL/uL   Hemoglobin 8.3 (L) 12.0 - 15.0 g/dL   HCT 44.0 (L) 34.7 - 42.5 %   MCV 95.6 80.0 - 100.0 fL   MCH 33.1 26.0 - 34.0 pg   MCHC 34.6 30.0 - 36.0 g/dL   RDW 95.6 38.7 - 56.4 %   Platelets 200 150 - 400 K/uL   nRBC 0.0 0.0 - 0.2 %    Comment: Performed at [facility], [address], [place], [place] [address]  Basic metabolic panel     Status: Abnormal   Collection Time: 03/12/23  2:44 AM  Result Value Ref Range   Sodium 128 (L) 135  - 145 mmol/L   Potassium 3.2 (L) 3.5 - 5.1 mmol/L   Chloride 97 (L) 98 - 111 mmol/L   CO2 23 22 - 32 mmol/L   Glucose, Bld 100 (H) 70 - 99 mg/dL    Comment: Glucose reference range applies only to samples taken after fasting for at least 8 hours.   BUN 13 8 - 23 mg/dL   Creatinine, Ser 1.88 0.44 - 1.00 mg/dL   Calcium 7.9 (L) 8.9 - 10.3 mg/dL   GFR, Estimated >41 >66 mL/min    Comment: (NOTE) Calculated using the CKD-EPI Creatinine Equation (2021)    Anion gap 8 5 - 15    Comment: Performed at [facility], [address] [place] [address] [place], [place] [address]  Magnesium     Status: None   Collection Time: 03/12/23  2:44 AM  Result Value Ref Range   Magnesium 1.9 1.7 - 2.4 mg/dL    Comment: Performed at [facility], [address] [place], [place] [address]  CBC     Status: Abnormal   Collection Time: 03/13/23  7:06 AM  Result Value Ref Range   WBC 12.3 (H) 4.0 - 10.5 K/uL   RBC 2.45 (L) 3.87 - 5.11 MIL/uL   Hemoglobin 8.1 (L) 12.0 - 15.0 g/dL   HCT 32.3 (L) 55.7 - 32.2 %   MCV 97.1 80.0 - 100.0 fL   MCH 33.1 26.0 - 34.0 pg   MCHC 34.0 30.0 - 36.0 g/dL   RDW 02.5 42.7 - 06.2 %   Platelets 257 150 - 400 K/uL   nRBC 0.0 0.0 - 0.2 %    Comment: Performed at [facility], [address] [place] [address], [place], [place] [address]  Basic metabolic panel     Status: Abnormal   Collection Time: 03/13/23  7:06 AM  Result Value Ref Range   Sodium 130 (L) 135 - 145 mmol/L   Potassium 3.7 3.5 - 5.1 mmol/L   Chloride 98 98 - 111 mmol/L   CO2 23 22 - 32 mmol/L   Glucose, Bld 107 (H) 70 - 99 mg/dL    Comment: Glucose reference range applies only to samples taken after fasting for at least 8 hours.   BUN 12 8 - 23 mg/dL   Creatinine, Ser 3.15 0.44 - 1.00 mg/dL   Calcium 8.2 (L) 8.9 - 10.3 mg/dL   GFR, Estimated >17 >61 mL/min    Comment: (NOTE) Calculated using the CKD-EPI Creatinine Equation (2021)    Anion gap 9 5 - 15    Comment: Performed at [facility], [address] [place], [place] [address]  VAS [place] ABI WITH/WO TBI     Status: None  Collection Time: 04/19/23 11:20 AM  Result Value Ref Range   Right ABI .92    Left ABI .96       Assessment & Plan:  Increase gabapentin to 600 mg 3 times a day.  Referral to pain clinic.  Patient will also see the podiatrist and wound clinic. Problem List Items Addressed This Visit     Essential hypertension   Peripheral vascular disease (HCC)   Mixed hyperlipidemia   Prediabetes   Ischemic foot   Other Visit Diagnoses       Leg pain, anterior, left    -  Primary   Relevant Medications   gabapentin (NEURONTIN) 600 MG tablet   Other Relevant Orders   Ambulatory referral to Pain Clinic   Ambulatory referral to Podiatry   Ambulatory referral to Pain Clinic     GAD (generalized anxiety disorder)       Relevant Medications   DULoxetine (CYMBALTA) 30 MG capsule       Follow up 1 month.  Total time spent: 30 minutes  Margaretann Loveless, MD  04/29/2023   This document may have been prepared by [facility] Voice Recognition software and as such may include unintentional dictation errors.

## 2023-05-01 NOTE — Telephone Encounter (Signed)
Patient came to see Dr. Welton Flakes. Can complete

## 2023-05-04 DIAGNOSIS — M25461 Effusion, right knee: Secondary | ICD-10-CM | POA: Diagnosis not present

## 2023-05-04 DIAGNOSIS — I119 Hypertensive heart disease without heart failure: Secondary | ICD-10-CM | POA: Diagnosis not present

## 2023-05-04 DIAGNOSIS — J181 Lobar pneumonia, unspecified organism: Secondary | ICD-10-CM | POA: Diagnosis not present

## 2023-05-04 DIAGNOSIS — L89890 Pressure ulcer of other site, unstageable: Secondary | ICD-10-CM | POA: Diagnosis not present

## 2023-05-04 DIAGNOSIS — I2510 Atherosclerotic heart disease of native coronary artery without angina pectoris: Secondary | ICD-10-CM | POA: Diagnosis not present

## 2023-05-04 DIAGNOSIS — M130 Polyarthritis, unspecified: Secondary | ICD-10-CM | POA: Diagnosis not present

## 2023-05-04 DIAGNOSIS — T82898D Other specified complication of vascular prosthetic devices, implants and grafts, subsequent encounter: Secondary | ICD-10-CM | POA: Diagnosis not present

## 2023-05-04 DIAGNOSIS — J440 Chronic obstructive pulmonary disease with acute lower respiratory infection: Secondary | ICD-10-CM | POA: Diagnosis not present

## 2023-05-04 DIAGNOSIS — L89620 Pressure ulcer of left heel, unstageable: Secondary | ICD-10-CM | POA: Diagnosis not present

## 2023-05-05 ENCOUNTER — Telehealth: Payer: Self-pay

## 2023-05-05 ENCOUNTER — Telehealth: Payer: Self-pay | Admitting: Family

## 2023-05-05 NOTE — Telephone Encounter (Signed)
 Ana from [facility], left VM that she has concerns about patient left leg wound.   Callback # [phone]

## 2023-05-05 NOTE — Telephone Encounter (Signed)
 Home health nurse called and is requesting a call back regarding pts wound on her foot- she came to change the dressing and it has a strong odor and has drainage she is wanting to know what is going to be done about this-please advise

## 2023-05-10 ENCOUNTER — Encounter: Payer: Self-pay | Admitting: Emergency Medicine

## 2023-05-10 ENCOUNTER — Inpatient Hospital Stay
Admission: EM | Admit: 2023-05-10 | Discharge: 2023-05-19 | DRG: 240 | Disposition: A | Discharge status: Skilled Nursing Facility | Payer: Medicare HMO | Attending: Internal Medicine | Admitting: Internal Medicine

## 2023-05-10 ENCOUNTER — Emergency Department: Payer: Medicare HMO

## 2023-05-10 ENCOUNTER — Other Ambulatory Visit: Payer: Self-pay

## 2023-05-10 DIAGNOSIS — Z888 Allergy status to other drugs, medicaments and biological substances status: Secondary | ICD-10-CM

## 2023-05-10 DIAGNOSIS — Z7902 Long term (current) use of antithrombotics/antiplatelets: Secondary | ICD-10-CM

## 2023-05-10 DIAGNOSIS — J449 Chronic obstructive pulmonary disease, unspecified: Secondary | ICD-10-CM | POA: Diagnosis present

## 2023-05-10 DIAGNOSIS — D72829 Elevated white blood cell count, unspecified: Secondary | ICD-10-CM | POA: Diagnosis present

## 2023-05-10 DIAGNOSIS — F17210 Nicotine dependence, cigarettes, uncomplicated: Secondary | ICD-10-CM | POA: Diagnosis present

## 2023-05-10 DIAGNOSIS — G8918 Other acute postprocedural pain: Secondary | ICD-10-CM | POA: Diagnosis not present

## 2023-05-10 DIAGNOSIS — Z7982 Long term (current) use of aspirin: Secondary | ICD-10-CM

## 2023-05-10 DIAGNOSIS — I70262 Atherosclerosis of native arteries of extremities with gangrene, left leg: Secondary | ICD-10-CM | POA: Diagnosis not present

## 2023-05-10 DIAGNOSIS — F17200 Nicotine dependence, unspecified, uncomplicated: Secondary | ICD-10-CM | POA: Diagnosis present

## 2023-05-10 DIAGNOSIS — I10 Essential (primary) hypertension: Secondary | ICD-10-CM | POA: Diagnosis present

## 2023-05-10 DIAGNOSIS — K219 Gastro-esophageal reflux disease without esophagitis: Secondary | ICD-10-CM | POA: Diagnosis present

## 2023-05-10 DIAGNOSIS — I708 Atherosclerosis of other arteries: Secondary | ICD-10-CM | POA: Diagnosis not present

## 2023-05-10 DIAGNOSIS — L97529 Non-pressure chronic ulcer of other part of left foot with unspecified severity: Secondary | ICD-10-CM | POA: Diagnosis present

## 2023-05-10 DIAGNOSIS — M109 Gout, unspecified: Secondary | ICD-10-CM | POA: Diagnosis present

## 2023-05-10 DIAGNOSIS — Z803 Family history of malignant neoplasm of breast: Secondary | ICD-10-CM | POA: Diagnosis not present

## 2023-05-10 DIAGNOSIS — F32A Depression, unspecified: Secondary | ICD-10-CM | POA: Diagnosis present

## 2023-05-10 DIAGNOSIS — K5900 Constipation, unspecified: Secondary | ICD-10-CM | POA: Diagnosis present

## 2023-05-10 DIAGNOSIS — M79672 Pain in left foot: Secondary | ICD-10-CM | POA: Diagnosis not present

## 2023-05-10 DIAGNOSIS — M86172 Other acute osteomyelitis, left ankle and foot: Secondary | ICD-10-CM | POA: Diagnosis not present

## 2023-05-10 DIAGNOSIS — Z89411 Acquired absence of right great toe: Secondary | ICD-10-CM | POA: Diagnosis not present

## 2023-05-10 DIAGNOSIS — L089 Local infection of the skin and subcutaneous tissue, unspecified: Secondary | ICD-10-CM

## 2023-05-10 DIAGNOSIS — Z95828 Presence of other vascular implants and grafts: Secondary | ICD-10-CM | POA: Diagnosis not present

## 2023-05-10 DIAGNOSIS — Z89421 Acquired absence of other right toe(s): Secondary | ICD-10-CM | POA: Diagnosis not present

## 2023-05-10 DIAGNOSIS — D509 Iron deficiency anemia, unspecified: Secondary | ICD-10-CM | POA: Diagnosis present

## 2023-05-10 DIAGNOSIS — E785 Hyperlipidemia, unspecified: Secondary | ICD-10-CM | POA: Diagnosis present

## 2023-05-10 DIAGNOSIS — M869 Osteomyelitis, unspecified: Principal | ICD-10-CM | POA: Diagnosis present

## 2023-05-10 DIAGNOSIS — I739 Peripheral vascular disease, unspecified: Secondary | ICD-10-CM | POA: Diagnosis present

## 2023-05-10 DIAGNOSIS — Z79899 Other long term (current) drug therapy: Secondary | ICD-10-CM | POA: Diagnosis not present

## 2023-05-10 DIAGNOSIS — E1169 Type 2 diabetes mellitus with other specified complication: Secondary | ICD-10-CM | POA: Diagnosis not present

## 2023-05-10 DIAGNOSIS — I96 Gangrene, not elsewhere classified: Secondary | ICD-10-CM | POA: Diagnosis present

## 2023-05-10 DIAGNOSIS — Z96641 Presence of right artificial hip joint: Secondary | ICD-10-CM | POA: Diagnosis present

## 2023-05-10 DIAGNOSIS — I998 Other disorder of circulatory system: Secondary | ICD-10-CM | POA: Diagnosis present

## 2023-05-10 DIAGNOSIS — M868X7 Other osteomyelitis, ankle and foot: Secondary | ICD-10-CM | POA: Diagnosis present

## 2023-05-10 DIAGNOSIS — Z7401 Bed confinement status: Secondary | ICD-10-CM | POA: Diagnosis not present

## 2023-05-10 DIAGNOSIS — E119 Type 2 diabetes mellitus without complications: Secondary | ICD-10-CM | POA: Diagnosis not present

## 2023-05-10 DIAGNOSIS — R531 Weakness: Secondary | ICD-10-CM | POA: Diagnosis not present

## 2023-05-10 DIAGNOSIS — Z9889 Other specified postprocedural states: Secondary | ICD-10-CM | POA: Diagnosis not present

## 2023-05-10 DIAGNOSIS — I70201 Unspecified atherosclerosis of native arteries of extremities, right leg: Secondary | ICD-10-CM | POA: Diagnosis not present

## 2023-05-10 LAB — CBC WITH DIFFERENTIAL/PLATELET
Abs Immature Granulocytes: 0.06 10*3/uL (ref 0.00–0.07)
Basophils Absolute: 0 10*3/uL (ref 0.0–0.1)
Basophils Relative: 0 %
Eosinophils Absolute: 0 10*3/uL (ref 0.0–0.5)
Eosinophils Relative: 0 %
HCT: 35 % — ABNORMAL LOW (ref 36.0–46.0)
Hemoglobin: 11.6 g/dL — ABNORMAL LOW (ref 12.0–15.0)
Immature Granulocytes: 1 %
Lymphocytes Relative: 16 %
Lymphs Abs: 1.5 10*3/uL (ref 0.7–4.0)
MCH: 31.1 pg (ref 26.0–34.0)
MCHC: 33.1 g/dL (ref 30.0–36.0)
MCV: 93.8 fL (ref 80.0–100.0)
Monocytes Absolute: 0.8 10*3/uL (ref 0.1–1.0)
Monocytes Relative: 8 %
Neutro Abs: 6.9 10*3/uL (ref 1.7–7.7)
Neutrophils Relative %: 75 %
Platelets: 408 10*3/uL — ABNORMAL HIGH (ref 150–400)
RBC: 3.73 MIL/uL — ABNORMAL LOW (ref 3.87–5.11)
RDW: 14.9 % (ref 11.5–15.5)
WBC: 9.3 10*3/uL (ref 4.0–10.5)
nRBC: 0 % (ref 0.0–0.2)

## 2023-05-10 LAB — COMPREHENSIVE METABOLIC PANEL
ALT: 11 U/L (ref 0–44)
AST: 20 U/L (ref 15–41)
Albumin: 3.3 g/dL — ABNORMAL LOW (ref 3.5–5.0)
Alkaline Phosphatase: 64 U/L (ref 38–126)
Anion gap: 11 (ref 5–15)
BUN: 22 mg/dL (ref 8–23)
CO2: 26 mmol/L (ref 22–32)
Calcium: 9.4 mg/dL (ref 8.9–10.3)
Chloride: 98 mmol/L (ref 98–111)
Creatinine, Ser: 0.92 mg/dL (ref 0.44–1.00)
GFR, Estimated: >60 mL/min (ref >60)
Glucose, Bld: 106 mg/dL — ABNORMAL HIGH (ref 70–99)
Potassium: 3.9 mmol/L (ref 3.5–5.1)
Sodium: 135 mmol/L (ref 135–145)
Total Bilirubin: 0.9 mg/dL (ref 0.0–1.2)
Total Protein: 8.8 g/dL — ABNORMAL HIGH (ref 6.5–8.1)

## 2023-05-10 LAB — LACTIC ACID, PLASMA
Lactic Acid, Venous: 2.3 mmol/L (ref 0.5–1.9)
Lactic Acid, Venous: 1.9 mmol/L (ref 0.5–1.9)

## 2023-05-10 LAB — SEDIMENTATION RATE: Sed Rate: 117 mm/h — ABNORMAL HIGH (ref 0–30)

## 2023-05-10 MED ORDER — MORPHINE SULFATE (PF) 2 MG/ML IV SOLN
2.0000 mg | Freq: Once | INTRAVENOUS | Status: AC
  Administered 2023-05-10: 2 mg via INTRAVENOUS
  Filled 2023-05-10: qty 1

## 2023-05-10 MED ORDER — ALBUTEROL SULFATE (2.5 MG/3ML) 0.083% IN NEBU: 2.5000 mg | INHALATION_SOLUTION | RESPIRATORY_TRACT | Status: DC | PRN

## 2023-05-10 MED ORDER — HYDRALAZINE HCL 20 MG/ML IJ SOLN: 5.0000 mg | INTRAMUSCULAR | Status: DC | PRN

## 2023-05-10 MED ORDER — OXYCODONE HCL 5 MG PO TABS
5.0000 mg | ORAL_TABLET | Freq: Four times a day (QID) | ORAL | Status: DC | PRN
  Administered 2023-05-12 – 2023-05-13 (×3): 10 mg via ORAL
  Filled 2023-05-10 (×3): qty 2

## 2023-05-10 MED ORDER — LACTATED RINGERS IV BOLUS
1000.0000 mL | Freq: Once | INTRAVENOUS | Status: AC
  Administered 2023-05-10: 1000 mL via INTRAVENOUS

## 2023-05-10 MED ORDER — PIPERACILLIN-TAZOBACTAM 3.375 G IVPB
3.3750 g | Freq: Three times a day (TID) | INTRAVENOUS | Status: DC
  Administered 2023-05-11 – 2023-05-17 (×20): 3.375 g via INTRAVENOUS
  Filled 2023-05-10 (×21): qty 50

## 2023-05-10 MED ORDER — VANCOMYCIN HCL 1500 MG/300ML IV SOLN
1500.0000 mg | Freq: Once | INTRAVENOUS | Status: AC
  Administered 2023-05-10: 1500 mg via INTRAVENOUS
  Filled 2023-05-10: qty 300

## 2023-05-10 MED ORDER — NICOTINE 21 MG/24HR TD PT24
21.0000 mg | MEDICATED_PATCH | Freq: Every day | TRANSDERMAL | Status: DC
  Administered 2023-05-11 – 2023-05-19 (×9): 21 mg via TRANSDERMAL
  Filled 2023-05-10 (×9): qty 1

## 2023-05-10 MED ORDER — ONDANSETRON HCL 4 MG/2ML IJ SOLN
4.0000 mg | Freq: Three times a day (TID) | INTRAMUSCULAR | Status: DC | PRN
  Administered 2023-05-16: 4 mg via INTRAVENOUS
  Filled 2023-05-10: qty 2

## 2023-05-10 MED ORDER — VANCOMYCIN HCL 1250 MG/250ML IV SOLN
1250.0000 mg | INTRAVENOUS | Status: DC
  Administered 2023-05-11 – 2023-05-14 (×4): 1250 mg via INTRAVENOUS
  Filled 2023-05-10 (×5): qty 250

## 2023-05-10 MED ORDER — DM-GUAIFENESIN ER 30-600 MG PO TB12: 1.0000 | ORAL_TABLET | Freq: Two times a day (BID) | ORAL | Status: DC | PRN

## 2023-05-10 MED ORDER — ACETAMINOPHEN 325 MG PO TABS: 650.0000 mg | ORAL_TABLET | Freq: Four times a day (QID) | ORAL | Status: DC | PRN

## 2023-05-10 MED ORDER — HEPARIN SODIUM (PORCINE) 5000 UNIT/ML IJ SOLN
5000.0000 [IU] | Freq: Three times a day (TID) | INTRAMUSCULAR | Status: DC
  Administered 2023-05-11 – 2023-05-17 (×16): 5000 [IU] via SUBCUTANEOUS
  Filled 2023-05-10 (×16): qty 1

## 2023-05-10 NOTE — ED Notes (Signed)
[address] informed of lactic of 2.3

## 2023-05-10 NOTE — Progress Notes (Signed)
Pharmacy Antibiotic Note  Jackie Macias is a 68 y.o. female admitted on 05/10/2023 with  Osteomyelitis .  Pharmacy has been consulted for Vancomycin and Zosyn dosing.  -L foot gangrene, purulent drainage  -Hx PVD, toe amputations  Plan: -Will order Vancomycin 1500mg  IV x 1 as Loading dose in ED Will continue with Vancomycin 1250 mg IV Q q24h hrs. Goal AUC 400-550. Expected AUC: 521 SCr used: 0.92 Cmin 12.1  -Will order Zosyn 3.375 gm IV q8h   Follow renal fxn, cultures, length of therapy, etc     Height: 5\' 6"  (167.6 cm) Weight: 66.7 kg (147 lb) IBW/kg (Calculated) : 59.3  Temp (24hrs), Avg:97.8 F (36.6 C), Min:97.8 F (36.6 C), Max:97.8 F (36.6 C)  Recent Labs  Lab 05/10/23 1624 05/10/23 2050  WBC 9.3  --   CREATININE 0.92  --   LATICACIDVEN 2.3* 1.9    Estimated Creatinine Clearance: 54.8 mL/min (by C-G formula based on SCr of 0.92 mg/dL).    Allergies  Allergen Reactions   Lisinopril-Hydrochlorothiazide Other (See Comments)    Antimicrobials this admission: Vancomycin 2/11 >>   Zosyn 2/11 >>    Dose adjustments this admission:    Microbiology results: 2/11 BCx: pending   UCx:      Sputum:      MRSA PCR:    Thank you for allowing pharmacy to be a part of this patient's care.  Bari Mantis PharmD Clinical Pharmacist 05/10/2023

## 2023-05-10 NOTE — ED Notes (Signed)
Note from lobby: Pt escorted by [facility] nurses for gangrene to L foot, unknown for long, unknown VS. Pt in wheelchair.

## 2023-05-10 NOTE — ED Triage Notes (Signed)
Patient to ED via POV from [facility] for left foot amputation. States podiatrist sent her over due to entire foot gangrene. States foot as been gangrene for a few weeks. States she had at home wound care of the past few weeks but wasn't getting better.

## 2023-05-10 NOTE — H&P (Signed)
History and Physical    Jackie Macias JXB:[id number] DOB: [birth year] DOA: 05/10/2023  Referring MD/NP/PA:   PCP: Miki Kins, FNP   Patient coming from:  The patient is coming from home.     Chief Complaint: left foot pain wound  HPI: Jackie Macias is a 68 y.o. female with medical history significant of chronic left foot wound,  PVD, HTN, HLD, COPD, depression, gout, anemia, tobacco abuse, who presents with left foot pain and wound.   Pt has chronic left foot wound, which has been progressively worsening the past 3 weeks. She has at home wound care of the past few weeks but wasn't getting better. She has worsening pain in her left foot, which is constant, sharp, moderate, nonradiating. Pt was seen by Dr. Ether Griffins of podiatry today and found that pt has entire foot gangrene and sent to hospital for further evaluation and treatment.  Dr. Ether Griffins recommends left foot amputation.  Patient dose not has fever and chills.  No chest pain, cough, SOB.  No nausea, vomiting, abdominal pain.  Patient was constipated, and just had 1 episode of loose stool bowel movement today.  No more diarrhea after that.  No symptoms of UTI.  Data reviewed independently and ED Course: pt was found to have WBC 9.3, GFR> 60, lactic acid 2.3 -->1.9.  Temperature normal, blood pressure 177/76, heart rate 84, RR 18, oxygen saturation 98% on room air.  X-ray of left foot showed osteomyelitis.  Patient is admitted to MedSurg bed as inpatient.  X-ray of left foot: 1. Soft tissue thickening and irregularity of the great toe with underlying cortical irregularity of the great toe distal phalangeal tuft, suspicious for osteomyelitis. 2. Soft tissue thickening with subcutaneous emphysema of the plantar foot, likely emphysematous infection. Cortical irregularity and discontinuity along the lateral plantar aspect of the cuboid, suspicious for osteomyelitis. 3. Exposed bony fourth distal phalanx. 4. Soft tissue ulceration of  the distal small toe without definite underlying cortical lucency. 5. Increased cortical thickening of the fourth metatarsal, which may be reactive or reflect osteomyelitis.     EKG:  Not done in ED, will get one.   Review of Systems:   General: no fevers, chills, no body weight gain, has fatigue HEENT: no blurry vision, hearing changes or sore throat Respiratory: no dyspnea, coughing, wheezing CV: no chest pain, no palpitations GI: no nausea, vomiting, abdominal pain, diarrhea, constipation GU: no dysuria, burning on urination, increased urinary frequency, hematuria  Ext: has left foot wound Neuro: no unilateral weakness, numbness, or tingling, no vision change or hearing loss Skin: no rash, no skin tear. MSK: No muscle spasm, no deformity, no limitation of range of movement in spin Heme: No easy bruising.  Travel history: No recent long distant travel.   Allergy:  Allergies  Allergen Reactions   Lisinopril-Hydrochlorothiazide Other (See Comments)    Past Medical History:  Diagnosis Date   Arthritis    Bronchitis    COPD (chronic obstructive pulmonary disease) (HCC)    GERD (gastroesophageal reflux disease)    Gout    Hypertension     Past Surgical History:  Procedure Laterality Date   AMPUTATION Right 07/29/2017   Procedure: AMPUTATION DIGIT/ 13086;  Surgeon: Linus Galas, DPM;  Location: [facility];  Service: Podiatry;  Laterality: Right;   AMPUTATION TOE Right 02/18/2021   Procedure: AMPUTATION TOE-GREAT TOE;  Surgeon: Gwyneth Revels, DPM;  Location: [facility];  Service: Podiatry;  Laterality: Right;   CHOLECYSTECTOMY     facial  biopsy Right    JOINT REPLACEMENT Right 12/2016   THR   LOWER EXTREMITY ANGIOGRAPHY Right 09/27/2017   Procedure: LOWER EXTREMITY ANGIOGRAPHY;  Surgeon: Renford Dills, MD;  Location: [facility];  Service: Cardiovascular;  Laterality: Right;   LOWER EXTREMITY ANGIOGRAPHY Right 06/17/2020   Procedure: LOWER EXTREMITY ANGIOGRAPHY;   Surgeon: Renford Dills, MD;  Location: [facility];  Service: Cardiovascular;  Laterality: Right;   LOWER EXTREMITY ANGIOGRAPHY Right 02/18/2021   Procedure: LOWER EXTREMITY ANGIOGRAPHY;  Surgeon: Renford Dills, MD;  Location: [facility];  Service: Cardiovascular;  Laterality: Right;   LOWER EXTREMITY ANGIOGRAPHY Left 09/07/2022   Procedure: Lower Extremity Angiography;  Surgeon: Renford Dills, MD;  Location: [facility];  Service: Cardiovascular;  Laterality: Left;   LOWER EXTREMITY ANGIOGRAPHY Left 03/07/2023   Procedure: Lower Extremity Angiography;  Surgeon: Annice Needy, MD;  Location: [facility];  Service: Cardiovascular;  Laterality: Left;   LOWER EXTREMITY ANGIOGRAPHY Left 03/08/2023   Procedure: Lower Extremity Angiography;  Surgeon: Renford Dills, MD;  Location: [facility];  Service: Cardiovascular;  Laterality: Left;   TONSILLECTOMY     TOTAL HIP ARTHROPLASTY Right 01/17/2017   Procedure: TOTAL HIP ARTHROPLASTY ANTERIOR APPROACH;  Surgeon: Lyndle Herrlich, MD;  Location: [facility];  Service: Orthopedics;  Laterality: Right;    Social History:  reports that she has been smoking cigarettes. She has a 50 pack-year smoking history. She has never used smokeless tobacco. She reports current alcohol use of about 3.0 standard drinks of alcohol per week. She reports that she does not use drugs.  Family History:  Family History  Problem Relation Age of Onset   Breast cancer Sister 103   Breast cancer Other      Prior to Admission medications   Medication Sig Start Date End Date Taking? Authorizing Provider  acetaminophen (TYLENOL) 325 MG tablet Take 2 tablets (650 mg total) by mouth every 6 (six) hours as needed for mild pain (pain score 1-3) (or Fever >/= 101). 03/14/23   Alford Highland, MD  albuterol (VENTOLIN HFA) 108 (90 Base) MCG/ACT inhaler Inhale 2 puffs into the lungs every 6 (six) hours as needed for wheezing or  shortness of breath. 11/22/22   Miki Kins, FNP  aspirin EC 81 MG tablet Take 1 tablet (81 mg total) by mouth daily. Swallow whole. 03/15/23   Alford Highland, MD  clopidogrel (PLAVIX) 75 MG tablet Take 1 tablet (75 mg total) by mouth daily. 03/14/23   Alford Highland, MD  doxycycline (VIBRA-TABS) 100 MG tablet Take 1 tablet (100 mg total) by mouth every 12 (twelve) hours. 03/14/23   Alford Highland, MD  DULoxetine (CYMBALTA) 30 MG capsule Take 1 capsule (30 mg total) by mouth daily. 04/29/23 04/28/24  Margaretann Loveless, MD  ferrous sulfate 325 (65 FE) MG tablet Take 1 tablet (325 mg total) by mouth daily with breakfast. 03/15/23   Alford Highland, MD  furosemide (LASIX) 20 MG tablet Take 1 tablet (20 mg total) by mouth daily as needed for edema. 03/14/23   Alford Highland, MD  gabapentin (NEURONTIN) 600 MG tablet Take 1 tablet (600 mg total) by mouth daily. 04/29/23 04/28/24  Margaretann Loveless, MD  ipratropium-albuterol (DUONEB) 0.5-2.5 (3) MG/3ML SOLN Take 3 mLs by nebulization every 6 (six) hours as needed. 11/22/22   Miki Kins, FNP  oxyCODONE (OXY IR/ROXICODONE) 5 MG immediate release tablet Take 1-2 tablets (5-10 mg total) by  mouth every 6 (six) hours as needed for severe pain (pain score 7-10). 04/20/23   Miki Kins, FNP  pantoprazole (PROTONIX) 40 MG tablet Take 1 tablet (40 mg total) by mouth every morning. 11/22/22   Miki Kins, FNP  polyethylene glycol (MIRALAX / GLYCOLAX) 17 g packet Take 17 g by mouth daily. 03/14/23   Alford Highland, MD  rosuvastatin (CRESTOR) 20 MG tablet Take 1 tablet (20 mg total) by mouth daily. 11/22/22   Miki Kins, FNP  sodium chloride 1 g tablet Take 1 tablet (1 g total) by mouth 2 (two) times daily with a meal. 03/14/23   Alford Highland, MD  Vitamin D, Ergocalciferol, (DRISDOL) 1.25 MG (50000 UNIT) CAPS capsule Take 1 capsule (50,000 Units total) by mouth every 7 (seven) days. 11/23/22   Miki Kins, FNP    Physical  Exam: Vitals:   05/10/23 1620  BP: (!) 177/76  Pulse: 84  Resp: 18  Temp: 97.8 F (36.6 C)  TempSrc: Oral  SpO2: 98%  Weight: 66.7 kg  Height: 5\' 6"  (1.676 m)   General: Not in acute distress.  Dry mucous membrane HEENT:       Eyes: PERRL, EOMI, no jaundice       ENT: No discharge from the ears and nose, no pharynx injection, no tonsillar enlargement.        Neck: No JVD, no bruit, no mass felt. Heme: No neck lymph node enlargement. Cardiac: S1/S2, RRR, No murmurs, No gallops or rubs. Respiratory: No rales, wheezing, rhonchi or rubs. GI: Soft, nondistended, nontender, no rebound pain, no organomegaly, BS present. GU: No hematuria Ext: No pitting leg edema bilaterally. Has a large wound in left foot plantar area with discoloration and gangrenous change.  With foul smell.      Musculoskeletal: No joint deformities, No joint redness or warmth, no limitation of ROM in spin. Skin: No rashes.  Neuro: Alert, oriented X3, cranial nerves II-XII grossly intact, moves all extremities normally.   Labs on Admission: I have personally reviewed following labs and imaging studies  CBC: Recent Labs  Lab 05/10/23 1624  WBC 9.3  NEUTROABS 6.9  HGB 11.6*  HCT 35.0*  MCV 93.8  PLT 408*   Basic Metabolic Panel: Recent Labs  Lab 05/10/23 1624  NA 135  K 3.9  CL 98  CO2 26  GLUCOSE 106*  BUN 22  CREATININE 0.92  CALCIUM 9.4   GFR: Estimated Creatinine Clearance: 54.8 mL/min (by C-G formula based on SCr of 0.92 mg/dL). Liver Function Tests: Recent Labs  Lab 05/10/23 1624  AST 20  ALT 11  ALKPHOS 64  BILITOT 0.9  PROT 8.8*  ALBUMIN 3.3*   No results for input(s): "LIPASE", "AMYLASE" in the last 168 hours. No results for input(s): "AMMONIA" in the last 168 hours. Coagulation Profile: No results for input(s): "INR", "PROTIME" in the last 168 hours. Cardiac Enzymes: No results for input(s): "CKTOTAL", "CKMB", "CKMBINDEX", "TROPONINI" in the last 168 hours. BNP (last 3  results) No results for input(s): "PROBNP" in the last 8760 hours. HbA1C: No results for input(s): "HGBA1C" in the last 72 hours. CBG: No results for input(s): "GLUCAP" in the last 168 hours. Lipid Profile: No results for input(s): "CHOL", "HDL", "LDLCALC", "TRIG", "CHOLHDL", "LDLDIRECT" in the last 72 hours. Thyroid Function Tests: No results for input(s): "TSH", "T4TOTAL", "FREET4", "T3FREE", "THYROIDAB" in the last 72 hours. Anemia Panel: No results for input(s): "VITAMINB12", "FOLATE", "FERRITIN", "TIBC", "IRON", "RETICCTPCT" in the last 72 hours. Urine  analysis:    Component Value Date/Time   COLORURINE AMBER (A) 03/10/2023 1233   APPEARANCEUR CLOUDY (A) 03/10/2023 1233   LABSPEC 1.026 03/10/2023 1233   PHURINE 5.0 03/10/2023 1233   GLUCOSEU NEGATIVE 03/10/2023 1233   HGBUR MODERATE (A) 03/10/2023 1233   BILIRUBINUR NEGATIVE 03/10/2023 1233   KETONESUR NEGATIVE 03/10/2023 1233   PROTEINUR 30 (A) 03/10/2023 1233   NITRITE NEGATIVE 03/10/2023 1233   LEUKOCYTESUR NEGATIVE 03/10/2023 1233   Sepsis Labs: @LABRCNTIP (procalcitonin:4,lacticidven:4) )No results found for this or any previous visit (from the past 240 hours).   Radiological Exams on Admission:   Assessment/Plan Principal Problem:   Foot osteomyelitis, left (HCC) Active Problems:   Peripheral vascular disease (HCC)   Essential hypertension   HLD (hyperlipidemia)   Chronic obstructive pulmonary disease (COPD) (HCC)   Iron deficiency anemia   Depression   Tobacco use disorder   Assessment and Plan:   Foot osteomyelitis, left (HCC): no fever or leukocytosis.  Clinically not septic. Pt will need left foot amputation.   - will admit to med-surg as inpatient - Empiric antimicrobial treatment with vancomycin and zosyn - PRN Zofran for nausea, morphine and oxycodone for pain - Blood cultures x 2  - ESR and CRP - IVF: 1.5 L of NS bolus  - INR/PTT/type & screen - NPO after MN  Peripheral vascular disease  (HCC): Pt has history of ischemic left foot ulcer.  Had a CT angio done in December that showed solitary vessel runoff via reconstituted treating her [name] artery, left-sided dorsalis pedis artery is not identified.  No distal embolization noted.  She had a vascular ultrasound done on 21 January that showed left ABI is within normal limit.  Left toe brachial index is abnormal.  -Hold plavix -continue ASA and Crestor  Essential hypertension: Bp is 177/76 -IV hydralazine as needed  -Hold Lasix -start amlodipine 5 mg daily  HLD (hyperlipidemia) -Crestor  Chronic obstructive pulmonary disease (COPD) (HCC): stable -Bronchodilators and as needed Mucinex  Iron deficiency anemia: Hemoglobin stable 11.6 (8.1 on 03/13/2023) -Continue iron supplement  Depression -Continue home medications  Tobacco use disorder -Nicotine patch     DVT ppx: SQ Heparin      Code Status: Full code   Family Communication:    Yes, patient's niece    at bed side.       Disposition Plan:  Anticipate discharge back to previous environment  Consults called:  none (Dr. Follow sent patient to hospital)  Admission status and Level of care: Med-Surg:  as inpt        Dispo: The patient is from: Home              Anticipated d/c is to: Home              Anticipated d/c date is: 2 days              Patient currently is not medically stable to d/c.    Severity of Illness:  The appropriate patient status for this patient is INPATIENT. Inpatient status is judged to be reasonable and necessary in order to provide the required intensity of service to ensure the patient's safety. The patient's presenting symptoms, physical exam findings, and initial radiographic and laboratory data in the context of their chronic comorbidities is felt to place them at high risk for further clinical deterioration. Furthermore, it is not anticipated that the patient will be medically stable for discharge from the hospital within 2  midnights of admission.   *  I certify that at the point of admission it is my clinical judgment that the patient will require inpatient hospital care spanning beyond 2 midnights from the point of admission due to high intensity of service, high risk for further deterioration and high frequency of surveillance required.*       Date of Service 05/11/2023    Lorretta Harp [facility]   If 7PM-7AM, please contact night-coverage [email or web address] 05/11/2023, 1:01 AM

## 2023-05-10 NOTE — ED Provider Notes (Signed)
Jackie Macias Provider Note    Event Date/Time   First MD Initiated Contact with Patient 05/10/23 2026     (approximate)   History   Foot Pain   HPI  Jackie Macias is a 68 y.o. female with history of peripheral vascular disease, hypertension, COPD, GERD, arthritis, presenting with left foot infection.  Was sent in by Dr. Ether Griffins from podiatry and would need left foot amputation.  Patient states that her foot has been like this for couple weeks, notes some pain.  She denies any new trauma or falls.  States that she is able to feel on both sides.  Had prior amputation to her toes on the right.  She denies any fever, nausea, vomiting, other symptoms.   Independent review of chart, patient was seen by internal medicine doctor recently, has history of ischemic left foot ulcer.  Had a CT angio done in December that showed solitary vessel runoff via reconstituted treating her [name] artery, left-sided dorsalis pedis artery is not identified.  No distal embolization noted.  She had a vascular ultrasound done on 21 January that showed left ABI is within normal limit.  Left toe brachial index is abnormal.  Physical Exam   Triage Vital Signs: ED Triage Vitals [05/10/23 1620]  Encounter Vitals Group     BP (!) 177/76     Systolic BP Percentile      Diastolic BP Percentile      Pulse Rate 84     Resp 18     Temp 97.8 F (36.6 C)     Temp Source Oral     SpO2 98 %     Weight 147 lb (66.7 kg)     Height 5\' 6"  (1.676 m)     Head Circumference      Peak Flow      Pain Score 8     Pain Loc      Pain Education      Exclude from Growth Chart     Most recent vital signs: Vitals:   05/10/23 1620  BP: (!) 177/76  Pulse: 84  Resp: 18  Temp: 97.8 F (36.6 C)  SpO2: 98%     General: Awake, no distress.  CV:  Regular rate Resp:  Normal effort.  Abd:  No distention.  Other:  Left dry gangrene to foot with areas of wet gangrene, able to see bone from her third  toe, there is some surrounding erythema.  Unable to palpate DP pulses.  Used an ultrasound and was able to see some pulsatile flow via color Doppler.  Right great toe was amputated, she does have some dry areas of gangrene to her right heel without erythema and purulent drainage.  She does note equal sensation to her foot bilaterally.  There is no pain out of proportion.   ED Results / Procedures / Treatments   Labs (all labs ordered are listed, but only abnormal results are displayed) Labs Reviewed  COMPREHENSIVE METABOLIC PANEL - Abnormal; Notable for the following components:      Result Value   Glucose, Bld 106 (*)    Total Protein 8.8 (*)    Albumin 3.3 (*)    All other components within normal limits  LACTIC ACID, PLASMA - Abnormal; Notable for the following components:   Lactic Acid, Venous 2.3 (*)    All other components within normal limits  CBC WITH DIFFERENTIAL/PLATELET - Abnormal; Notable for the following components:   RBC 3.73 (*)  Hemoglobin 11.6 (*)    HCT 35.0 (*)    Platelets 408 (*)    All other components within normal limits  SEDIMENTATION RATE - Abnormal; Notable for the following components:   Sed Rate 117 (*)    All other components within normal limits  CULTURE, BLOOD (ROUTINE X 2)  CULTURE, BLOOD (ROUTINE X 2)  LACTIC ACID, PLASMA     RADIOLOGY X-ray on my interpretation without obvious fractures.   PROCEDURES:  Critical Care performed: No  Procedures   MEDICATIONS ORDERED IN ED: Medications  vancomycin (VANCOREADY) IVPB 1500 mg/300 mL (1,500 mg Intravenous New Bag/Given 05/10/23 2155)  piperacillin-tazobactam (ZOSYN) IVPB 3.375 g (has no administration in time range)  vancomycin (VANCOREADY) IVPB 1250 mg/250 mL (has no administration in time range)  oxyCODONE (Oxy IR/ROXICODONE) immediate release tablet 5-10 mg (has no administration in time range)  lactated ringers bolus 1,000 mL (1,000 mLs Intravenous New Bag/Given 05/10/23 2138)   morphine (PF) 2 MG/ML injection 2 mg (2 mg Intravenous Given 05/10/23 2146)     IMPRESSION / MDM / ASSESSMENT AND PLAN / ED COURSE  I reviewed the triage vital signs and the nursing notes.                              Differential diagnosis includes, but is not limited to, osteomyelitis, cellulitis, peripheral vascular disease, also considered early sepsis, electrolyte derangements, dehydration.  Will get labs, blood cultures, ESR, x-ray of the foot.  Will give her some IV fluids, will empirically treat with Vanco and Zosyn, will also give her some IV morphine for pain.  She will need to be admitted for further management.  Patient's presentation is most consistent with acute presentation with potential threat to life or bodily function.  Pending review of labs and imaging, her ESR is elevated, lactate is normal, no leukocytosis, electrolytes not severely deranged, her creatinine is normal, her foot x-ray did show some soft tissue thickening and irregularity suspicious for osteomyelitis, there is also some subcu emphysema of the plantar foot and exposed bone of the fourth distal phalanx.  She is already been ordered for Vanco and Zosyn.  She will need to be admitted for further management.  Consulted hospitalist was agreeable with plan for admission and will evaluate the patient.  She is admitted.  Clinical Course as of 05/10/23 2339  Tue May 10, 2023  2207 DG Foot Complete Left IMPRESSION: 1. Soft tissue thickening and irregularity of the great toe with underlying cortical irregularity of the great toe distal phalangeal tuft, suspicious for osteomyelitis. 2. Soft tissue thickening with subcutaneous emphysema of the plantar foot, likely emphysematous infection. Cortical irregularity and discontinuity along the lateral plantar aspect of the cuboid, suspicious for osteomyelitis. 3. Exposed bony fourth distal phalanx. 4. Soft tissue ulceration of the distal small toe without  definite underlying cortical lucency. 5. Increased cortical thickening of the fourth metatarsal, which may be reactive or reflect osteomyelitis   [TT]  2247 Hospitalist was consulted at 920, was getting a call back, has secretaries paged again at 1020.  Currently waiting on a call back. [TT]  2329 Put out a call to follow-up on hospitalist admission, was told that they are try to figure out who is going admitted admitting her and will call me back. [TT]    Clinical Course User Index [TT] Jodie Echevaria Franchot Erichsen, MD     FINAL CLINICAL IMPRESSION(S) / ED DIAGNOSES   Final diagnoses:  Osteomyelitis of left foot, unspecified type (HCC)  Foot infection  Gangrene (HCC)     Rx / DC Orders   ED Discharge Orders     None        Note:  This document was prepared using Dragon voice recognition software and may include unintentional dictation errors.    Claybon Jabs, MD 05/10/23 [id number]

## 2023-05-10 NOTE — ED Provider Triage Note (Signed)
Emergency Medicine Provider Triage Evaluation Note  Jackie Macias , a 68 y.o. female  was evaluated in triage.  Pt complains of foot gangrene. Patient was sent over by podiatry to have her left foot amputated.  Review of Systems  Positive: Left foot pain Negative:   Physical Exam  BP (!) 177/76 (BP Location: Right Arm)   Pulse 84   Temp 97.8 F (36.6 C) (Oral)   Resp 18   Ht 5\' 6"  (1.676 m)   Wt 66.7 kg   SpO2 98%   BMI 23.73 kg/m  Gen:   Awake, no distress   Resp:  Normal effort  MSK:   Moves extremities without difficulty  Other:  Left foot is bandaged  Medical Decision Making  Medically screening exam initiated at 4:21 PM.  Appropriate orders placed.  Jackie Macias was informed that the remainder of the evaluation will be completed by another provider, this initial triage assessment does not replace that evaluation, and the importance of remaining in the ED until their evaluation is complete.     Cameron Ali, PA-C 05/10/23 1623

## 2023-05-11 ENCOUNTER — Encounter
Admission: EM | Disposition: A | Discharge status: Skilled Nursing Facility | Payer: Self-pay | Source: Home / Self Care | Attending: Internal Medicine

## 2023-05-11 DIAGNOSIS — I96 Gangrene, not elsewhere classified: Secondary | ICD-10-CM

## 2023-05-11 DIAGNOSIS — I708 Atherosclerosis of other arteries: Secondary | ICD-10-CM | POA: Diagnosis not present

## 2023-05-11 DIAGNOSIS — I70262 Atherosclerosis of native arteries of extremities with gangrene, left leg: Principal | ICD-10-CM

## 2023-05-11 DIAGNOSIS — I70201 Unspecified atherosclerosis of native arteries of extremities, right leg: Secondary | ICD-10-CM | POA: Diagnosis not present

## 2023-05-11 DIAGNOSIS — Z95828 Presence of other vascular implants and grafts: Secondary | ICD-10-CM

## 2023-05-11 DIAGNOSIS — L089 Local infection of the skin and subcutaneous tissue, unspecified: Secondary | ICD-10-CM

## 2023-05-11 DIAGNOSIS — Z9889 Other specified postprocedural states: Secondary | ICD-10-CM | POA: Diagnosis not present

## 2023-05-11 DIAGNOSIS — M869 Osteomyelitis, unspecified: Secondary | ICD-10-CM | POA: Diagnosis not present

## 2023-05-11 HISTORY — PX: LOWER EXTREMITY ANGIOGRAPHY: CATH118251

## 2023-05-11 LAB — CBC
HCT: 29.1 % — ABNORMAL LOW (ref 36.0–46.0)
Hemoglobin: 9.5 g/dL — ABNORMAL LOW (ref 12.0–15.0)
MCH: 30.9 pg (ref 26.0–34.0)
MCHC: 32.6 g/dL (ref 30.0–36.0)
MCV: 94.8 fL (ref 80.0–100.0)
Platelets: 314 10*3/uL (ref 150–400)
RBC: 3.07 MIL/uL — ABNORMAL LOW (ref 3.87–5.11)
RDW: 14.8 % (ref 11.5–15.5)
WBC: 6.7 10*3/uL (ref 4.0–10.5)
nRBC: 0 % (ref 0.0–0.2)

## 2023-05-11 LAB — PROTIME-INR
INR: 1.2 (ref 0.8–1.2)
Prothrombin Time: 15.3 s — ABNORMAL HIGH (ref 11.4–15.2)

## 2023-05-11 LAB — TYPE AND SCREEN
ABO/RH(D): A POS
Antibody Screen: NEGATIVE

## 2023-05-11 LAB — GLUCOSE, CAPILLARY: Glucose-Capillary: 85 mg/dL (ref 70–99)

## 2023-05-11 LAB — BASIC METABOLIC PANEL
Anion gap: 8 (ref 5–15)
BUN: 15 mg/dL (ref 8–23)
CO2: 24 mmol/L (ref 22–32)
Calcium: 8.4 mg/dL — ABNORMAL LOW (ref 8.9–10.3)
Chloride: 102 mmol/L (ref 98–111)
Creatinine, Ser: 0.67 mg/dL (ref 0.44–1.00)
GFR, Estimated: >60 mL/min (ref >60)
Glucose, Bld: 130 mg/dL — ABNORMAL HIGH (ref 70–99)
Potassium: 4.1 mmol/L (ref 3.5–5.1)
Sodium: 134 mmol/L — ABNORMAL LOW (ref 135–145)

## 2023-05-11 LAB — HIV ANTIBODY (ROUTINE TESTING W REFLEX): HIV Screen 4th Generation wRfx: NONREACTIVE

## 2023-05-11 LAB — APTT: aPTT: 33 s (ref 24–36)

## 2023-05-11 LAB — C-REACTIVE PROTEIN: CRP: 13.4 mg/dL — ABNORMAL HIGH (ref <1.0)

## 2023-05-11 SURGERY — LOWER EXTREMITY ANGIOGRAPHY
Anesthesia: Moderate Sedation | Laterality: Left

## 2023-05-11 MED ORDER — METHYLPREDNISOLONE SODIUM SUCC 125 MG IJ SOLR: 125.0000 mg | Freq: Once | INTRAMUSCULAR | Status: DC | PRN

## 2023-05-11 MED ORDER — MIDAZOLAM HCL 2 MG/2ML IJ SOLN
INTRAMUSCULAR | Status: DC | PRN
  Administered 2023-05-11: 1 mg via INTRAVENOUS

## 2023-05-11 MED ORDER — LIDOCAINE HCL (PF) 1 % IJ SOLN
INTRAMUSCULAR | Status: DC | PRN
  Administered 2023-05-11: 10 mL via INTRADERMAL

## 2023-05-11 MED ORDER — POLYETHYLENE GLYCOL 3350 17 G PO PACK
17.0000 g | PACK | Freq: Every day | ORAL | Status: DC | PRN
  Administered 2023-05-19: 17 g via ORAL
  Filled 2023-05-11: qty 1

## 2023-05-11 MED ORDER — DULOXETINE HCL 30 MG PO CPEP
30.0000 mg | ORAL_CAPSULE | Freq: Every day | ORAL | Status: DC
  Administered 2023-05-11 – 2023-05-19 (×9): 30 mg via ORAL
  Filled 2023-05-11 (×9): qty 1

## 2023-05-11 MED ORDER — HEPARIN (PORCINE) IN NACL 1000-0.9 UT/500ML-% IV SOLN
INTRAVENOUS | Status: DC | PRN
  Administered 2023-05-11: 500 mL

## 2023-05-11 MED ORDER — CEFAZOLIN SODIUM-DEXTROSE 2-4 GM/100ML-% IV SOLN: 2.0000 g | INTRAVENOUS | Status: DC

## 2023-05-11 MED ORDER — PANTOPRAZOLE SODIUM 40 MG PO TBEC
40.0000 mg | DELAYED_RELEASE_TABLET | Freq: Every morning | ORAL | Status: DC
Start: 2023-05-11 — End: 2023-05-19
  Administered 2023-05-11 – 2023-05-19 (×9): 40 mg via ORAL
  Filled 2023-05-11 (×9): qty 1

## 2023-05-11 MED ORDER — SODIUM CHLORIDE 0.9 % IV SOLN: INTRAVENOUS | Status: DC

## 2023-05-11 MED ORDER — ASPIRIN 81 MG PO TBEC
81.0000 mg | DELAYED_RELEASE_TABLET | Freq: Every day | ORAL | Status: DC
  Administered 2023-05-12 – 2023-05-19 (×8): 81 mg via ORAL
  Filled 2023-05-11 (×8): qty 1

## 2023-05-11 MED ORDER — FENTANYL CITRATE PF 50 MCG/ML IJ SOSY: 12.5000 ug | PREFILLED_SYRINGE | Freq: Once | INTRAMUSCULAR | Status: DC | PRN

## 2023-05-11 MED ORDER — GABAPENTIN 300 MG PO CAPS
600.0000 mg | ORAL_CAPSULE | Freq: Every day | ORAL | Status: DC
  Administered 2023-05-11 – 2023-05-19 (×9): 600 mg via ORAL
  Filled 2023-05-11 (×9): qty 2

## 2023-05-11 MED ORDER — FAMOTIDINE 20 MG PO TABS: 40.0000 mg | ORAL_TABLET | Freq: Once | ORAL | Status: DC | PRN

## 2023-05-11 MED ORDER — MORPHINE SULFATE (PF) 2 MG/ML IV SOLN
2.0000 mg | INTRAVENOUS | Status: DC | PRN
  Administered 2023-05-11 – 2023-05-13 (×7): 2 mg via INTRAVENOUS
  Filled 2023-05-11 (×7): qty 1

## 2023-05-11 MED ORDER — SODIUM CHLORIDE 0.9 % IV BOLUS
1500.0000 mL | Freq: Once | INTRAVENOUS | Status: AC
  Administered 2023-05-11: 1500 mL via INTRAVENOUS

## 2023-05-11 MED ORDER — MIDAZOLAM HCL 5 MG/5ML IJ SOLN
INTRAMUSCULAR | Status: AC
  Filled 2023-05-11: qty 5

## 2023-05-11 MED ORDER — ROSUVASTATIN CALCIUM 10 MG PO TABS
20.0000 mg | ORAL_TABLET | Freq: Every day | ORAL | Status: DC
  Administered 2023-05-11 – 2023-05-19 (×9): 20 mg via ORAL
  Filled 2023-05-11 (×8): qty 2
  Filled 2023-05-11: qty 1
  Filled 2023-05-11: qty 2

## 2023-05-11 MED ORDER — HEPARIN SODIUM (PORCINE) 1000 UNIT/ML IJ SOLN
INTRAMUSCULAR | Status: AC
  Filled 2023-05-11: qty 10

## 2023-05-11 MED ORDER — FERROUS SULFATE 325 (65 FE) MG PO TABS
325.0000 mg | ORAL_TABLET | Freq: Every day | ORAL | Status: DC
  Administered 2023-05-11 – 2023-05-19 (×9): 325 mg via ORAL
  Filled 2023-05-11 (×9): qty 1

## 2023-05-11 MED ORDER — IODIXANOL 320 MG/ML IV SOLN
INTRAVENOUS | Status: DC | PRN
  Administered 2023-05-11: 40 mL via INTRA_ARTERIAL

## 2023-05-11 MED ORDER — FENTANYL CITRATE (PF) 100 MCG/2ML IJ SOLN
INTRAMUSCULAR | Status: AC
  Filled 2023-05-11: qty 2

## 2023-05-11 MED ORDER — FENTANYL CITRATE (PF) 100 MCG/2ML IJ SOLN
INTRAMUSCULAR | Status: DC | PRN
  Administered 2023-05-11: 50 ug via INTRAVENOUS

## 2023-05-11 MED ORDER — MIDAZOLAM HCL 2 MG/ML PO SYRP: 8.0000 mg | ORAL_SOLUTION | Freq: Once | ORAL | Status: DC | PRN

## 2023-05-11 MED ORDER — DIPHENHYDRAMINE HCL 50 MG/ML IJ SOLN: 50.0000 mg | Freq: Once | INTRAMUSCULAR | Status: DC | PRN

## 2023-05-11 MED ORDER — AMLODIPINE BESYLATE 5 MG PO TABS
5.0000 mg | ORAL_TABLET | Freq: Every day | ORAL | Status: DC
  Administered 2023-05-11 – 2023-05-19 (×9): 5 mg via ORAL
  Filled 2023-05-11 (×9): qty 1

## 2023-05-11 SURGICAL SUPPLY — 14 items
CATH OMNI FLUSH 5F 65CM (CATHETERS) IMPLANT
COVER PROBE ULTRASOUND 5X96 (MISCELLANEOUS) IMPLANT
DEVICE STARCLOSE SE CLOSURE (Vascular Products) IMPLANT
DEVICE TORQUE .025-.038 (MISCELLANEOUS) IMPLANT
GLIDEWIRE ADV .035X260CM (WIRE) IMPLANT
GOWN STRL REUS W/ TWL LRG LVL3 (GOWN DISPOSABLE) ×1 IMPLANT
NDL ENTRY 21GA 7CM ECHOTIP (NEEDLE) IMPLANT
NEEDLE ENTRY 21GA 7CM ECHOTIP (NEEDLE) ×1 IMPLANT
PACK ANGIOGRAPHY (CUSTOM PROCEDURE TRAY) ×1 IMPLANT
SET INTRO CAPELLA COAXIAL (SET/KITS/TRAYS/PACK) IMPLANT
SHEATH BRITE TIP 5FRX11 (SHEATH) IMPLANT
SYR MEDRAD MARK 7 150ML (SYRINGE) IMPLANT
TUBING CONTRAST HIGH PRESS 72 (TUBING) IMPLANT
WIRE GUIDERIGHT .035X150 (WIRE) IMPLANT

## 2023-05-11 NOTE — Consult Note (Signed)
Hospital Consult    Reason for Consult:  Left Lower extremity Gangrene  Requesting Physician:  Dr Gwyneth Revels MD  MRN #:  [id number]  History of Present Illness: This is a 68 y.o. female with medical history significant of chronic left foot wound,  PVD, HTN, HLD, COPD, depression, gout, anemia, tobacco abuse, who presents with left foot pain and wound.  Patient noted to have a chronic left foot wound which has progressively gotten worse over the past 3 weeks.  Due to the worsening pain in her left foot she was seen by Dr. Ether Griffins of podiatry today who found that her entire foot was gangrene and she was sent to [facility] emergency department for further evaluation by vascular surgery.  Dr. Irene Limbo recommendations at the time of her left foot amputation.  Patient was last seen by vascular surgery on 03/08/2023 in which she had left lower extremity angiogram with angioplasty and stents placed to the left popliteal artery.  She has also had prior amputation of the little the right great toe by Dr. Gwyneth Revels.  Patient today endorses that she has been taking her 81 mg of aspirin and 75 mg of Plavix daily.  Past Medical History:  Diagnosis Date   Arthritis    Bronchitis    COPD (chronic obstructive pulmonary disease) (HCC)    GERD (gastroesophageal reflux disease)    Gout    Hypertension     Past Surgical History:  Procedure Laterality Date   AMPUTATION Right 07/29/2017   Procedure: AMPUTATION DIGIT/ 40981;  Surgeon: Linus Galas, DPM;  Location: [facility] ORS;  Service: Podiatry;  Laterality: Right;   AMPUTATION TOE Right 02/18/2021   Procedure: AMPUTATION TOE-GREAT TOE;  Surgeon: Gwyneth Revels, DPM;  Location: [facility] OR[facility];  Service: Podiatry;  Laterality: Right;   CHOLECYSTECTOMY     facial biopsy Right    JOINT REPLACEMENT Right 12/2016   THR   LOWER EXTREMITY ANGIOGRAPHY Right 09/27/2017   Procedure: LOWER EXTREMITY ANGIOGRAPHY;  Surgeon: Renford Dills, MD;  Location: [facility];   Service: Cardiovascular;  Laterality: Right;   LOWER EXTREMITY ANGIOGRAPHY Right 06/17/2020   Procedure: LOWER EXTREMITY ANGIOGRAPHY;  Surgeon: Renford Dills, MD;  Location: [facility];  Service: Cardiovascular;  Laterality: Right;   LOWER EXTREMITY ANGIOGRAPHY Right 02/18/2021   Procedure: LOWER EXTREMITY ANGIOGRAPHY;  Surgeon: Renford Dills, MD;  Location: [facility];  Service: Cardiovascular;  Laterality: Right;   LOWER EXTREMITY ANGIOGRAPHY Left 09/07/2022   Procedure: Lower Extremity Angiography;  Surgeon: Renford Dills, MD;  Location: [facility];  Service: Cardiovascular;  Laterality: Left;   LOWER EXTREMITY ANGIOGRAPHY Left 03/07/2023   Procedure: Lower Extremity Angiography;  Surgeon: Annice Needy, MD;  Location: [facility];  Service: Cardiovascular;  Laterality: Left;   LOWER EXTREMITY ANGIOGRAPHY Left 03/08/2023   Procedure: Lower Extremity Angiography;  Surgeon: Renford Dills, MD;  Location: [facility];  Service: Cardiovascular;  Laterality: Left;   TONSILLECTOMY     TOTAL HIP ARTHROPLASTY Right 01/17/2017   Procedure: TOTAL HIP ARTHROPLASTY ANTERIOR APPROACH;  Surgeon: Lyndle Herrlich, MD;  Location: [facility] OR[facility];  Service: Orthopedics;  Laterality: Right;    Allergies  Allergen Reactions   Lisinopril-Hydrochlorothiazide Other (See Comments)    Prior to Admission medications   Medication Sig Start Date End Date Taking? Authorizing Provider  acetaminophen (TYLENOL) 325 MG tablet Take 2 tablets (650 mg total) by mouth every 6 (six) hours as needed for  mild pain (pain score 1-3) (or Fever >/= 101). 03/14/23  Yes Alford Highland, MD  albuterol (VENTOLIN HFA) 108 (90 Base) MCG/ACT inhaler Inhale 2 puffs into the lungs every 6 (six) hours as needed for wheezing or shortness of breath. 11/22/22  Yes Miki Kins, FNP  aspirin EC 81 MG tablet Take 1 tablet (81 mg total) by mouth daily. Swallow whole. 03/15/23  Yes  Wieting, Richard, MD  clopidogrel (PLAVIX) 75 MG tablet Take 1 tablet (75 mg total) by mouth daily. 03/14/23  Yes Alford Highland, MD  ferrous sulfate 325 (65 FE) MG tablet Take 1 tablet (325 mg total) by mouth daily with breakfast. 03/15/23  Yes Wieting, Richard, MD  furosemide (LASIX) 20 MG tablet Take 1 tablet (20 mg total) by mouth daily as needed for edema. 03/14/23  Yes Wieting, Richard, MD  gabapentin (NEURONTIN) 600 MG tablet Take 1 tablet (600 mg total) by mouth daily. 04/29/23 04/28/24 Yes Margaretann Loveless, MD  ipratropium-albuterol (DUONEB) 0.5-2.5 (3) MG/3ML SOLN Take 3 mLs by nebulization every 6 (six) hours as needed. 11/22/22  Yes Miki Kins, FNP  oxyCODONE (OXY IR/ROXICODONE) 5 MG immediate release tablet Take 1-2 tablets (5-10 mg total) by mouth every 6 (six) hours as needed for severe pain (pain score 7-10). 04/20/23  Yes Miki Kins, FNP  pantoprazole (PROTONIX) 40 MG tablet Take 1 tablet (40 mg total) by mouth every morning. 11/22/22  Yes Miki Kins, FNP  rosuvastatin (CRESTOR) 20 MG tablet Take 1 tablet (20 mg total) by mouth daily. 11/22/22  Yes Miki Kins, FNP  sodium chloride 1 g tablet Take 1 tablet (1 g total) by mouth 2 (two) times daily with a meal. 03/14/23  Yes Wieting, Richard, MD  Vitamin D, Ergocalciferol, (DRISDOL) 1.25 MG (50000 UNIT) CAPS capsule Take 1 capsule (50,000 Units total) by mouth every 7 (seven) days. 11/23/22  Yes Miki Kins, FNP  doxycycline (VIBRA-TABS) 100 MG tablet Take 1 tablet (100 mg total) by mouth every 12 (twelve) hours. Patient not taking: Reported on 05/11/2023 03/14/23   Alford Highland, MD  DULoxetine (CYMBALTA) 30 MG capsule Take 1 capsule (30 mg total) by mouth daily. 04/29/23 04/28/24  Margaretann Loveless, MD  polyethylene glycol (MIRALAX / GLYCOLAX) 17 g packet Take 17 g by mouth daily. Patient not taking: Reported on 05/11/2023 03/14/23   Alford Highland, MD    Social History   Socioeconomic History   Marital  status: Single    Spouse name: Not on file   Number of children: Not on file   Years of education: Not on file   Highest education level: Not on file  Occupational History   Not on file  Tobacco Use   Smoking status: Every Day    Current packs/day: 1.00    Average packs/day: 1 pack/day for 50.0 years (50.0 ttl pk-yrs)    Types: Cigarettes   Smokeless tobacco: Never  Vaping Use   Vaping status: Never Used  Substance and Sexual Activity   Alcohol use: Yes    Alcohol/week: 3.0 standard drinks of alcohol    Types: 3 Cans of beer per week    Comment: occasional   Drug use: No   Sexual activity: Not on file  Other Topics Concern   Not on file  Social History Narrative   Not on file   Social Drivers of Health   Financial Resource Strain: Not on file  Food Insecurity: No Food Insecurity (03/07/2023)   Hunger Vital Sign  Worried About Programme researcher, broadcasting/film/video in the Last Year: Never true    Ran Out of Food in the Last Year: Never true  Transportation Needs: No Transportation Needs (03/07/2023)   PRAPARE - Administrator, Civil Service (Medical): No    Lack of Transportation (Non-Medical): No  Physical Activity: Not on file  Stress: Not on file  Social Connections: Not on file  Intimate Partner Violence: Not At Risk (03/07/2023)   Humiliation, Afraid, Rape, and Kick questionnaire    Fear of Current or Ex-Partner: No    Emotionally Abused: No    Physically Abused: No    Sexually Abused: No     Family History  Problem Relation Age of Onset   Breast cancer Sister 65   Breast cancer Other     ROS: Otherwise negative unless mentioned in HPI  Physical Examination  Vitals:   05/11/23 0530 05/11/23 0630  BP: (!) 135/57 (!) 137/54  Pulse: 91 86  Resp: 14 13  Temp:    SpO2: 93% 93%   Body mass index is 23.73 kg/m.  General:  WDWN in NAD Gait: Not observed HENT: WNL, normocephalic Pulmonary: normal non-labored breathing, without Rales, rhonchi,   wheezing Cardiac: regular, without  Murmurs, rubs or gallops; without carotid bruits Abdomen: Positive bowel sounds throughout, soft, NT/ND, no masses Skin: without rashes Vascular Exam/Pulses: Unable to palpate pulses in the left popliteal DP/PT.  Lower extremity from mid calf to foot is cool to touch.  Gangrene obviously noted to left foot from toes to heal.  Right lower extremity warm to touch.  Palpable DP pulse only. Extremities: with ischemic changes, with Gangrene , with cellulitis; with open wounds;  Musculoskeletal: no muscle wasting or atrophy  Neurologic: A&O X 3;  No focal weakness or paresthesias are detected; speech is fluent/normal Psychiatric:  The pt has Normal affect. Lymph:  Unremarkable  CBC    Component Value Date/Time   WBC 6.7 05/11/2023 0440   RBC 3.07 (L) 05/11/2023 0440   HGB 9.5 (L) 05/11/2023 0440   HCT 29.1 (L) 05/11/2023 0440   PLT 314 05/11/2023 0440   MCV 94.8 05/11/2023 0440   MCH 30.9 05/11/2023 0440   MCHC 32.6 05/11/2023 0440   RDW 14.8 05/11/2023 0440   LYMPHSABS 1.5 05/10/2023 1624   MONOABS 0.8 05/10/2023 1624   EOSABS 0.0 05/10/2023 1624   BASOSABS 0.0 05/10/2023 1624    BMET    Component Value Date/Time   NA 134 (L) 05/11/2023 0440   NA 138 11/22/2022 1042   K 4.1 05/11/2023 0440   CL 102 05/11/2023 0440   CO2 24 05/11/2023 0440   GLUCOSE 130 (H) 05/11/2023 0440   BUN 15 05/11/2023 0440   BUN 18 11/22/2022 1042   CREATININE 0.67 05/11/2023 0440   CALCIUM 8.4 (L) 05/11/2023 0440   GFRNONAA >60 05/11/2023 0440   GFRAA >60 09/15/2018 1742    COAGS: Lab Results  Component Value Date   INR 1.2 05/11/2023   INR 1.04 01/05/2017     Non-Invasive Vascular Imaging:   EXAM: LEFT FOOT - COMPLETE 3 VIEW   COMPARISON:  Left foot radiograph dated 03/06/2023   FINDINGS: Soft tissue thickening and irregularity of the great toe with underlying cortical irregularity of the great toe distal phalangeal tuft. The fourth toe distal  phalanx appears to extend beyond the margin of the soft tissue. There is soft tissue ulceration of the distal small toe without definite underlying cortical lucency. Increased cortical thickening  of the fourth metatarsal. Soft tissue thickening and lucencies are also seen at the plantar aspect of the foot. There is cortical irregularity and discontinuity along the lateral plantar aspect of the cuboid.   IMPRESSION: 1. Soft tissue thickening and irregularity of the great toe with underlying cortical irregularity of the great toe distal phalangeal tuft, suspicious for osteomyelitis. 2. Soft tissue thickening with subcutaneous emphysema of the plantar foot, likely emphysematous infection. Cortical irregularity and discontinuity along the lateral plantar aspect of the cuboid, suspicious for osteomyelitis. 3. Exposed bony fourth distal phalanx. 4. Soft tissue ulceration of the distal small toe without definite underlying cortical lucency. 5. Increased cortical thickening of the fourth metatarsal, which may be reactive or reflect osteomyelitis.  Statin:  Yes.   Beta Blocker:  No. Aspirin:  Yes.   ACEI:  No. ARB:  No. CCB use:  No Other antiplatelets/anticoagulants:  Yes.   Plavix 75 mg Daily   ASSESSMENT/PLAN: This is a 68 y.o. female who presents to [facility] emergency department after seeing Dr. Gwyneth Revels in clinic yesterday for a left lower extremity pain.  Patient was noted to have severe gangrene to her left foot while her mid calf to her foot were cool to touch.  Dr. Kristeen Miss recommendation was below the knee amputation.  PLAN: Vascular surgery plans on taking the patient to the vascular lab today on 05/11/2023 for a left lower extremity angiogram with possible intervention.  Long detailed discussion with the patient today concerning the need for amputation and that the angiogram today would be more diagnostic to determine whether or not she would need a below the knee amputation  versus above-the-knee amputation.  We discussed in detail the procedure, benefits, risk, and complications.  She verbalizes her understanding and endorses she has had this procedure before.  I answered all her questions today.  She would like to proceed as soon as possible today.  Patient endorses she has been n.p.o. since midnight last night. - I discussed the plan in detail with Dr. Levora Dredge MD and he agrees with the plan.   [name] [facility] 05/11/2023 7:24 AM

## 2023-05-11 NOTE — H&P (View-Only) (Signed)
Hospital Consult    Reason for Consult:  Left Lower extremity Gangrene  Requesting Physician:  Dr Gwyneth Revels MD  MRN #:  [id number]  History of Present Illness: This is a 68 y.o. female with medical history significant of chronic left foot wound,  PVD, HTN, HLD, COPD, depression, gout, anemia, tobacco abuse, who presents with left foot pain and wound.  Patient noted to have a chronic left foot wound which has progressively gotten worse over the past 3 weeks.  Due to the worsening pain in her left foot she was seen by Dr. Ether Griffins of podiatry today who found that her entire foot was gangrene and she was sent to [facility] emergency department for further evaluation by vascular surgery.  Dr. Irene Limbo recommendations at the time of her left foot amputation.  Patient was last seen by vascular surgery on 03/08/2023 in which she had left lower extremity angiogram with angioplasty and stents placed to the left popliteal artery.  She has also had prior amputation of the little the right great toe by Dr. Gwyneth Revels.  Patient today endorses that she has been taking her 81 mg of aspirin and 75 mg of Plavix daily.  Past Medical History:  Diagnosis Date   Arthritis    Bronchitis    COPD (chronic obstructive pulmonary disease) (HCC)    GERD (gastroesophageal reflux disease)    Gout    Hypertension     Past Surgical History:  Procedure Laterality Date   AMPUTATION Right 07/29/2017   Procedure: AMPUTATION DIGIT/ 40981;  Surgeon: Linus Galas, DPM;  Location: [facility] OR[facility];  Service: Podiatry;  Laterality: Right;   AMPUTATION TOE Right 02/18/2021   Procedure: AMPUTATION TOE-GREAT TOE;  Surgeon: Gwyneth Revels, DPM;  Location: [facility];  Service: Podiatry;  Laterality: Right;   CHOLECYSTECTOMY     facial biopsy Right    JOINT REPLACEMENT Right 12/2016   THR   LOWER EXTREMITY ANGIOGRAPHY Right 09/27/2017   Procedure: LOWER EXTREMITY ANGIOGRAPHY;  Surgeon: Renford Dills, MD;  Location: [facility];   Service: Cardiovascular;  Laterality: Right;   LOWER EXTREMITY ANGIOGRAPHY Right 06/17/2020   Procedure: LOWER EXTREMITY ANGIOGRAPHY;  Surgeon: Renford Dills, MD;  Location: [facility];  Service: Cardiovascular;  Laterality: Right;   LOWER EXTREMITY ANGIOGRAPHY Right 02/18/2021   Procedure: LOWER EXTREMITY ANGIOGRAPHY;  Surgeon: Renford Dills, MD;  Location: [facility];  Service: Cardiovascular;  Laterality: Right;   LOWER EXTREMITY ANGIOGRAPHY Left 09/07/2022   Procedure: Lower Extremity Angiography;  Surgeon: Renford Dills, MD;  Location: [facility];  Service: Cardiovascular;  Laterality: Left;   LOWER EXTREMITY ANGIOGRAPHY Left 03/07/2023   Procedure: Lower Extremity Angiography;  Surgeon: Annice Needy, MD;  Location: [facility];  Service: Cardiovascular;  Laterality: Left;   LOWER EXTREMITY ANGIOGRAPHY Left 03/08/2023   Procedure: Lower Extremity Angiography;  Surgeon: Renford Dills, MD;  Location: [facility];  Service: Cardiovascular;  Laterality: Left;   TONSILLECTOMY     TOTAL HIP ARTHROPLASTY Right 01/17/2017   Procedure: TOTAL HIP ARTHROPLASTY ANTERIOR APPROACH;  Surgeon: Lyndle Herrlich, MD;  Location: [facility];  Service: Orthopedics;  Laterality: Right;    Allergies  Allergen Reactions   Lisinopril-Hydrochlorothiazide Other (See Comments)    Prior to Admission medications   Medication Sig Start Date End Date Taking? Authorizing Provider  acetaminophen (TYLENOL) 325 MG tablet Take 2 tablets (650 mg total) by mouth every 6 (six) hours as needed for  mild pain (pain score 1-3) (or Fever >/= 101). 03/14/23  Yes Alford Highland, MD  albuterol (VENTOLIN HFA) 108 (90 Base) MCG/ACT inhaler Inhale 2 puffs into the lungs every 6 (six) hours as needed for wheezing or shortness of breath. 11/22/22  Yes Miki Kins, FNP  aspirin EC 81 MG tablet Take 1 tablet (81 mg total) by mouth daily. Swallow whole. 03/15/23  Yes  Wieting, Richard, MD  clopidogrel (PLAVIX) 75 MG tablet Take 1 tablet (75 mg total) by mouth daily. 03/14/23  Yes Alford Highland, MD  ferrous sulfate 325 (65 FE) MG tablet Take 1 tablet (325 mg total) by mouth daily with breakfast. 03/15/23  Yes Wieting, Richard, MD  furosemide (LASIX) 20 MG tablet Take 1 tablet (20 mg total) by mouth daily as needed for edema. 03/14/23  Yes Wieting, Richard, MD  gabapentin (NEURONTIN) 600 MG tablet Take 1 tablet (600 mg total) by mouth daily. 04/29/23 04/28/24 Yes Margaretann Loveless, MD  ipratropium-albuterol (DUONEB) 0.5-2.5 (3) MG/3ML SOLN Take 3 mLs by nebulization every 6 (six) hours as needed. 11/22/22  Yes Miki Kins, FNP  oxyCODONE (OXY IR/ROXICODONE) 5 MG immediate release tablet Take 1-2 tablets (5-10 mg total) by mouth every 6 (six) hours as needed for severe pain (pain score 7-10). 04/20/23  Yes Miki Kins, FNP  pantoprazole (PROTONIX) 40 MG tablet Take 1 tablet (40 mg total) by mouth every morning. 11/22/22  Yes Miki Kins, FNP  rosuvastatin (CRESTOR) 20 MG tablet Take 1 tablet (20 mg total) by mouth daily. 11/22/22  Yes Miki Kins, FNP  sodium chloride 1 g tablet Take 1 tablet (1 g total) by mouth 2 (two) times daily with a meal. 03/14/23  Yes Wieting, Richard, MD  Vitamin D, Ergocalciferol, (DRISDOL) 1.25 MG (50000 UNIT) CAPS capsule Take 1 capsule (50,000 Units total) by mouth every 7 (seven) days. 11/23/22  Yes Miki Kins, FNP  doxycycline (VIBRA-TABS) 100 MG tablet Take 1 tablet (100 mg total) by mouth every 12 (twelve) hours. Patient not taking: Reported on 05/11/2023 03/14/23   Alford Highland, MD  DULoxetine (CYMBALTA) 30 MG capsule Take 1 capsule (30 mg total) by mouth daily. 04/29/23 04/28/24  Margaretann Loveless, MD  polyethylene glycol (MIRALAX / GLYCOLAX) 17 g packet Take 17 g by mouth daily. Patient not taking: Reported on 05/11/2023 03/14/23   Alford Highland, MD    Social History   Socioeconomic History   Marital  status: Single    Spouse name: Not on file   Number of children: Not on file   Years of education: Not on file   Highest education level: Not on file  Occupational History   Not on file  Tobacco Use   Smoking status: Every Day    Current packs/day: 1.00    Average packs/day: 1 pack/day for 50.0 years (50.0 ttl pk-yrs)    Types: Cigarettes   Smokeless tobacco: Never  Vaping Use   Vaping status: Never Used  Substance and Sexual Activity   Alcohol use: Yes    Alcohol/week: 3.0 standard drinks of alcohol    Types: 3 Cans of beer per week    Comment: occasional   Drug use: No   Sexual activity: Not on file  Other Topics Concern   Not on file  Social History Narrative   Not on file   Social Drivers of Health   Financial Resource Strain: Not on file  Food Insecurity: No Food Insecurity (03/07/2023)   Hunger Vital Sign  Worried About Programme researcher, broadcasting/film/video in the Last Year: Never true    Ran Out of Food in the Last Year: Never true  Transportation Needs: No Transportation Needs (03/07/2023)   PRAPARE - Administrator, Civil Service (Medical): No    Lack of Transportation (Non-Medical): No  Physical Activity: Not on file  Stress: Not on file  Social Connections: Not on file  Intimate Partner Violence: Not At Risk (03/07/2023)   Humiliation, Afraid, Rape, and Kick questionnaire    Fear of Current or Ex-Partner: No    Emotionally Abused: No    Physically Abused: No    Sexually Abused: No     Family History  Problem Relation Age of Onset   Breast cancer Sister 65   Breast cancer Other     ROS: Otherwise negative unless mentioned in HPI  Physical Examination  Vitals:   05/11/23 0530 05/11/23 0630  BP: (!) 135/57 (!) 137/54  Pulse: 91 86  Resp: 14 13  Temp:    SpO2: 93% 93%   Body mass index is 23.73 kg/m.  General:  WDWN in NAD Gait: Not observed HENT: WNL, normocephalic Pulmonary: normal non-labored breathing, without Rales, rhonchi,   wheezing Cardiac: regular, without  Murmurs, rubs or gallops; without carotid bruits Abdomen: Positive bowel sounds throughout, soft, NT/ND, no masses Skin: without rashes Vascular Exam/Pulses: Unable to palpate pulses in the left popliteal DP/PT.  Lower extremity from mid calf to foot is cool to touch.  Gangrene obviously noted to left foot from toes to heal.  Right lower extremity warm to touch.  Palpable DP pulse only. Extremities: with ischemic changes, with Gangrene , with cellulitis; with open wounds;  Musculoskeletal: no muscle wasting or atrophy  Neurologic: A&O X 3;  No focal weakness or paresthesias are detected; speech is fluent/normal Psychiatric:  The pt has Normal affect. Lymph:  Unremarkable  CBC    Component Value Date/Time   WBC 6.7 05/11/2023 0440   RBC 3.07 (L) 05/11/2023 0440   HGB 9.5 (L) 05/11/2023 0440   HCT 29.1 (L) 05/11/2023 0440   PLT 314 05/11/2023 0440   MCV 94.8 05/11/2023 0440   MCH 30.9 05/11/2023 0440   MCHC 32.6 05/11/2023 0440   RDW 14.8 05/11/2023 0440   LYMPHSABS 1.5 05/10/2023 1624   MONOABS 0.8 05/10/2023 1624   EOSABS 0.0 05/10/2023 1624   BASOSABS 0.0 05/10/2023 1624    BMET    Component Value Date/Time   NA 134 (L) 05/11/2023 0440   NA 138 11/22/2022 1042   K 4.1 05/11/2023 0440   CL 102 05/11/2023 0440   CO2 24 05/11/2023 0440   GLUCOSE 130 (H) 05/11/2023 0440   BUN 15 05/11/2023 0440   BUN 18 11/22/2022 1042   CREATININE 0.67 05/11/2023 0440   CALCIUM 8.4 (L) 05/11/2023 0440   GFRNONAA >60 05/11/2023 0440   GFRAA >60 09/15/2018 1742    COAGS: Lab Results  Component Value Date   INR 1.2 05/11/2023   INR 1.04 01/05/2017     Non-Invasive Vascular Imaging:   EXAM: LEFT FOOT - COMPLETE 3 VIEW   COMPARISON:  Left foot radiograph dated 03/06/2023   FINDINGS: Soft tissue thickening and irregularity of the great toe with underlying cortical irregularity of the great toe distal phalangeal tuft. The fourth toe distal  phalanx appears to extend beyond the margin of the soft tissue. There is soft tissue ulceration of the distal small toe without definite underlying cortical lucency. Increased cortical thickening  of the fourth metatarsal. Soft tissue thickening and lucencies are also seen at the plantar aspect of the foot. There is cortical irregularity and discontinuity along the lateral plantar aspect of the cuboid.   IMPRESSION: 1. Soft tissue thickening and irregularity of the great toe with underlying cortical irregularity of the great toe distal phalangeal tuft, suspicious for osteomyelitis. 2. Soft tissue thickening with subcutaneous emphysema of the plantar foot, likely emphysematous infection. Cortical irregularity and discontinuity along the lateral plantar aspect of the cuboid, suspicious for osteomyelitis. 3. Exposed bony fourth distal phalanx. 4. Soft tissue ulceration of the distal small toe without definite underlying cortical lucency. 5. Increased cortical thickening of the fourth metatarsal, which may be reactive or reflect osteomyelitis.  Statin:  Yes.   Beta Blocker:  No. Aspirin:  Yes.   ACEI:  No. ARB:  No. CCB use:  No Other antiplatelets/anticoagulants:  Yes.   Plavix 75 mg Daily   ASSESSMENT/PLAN: This is a 68 y.o. female who presents to [facility] emergency department after seeing Dr. Gwyneth Revels in clinic yesterday for a left lower extremity pain.  Patient was noted to have severe gangrene to her left foot while her mid calf to her foot were cool to touch.  Dr. Kristeen Miss recommendation was below the knee amputation.  PLAN: Vascular surgery plans on taking the patient to the vascular lab today on 05/11/2023 for a left lower extremity angiogram with possible intervention.  Long detailed discussion with the patient today concerning the need for amputation and that the angiogram today would be more diagnostic to determine whether or not she would need a below the knee amputation  versus above-the-knee amputation.  We discussed in detail the procedure, benefits, risk, and complications.  She verbalizes her understanding and endorses she has had this procedure before.  I answered all her questions today.  She would like to proceed as soon as possible today.  Patient endorses she has been n.p.o. since midnight last night. - I discussed the plan in detail with Dr. Levora Dredge MD and he agrees with the plan.   [name] [facility] 05/11/2023 7:24 AM

## 2023-05-11 NOTE — Interval H&P Note (Signed)
History and Physical Interval Note:  05/11/2023 4:17 PM  Jackie Macias  has presented today for surgery, with the diagnosis of PAD.  The various methods of treatment have been discussed with the patient and family. After consideration of risks, benefits and other options for treatment, the patient has consented to  Procedure(s): Lower Extremity Angiography (Left) as a surgical intervention.  The patient's history has been reviewed, patient examined, no change in status, stable for surgery.  I have reviewed the patient's chart and labs.  Questions were answered to the patient's satisfaction.     Levora Dredge

## 2023-05-11 NOTE — Progress Notes (Signed)
Progress Note   Patient: Jackie Macias VWU:[id number] DOB: January 03, 1956 DOA: 05/10/2023     1 DOS: the patient was seen and examined on 05/11/2023   Brief hospital course:  Jackie Macias is a 68 y.o. female with medical history significant of chronic left foot wound,  PVD, HTN, HLD, COPD, depression, gout, anemia, tobacco abuse, who presents with left foot pain and wound.  Pt has chronic left foot wound, which has been progressively worsening the past 3 weeks. She has at home wound care of the past few weeks but wasn't getting better.  Pt was seen by Dr. Ether Griffins of podiatry on the day of presentation and found that pt has entire foot gangrene and sent to hospital for further evaluation and treatment.  Dr. Ether Griffins recommends left foot amputation.    Imaging showing findings suspicious for osteomyelitis of the left fourth    Assessment and Plan:   Foot osteomyelitis, left (HCC):  I have reviewed patient's x-ray of the fourth showing findings suspicious for osteomyelitis involving the great toe of the left foot -Continue empiric antimicrobial treatment with vancomycin and zosyn - PRN Zofran for nausea, morphine and oxycodone for pain -Follow-up on culture results -Vascular surgeon on board and case discussed Patient being planned for OR today Continue n.p.o. until after surgery   Peripheral vascular disease (HCC): Pt has history of ischemic left foot ulcer.  Had a CT angio done in December that showed solitary vessel runoff via reconstituted treating her Jackie Macias artery, left-sided dorsalis pedis artery is not identified.  No distal embolization noted.  She had a vascular ultrasound done on 21 January that showed left ABI is within normal limit.  Left toe brachial index is abnormal.  -Hold plavix -continue ASA and Crestor   Essential hypertension: Bp is 177/76 -IV hydralazine as needed  -Continue to hold Lasix Continue amlodipine 5 mg daily   HLD (hyperlipidemia) -Continue Crestor   Chronic  obstructive pulmonary disease (COPD) (HCC): stable -Continue bronchodilators and as needed Mucinex   Iron deficiency anemia: Hemoglobin stable 11.6 (8.1 on 03/13/2023) -Continue iron supplement   Depression -Continue home medications   Tobacco use disorder Continue nicotine patch   DVT ppx: SQ Heparin       Code Status: Full code    Family Communication:    Yes, patient's niece    at bed side.        Disposition Plan:  Anticipate discharge back to previous environment        Subjective:  Patient seen and examined at bedside this morning Denies nausea vomiting abdominal pain chest pain cough Still n.p.o. awaiting amputation  Physical Exam:  General: Not in acute distress.  Dry mucous membrane  Heme: No neck lymph node enlargement. Cardiac: S1/S2, RRR, No murmurs, No gallops or rubs. Respiratory: No rales, wheezing, rhonchi or rubs. GI: Soft, nondistended, nontender, no rebound pain, no organomegaly, BS present. GU: No hematuria Ext: No pitting leg edema bilaterally.  Large wound with foul-smelling involving the left foot, wound involves plantar area with discoloration and gangrenous change.   Vitals:   05/11/23 0430 05/11/23 0530 05/11/23 0630 05/11/23 0819  BP: 135/62 (!) 135/57 (!) 137/54 (!) 147/80  Pulse: 94 91 86 87  Resp: 13 14 13 17   Temp:    98.4 F (36.9 C)  TempSrc:    Oral  SpO2: 92% 93% 93% 95%  Weight:      Height:        Data Reviewed: Have reviewed patient x-ray  of the foot as documented above    Latest Ref Rng & Units 05/11/2023    4:40 AM 05/10/2023    4:24 PM 03/13/2023    7:06 AM  CBC  WBC 4.0 - 10.5 K/uL 6.7  9.3  12.3   Hemoglobin 12.0 - 15.0 g/dL 9.5  11.9  8.1   Hematocrit 36.0 - 46.0 % 29.1  35.0  23.8   Platelets 150 - 400 K/uL 314  408  257        Latest Ref Rng & Units 05/11/2023    4:40 AM 05/10/2023    4:24 PM 03/13/2023    7:06 AM  BMP  Glucose 70 - 99 mg/dL 147  829  562   BUN 8 - 23 mg/dL 15  22  12    Creatinine 0.44  - 1.00 mg/dL 1.30  8.65  7.84   Sodium 135 - 145 mmol/L 134  135  130   Potassium 3.5 - 5.1 mmol/L 4.1  3.9  3.7   Chloride 98 - 111 mmol/L 102  98  98   CO2 22 - 32 mmol/L 24  26  23    Calcium 8.9 - 10.3 mg/dL 8.4  9.4  8.2     Time spent: 56 minutes  Author: Loyce Dys, MD 05/11/2023 3:21 PM  For on call review [email or web address].

## 2023-05-11 NOTE — ED Notes (Signed)
Pt foot wrapped in gauze to protect wound.

## 2023-05-12 DIAGNOSIS — I96 Gangrene, not elsewhere classified: Secondary | ICD-10-CM | POA: Diagnosis not present

## 2023-05-12 DIAGNOSIS — L089 Local infection of the skin and subcutaneous tissue, unspecified: Secondary | ICD-10-CM | POA: Diagnosis not present

## 2023-05-12 DIAGNOSIS — M869 Osteomyelitis, unspecified: Secondary | ICD-10-CM | POA: Diagnosis not present

## 2023-05-12 LAB — CBC WITH DIFFERENTIAL/PLATELET
Abs Immature Granulocytes: 0.03 10*3/uL (ref 0.00–0.07)
Basophils Absolute: 0 10*3/uL (ref 0.0–0.1)
Basophils Relative: 0 %
Eosinophils Absolute: 0 10*3/uL (ref 0.0–0.5)
Eosinophils Relative: 1 %
HCT: 27.1 % — ABNORMAL LOW (ref 36.0–46.0)
Hemoglobin: 8.8 g/dL — ABNORMAL LOW (ref 12.0–15.0)
Immature Granulocytes: 1 %
Lymphocytes Relative: 27 %
Lymphs Abs: 1.6 10*3/uL (ref 0.7–4.0)
MCH: 30.7 pg (ref 26.0–34.0)
MCHC: 32.5 g/dL (ref 30.0–36.0)
MCV: 94.4 fL (ref 80.0–100.0)
Monocytes Absolute: 0.7 10*3/uL (ref 0.1–1.0)
Monocytes Relative: 12 %
Neutro Abs: 3.5 10*3/uL (ref 1.7–7.7)
Neutrophils Relative %: 59 %
Platelets: 297 10*3/uL (ref 150–400)
RBC: 2.87 MIL/uL — ABNORMAL LOW (ref 3.87–5.11)
RDW: 14.7 % (ref 11.5–15.5)
WBC: 5.8 10*3/uL (ref 4.0–10.5)
nRBC: 0 % (ref 0.0–0.2)

## 2023-05-12 LAB — BASIC METABOLIC PANEL
Anion gap: 6 (ref 5–15)
BUN: 11 mg/dL (ref 8–23)
CO2: 24 mmol/L (ref 22–32)
Calcium: 8.4 mg/dL — ABNORMAL LOW (ref 8.9–10.3)
Chloride: 104 mmol/L (ref 98–111)
Creatinine, Ser: 0.65 mg/dL (ref 0.44–1.00)
GFR, Estimated: >60 mL/min (ref >60)
Glucose, Bld: 122 mg/dL — ABNORMAL HIGH (ref 70–99)
Potassium: 3.4 mmol/L — ABNORMAL LOW (ref 3.5–5.1)
Sodium: 134 mmol/L — ABNORMAL LOW (ref 135–145)

## 2023-05-12 LAB — SURGICAL PCR SCREEN
MRSA, PCR: NEGATIVE
Staphylococcus aureus: NEGATIVE

## 2023-05-12 MED ORDER — CHLORHEXIDINE GLUCONATE 4 % EX SOLN
60.0000 mL | Freq: Once | CUTANEOUS | Status: AC
  Administered 2023-05-12: 4 via TOPICAL

## 2023-05-12 MED ORDER — CHLORHEXIDINE GLUCONATE 4 % EX SOLN
60.0000 mL | Freq: Once | CUTANEOUS | Status: AC
  Administered 2023-05-13: 4 via TOPICAL

## 2023-05-12 MED ORDER — POTASSIUM CHLORIDE CRYS ER 20 MEQ PO TBCR
40.0000 meq | EXTENDED_RELEASE_TABLET | ORAL | Status: AC
  Administered 2023-05-12 (×2): 40 meq via ORAL
  Filled 2023-05-12 (×2): qty 2

## 2023-05-12 MED ORDER — MUPIROCIN 2 % EX OINT
1.0000 | TOPICAL_OINTMENT | Freq: Two times a day (BID) | CUTANEOUS | Status: DC
  Administered 2023-05-12 – 2023-05-14 (×5): 1 via NASAL
  Filled 2023-05-12: qty 22

## 2023-05-12 MED ORDER — CEFAZOLIN SODIUM-DEXTROSE 2-4 GM/100ML-% IV SOLN
2.0000 g | INTRAVENOUS | Status: AC
  Administered 2023-05-13: 2 g via INTRAVENOUS
  Filled 2023-05-12: qty 100

## 2023-05-12 MED ORDER — SODIUM CHLORIDE 0.9 % IV SOLN: INTRAVENOUS | Status: DC

## 2023-05-12 NOTE — Care Management Obs Status (Deleted)
MEDICARE OBSERVATION STATUS NOTIFICATION   Patient Details  Name: Jackie Macias MRN: [id number] Date of Birth: 1956-01-15   Medicare Observation Status Notification Given:  Yes    Sherilyn Banker 05/12/2023, 11:40 AM

## 2023-05-12 NOTE — Progress Notes (Signed)
Progress Note   Patient: Jackie Macias ZOX:[id number] DOB: [birth year] DOA: 05/10/2023     2 DOS: the patient was seen and examined on 05/12/2023   Brief hospital course:  JERNI SELMER is a 68 y.o. female with medical history significant of chronic left foot wound,  PVD, HTN, HLD, COPD, depression, gout, anemia, tobacco abuse, who presents with left foot pain and wound.  Pt has chronic left foot wound, which has been progressively worsening the past 3 weeks. She has at home wound care of the past few weeks but wasn't getting better.  Pt was seen by Dr. Ether Griffins of podiatry on the day of presentation and found that pt has entire foot gangrene and sent to hospital for further evaluation and treatment.  Dr. Ether Griffins recommends left foot amputation.    Imaging showing findings suspicious for osteomyelitis of the left fourth     Assessment and Plan:    Foot osteomyelitis, left (HCC):  I have reviewed patient's x-ray of the fourth showing findings suspicious for osteomyelitis involving the great toe of the left foot -Continue empiric antimicrobial treatment with vancomycin and zosyn - PRN Zofran for nausea, morphine and oxycodone for pain -Follow-up on culture results -Plan of care discussed with surgeon today N.p.o. after midnight for surgery tomorrow   Peripheral vascular disease (HCC): Pt has history of ischemic left foot ulcer.  Had a CT angio done in December that showed solitary vessel runoff via reconstituted treating her [name] artery, left-sided dorsalis pedis artery is not identified.  No distal embolization noted.  She had a vascular ultrasound done on 21 January that showed left ABI is within normal limit.  Left toe brachial index is abnormal.  -Hold plavix Continue aspirin and statin therapy   Essential hypertension: Bp is 177/76 -IV hydralazine as needed  -Continue to hold Lasix Continue amlodipine 5 mg daily   HLD (hyperlipidemia) -Continue Crestor   Chronic obstructive pulmonary  disease (COPD) (HCC): stable -Continue bronchodilators and as needed Mucinex   Iron deficiency anemia: Hemoglobin stable 11.6 (8.1 on 03/13/2023) -Continue iron supplement   Depression Continue home medications   Tobacco use disorder Continue nicotine patch   DVT ppx: SQ Heparin       Code Status: Full code    Family Communication:    Yes, patient's niece    at bed side.        Disposition Plan:  Anticipate discharge back to previous environment    Subjective:  Patient seen and examined at bedside this morning Vascular surgery planning surgical intervention tomorrow She denied chest pain nausea or vomiting   Physical Exam:   General: Not in acute distress.  Dry mucous membrane   Heme: No neck lymph node enlargement. Cardiac: S1/S2, RRR, No murmurs, No gallops or rubs. Respiratory: No rales, wheezing, rhonchi or rubs. GI: Soft, nondistended, nontender, no rebound pain, no organomegaly, BS present. GU: No hematuria Ext: No pitting leg edema bilaterally.  Large wound with foul-smelling involving the left foot, wound involves plantar area with discoloration and gangrenous change.   Data Reviewed:    Latest Ref Rng & Units 05/12/2023    5:05 AM 05/11/2023    4:40 AM 05/10/2023    4:24 PM  CBC  WBC 4.0 - 10.5 K/uL 5.8  6.7  9.3   Hemoglobin 12.0 - 15.0 g/dL 8.8  9.5  81.1   Hematocrit 36.0 - 46.0 % 27.1  29.1  35.0   Platelets 150 - 400 K/uL 297  314  408        Latest Ref Rng & Units 05/12/2023    5:05 AM 05/11/2023    4:40 AM 05/10/2023    4:24 PM  BMP  Glucose 70 - 99 mg/dL 161  096  045   BUN 8 - 23 mg/dL 11  15  22    Creatinine 0.44 - 1.00 mg/dL 4.09  8.11  9.14   Sodium 135 - 145 mmol/L 134  134  135   Potassium 3.5 - 5.1 mmol/L 3.4  4.1  3.9   Chloride 98 - 111 mmol/L 104  102  98   CO2 22 - 32 mmol/L 24  24  26    Calcium 8.9 - 10.3 mg/dL 8.4  8.4  9.4      Vitals:   05/11/23 1815 05/11/23 1957 05/12/23 0322 05/12/23 0730  BP: (!) 163/60 112/67 (!)  136/58 (!) 143/59  Pulse: 79 77 78 78  Resp: 15 18 18 16   Temp:  98.2 F (36.8 C) 99.3 F (37.4 C) 98.3 F (36.8 C)  TempSrc:  Oral Oral Oral  SpO2: 95% 97% 97% 95%  Weight:      Height:         Author: Loyce Dys, MD 05/12/2023 6:29 PM  For on call review [email or web address].

## 2023-05-12 NOTE — Progress Notes (Signed)
Progress Note    05/12/2023 9:32 AM 1 Day Post-Op  Subjective: Jackie Macias is a 68 year old female who is now status post day #1 from left lower extremity angiogram.  Left lower extremity remains with gangrene and osteomyelitis.  Patient is resting comfortably in bed this morning.  No complaints overnight.  Vitals all remained stable.   Vitals:   05/12/23 0322 05/12/23 0730  BP: (!) 136/58 (!) 143/59  Pulse: 78 78  Resp: 18 16  Temp: 99.3 F (37.4 C) 98.3 F (36.8 C)  SpO2: 97% 95%   Physical Exam: Cardiac:  regular, without  Murmurs, rubs or gallops; without carotid bruits  Lungs:  normal non-labored breathing, without Rales, rhonchi, wheezing  Incisions: Right groin with dressing clean dry and intact.  No hematoma seroma or infection to note. Extremities:  Unable to palpate pulses in the left popliteal DP/PT. Lower extremity from mid calf to foot is cool to touch. Gangrene obviously noted to left foot from toes to heal. Right lower extremity warm to touch. Palpable DP pulse only.  Abdomen:  Positive bowel sounds throughout, soft, NT/ND, no masses  Neurologic: A&O X 3; No focal weakness or paresthesias are detected; speech is fluent/normal   CBC    Component Value Date/Time   WBC 5.8 05/12/2023 0505   RBC 2.87 (L) 05/12/2023 0505   HGB 8.8 (L) 05/12/2023 0505   HCT 27.1 (L) 05/12/2023 0505   PLT 297 05/12/2023 0505   MCV 94.4 05/12/2023 0505   MCH 30.7 05/12/2023 0505   MCHC 32.5 05/12/2023 0505   RDW 14.7 05/12/2023 0505   LYMPHSABS 1.6 05/12/2023 0505   MONOABS 0.7 05/12/2023 0505   EOSABS 0.0 05/12/2023 0505   BASOSABS 0.0 05/12/2023 0505    BMET    Component Value Date/Time   NA 134 (L) 05/12/2023 0505   NA 138 11/22/2022 1042   K 3.4 (L) 05/12/2023 0505   CL 104 05/12/2023 0505   CO2 24 05/12/2023 0505   GLUCOSE 122 (H) 05/12/2023 0505   BUN 11 05/12/2023 0505   BUN 18 11/22/2022 1042   CREATININE 0.65 05/12/2023 0505   CALCIUM 8.4 (L) 05/12/2023  0505   GFRNONAA >60 05/12/2023 0505   GFRAA >60 09/15/2018 1742    INR    Component Value Date/Time   INR 1.2 05/11/2023 0200     Intake/Output Summary (Last 24 hours) at 05/12/2023 0932 Last data filed at 05/11/2023 2248 Gross per 24 hour  Intake 120 ml  Output --  Net 120 ml     Assessment/Plan:  69 y.o. female is s/p left lower extremity angiogram.  1 Day Post-Op   PLAN: Due to the patient's extensive gangrene and osteomyelitis of the left foot the left lower extremity is nonsalvageable post angiography.  Therefore the patient will require a left below the knee amputation.  Vascular surgery plans on taking the patient to the operating room tomorrow on 03/11/2024 for a left lower extremity below the knee amputation.  I discussed in detail today with the patient the procedure, benefits, risk, and complications.  She verbalizes her understanding and wishes to proceed.  I answered all the patient's questions this morning.  Patient will be made n.p.o. after midnight tonight for left lower extremity below the knee amputation.  I discussed the plan in detail with Dr. Levora Dredge MD and he agrees with the plan.   DVT prophylaxis: ASA 81 mg daily and heparin 5000 units SQ every 8 hours   [employer]  [facility] 05/12/2023 9:32 AM

## 2023-05-12 NOTE — Progress Notes (Signed)
Pharmacy Antibiotic Note  Jackie Macias is a 68 y.o. female admitted on 05/10/2023 with  Osteomyelitis and left foot gangrene . She endorses purulent drainage. PMH includes PVD and prior toe amputations. She has chronic left foot wound which has has reportedly been worseing the past 3 weeks. Pharmacy has been consulted for Vancomycin and Zosyn dosing.   Plan: Day #2 of antibiotics Continue vancomycin 1250 mg IV q24H eAUC 459, Cmax 35, Cmin 10 Scr 0.8, IBW, Vd 0.72 L/kg Anticipating vancomycin level to be checked between 2/14 and 2/15 doses Continue Zosyn 3.375 g IV q8H Continue to monitor renal function to assess for necessary dose adjustments   Height: 5\' 6"  (167.6 cm) Weight: 66.7 kg (147 lb) IBW/kg (Calculated) : 59.3  Temp (24hrs), Avg:98.6 F (37 C), Min:98.2 F (36.8 C), Max:99.3 F (37.4 C)  Recent Labs  Lab 05/10/23 1624 05/10/23 2050 05/11/23 0440 05/12/23 0505  WBC 9.3  --  6.7 5.8  CREATININE 0.92  --  0.67 0.65  LATICACIDVEN 2.3* 1.9  --   --     Estimated Creatinine Clearance: 63 mL/min (by C-G formula based on SCr of 0.65 mg/dL).    Allergies  Allergen Reactions   Lisinopril-Hydrochlorothiazide Other (See Comments)    Antimicrobials this admission: Vancomycin 2/11 >>   Zosyn 2/11 >>    Dose adjustments this admission: N/A  Microbiology results: 2/11 BCx: NGTD  Thank you for allowing pharmacy to be a part of this patient's care.  Will M. Dareen Piano, PharmD Clinical Pharmacist 05/12/2023 12:41 PM

## 2023-05-12 NOTE — Care Management Important Message (Signed)
Important Message  Patient Details  Name: Jackie Macias MRN: [id number] Date of Birth: 1955/07/01   Important Message Given:  Yes - Medicare IM     Sherilyn Banker 05/12/2023, 11:40 AM

## 2023-05-12 NOTE — Consult Note (Addendum)
WOC Nurse Consult Note: Reason for Consult: Consult requested for bilat feet.  Pt was followed by podiatry prior to admission and currently the vascular team is following for possible surgical intervention. Left foot with full thickness gangrenous wound which extends to the inner and plantar foot, 100% soft eschar with strong foul odor, mod amt tan drainage, 11X8X.8cm, some areas where eschar has lifted away from skin. Outer and anterior foot and all toes with dry eschar.  Topical treatment will not be effective towards promoting healing; please refer to vascular or podiatry service for further plan of care.   Right heel with pink dry scar tissue from previous pressure injury which has healed.  Loose dry brown scabbed skin trimmed from wound edges.  Right plantar foot with pink dry calloused skin which has been recently been trimmed.  No role for topical treatment to right foot except to protect from further injury.   Float left foot to reduce pressure. Topical treatment orders provided for bedside nurses to perform as follows until further input is available from the vascular/podiatry service:  1. Apply Xeroform gauze to left foot wounds Q day, then cover with ABD pad and kerlex 2. Foam dressing to right heel, change Q 3 days or PRN soiling. Please re-consult if further assistance is needed.  Thank-you,  Cammie Mcgee MSN, RN, CWOCN, [place], CNS [phone]

## 2023-05-13 ENCOUNTER — Encounter
Admission: EM | Disposition: A | Discharge status: Skilled Nursing Facility | Payer: Self-pay | Source: Home / Self Care | Attending: Internal Medicine

## 2023-05-13 ENCOUNTER — Inpatient Hospital Stay: Payer: Medicare HMO | Admitting: Anesthesiology

## 2023-05-13 ENCOUNTER — Encounter: Payer: Self-pay | Admitting: Internal Medicine

## 2023-05-13 ENCOUNTER — Other Ambulatory Visit: Payer: Self-pay

## 2023-05-13 ENCOUNTER — Telehealth (INDEPENDENT_AMBULATORY_CARE_PROVIDER_SITE_OTHER): Payer: Medicare HMO | Admitting: Vascular Surgery

## 2023-05-13 ENCOUNTER — Inpatient Hospital Stay: Payer: Medicare HMO

## 2023-05-13 DIAGNOSIS — L089 Local infection of the skin and subcutaneous tissue, unspecified: Secondary | ICD-10-CM | POA: Diagnosis not present

## 2023-05-13 DIAGNOSIS — I96 Gangrene, not elsewhere classified: Secondary | ICD-10-CM | POA: Diagnosis not present

## 2023-05-13 DIAGNOSIS — I70262 Atherosclerosis of native arteries of extremities with gangrene, left leg: Secondary | ICD-10-CM | POA: Diagnosis not present

## 2023-05-13 DIAGNOSIS — I7025 Atherosclerosis of native arteries of other extremities with ulceration: Secondary | ICD-10-CM

## 2023-05-13 DIAGNOSIS — M869 Osteomyelitis, unspecified: Secondary | ICD-10-CM | POA: Diagnosis not present

## 2023-05-13 HISTORY — PX: AMPUTATION: SHX166

## 2023-05-13 LAB — BASIC METABOLIC PANEL
Anion gap: 9 (ref 5–15)
BUN: 12 mg/dL (ref 8–23)
CO2: 20 mmol/L — ABNORMAL LOW (ref 22–32)
Calcium: 8.7 mg/dL — ABNORMAL LOW (ref 8.9–10.3)
Chloride: 106 mmol/L (ref 98–111)
Creatinine, Ser: 0.8 mg/dL (ref 0.44–1.00)
GFR, Estimated: >60 mL/min (ref >60)
Glucose, Bld: 104 mg/dL — ABNORMAL HIGH (ref 70–99)
Potassium: 4.5 mmol/L (ref 3.5–5.1)
Sodium: 135 mmol/L (ref 135–145)

## 2023-05-13 LAB — CBC WITH DIFFERENTIAL/PLATELET
Abs Immature Granulocytes: 0.04 10*3/uL (ref 0.00–0.07)
Basophils Absolute: 0 10*3/uL (ref 0.0–0.1)
Basophils Relative: 0 %
Eosinophils Absolute: 0 10*3/uL (ref 0.0–0.5)
Eosinophils Relative: 1 %
HCT: 29.2 % — ABNORMAL LOW (ref 36.0–46.0)
Hemoglobin: 9.3 g/dL — ABNORMAL LOW (ref 12.0–15.0)
Immature Granulocytes: 1 %
Lymphocytes Relative: 21 %
Lymphs Abs: 1.6 10*3/uL (ref 0.7–4.0)
MCH: 31.1 pg (ref 26.0–34.0)
MCHC: 31.8 g/dL (ref 30.0–36.0)
MCV: 97.7 fL (ref 80.0–100.0)
Monocytes Absolute: 0.8 10*3/uL (ref 0.1–1.0)
Monocytes Relative: 11 %
Neutro Abs: 4.9 10*3/uL (ref 1.7–7.7)
Neutrophils Relative %: 66 %
Platelets: 266 10*3/uL (ref 150–400)
RBC: 2.99 MIL/uL — ABNORMAL LOW (ref 3.87–5.11)
RDW: 14.9 % (ref 11.5–15.5)
WBC: 7.4 10*3/uL (ref 4.0–10.5)
nRBC: 0 % (ref 0.0–0.2)

## 2023-05-13 SURGERY — AMPUTATION BELOW KNEE
Anesthesia: General | Site: Knee | Laterality: Left

## 2023-05-13 MED ORDER — MIDAZOLAM HCL 2 MG/2ML IJ SOLN
INTRAMUSCULAR | Status: AC
Start: 2023-05-13 — End: ?
  Filled 2023-05-13: qty 2

## 2023-05-13 MED ORDER — HYDRALAZINE HCL 20 MG/ML IJ SOLN: 5.0000 mg | Freq: Once | INTRAMUSCULAR | Status: DC | PRN

## 2023-05-13 MED ORDER — MORPHINE SULFATE (PF) 2 MG/ML IV SOLN
2.0000 mg | INTRAVENOUS | Status: DC | PRN
  Administered 2023-05-13 – 2023-05-14 (×4): 4 mg via INTRAVENOUS
  Administered 2023-05-15: 2 mg via INTRAVENOUS
  Administered 2023-05-15: 4 mg via INTRAVENOUS
  Filled 2023-05-13 (×5): qty 2
  Filled 2023-05-13: qty 1

## 2023-05-13 MED ORDER — BUPIVACAINE LIPOSOME 1.3 % IJ SUSP
INTRAMUSCULAR | Status: AC
  Filled 2023-05-13: qty 20

## 2023-05-13 MED ORDER — BUPIVACAINE HCL (PF) 0.5 % IJ SOLN
INTRAMUSCULAR | Status: DC | PRN
  Administered 2023-05-13: 20 mL via PERINEURAL

## 2023-05-13 MED ORDER — BUPIVACAINE HCL (PF) 0.5 % IJ SOLN
INTRAMUSCULAR | Status: AC
  Filled 2023-05-13: qty 30

## 2023-05-13 MED ORDER — BUPIVACAINE LIPOSOME 1.3 % IJ SUSP
INTRAMUSCULAR | Status: DC | PRN
Start: 2023-05-13 — End: 2023-05-13
  Administered 2023-05-13: 20 mL via PERINEURAL

## 2023-05-13 MED ORDER — FENTANYL CITRATE (PF) 100 MCG/2ML IJ SOLN
INTRAMUSCULAR | Status: AC
  Filled 2023-05-13: qty 2

## 2023-05-13 MED ORDER — 0.9 % SODIUM CHLORIDE (POUR BTL) OPTIME
TOPICAL | Status: DC | PRN
  Administered 2023-05-13: 1000 mL

## 2023-05-13 MED ORDER — MIDAZOLAM HCL 2 MG/2ML IJ SOLN: INTRAMUSCULAR | Status: DC | PRN

## 2023-05-13 MED ORDER — ONDANSETRON HCL 4 MG/2ML IJ SOLN
INTRAMUSCULAR | Status: AC
  Filled 2023-05-13: qty 2

## 2023-05-13 MED ORDER — VASHE WOUND IRRIGATION OPTIME
TOPICAL | Status: DC | PRN
  Administered 2023-05-13: 34 [oz_av]

## 2023-05-13 MED ORDER — PROPOFOL 500 MG/50ML IV EMUL
INTRAVENOUS | Status: DC | PRN
Start: 2023-05-13 — End: 2023-05-13
  Administered 2023-05-13: 30 ug/kg/min via INTRAVENOUS

## 2023-05-13 MED ORDER — PROPOFOL 10 MG/ML IV BOLUS
INTRAVENOUS | Status: AC
  Filled 2023-05-13: qty 20

## 2023-05-13 MED ORDER — ONDANSETRON HCL 4 MG/2ML IJ SOLN
INTRAMUSCULAR | Status: DC | PRN
  Administered 2023-05-13: 4 mg via INTRAVENOUS

## 2023-05-13 MED ORDER — MIDAZOLAM HCL 2 MG/2ML IJ SOLN
INTRAMUSCULAR | Status: AC
  Filled 2023-05-13: qty 2

## 2023-05-13 MED ORDER — LIDOCAINE HCL (CARDIAC) PF 100 MG/5ML IV SOSY
PREFILLED_SYRINGE | INTRAVENOUS | Status: DC | PRN
  Administered 2023-05-13: 60 mg via INTRAVENOUS

## 2023-05-13 MED ORDER — DROPERIDOL 2.5 MG/ML IJ SOLN: 0.6250 mg | Freq: Once | INTRAMUSCULAR | Status: DC | PRN

## 2023-05-13 MED ORDER — PROPOFOL 1000 MG/100ML IV EMUL
INTRAVENOUS | Status: AC
  Filled 2023-05-13: qty 100

## 2023-05-13 MED ORDER — FENTANYL CITRATE (PF) 100 MCG/2ML IJ SOLN
INTRAMUSCULAR | Status: DC | PRN
  Administered 2023-05-13 (×2): 25 ug via INTRAVENOUS

## 2023-05-13 MED ORDER — BUPIVACAINE HCL (PF) 0.5 % IJ SOLN
INTRAMUSCULAR | Status: AC
  Filled 2023-05-13: qty 20

## 2023-05-13 MED ORDER — OXYCODONE-ACETAMINOPHEN 5-325 MG PO TABS
1.0000 | ORAL_TABLET | ORAL | Status: DC | PRN
  Administered 2023-05-13 – 2023-05-16 (×9): 2 via ORAL
  Administered 2023-05-17 – 2023-05-18 (×3): 1 via ORAL
  Administered 2023-05-18: 2 via ORAL
  Administered 2023-05-18 – 2023-05-19 (×3): 1 via ORAL
  Filled 2023-05-13: qty 1
  Filled 2023-05-13: qty 2
  Filled 2023-05-13: qty 1
  Filled 2023-05-13: qty 2
  Filled 2023-05-13: qty 1
  Filled 2023-05-13 (×2): qty 2
  Filled 2023-05-13: qty 1
  Filled 2023-05-13: qty 2
  Filled 2023-05-13: qty 1
  Filled 2023-05-13 (×5): qty 2
  Filled 2023-05-13: qty 1
  Filled 2023-05-13: qty 2

## 2023-05-13 SURGICAL SUPPLY — 43 items
BAG COUNTER SPONGE SURGICOUNT (BAG) ×1 IMPLANT
BLADE SAGITTAL WIDE XTHICK NO (BLADE) ×1 IMPLANT
BLADE SURG SZ10 CARB STEEL (BLADE) ×1 IMPLANT
BNDG COHESIVE 4X5 TAN STRL LF (GAUZE/BANDAGES/DRESSINGS) ×1 IMPLANT
BNDG GAUZE DERMACEA FLUFF 4 (GAUZE/BANDAGES/DRESSINGS) IMPLANT
BNDG STRETCH 4X75 STRL LF (GAUZE/BANDAGES/DRESSINGS) IMPLANT
BRUSH SCRUB EZ 4% CHG (MISCELLANEOUS) ×1 IMPLANT
CHLORAPREP W/TINT 26 (MISCELLANEOUS) ×1 IMPLANT
CLEANSER WND VASHE INSTL 34OZ (WOUND CARE) IMPLANT
DRAPE INCISE IOBAN 66X45 STRL (DRAPES) ×1 IMPLANT
DRSG GAUZE FLUFF 36X18 (GAUZE/BANDAGES/DRESSINGS) IMPLANT
DRSG XEROFORM 1X8 (GAUZE/BANDAGES/DRESSINGS) IMPLANT
ELECT CAUTERY BLADE 6.4 (BLADE) ×1 IMPLANT
ELECT REM PT RETURN 9FT ADLT (ELECTROSURGICAL) ×1 IMPLANT
ELECTRODE REM PT RTRN 9FT ADLT (ELECTROSURGICAL) ×1 IMPLANT
GAUZE SPONGE 4X4 12PLY STRL (GAUZE/BANDAGES/DRESSINGS) IMPLANT
GLOVE INDICATOR 7.5 STRL GRN (GLOVE) ×1 IMPLANT
GLOVE SURG SYN 8.0 (GLOVE) ×1 IMPLANT
GLOVE SURG SYN 8.0 PF PI (GLOVE) ×1 IMPLANT
GOWN STRL REUS W/ TWL LRG LVL3 (GOWN DISPOSABLE) ×2 IMPLANT
GOWN STRL REUS W/ TWL XL LVL3 (GOWN DISPOSABLE) ×1 IMPLANT
HANDLE YANKAUER SUCT BULB TIP (MISCELLANEOUS) ×1 IMPLANT
KIT TURNOVER KIT A (KITS) ×1 IMPLANT
MANIFOLD NEPTUNE II (INSTRUMENTS) ×1 IMPLANT
MAT ABSORB FLUID 56X50 GRAY (MISCELLANEOUS) ×1 IMPLANT
NS IRRIG 500ML POUR BTL (IV SOLUTION) ×1 IMPLANT
PACK EXTREMITY ARMC (MISCELLANEOUS) ×1 IMPLANT
PAD ABD DERMACEA PRESS 5X9 (GAUZE/BANDAGES/DRESSINGS) IMPLANT
PAD PREP OB/GYN DISP 24X41 (PERSONAL CARE ITEMS) ×1 IMPLANT
SPONGE T-LAP 18X18 ~~LOC~~+RFID (SPONGE) ×2 IMPLANT
STAPLER SKIN PROX 35W (STAPLE) IMPLANT
STOCKINETTE M/LG 89821 (MISCELLANEOUS) ×1 IMPLANT
SUT SILK 2-0 18XBRD TIE 12 (SUTURE) ×1 IMPLANT
SUT SILK 3-0 18XBRD TIE 12 (SUTURE) ×1 IMPLANT
SUT VIC AB 0 CT1 36 (SUTURE) ×4 IMPLANT
SUT VIC AB 3-0 SH 27X BRD (SUTURE) ×2 IMPLANT
SUT VICRYL PLUS ABS 0 54 (SUTURE) ×1 IMPLANT
SYR 20ML LL LF (SYRINGE) ×2 IMPLANT
TAPE UMBILICAL 1/8 X36 TWILL (MISCELLANEOUS) ×1 IMPLANT
TOWEL OR 17X26 4PK STRL BLUE (TOWEL DISPOSABLE) ×2 IMPLANT
TRAP FLUID SMOKE EVACUATOR (MISCELLANEOUS) ×1 IMPLANT
WATER STERILE IRR 500ML POUR (IV SOLUTION) ×1 IMPLANT
YANKAUER SUCT BULB TIP NO VENT (SUCTIONS) IMPLANT

## 2023-05-13 NOTE — Op Note (Signed)
   OPERATIVE NOTE   PROCEDURE: Left below-the-knee amputation  PRE-OPERATIVE DIAGNOSIS: Left foot gangrene  POST-OPERATIVE DIAGNOSIS: same as above  SURGEON: Renford Dills, MD  ASSISTANT(S): Rolla Plate, NP  ANESTHESIA: general  ESTIMATED BLOOD LOSS: 500 cc  FINDING(S): Viable muscle bellies  SPECIMEN(S):  Left below-the-knee amputation  INDICATIONS:   Jackie Macias is a 68 y.o. female who presents with left leg gangrene.  The patient is scheduled for a left below-the-knee amputation.  I discussed in depth with the patient the risks, benefits, and alternatives to this procedure.  The patient is aware that the risk of this operation included but are not limited to:  bleeding, infection, myocardial infarction, stroke, death, failure to heal amputation wound, and possible need for more proximal amputation.  The patient is aware of the risks and agrees proceed forward with the procedure.  DESCRIPTION:  After full informed written consent was obtained from the patient, the patient was brought back to the operating room, and placed supine upon the operating table.  Prior to induction, the patient received IV antibiotics.  The patient was then prepped and draped in the standard fashion for a below-the-knee amputation.  After obtaining adequate anesthesia, the patient was prepped and draped in the standard fashion for a left below-the-knee amputation.  I marked out the anterior incision two finger breadths below the tibial tuberosity and then the marked out a posterior flap that was one third of the circumference of the calf in length.   I made the incisions for these flaps, and then dissected through the subcutaneous tissue, fascia, and muscle anteriorly.  I elevated  the periosteal tissue superiorly so that the tibia was about 3-4 cm shorter than the anterior skin flap.  I then transected the tibia with a power saw and then took a wedge off the tibia anteriorly with the power saw.  Then I  smoothed out the rough edges.  In a similar fashion, I cut back the fibula about two centimeters higher than the level of the tibia with a bone cutter.  I put a bone hook into the distal tibia and then used a large amputation knife to sharply develop a tissue plane through the muscle along the fibula.  In such fashion, the posterior flap was developed.  At this point, the specimen was passed off the field as the below-the-knee amputation.  At this point, I clamped all visibly bleeding arteries and veins using a combination of suture ligation with Silk suture and electrocautery.  Bleeding continued to be controlled with electrocautery and suture ligature.  The stump was washed off with sterile normal saline and no further active bleeding was noted.  I reapproximated the anterior and posterior fascia  with interrupted stitches of 0 Vicryl.  This was completed along the entire length of anterior and posterior fascia until there were no more loose space in the fascial line. I then placed a layer of 2-0 Vicryl sutures in the subcutaneous tissue. The skin was then  reapproximated with staples.  The stump was washed off and dried.  The incision was dressed with Xeroform and  then fluffs were applied.  Kerlix was wrapped around the leg and then gently an ACE wrap was applied.    COMPLICATIONS: none  CONDITION: stable   Levora Dredge  05/13/2023, 12:54 PM    This note was created with Dragon Medical transcription system. Any errors in dictation are purely unintentional.

## 2023-05-13 NOTE — Progress Notes (Signed)
Progress Note   Patient: Jackie Macias:[id number] DOB: Feb 12, 1956 DOA: 05/10/2023     3 DOS: the patient was seen and examined on 05/13/2023     Brief hospital course:  Jackie Macias is a 68 y.o. female with medical history significant of chronic left foot wound,  PVD, HTN, HLD, COPD, depression, gout, anemia, tobacco abuse, who presents with left foot pain and wound.  Pt has chronic left foot wound, which has been progressively worsening the past 3 weeks. She has at home wound care of the past few weeks but wasn't getting better.  Pt was seen by Dr. Ether Griffins of podiatry on the day of presentation and found that pt has entire foot gangrene and sent to hospital for further evaluation and treatment.  Dr. Ether Griffins recommends left foot amputation.    Imaging showing findings suspicious for osteomyelitis of the left fourth     Assessment and Plan:    Foot osteomyelitis, left (HCC):  I have reviewed patient's x-ray of the fourth showing findings suspicious for osteomyelitis involving the great toe of the left foot Continue empiric antimicrobial treatment with vancomycin and zosyn Continue as needed Zofran Patient was taken for left below-knee amputation by vascular surgeon today Continue as needed pain medication   Peripheral vascular disease (HCC): Pt has history of ischemic left foot ulcer.  Had a CT angio done in December that showed solitary vessel runoff via reconstituted treating her [name] artery, left-sided dorsalis pedis artery is not identified.  No distal embolization noted.  She had a vascular ultrasound done on 21 January that showed left ABI is within normal limit.  Left toe brachial index is abnormal.  -Hold plavix Continue aspirin and statin therapy   Essential hypertension: Bp is 177/76 -IV hydralazine as needed  -Continue to hold Lasix Continue amlodipine 5 mg daily   HLD (hyperlipidemia) -Continue Crestor   Chronic obstructive pulmonary disease (COPD) (HCC):  stable -Continue bronchodilators and as needed Mucinex   Iron deficiency anemia: Hemoglobin stable 11.6 (8.1 on 03/13/2023) Continue iron supplement   Depression Continue home medications   Tobacco use disorder Continue nicotine patch   DVT ppx: SQ Heparin       Code Status: Full code    Family Communication:    Yes, patient's niece    at bed side.        Disposition Plan:  Anticipate discharge back to previous environment     Subjective:  Patient seen and examined at bedside this morning Denies nausea vomiting abdominal pain chest pain cough     Physical Exam:   General: Not in acute distress.  Dry mucous membrane   Heme: No neck lymph node enlargement. Cardiac: S1/S2, RRR, No murmurs, No gallops or rubs. Respiratory: No rales, wheezing, rhonchi or rubs. GI: Soft, nondistended, nontender, no rebound pain, no organomegaly, BS present. GU: No hematuria Ext: No pitting leg edema bilaterally.  Left below-knee amputation with dressing that is clean and dry   Data Reviewed:   Vitals:   05/13/23 1330 05/13/23 1346 05/13/23 1422 05/13/23 1511  BP: (!) 118/55 (!) 116/57 111/65 103/63  Pulse:   69 74  Resp: 11  18 18   Temp: (!) 97.2 F (36.2 C) 98 F (36.7 C) 98.4 F (36.9 C) 98.2 F (36.8 C)  TempSrc:  Oral    SpO2: 94% 100% 97% 100%  Weight:      Height:          Latest Ref Rng & Units 05/13/2023  5:10 AM 05/12/2023    5:05 AM 05/11/2023    4:40 AM  CBC  WBC 4.0 - 10.5 K/uL 7.4  5.8  6.7   Hemoglobin 12.0 - 15.0 g/dL 9.3  8.8  9.5   Hematocrit 36.0 - 46.0 % 29.2  27.1  29.1   Platelets 150 - 400 K/uL 266  297  314        Latest Ref Rng & Units 05/13/2023    5:10 AM 05/12/2023    5:05 AM 05/11/2023    4:40 AM  BMP  Glucose 70 - 99 mg/dL 454  098  119   BUN 8 - 23 mg/dL 12  11  15    Creatinine 0.44 - 1.00 mg/dL 1.47  8.29  5.62   Sodium 135 - 145 mmol/L 135  134  134   Potassium 3.5 - 5.1 mmol/L 4.5  3.4  4.1   Chloride 98 - 111 mmol/L 106  104  102    CO2 22 - 32 mmol/L 20  24  24    Calcium 8.9 - 10.3 mg/dL 8.7  8.4  8.4      Author: Loyce Dys, MD 05/13/2023 5:07 PM  For on call review [email or web address].

## 2023-05-13 NOTE — Op Note (Signed)
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  Abdominal aortogram  2.  Selective injection of the left lower extremity third order catheter placement  3.  Ultrasound-guided access to the right common femoral artery  4.  StarClose right femoral artery    Anesthesia: Conscious sedation was administered by the interventional radiology RN under my direct supervision. IV Versed plus fentanyl were utilized. Continuous ECG, pulse oximetry and blood pressure was monitored throughout the entire procedure.  Conscious sedation was administered for a total of 39 minutes.  Sheath: 5 French 11 cm Pinnacle sheath retrograde right common femoral  Contrast: 40 cc   Fluoroscopy Time: 3.3 minutes  Indications:  The patient presents to [facility] with atherosclerotic occlusive disease bilateral lower extremities with gangrenous changes to the forefoot and plantar surface.  Pedal pulses are nonpalpable bilaterally suggesting hemodynamically significant atherosclerotic occlusive disease.  The risks and benefits as well as alternative therapies for lower extremity revascularization are reviewed with the patient all questions are answered the patient agrees to proceed.  The patient is therefore undergoing angiography with the hope for intervention for limb salvage.   Procedure:  Jackie Macias a 68 y.o. female who was identified and appropriate procedural time out was performed.  The patient was then placed supine on the table and prepped and draped in the usual sterile fashion.  Ultrasound was used to evaluate the right common femoral artery.  It was echolucent and pulsatile indicating it is patent .  An ultrasound image  was acquired for the permanent record.  A micropuncture needle was used to access the right common femoral artery under direct ultrasound guidance.  The microwire was then advanced under fluoroscopic guidance without difficulty followed by the micro-sheath.  A 0.035 J wire was advanced without resistance and a 5Fr sheath was placed.    Pigtail catheter was then advanced to the level of T12 and AP projection of the aorta was obtained. Pigtail catheter was then repositioned to above the bifurcation and RAO view of the pelvis was obtained. Stiff angled Glidewire and pigtail catheter was then used across the bifurcation and the catheter was positioned in the distal external iliac artery.  LAO of the left groin was then obtained. Wire was reintroduced and negotiated into the SFA and the catheter was advanced into the SFA. Distal runoff was then performed.  After review of the images the catheter was removed over wire and an RAO view of the groin was obtained. StarClose device was deployed without difficulty.   Findings:   Aortogram: The abdominal aorta is opacified with a bolus injection contrast.  Single renal arteries are noted bilaterally with normal nephrograms.  No evidence of hemodynamically significant renal artery stenosis.  There are no hemodynamically significant stenoses identified within the aorta.  The aortic bifurcation is mildly diseased but widely patent.  Bilateral common internal and external iliac arteries are free of hemodynamically significant lesions.  Previously placed left iliac stent is widely patent  Left lower Extremity: The left common femoral, profunda femoris superficial femoral and popliteal arteries demonstrate diffuse atherosclerotic changes but there are no hemodynamically significant lesions.  The previously placed stent within the mid and distal SFA as well as the popliteal artery is widely patent.  Trifurcation is diffusely diseased with occlusion of  the anterior tibial and  posterior tibial arteries there is single vessel runoff to the foot via the peroneal which is widely patent down to the ankle but collateralizes poorly across the ankle and there is very poor filling of the forefoot..  SUMMARY: Based on these images no intervention is performed at this time.  The patient does appear to have suitable anatomy for a below-knee amputation as her vascular reconstruction is still patent.    Disposition: Patient was taken to the recovery room in stable condition having tolerated the procedure well.  [name] 05/13/2023,1:38 PM

## 2023-05-13 NOTE — Anesthesia Procedure Notes (Signed)
Anesthesia Regional Block: Popliteal block   Pre-Anesthetic Checklist: , timeout performed,  Correct Patient, Correct Site, Correct Laterality,  Correct Procedure, Correct Position, site marked,  Risks and benefits discussed,  Surgical consent,  Pre-op evaluation,  At surgeon's request and post-op pain management  Laterality: Left  Prep: chloraprep       Needles:  Injection technique: Single-shot  Needle Type: Stimiplex     Needle Length: 9cm  Needle Gauge: 22     Additional Needles:   Procedures:,,,, ultrasound used (permanent image in chart),,    Narrative:  Start time: 05/13/2023 10:24 AM End time: 05/13/2023 10:28 AM Injection made incrementally with aspirations every 5 mL.  Performed by: Personally  Anesthesiologist: Foye Deer, MD  Additional Notes: Patient consented for risk and benefits of nerve block including but not limited to nerve damage, Horner's syndrome, failed block, bleeding and infection.  Patient voiced understanding.  Functioning IV was confirmed and monitors were applied.  Timeout done prior to procedure and prior to any sedation being given to the patient.  Patient confirmed procedure site prior to any sedation given to the patient.  A 50mm 22ga Stimuplex needle was used. Sterile prep,hand hygiene and sterile gloves were used.  Minimal sedation used for procedure.  No paresthesia endorsed by patient during the procedure.  Negative aspiration and negative test dose prior to incremental administration of local anesthetic. The patient tolerated the procedure well with no immediate complications.

## 2023-05-13 NOTE — Anesthesia Preprocedure Evaluation (Addendum)
Anesthesia Evaluation  Patient identified by MRN, date of birth, ID band Patient awake    Reviewed: Allergy & Precautions, H&P , NPO status , Patient's Chart, lab work & pertinent test results  Airway Mallampati: III  TM Distance: >3 FB Neck ROM: full    Dental   Pulmonary COPD, Current Smoker and Patient abstained from smoking. Every Day - 50 pack years   Pulmonary exam normal        Cardiovascular hypertension, + Peripheral Vascular Disease (s/p LE stents on Plavix; non-critical carotid stenosis)  Normal cardiovascular exam     Neuro/Psych  PSYCHIATRIC DISORDERS  Depression    negative neurological ROS     GI/Hepatic Neg liver ROS,GERD  ,,  Endo/Other  negative endocrine ROS    Renal/GU negative Renal ROS  negative genitourinary   Musculoskeletal  (+) Arthritis ,    Abdominal Normal abdominal exam  (+)   Peds  Hematology  (+) Blood dyscrasia, anemia Iron deficiency anemia: Hemoglobin stable 11.6 (8.1 on 03/13/2023)   Anesthesia Other Findings Foot osteomyelitis, left  Past Medical History: No date: Arthritis No date: Bronchitis No date: COPD (chronic obstructive pulmonary disease) (HCC) No date: GERD (gastroesophageal reflux disease) No date: Gout No date: Hypertension  Past Surgical History: 07/29/2017: AMPUTATION; Right     Comment:  Procedure: AMPUTATION DIGIT/ 13086;  Surgeon: Linus Galas, DPM;  Location: [facility] ORS;  Service: Podiatry;                Laterality: Right; 02/18/2021: AMPUTATION TOE; Right     Comment:  Procedure: AMPUTATION TOE-GREAT TOE;  Surgeon: Gwyneth Revels, DPM;  Location: [facility] ORS;  Service: Podiatry;                Laterality: Right; No date: CHOLECYSTECTOMY No date: facial biopsy; Right 12/2016: JOINT REPLACEMENT; Right     Comment:  THR 09/27/2017: LOWER EXTREMITY ANGIOGRAPHY; Right     Comment:  Procedure: LOWER EXTREMITY ANGIOGRAPHY;  Surgeon:                Renford Dills, MD;  Location: [facility];               Service: Cardiovascular;  Laterality: Right; 06/17/2020: LOWER EXTREMITY ANGIOGRAPHY; Right     Comment:  Procedure: LOWER EXTREMITY ANGIOGRAPHY;  Surgeon:               Renford Dills, MD;  Location: [facility];               Service: Cardiovascular;  Laterality: Right; 02/18/2021: LOWER EXTREMITY ANGIOGRAPHY; Right     Comment:  Procedure: LOWER EXTREMITY ANGIOGRAPHY;  Surgeon:               Renford Dills, MD;  Location: [facility];               Service: Cardiovascular;  Laterality: Right; 09/07/2022: LOWER EXTREMITY ANGIOGRAPHY; Left     Comment:  Procedure: Lower Extremity Angiography;  Surgeon:               Renford Dills, MD;  Location: [facility];               Service: Cardiovascular;  Laterality: Left; 03/07/2023: LOWER EXTREMITY ANGIOGRAPHY; Left     Comment:  Procedure: Lower Extremity Angiography;  Surgeon: Annice Needy, MD;  Location: [facility];  Service:               Cardiovascular;  Laterality: Left; 03/08/2023: LOWER EXTREMITY ANGIOGRAPHY; Left     Comment:  Procedure: Lower Extremity Angiography;  Surgeon:               Renford Dills, MD;  Location: [facility];               Service: Cardiovascular;  Laterality: Left; No date: TONSILLECTOMY 01/17/2017: TOTAL HIP ARTHROPLASTY; Right     Comment:  Procedure: TOTAL HIP ARTHROPLASTY ANTERIOR APPROACH;                Surgeon: Lyndle Herrlich, MD;  Location: [facility] OR[facility];                Service: Orthopedics;  Laterality: Right;  BMI    Body Mass Index: 23.73 kg/m      Reproductive/Obstetrics negative OB ROS                             Anesthesia Physical Anesthesia Plan  ASA: 3  Anesthesia Plan: General   Post-op Pain Management: Regional block*   Induction: Intravenous  PONV Risk Score and Plan: Propofol infusion and  TIVA  Airway Management Planned: Natural Airway and Simple Face Mask  Additional Equipment:   Intra-op Plan:   Post-operative Plan:   Informed Consent: I have reviewed the patients History and Physical, chart, labs and discussed the procedure including the risks, benefits and alternatives for the proposed anesthesia with the patient or authorized representative who has indicated his/her understanding and acceptance.     Dental Advisory Given  Plan Discussed with: CRNA and Surgeon  Anesthesia Plan Comments:         Anesthesia Quick Evaluation

## 2023-05-13 NOTE — TOC Initial Note (Signed)
Transition of Care [facility]) - Initial/Assessment Note    Patient Details  Name: Jackie Macias MRN: [id number] Date of Birth: 03-18-1956  Transition of Care [facility] CM/SW Contact:    Chapman Fitch, RN Phone Number: 05/13/2023, 12:42 PM  Clinical Narrative:                  Patient off unit for left BKA today Per Chart review lives at home with fiance curtis [facility] - [facility] [address], and has RW, WC, and drop arm bedside commode  Will need PT OT eval post op       Patient Goals and CMS Choice            Expected Discharge Plan and Services                                              Prior Living Arrangements/Services                       Activities of Daily Living   ADL Screening (condition at time of admission) Independently performs ADLs?: Yes (appropriate for developmental age) Is the patient deaf or have difficulty hearing?: No Does the patient have difficulty seeing, even when wearing glasses/contacts?: No Does the patient have difficulty concentrating, remembering, or making decisions?: No  Permission Sought/Granted                  Emotional Assessment              Admission diagnosis:  Gangrene (HCC) [I96] Foot infection [L08.9] Osteomyelitis of left foot (HCC) [M86.9] Left foot infection [L08.9] Osteomyelitis of left foot, unspecified type (HCC) [M86.9] Patient Active Problem List   Diagnosis Date Noted   Foot infection 05/11/2023   Gangrene (HCC) 05/11/2023   Foot osteomyelitis, left (HCC) 05/10/2023   HLD (hyperlipidemia) 05/10/2023   Depression 05/10/2023   Osteomyelitis of left foot (HCC) 05/10/2023   Generalized weakness 03/14/2023   Hypokalemia 03/13/2023   Hyponatremia 03/13/2023   Lower extremity edema 03/13/2023   Obesity (BMI 30-39.9) 03/13/2023   Ischemic foot 03/07/2023   Left foot pain 03/07/2023   Acute lower limb ischemia 03/06/2023   Prediabetes 11/22/2022   Vitamin D deficiency,  unspecified 11/22/2022   Atherosclerosis of native arteries of the extremities with ulceration (HCC) 08/12/2022   Claustrophobia 05/19/2021   Low back pain 10/21/2020   Atherosclerosis of native arteries of extremity with intermittent claudication (HCC) 07/28/2020   Enthesopathy of hip region 06/06/2020   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   Chronic obstructive pulmonary disease (COPD) (HCC) 01/17/2020   Parotid mass 10/05/2019   Carotid stenosis 10/23/2018   GERD (gastroesophageal reflux disease) 10/23/2018   Intracranial vascular stenosis 10/23/2018   Mixed hyperlipidemia 05/26/2018   Gout 03/20/2018   Essential hypertension 09/08/2017   Peripheral vascular disease (HCC) 09/08/2017   Status post THR (total hip replacement) 01/17/2017   Primary osteoarthritis of right hip 01/05/2017   Gastro-esophageal reflux disease with esophagitis, without bleeding 02/28/2012   Helicobacter pylori infection 08/17/2010   Iron deficiency anemia 08/17/2010   Polyarthritis, unspecified 08/17/2010   Tobacco use disorder 08/17/2010   History of colonic polyps 07/02/2010   Internal hemorrhoids 07/02/2010   PCP:  Miki Kins, FNP Pharmacy:   [id number] - [place] [address]), [place] - [address]  [place]) [place] [address] Phone: [phone] Fax: [phone]     Social Drivers of Health (SDOH) Social History: SDOH Screenings   Food Insecurity: No Food Insecurity (05/11/2023)  Housing: High Risk (05/11/2023)  Transportation Needs: No Transportation Needs (05/11/2023)  Utilities: Not At Risk (05/11/2023)  Depression (PHQ2-9): High Risk (04/29/2023)  Social Connections: Unknown (05/11/2023)  Tobacco Use: High Risk (05/13/2023)   SDOH Interventions:     Readmission Risk Interventions     No data to display

## 2023-05-13 NOTE — Anesthesia Procedure Notes (Addendum)
Anesthesia Regional Block: Adductor canal block   Pre-Anesthetic Checklist: , timeout performed,  Correct Patient, Correct Site, Correct Laterality,  Correct Procedure, Correct Position, site marked,  Risks and benefits discussed,  Surgical consent,  Pre-op evaluation,  At surgeon's request and post-op pain management  Laterality: Left  Prep: chloraprep       Needles:  Injection technique: Single-shot  Needle Type: Stimiplex     Needle Length: 9cm  Needle Gauge: 22     Additional Needles:   Procedures:,,,, ultrasound used (permanent image in chart),,    Narrative:  Start time: 05/13/2023 10:21 AM End time: 05/13/2023 10:25 AM Injection made incrementally with aspirations every 5 mL.  Performed by: Personally  Anesthesiologist: Foye Deer, MD  Additional Notes: Patient consented for risk and benefits of nerve block including but not limited to nerve damage, failed block, bleeding and infection.  Patient voiced understanding.  Functioning IV was confirmed and monitors were applied.  Timeout done prior to procedure and prior to any sedation being given to the patient.  Patient confirmed procedure site prior to any sedation given to the patient. Sterile prep,hand hygiene and sterile gloves were used.  Minimal sedation used for procedure.  No paresthesia endorsed by patient during the procedure.  Negative aspiration and negative test dose prior to incremental administration of local anesthetic. The patient tolerated the procedure well with no immediate complications.

## 2023-05-13 NOTE — Transfer of Care (Signed)
Immediate Anesthesia Transfer of Care Note  Patient: Jackie Macias  Procedure(s) Performed: AMPUTATION BELOW KNEE (Left: Knee)  Patient Location: PACU  Anesthesia Type:General  Level of Consciousness: drowsy  Airway & Oxygen Therapy: Patient Spontanous Breathing and Patient connected to nasal cannula oxygen  Post-op Assessment: Report given to RN and Post -op Vital signs reviewed and stable  Post vital signs: Reviewed and stable  Last Vitals:  Vitals Value Taken Time  BP 89/48 05/13/23 1245  Temp    Pulse 78   Resp 14 05/13/23 1247  SpO2 91   Vitals shown include unfiled device data.  Last Pain:  Vitals:   05/13/23 1018  TempSrc:   PainSc: 0-No pain      Patients Stated Pain Goal: 0 (05/13/23 0546)  Complications: No notable events documented.

## 2023-05-13 NOTE — Progress Notes (Signed)
Pharmacy Antibiotic Note  Jackie Macias is a 68 y.o. female admitted on 05/10/2023 with  Osteomyelitis and left foot gangrene . She endorses purulent drainage. PMH includes PVD and prior toe amputations. She has chronic left foot wound which has has reportedly been worseing the past 3 weeks. Pharmacy has been consulted for Vancomycin and Zosyn dosing.   Plan: Day #2 of antibiotics Continue vancomycin 1250 mg IV q24H eAUC 459, Cmax 35, Cmin 10 Scr 0.8, IBW, Vd 0.72 L/kg Will order vanc peak and trough with 2/15 dose (4th maintenance dose) Continue Zosyn 3.375 g IV q8H Continue to monitor renal function to assess for necessary dose adjustments Follow up antibiotic plans after surgery    Height: 5\' 6"  (167.6 cm) Weight: 66.7 kg (147 lb) IBW/kg (Calculated) : 59.3  Temp (24hrs), Avg:98.2 F (36.8 C), Min:97.6 F (36.4 C), Max:98.5 F (36.9 C)  Recent Labs  Lab 05/10/23 1624 05/10/23 2050 05/11/23 0440 05/12/23 0505 05/13/23 0510  WBC 9.3  --  6.7 5.8 7.4  CREATININE 0.92  --  0.67 0.65 0.80  LATICACIDVEN 2.3* 1.9  --   --   --     Estimated Creatinine Clearance: 63 mL/min (by C-G formula based on SCr of 0.8 mg/dL).    Allergies  Allergen Reactions   Lisinopril-Hydrochlorothiazide Other (See Comments)    Antimicrobials this admission: Vancomycin 2/11 >>   Zosyn 2/11 >>    Dose adjustments this admission: N/A  Microbiology results: 2/11 BCx: NGTD  Thank you for allowing pharmacy to be a part of this patient's care.  Paulita Fujita, PharmD Clinical Pharmacist 05/13/2023 12:53 PM

## 2023-05-13 NOTE — Telephone Encounter (Signed)
Patient's family was not in the waiting room after surgery.  I was asked to call the daughter.  I did place a call to Mrs. Harrelson but went to [employer].  I did leave a brief message saying the surgery went well.

## 2023-05-13 NOTE — Plan of Care (Signed)
Problem: Education: Goal: Knowledge of General Education information will improve Description Including pain rating scale, medication(s)/side effects and non-pharmacologic comfort measures Outcome: Progressing   Problem: Health Behavior/Discharge Planning: Goal: Ability to manage health-related needs will improve Outcome: Progressing

## 2023-05-14 DIAGNOSIS — I96 Gangrene, not elsewhere classified: Secondary | ICD-10-CM | POA: Diagnosis not present

## 2023-05-14 DIAGNOSIS — M869 Osteomyelitis, unspecified: Secondary | ICD-10-CM | POA: Diagnosis not present

## 2023-05-14 LAB — BASIC METABOLIC PANEL
Anion gap: 4 — ABNORMAL LOW (ref 5–15)
BUN: 11 mg/dL (ref 8–23)
CO2: 25 mmol/L (ref 22–32)
Calcium: 8.6 mg/dL — ABNORMAL LOW (ref 8.9–10.3)
Chloride: 105 mmol/L (ref 98–111)
Creatinine, Ser: 0.67 mg/dL (ref 0.44–1.00)
GFR, Estimated: >60 mL/min (ref >60)
Glucose, Bld: 135 mg/dL — ABNORMAL HIGH (ref 70–99)
Potassium: 4.1 mmol/L (ref 3.5–5.1)
Sodium: 134 mmol/L — ABNORMAL LOW (ref 135–145)

## 2023-05-14 LAB — CBC WITH DIFFERENTIAL/PLATELET
Abs Immature Granulocytes: 0.03 10*3/uL (ref 0.00–0.07)
Basophils Absolute: 0 10*3/uL (ref 0.0–0.1)
Basophils Relative: 0 %
Eosinophils Absolute: 0 10*3/uL (ref 0.0–0.5)
Eosinophils Relative: 1 %
HCT: 22.6 % — ABNORMAL LOW (ref 36.0–46.0)
Hemoglobin: 7.3 g/dL — ABNORMAL LOW (ref 12.0–15.0)
Immature Granulocytes: 0 %
Lymphocytes Relative: 21 %
Lymphs Abs: 1.6 10*3/uL (ref 0.7–4.0)
MCH: 30.5 pg (ref 26.0–34.0)
MCHC: 32.3 g/dL (ref 30.0–36.0)
MCV: 94.6 fL (ref 80.0–100.0)
Monocytes Absolute: 0.9 10*3/uL (ref 0.1–1.0)
Monocytes Relative: 12 %
Neutro Abs: 5 10*3/uL (ref 1.7–7.7)
Neutrophils Relative %: 66 %
Platelets: 245 10*3/uL (ref 150–400)
RBC: 2.39 MIL/uL — ABNORMAL LOW (ref 3.87–5.11)
RDW: 14.7 % (ref 11.5–15.5)
WBC: 7.5 10*3/uL (ref 4.0–10.5)
nRBC: 0 % (ref 0.0–0.2)

## 2023-05-14 NOTE — Progress Notes (Signed)
Progress Note   Patient: Jackie Macias NFA:[id number] DOB: [birth year] DOA: 05/10/2023     4 DOS: the patient was seen and examined on 05/14/2023      Brief hospital course:  Jackie Macias is a 68 y.o. female with medical history significant of chronic left foot wound,  PVD, HTN, HLD, COPD, depression, gout, anemia, tobacco abuse, who presents with left foot pain and wound.  Pt has chronic left foot wound, which has been progressively worsening the past 3 weeks. She has at home wound care of the past few weeks but wasn't getting better.  Pt was seen by Dr. Ether Griffins of podiatry on the day of presentation and found that pt has entire foot gangrene and sent to hospital for further evaluation and treatment.  Dr. Ether Griffins recommends left foot amputation.    Imaging showing findings suspicious for osteomyelitis of the left fourth     Assessment and Plan:    Foot osteomyelitis, left (HCC):  I have reviewed patient's x-ray of the fourth showing findings suspicious for osteomyelitis involving the great toe of the left foot Continue empiric antimicrobial treatment with vancomycin and zosyn Continue as needed Zofran Patient underwent a left below-knee amputation by vascular surgery on 05/13/2023 Continue as needed pain medication    Anemia likely secondary to postoperative anemia We will continue to monitor CBC closely and transfuse for hemoglobin less than 7 Patient denies any active bleeding  Peripheral vascular disease (HCC): Pt has history of ischemic left foot ulcer.  Had a CT angio done in December that showed solitary vessel runoff via reconstituted treating her [name] artery, left-sided dorsalis pedis artery is not identified.  No distal embolization noted.  She had a vascular ultrasound done on 21 January that showed left ABI is within normal limit.  Left toe brachial index is abnormal.  -Hold plavix Continue aspirin and statin therapy   Essential hypertension: Bp is 177/76 -IV hydralazine as  needed  -Continue to hold Lasix Continue amlodipine 5 mg daily   HLD (hyperlipidemia) -Continue Crestor   Chronic obstructive pulmonary disease (COPD) (HCC): stable -Continue bronchodilators and as needed Mucinex   Iron deficiency anemia: Hemoglobin stable 11.6 (8.1 on 03/13/2023) Continue iron supplement   Depression Continue home medications   Tobacco use disorder Continue nicotine patch   DVT ppx: SQ Heparin       Code Status: Full code    Family Communication:    Yes, patient's niece    at bed side.        Disposition Plan:  Anticipate discharge back to previous environment     Subjective:  Patient denied any acute complaints Denies nausea vomiting abdominal pain chest pain   Physical Exam:   General: Not in acute distress.  Dry mucous membrane   Heme: No neck lymph node enlargement. Cardiac: S1/S2, RRR, No murmurs, No gallops or rubs. Respiratory: No rales, wheezing, rhonchi or rubs. GI: Soft, nondistended, nontender, no rebound pain, no organomegaly, BS present. GU: No hematuria Ext: No pitting leg edema bilaterally.  Left below-knee amputation with dressing that is clean and dry   Data Reviewed:        Latest Ref Rng & Units 05/14/2023    8:42 AM 05/13/2023    5:10 AM 05/12/2023    5:05 AM  BMP  Glucose 70 - 99 mg/dL 469  629  528   BUN 8 - 23 mg/dL 11  12  11    Creatinine 0.44 - 1.00 mg/dL 4.13  2.44  0.65   Sodium 135 - 145 mmol/L 134  135  134   Potassium 3.5 - 5.1 mmol/L 4.1  4.5  3.4   Chloride 98 - 111 mmol/L 105  106  104   CO2 22 - 32 mmol/L 25  20  24    Calcium 8.9 - 10.3 mg/dL 8.6  8.7  8.4       Vitals:   05/14/23 0435 05/14/23 0823 05/14/23 1127 05/14/23 1600  BP: (!) 154/68 (!) 120/59 (!) 152/58 (!) 156/61  Pulse: 82 77 76 83  Resp: 16 16 18 16   Temp: 97.8 F (36.6 C) 99.1 F (37.3 C) 98.3 F (36.8 C) 98.8 F (37.1 C)  TempSrc: Oral Oral Oral   SpO2: 99% 100% 94% 99%  Weight:      Height:          Latest Ref Rng & Units  05/14/2023    8:42 AM 05/13/2023    5:10 AM 05/12/2023    5:05 AM  CBC  WBC 4.0 - 10.5 K/uL 7.5  7.4  5.8   Hemoglobin 12.0 - 15.0 g/dL 7.3  9.3  8.8   Hematocrit 36.0 - 46.0 % 22.6  29.2  27.1   Platelets 150 - 400 K/uL 245  266  297      Author: Loyce Dys, MD 05/14/2023 4:57 PM  For on call review [email or web address].

## 2023-05-14 NOTE — Progress Notes (Addendum)
[facility]  Daily Progress Note   Subjective  -   No complaints.  No R leg or foot complaints.  Left BKA with ild soreness.  Cannot completely straighten knee.  I worked with her on this.  Dressing intact  Objective Vitals:   05/13/23 2330 05/14/23 0435 05/14/23 0823 05/14/23 1127  BP: (!) 121/58 (!) 154/68 (!) 120/59 (!) 152/58  Pulse: 76 82 77 76  Resp: 18 16 16 18   Temp: 97.7 F (36.5 C) 97.8 F (36.6 C) 99.1 F (37.3 C) 98.3 F (36.8 C)  TempSrc: Oral Oral Oral Oral  SpO2: 100% 99% 100% 94%  Weight:      Height:        Intake/Output Summary (Last 24 hours) at 05/14/2023 1310 Last data filed at 05/14/2023 0900 Gross per 24 hour  Intake 770 ml  Output 150 ml  Net 620 ml    PULM  CTAB CV  RRR VASC  Right warm foot.  Clean amputation BKA dressing on left.    Laboratory CBC    Component Value Date/Time   WBC 7.5 05/14/2023 0842   HGB 7.3 (L) 05/14/2023 0842   HCT 22.6 (L) 05/14/2023 0842   PLT 245 05/14/2023 0842    BMET    Component Value Date/Time   NA 134 (L) 05/14/2023 0842   NA 138 11/22/2022 1042   K 4.1 05/14/2023 0842   CL 105 05/14/2023 0842   CO2 25 05/14/2023 0842   GLUCOSE 135 (H) 05/14/2023 0842   BUN 11 05/14/2023 0842   BUN 18 11/22/2022 1042   CREATININE 0.67 05/14/2023 0842   CALCIUM 8.6 (L) 05/14/2023 0842   GFRNONAA >60 05/14/2023 0842   GFRAA >60 09/15/2018 1742    Assessment/Planning: POD #3 s/p L BKA.  Labs today look fine.  No new issues. PT to work on left knee flexion contracture  Iline Oven  05/14/2023, 1:10 PM

## 2023-05-14 NOTE — Plan of Care (Signed)

## 2023-05-14 NOTE — Plan of Care (Signed)
  Problem: Education: Goal: Knowledge of General Education information will improve Description: Including pain rating scale, medication(s)/side effects and non-pharmacologic comfort measures 05/14/2023 0446 by Gwenlyn Saran, RN Outcome: Progressing 05/14/2023 0437 by Gwenlyn Saran, RN Outcome: Progressing   Problem: Health Behavior/Discharge Planning: Goal: Ability to manage health-related needs will improve 05/14/2023 0446 by Gwenlyn Saran, RN Outcome: Progressing 05/14/2023 0437 by Gwenlyn Saran, RN Outcome: Progressing   Problem: Clinical Measurements: Goal: Ability to maintain clinical measurements within normal limits will improve 05/14/2023 0446 by Gwenlyn Saran, RN Outcome: Progressing 05/14/2023 0437 by Gwenlyn Saran, RN Outcome: Progressing Goal: Will remain free from infection 05/14/2023 0446 by Gwenlyn Saran, RN Outcome: Progressing 05/14/2023 0437 by Gwenlyn Saran, RN Outcome: Progressing Goal: Diagnostic test results will improve 05/14/2023 0446 by Gwenlyn Saran, RN Outcome: Progressing 05/14/2023 0437 by Gwenlyn Saran, RN Outcome: Progressing Goal: Respiratory complications will improve 05/14/2023 0446 by Gwenlyn Saran, RN Outcome: Progressing 05/14/2023 0437 by Gwenlyn Saran, RN Outcome: Progressing Goal: Cardiovascular complication will be avoided 05/14/2023 0446 by Gwenlyn Saran, RN Outcome: Progressing 05/14/2023 0437 by Gwenlyn Saran, RN Outcome: Progressing   Problem: Activity: Goal: Risk for activity intolerance will decrease 05/14/2023 0446 by Gwenlyn Saran, RN Outcome: Progressing 05/14/2023 0437 by Gwenlyn Saran, RN Outcome: Progressing   Problem: Nutrition: Goal: Adequate nutrition will be maintained 05/14/2023 0446 by Gwenlyn Saran, RN Outcome: Progressing 05/14/2023 0437 by Gwenlyn Saran, RN Outcome:  Progressing   Problem: Coping: Goal: Level of anxiety will decrease 05/14/2023 0446 by Gwenlyn Saran, RN Outcome: Progressing 05/14/2023 0437 by Gwenlyn Saran, RN Outcome: Progressing   Problem: Elimination: Goal: Will not experience complications related to bowel motility 05/14/2023 0446 by Gwenlyn Saran, RN Outcome: Progressing 05/14/2023 0437 by Gwenlyn Saran, RN Outcome: Progressing Goal: Will not experience complications related to urinary retention 05/14/2023 0446 by Gwenlyn Saran, RN Outcome: Progressing 05/14/2023 0437 by Gwenlyn Saran, RN Outcome: Progressing   Problem: Pain Managment: Goal: General experience of comfort will improve and/or be controlled 05/14/2023 0446 by Gwenlyn Saran, RN Outcome: Progressing 05/14/2023 0437 by Gwenlyn Saran, RN Outcome: Progressing   Problem: Safety: Goal: Ability to remain free from injury will improve 05/14/2023 0446 by Gwenlyn Saran, RN Outcome: Progressing 05/14/2023 0437 by Gwenlyn Saran, RN Outcome: Progressing   Problem: Skin Integrity: Goal: Risk for impaired skin integrity will decrease 05/14/2023 0446 by Gwenlyn Saran, RN Outcome: Progressing 05/14/2023 0437 by Gwenlyn Saran, RN Outcome: Progressing   Problem: Clinical Measurements: Goal: Ability to avoid or minimize complications of infection will improve 05/14/2023 0446 by Gwenlyn Saran, RN Outcome: Progressing 05/14/2023 0437 by Gwenlyn Saran, RN Outcome: Progressing   Problem: Skin Integrity: Goal: Skin integrity will improve 05/14/2023 0446 by Gwenlyn Saran, RN Outcome: Progressing 05/14/2023 0437 by Gwenlyn Saran, RN Outcome: Progressing

## 2023-05-15 DIAGNOSIS — L089 Local infection of the skin and subcutaneous tissue, unspecified: Secondary | ICD-10-CM | POA: Diagnosis not present

## 2023-05-15 DIAGNOSIS — M869 Osteomyelitis, unspecified: Secondary | ICD-10-CM | POA: Diagnosis not present

## 2023-05-15 DIAGNOSIS — I96 Gangrene, not elsewhere classified: Secondary | ICD-10-CM | POA: Diagnosis not present

## 2023-05-15 LAB — BASIC METABOLIC PANEL
Anion gap: 9 (ref 5–15)
BUN: 11 mg/dL (ref 8–23)
CO2: 24 mmol/L (ref 22–32)
Calcium: 8.4 mg/dL — ABNORMAL LOW (ref 8.9–10.3)
Chloride: 98 mmol/L (ref 98–111)
Creatinine, Ser: 0.66 mg/dL (ref 0.44–1.00)
GFR, Estimated: >60 mL/min (ref >60)
Glucose, Bld: 133 mg/dL — ABNORMAL HIGH (ref 70–99)
Potassium: 3.8 mmol/L (ref 3.5–5.1)
Sodium: 131 mmol/L — ABNORMAL LOW (ref 135–145)

## 2023-05-15 LAB — CBC WITH DIFFERENTIAL/PLATELET
Abs Immature Granulocytes: 0.05 10*3/uL (ref 0.00–0.07)
Basophils Absolute: 0 10*3/uL (ref 0.0–0.1)
Basophils Relative: 0 %
Eosinophils Absolute: 0.1 10*3/uL (ref 0.0–0.5)
Eosinophils Relative: 1 %
HCT: 22.4 % — ABNORMAL LOW (ref 36.0–46.0)
Hemoglobin: 7.5 g/dL — ABNORMAL LOW (ref 12.0–15.0)
Immature Granulocytes: 1 %
Lymphocytes Relative: 21 %
Lymphs Abs: 1.9 10*3/uL (ref 0.7–4.0)
MCH: 31.3 pg (ref 26.0–34.0)
MCHC: 33.5 g/dL (ref 30.0–36.0)
MCV: 93.3 fL (ref 80.0–100.0)
Monocytes Absolute: 1.2 10*3/uL — ABNORMAL HIGH (ref 0.1–1.0)
Monocytes Relative: 13 %
Neutro Abs: 5.9 10*3/uL (ref 1.7–7.7)
Neutrophils Relative %: 64 %
Platelets: 256 10*3/uL (ref 150–400)
RBC: 2.4 MIL/uL — ABNORMAL LOW (ref 3.87–5.11)
RDW: 14.7 % (ref 11.5–15.5)
WBC: 9.1 10*3/uL (ref 4.0–10.5)
nRBC: 0 % (ref 0.0–0.2)

## 2023-05-15 LAB — CULTURE, BLOOD (ROUTINE X 2)
Culture: NO GROWTH
Culture: NO GROWTH
Special Requests: ADEQUATE

## 2023-05-15 LAB — VANCOMYCIN, PEAK: Vancomycin Pk: 21 ug/mL — ABNORMAL LOW (ref 30–40)

## 2023-05-15 LAB — VANCOMYCIN, TROUGH: Vancomycin Tr: 8 ug/mL — ABNORMAL LOW (ref 15–20)

## 2023-05-15 MED ORDER — CLOPIDOGREL BISULFATE 75 MG PO TABS
75.0000 mg | ORAL_TABLET | Freq: Every day | ORAL | Status: DC
  Administered 2023-05-16 – 2023-05-19 (×4): 75 mg via ORAL
  Filled 2023-05-15 (×4): qty 1

## 2023-05-15 MED ORDER — VANCOMYCIN HCL 1750 MG/350ML IV SOLN
1750.0000 mg | INTRAVENOUS | Status: DC
  Administered 2023-05-15 – 2023-05-16 (×2): 1750 mg via INTRAVENOUS
  Filled 2023-05-15 (×2): qty 350

## 2023-05-15 NOTE — Plan of Care (Signed)

## 2023-05-15 NOTE — Evaluation (Signed)
Physical Therapy Evaluation Patient Details Name: Jackie Macias MRN: [id number] DOB: February 01, 1956 Today's Date: 05/15/2023  History of Present Illness  andra ARACELIA BRINSON is a 68 y.o. female with medical history significant of chronic left foot wound,  PVD, HTN, HLD, COPD, depression, gout, anemia, tobacco abuse, who presents with left foot pain and wound.  S/P L BKA   Clinical Impression  Patient received in bed, daughter and son in law at bedside. Patient is alert, joking throughout session. No pain reported. Received pain meds prior to session. She is able to perform bed mobility with mod independence and sat edge of bed several minutes. Performed LAQ at edge of bed with cues. She will continue to benefit from skilled PT to improve functional independence.         If plan is discharge home, recommend the following: A lot of help with walking and/or transfers;A little help with bathing/dressing/bathroom;Assist for transportation   Can travel by private vehicle   No    Equipment Recommendations Other (comment) (TBD)  Recommendations for Other Services       Functional Status Assessment Patient has had a recent decline in their functional status and demonstrates the ability to make significant improvements in function in a reasonable and predictable amount of time.     Precautions / Restrictions Precautions Precautions: Fall Recall of Precautions/Restrictions: Intact Restrictions Weight Bearing Restrictions Per Provider Order: No      Mobility  Bed Mobility Overal bed mobility: Modified Independent             General bed mobility comments: increased time and effort, but no physical assist provided.    Transfers                   General transfer comment: patient declined this session    Ambulation/Gait               General Gait Details: patient declined  Stairs            Wheelchair Mobility     Tilt Bed    Modified Rankin (Stroke  Patients Only)       Balance Overall balance assessment: Needs assistance Sitting-balance support: Feet supported Sitting balance-Leahy Scale: Good Sitting balance - Comments: able to sit edge of bed without support.       Standing balance comment: NT                             Pertinent Vitals/Pain Pain Assessment Pain Assessment: No/denies pain    Home Living Family/patient expects to be discharged to:: Skilled nursing facility Living Arrangements: Spouse/significant other (states she lives with a man, but he doesn't help her)                      Prior Function Prior Level of Function : Independent/Modified Independent             Mobility Comments: pt reports amb with no AD, does not endorse falls- (chart states frequent falls) ADLs Comments: independent     Extremity/Trunk Assessment   Upper Extremity Assessment Upper Extremity Assessment: Defer to OT evaluation    Lower Extremity Assessment Lower Extremity Assessment: Generalized weakness    Cervical / Trunk Assessment Cervical / Trunk Assessment: Normal  Communication   Communication Communication: No apparent difficulties    Cognition Arousal: Alert Behavior During Therapy: WFL for tasks assessed/performed   PT - Cognitive impairments: No  apparent impairments                         Following commands: Intact       Cueing Cueing Techniques: Verbal cues     General Comments      Exercises Other Exercises Other Exercises: seated LAQ at edge of bed to promote knee extention   Assessment/Plan    PT Assessment Patient needs continued PT services  PT Problem List Decreased strength;Decreased activity tolerance;Decreased balance;Decreased mobility;Decreased knowledge of use of DME;Decreased safety awareness;Decreased knowledge of precautions;Decreased range of motion       PT Treatment Interventions DME instruction;Gait training;Functional mobility  training;Therapeutic activities;Therapeutic exercise;Balance training;Neuromuscular re-education;Patient/family education;Wheelchair mobility training    PT Goals (Current goals can be found in the Care Plan section)  Acute Rehab PT Goals Patient Stated Goal: to get more therapy PT Goal Formulation: With patient/family Time For Goal Achievement: 05/27/23 Potential to Achieve Goals: Good    Frequency 7X/week     Co-evaluation               AM-PAC PT "6 Clicks" Mobility  Outcome Measure Help needed turning from your back to your side while in a flat bed without using bedrails?: A Little Help needed moving from lying on your back to sitting on the side of a flat bed without using bedrails?: A Little Help needed moving to and from a bed to a chair (including a wheelchair)?: A Lot Help needed standing up from a chair using your arms (e.g., wheelchair or bedside chair)?: A Lot Help needed to walk in hospital room?: A Lot Help needed climbing 3-5 steps with a railing? : Total 6 Click Score: 13    End of Session   Activity Tolerance: Patient tolerated treatment well Patient left: in bed;with call bell/phone within reach;with family/visitor present;with bed alarm set Nurse Communication: Mobility status PT Visit Diagnosis: Other abnormalities of gait and mobility (R26.89);Muscle weakness (generalized) (M62.81);Difficulty in walking, not elsewhere classified (R26.2);History of falling (Z91.81)    Time: 1610-9604 PT Time Calculation (min) (ACUTE ONLY): 26 min   Charges:   PT Evaluation $PT Eval Moderate Complexity: 1 Mod PT Treatments $Therapeutic Activity: 8-22 mins PT General Charges $$ ACUTE PT VISIT: 1 Visit         Addam Goeller, PT, GCS 05/15/23,1:13 PM

## 2023-05-15 NOTE — Progress Notes (Signed)
[facility]  Daily Progress Note   Subjective  - No complaints today.  Minimal discomfort from stump.  POD#3 left BKA  Objective Vitals:   05/14/23 1600 05/14/23 2016 05/15/23 0427 05/15/23 0830  BP: (!) 156/61 (!) 169/56 (!) 148/64 (!) 112/51  Pulse: 83 83 80 69  Resp: 16 16 20 16   Temp: 98.8 F (37.1 C) 100.1 F (37.8 C) 98.4 F (36.9 C) 98.7 F (37.1 C)  TempSrc:  Oral Oral Oral  SpO2: 99% 90% 100% 97%  Weight:      Height:        Intake/Output Summary (Last 24 hours) at 05/15/2023 1353 Last data filed at 05/14/2023 2000 Gross per 24 hour  Intake 1428.49 ml  Output 800 ml  Net 628.49 ml    PULM  CTAB CV  RRR VASC  Stump dressing clean and dry.  Able to with assist fully extend with no pain.  Right heel floated with Mepilex in place.  Laboratory CBC    Component Value Date/Time   WBC 9.1 05/15/2023 0032   HGB 7.5 (L) 05/15/2023 0032   HCT 22.4 (L) 05/15/2023 0032   PLT 256 05/15/2023 0032    BMET    Component Value Date/Time   NA 131 (L) 05/15/2023 0032   NA 138 11/22/2022 1042   K 3.8 05/15/2023 0032   CL 98 05/15/2023 0032   CO2 24 05/15/2023 0032   GLUCOSE 133 (H) 05/15/2023 0032   BUN 11 05/15/2023 0032   BUN 18 11/22/2022 1042   CREATININE 0.66 05/15/2023 0032   CALCIUM 8.4 (L) 05/15/2023 0032   GFRNONAA >60 05/15/2023 0032   GFRAA >60 09/15/2018 1742    Assessment/Planning: POD #3 s/p L BKA  Doing very well.  Can probably stop the IV and IV antibiotics if they were just for the left leg.  Dressing change tomorrow for BKA.   Iline Oven  05/15/2023, 1:53 PM

## 2023-05-15 NOTE — Anesthesia Postprocedure Evaluation (Signed)
Anesthesia Post Note  Patient: Jackie Macias  Procedure(s) Performed: AMPUTATION BELOW KNEE (Left: Knee)  Patient location during evaluation: PACU Anesthesia Type: General Level of consciousness: awake and alert Pain management: pain level controlled Vital Signs Assessment: post-procedure vital signs reviewed and stable Respiratory status: spontaneous breathing, nonlabored ventilation and respiratory function stable Cardiovascular status: blood pressure returned to baseline and stable Postop Assessment: no apparent nausea or vomiting Anesthetic complications: no   No notable events documented.   Last Vitals:  Vitals:   05/14/23 2016 05/15/23 0427  BP: (!) 169/56 (!) 148/64  Pulse: 83 80  Resp: 16 20  Temp: 37.8 C 36.9 C  SpO2: 90% 100%    Last Pain:  Vitals:   05/15/23 0526  TempSrc:   PainSc: Asleep                 Foye Deer

## 2023-05-15 NOTE — Progress Notes (Signed)
Progress Note   Patient: Jackie Macias NWG:[id number] DOB: Dec 21, 1955 DOA: 05/10/2023     5 DOS: the patient was seen and examined on 05/15/2023      Brief hospital course:  CHRISIE JANKOVICH is a 68 y.o. female with medical history significant of chronic left foot wound,  PVD, HTN, HLD, COPD, depression, gout, anemia, tobacco abuse, who presents with left foot pain and wound.  Pt has chronic left foot wound, which has been progressively worsening the past 3 weeks. She has at home wound care of the past few weeks but wasn't getting better.  Pt was seen by Dr. Ether Griffins of podiatry on the day of presentation and found that pt has entire foot gangrene and sent to hospital for further evaluation and treatment.  Dr. Ether Griffins recommends left foot amputation.    Imaging showing findings suspicious for osteomyelitis of the left fourth     Assessment and Plan:    Foot osteomyelitis, left (HCC):  I have reviewed patient's x-ray of the fourth showing findings suspicious for osteomyelitis involving the great toe of the left foot Continue empiric antimicrobial treatment with vancomycin and zosyn Continue as needed Zofran Patient underwent a left below-knee amputation by vascular surgery on 05/13/2023 Continue as needed pain medication     Anemia likely secondary to postoperative anemia We will continue to monitor CBC closely and transfuse for hemoglobin less than 7 Patient denies any active bleeding   Peripheral vascular disease (HCC): Pt has history of ischemic left foot ulcer.  Had a CT angio done in December that showed solitary vessel runoff via reconstituted treating her [name] artery, left-sided dorsalis pedis artery is not identified.  No distal embolization noted.  She had a vascular ultrasound done on 21 January that showed left ABI is within normal limit.  Left toe brachial index is abnormal.  Presumed Plavix Continue aspirin and statin therapy   Essential hypertension: Bp is 177/76 -IV  hydralazine as needed  -Continue to hold Lasix Continue amlodipine 5 mg daily   HLD (hyperlipidemia) -Continue Crestor   Chronic obstructive pulmonary disease (COPD) (HCC): stable -Continue bronchodilators and as needed Mucinex   Iron deficiency anemia: Hemoglobin stable 11.6 (8.1 on 03/13/2023) Continue iron supplement   Depression Continue home medications   Tobacco use disorder Continue nicotine patch   DVT ppx: SQ Heparin       Code Status: Full code    Family Communication:    Yes, patient's niece    at bed side.        Disposition Plan:  Anticipate discharge back to previous environment     Subjective:  Patient denied any acute complaints Denies any nausea vomiting chest pain   Physical Exam:   General: Not in acute distress.  Dry mucous membrane   Heme: No neck lymph node enlargement. Cardiac: S1/S2, RRR, No murmurs, No gallops or rubs. Respiratory: No rales, wheezing, rhonchi or rubs. GI: Soft, nondistended, nontender, no rebound pain, no organomegaly, BS present. GU: No hematuria Ext: No pitting leg edema bilaterally.  Left below-knee amputation with dressing that is clean and dry   Data Reviewed:      Latest Ref Rng & Units 05/15/2023   12:32 AM 05/14/2023    8:42 AM 05/13/2023    5:10 AM  BMP  Glucose 70 - 99 mg/dL 578  469  629   BUN 8 - 23 mg/dL 11  11  12    Creatinine 0.44 - 1.00 mg/dL 5.28  4.13  2.44  Sodium 135 - 145 mmol/L 131  134  135   Potassium 3.5 - 5.1 mmol/L 3.8  4.1  4.5   Chloride 98 - 111 mmol/L 98  105  106   CO2 22 - 32 mmol/L 24  25  20    Calcium 8.9 - 10.3 mg/dL 8.4  8.6  8.7       Vitals:   05/14/23 2016 05/15/23 0427 05/15/23 0830 05/15/23 1418  BP: (!) 169/56 (!) 148/64 (!) 112/51 (!) 119/51  Pulse: 83 80 69 78  Resp: 16 20 16 16   Temp: 100.1 F (37.8 C) 98.4 F (36.9 C) 98.7 F (37.1 C) 98.8 F (37.1 C)  TempSrc: Oral Oral Oral   SpO2: 90% 100% 97% 100%  Weight:      Height:          Latest Ref Rng & Units  05/15/2023   12:32 AM 05/14/2023    8:42 AM 05/13/2023    5:10 AM  CBC  WBC 4.0 - 10.5 K/uL 9.1  7.5  7.4   Hemoglobin 12.0 - 15.0 g/dL 7.5  7.3  9.3   Hematocrit 36.0 - 46.0 % 22.4  22.6  29.2   Platelets 150 - 400 K/uL 256  245  266      Author: Loyce Dys, MD 05/15/2023 4:40 PM  For on call review [email or web address].

## 2023-05-15 NOTE — Progress Notes (Signed)
Pharmacy Antibiotic Note  Jackie Macias is a 68 y.o. female admitted on 05/10/2023 with  Osteomyelitis and left foot gangrene . She endorses purulent drainage. PMH includes PVD and prior toe amputations. She has chronic left foot wound which has has reportedly been worseing the past 3 weeks. Pharmacy has been consulted for Vancomycin and Zosyn dosing.   Vancomycin PK Vancomycin dose (2/15 @ 21:33): 1250 mg Vancomycin peak (2/16 @ 00:32): 21.0 Vancomycin trough (2/16 @ 19:28): 8.0 Calculated AUC: 325.7 Calculated Cmin: 7.2   Plan: Day #4 of antibiotics Increase vancomycin to 1750 mg IV Q24H eAUC 455.7 Cmax 31.3 Cmin 10.0 Continue Zosyn 3.375 g IV q8H Continue to monitor renal function to assess for necessary dose adjustments Follow up antibiotic plans after surgery    Height: 5\' 6"  (167.6 cm) Weight: 66.7 kg (147 lb) IBW/kg (Calculated) : 59.3  Temp (24hrs), Avg:98.8 F (37.1 C), Min:98.4 F (36.9 C), Max:99.4 F (37.4 C)  Recent Labs  Lab 05/10/23 1624 05/10/23 2050 05/11/23 0440 05/12/23 0505 05/13/23 0510 05/14/23 0842 05/15/23 0032 05/15/23 1928  WBC 9.3  --  6.7 5.8 7.4 7.5 9.1  --   CREATININE 0.92  --  0.67 0.65 0.80 0.67 0.66  --   LATICACIDVEN 2.3* 1.9  --   --   --   --   --   --   VANCOTROUGH  --   --   --   --   --   --   --  8*  VANCOPEAK  --   --   --   --   --   --  21*  --     Estimated Creatinine Clearance: 63 mL/min (by C-G formula based on SCr of 0.66 mg/dL).    Allergies  Allergen Reactions   Lisinopril-Hydrochlorothiazide Other (See Comments)    Antimicrobials this admission: Vancomycin 2/11 >>   Zosyn 2/11 >>    Dose adjustments this admission: 2/16 Vancomycin 1250 mg IV Q24H >> Vancomycin 1750 mg IV Q24H   Microbiology results: 2/11 BCx: NG final  Thank you for allowing pharmacy to be a part of this patient's care.  Celene Squibb, PharmD Clinical Pharmacist 05/15/2023 9:33 PM

## 2023-05-15 NOTE — Plan of Care (Signed)

## 2023-05-16 ENCOUNTER — Encounter: Payer: Self-pay | Admitting: Vascular Surgery

## 2023-05-16 DIAGNOSIS — M869 Osteomyelitis, unspecified: Secondary | ICD-10-CM | POA: Diagnosis not present

## 2023-05-16 DIAGNOSIS — L089 Local infection of the skin and subcutaneous tissue, unspecified: Secondary | ICD-10-CM | POA: Diagnosis not present

## 2023-05-16 DIAGNOSIS — I96 Gangrene, not elsewhere classified: Secondary | ICD-10-CM | POA: Diagnosis not present

## 2023-05-16 LAB — CBC WITH DIFFERENTIAL/PLATELET
Abs Immature Granulocytes: 0.13 10*3/uL — ABNORMAL HIGH (ref 0.00–0.07)
Basophils Absolute: 0 10*3/uL (ref 0.0–0.1)
Basophils Relative: 0 %
Eosinophils Absolute: 0 10*3/uL (ref 0.0–0.5)
Eosinophils Relative: 0 %
HCT: 23.1 % — ABNORMAL LOW (ref 36.0–46.0)
Hemoglobin: 7.6 g/dL — ABNORMAL LOW (ref 12.0–15.0)
Immature Granulocytes: 1 %
Lymphocytes Relative: 11 %
Lymphs Abs: 2 10*3/uL (ref 0.7–4.0)
MCH: 30.6 pg (ref 26.0–34.0)
MCHC: 32.9 g/dL (ref 30.0–36.0)
MCV: 93.1 fL (ref 80.0–100.0)
Monocytes Absolute: 1.2 10*3/uL — ABNORMAL HIGH (ref 0.1–1.0)
Monocytes Relative: 7 %
Neutro Abs: 15 10*3/uL — ABNORMAL HIGH (ref 1.7–7.7)
Neutrophils Relative %: 81 %
Platelets: 309 10*3/uL (ref 150–400)
RBC: 2.48 MIL/uL — ABNORMAL LOW (ref 3.87–5.11)
RDW: 14.8 % (ref 11.5–15.5)
WBC: 18.4 10*3/uL — ABNORMAL HIGH (ref 4.0–10.5)
nRBC: 0 % (ref 0.0–0.2)

## 2023-05-16 LAB — BASIC METABOLIC PANEL
Anion gap: 7 (ref 5–15)
BUN: 12 mg/dL (ref 8–23)
CO2: 24 mmol/L (ref 22–32)
Calcium: 8.4 mg/dL — ABNORMAL LOW (ref 8.9–10.3)
Chloride: 103 mmol/L (ref 98–111)
Creatinine, Ser: 0.68 mg/dL (ref 0.44–1.00)
GFR, Estimated: >60 mL/min (ref >60)
Glucose, Bld: 112 mg/dL — ABNORMAL HIGH (ref 70–99)
Potassium: 3.6 mmol/L (ref 3.5–5.1)
Sodium: 134 mmol/L — ABNORMAL LOW (ref 135–145)

## 2023-05-16 LAB — SURGICAL PATHOLOGY

## 2023-05-16 MED ORDER — DOCUSATE SODIUM 100 MG PO CAPS
100.0000 mg | ORAL_CAPSULE | Freq: Two times a day (BID) | ORAL | Status: DC
  Administered 2023-05-16 – 2023-05-19 (×7): 100 mg via ORAL
  Filled 2023-05-16 (×7): qty 1

## 2023-05-16 NOTE — Progress Notes (Signed)
  Progress Note    05/16/2023 1:31 PM 3 Days Post-Op  Subjective:  Jackie Macias is a 68 yo female who is now POD #3 from left lower below the knee amputation. She is recovering as expected. Dressing remains clean dry and intact. She endorses soreness to her extremity but no pain.  No complaints overnight. Vitals all remain stable.    Vitals:   05/16/23 0333 05/16/23 0911  BP: (!) 134/46 (!) 138/58  Pulse: 85 78  Resp: 18 18  Temp: (!) 100.4 F (38 C) 98.6 F (37 C)  SpO2: 91% 98%   Physical Exam: Cardiac:  RRR, Normal S1, S2.  Lungs:  Clear on auscultation throughout. No rales rhonchi or wheezing. Non Labored breathing.  Incisions:  Left Stump. Staples intact. Dressing changed today at the bedside.  Extremities:  Right lower extremity with doppler DP/PT. Dressing to right heel. Left Lower extremity with ned BKA. Dressing changed today. No complications to note. Daily dressing changes needed.  Abdomen:  Positive bowel sounds throughout, soft non tender and non distended.  Neurologic: AAOX 4, answers all questions and follows commands appropriately.   CBC    Component Value Date/Time   WBC 18.4 (H) 05/16/2023 0623   RBC 2.48 (L) 05/16/2023 0623   HGB 7.6 (L) 05/16/2023 0623   HCT 23.1 (L) 05/16/2023 0623   PLT 309 05/16/2023 0623   MCV 93.1 05/16/2023 0623   MCH 30.6 05/16/2023 0623   MCHC 32.9 05/16/2023 0623   RDW 14.8 05/16/2023 0623   LYMPHSABS 2.0 05/16/2023 0623   MONOABS 1.2 (H) 05/16/2023 0623   EOSABS 0.0 05/16/2023 0623   BASOSABS 0.0 05/16/2023 0623    BMET    Component Value Date/Time   NA 134 (L) 05/16/2023 0623   NA 138 11/22/2022 1042   K 3.6 05/16/2023 0623   CL 103 05/16/2023 0623   CO2 24 05/16/2023 0623   GLUCOSE 112 (H) 05/16/2023 0623   BUN 12 05/16/2023 0623   BUN 18 11/22/2022 1042   CREATININE 0.68 05/16/2023 0623   CALCIUM 8.4 (L) 05/16/2023 0623   GFRNONAA >60 05/16/2023 0623   GFRAA >60 09/15/2018 1742    INR    Component  Value Date/Time   INR 1.2 05/11/2023 0200     Intake/Output Summary (Last 24 hours) at 05/16/2023 1331 Last data filed at 05/16/2023 0304 Gross per 24 hour  Intake 1297.08 ml  Output 500 ml  Net 797.08 ml     Assessment/Plan:  68 y.o. female is s/p Left Lower extremity BKA.  3 Days Post-Op   Plan Dressing changed today at bedside by Vascular surgery. Nursing to do daily dressing changes.   Dressing Changes: Remove old dressing Xeroform applied over staple line then 4X4 Guaze applied over xeroform guaze then Covered with 2 ADB Pads then Wrapped with Kerlex X 1 roll then Wrap Stump with 6 inch ace bandage Snugly to reduce swelling.  Dressing to be changed Daily.   Okay per Vascular Surgery for patient to discharge to Rehab when medically ready. Patient to follow up with vascular surgery in 3 weeks after discharge for staple removal.   DVT prophylaxis:  ASA 81 mg daily, Plavix 75 mg Daily, Heparin 5000 units SQ Q 8hrs.    [name] [facility] 05/16/2023 1:31 PM

## 2023-05-16 NOTE — Evaluation (Signed)
Occupational Therapy Evaluation Patient Details Name: Jackie Macias MRN: [id number] DOB: Jun 27, 1955 Today's Date: 05/16/2023   History of Present Illness   Jackie Macias is a 68 y.o. female with medical history significant of chronic left foot wound,  PVD, HTN, HLD, COPD, depression, gout, anemia, tobacco abuse, who presents with left foot pain and wound.  S/P L BKA     Clinical Impressions Pt in bed upon OT arrival and agreeable to OT eval.  Pt limited this date by pain, nausea, and constipation, but stated that she had already had meds this morning for pain.  OT provided ginger ale for nausea.  RN notified of med requests for bowels and nausea.  Pt made 3 good attempts at standing from elevated EOB, but ultimately only able to clear buttocks from bed briefly.  Would benefit from 2 person sit to stand at present, or scoot pivot attempt next session.  Pt verbalized phantom limb sensation; education provided for nerve re-education strategies (see below).  Currently performing ADLs from EOB sitting or bed level.  Pt will benefit from continued acute OT and d/c to SNF in order to maximize indep and safety with daily tasks d/t limited support at home.  See below for ADL status.  Pt left in bed at end of session with all necessary items within reach.       If plan is discharge home, recommend the following:   Two people to help with walking and/or transfers;A lot of help with bathing/dressing/bathroom;Assistance with cooking/housework;Assist for transportation;Help with stairs or ramp for entrance     Functional Status Assessment   Patient has had a recent decline in their functional status and demonstrates the ability to make significant improvements in function in a reasonable and predictable amount of time.     Equipment Recommendations   Other (comment) (defer to next venue of care)     Recommendations for Other Services         Precautions/Restrictions    Precautions Precautions: Fall Restrictions Weight Bearing Restrictions Per Provider Order: No     Mobility Bed Mobility Overal bed mobility: Modified Independent             General bed mobility comments: increased time and effort, but no physical assist provided; including scooting from middle toward HOB in sitting and supine Patient Response: Cooperative  Transfers Overall transfer level: Needs assistance Equipment used: Rolling walker (2 wheels) Transfers: Sit to/from Stand Sit to Stand: +2 physical assistance, From elevated surface           General transfer comment: Pt made 3 good attempts at sit to stand from elevated EOB, but ultimately cleared buttocks only and was not able to achieve any standing with 1 person assist.      Balance Overall balance assessment: Needs assistance Sitting-balance support: Feet supported Sitting balance-Leahy Scale: Good Sitting balance - Comments: Able to reach forward to don R foot sock without LOB       Standing balance comment: Attempted standing this date x3 but unable to achieve more than clearing buttocks on bed                           ADL either performed or assessed with clinical judgement   ADL Overall ADL's : Needs assistance/impaired Eating/Feeding: Independent                   Lower Body Dressing: Moderate assistance;Bed level Lower Body Dressing Details (  indicate cue type and reason): Pt able to don R foot sock sitting EOB with extra time and min guard d/t pt FOF; hiking pants would require 2 person assist in standing or 1 person at bed level via rolling and bridging     Toileting- Clothing Manipulation and Hygiene: Maximal assistance       Functional mobility during ADLs: Rolling walker (2 wheels) General ADL Comments: Bed level or chair level ADLs at this time     Vision Patient Visual Report: No change from baseline                         Pertinent Vitals/Pain Pain  Assessment Pain Assessment: 0-10 Pain Score: 9  Pain Location: L stump only with activity (no pain reported at rest upon OT entering) Pain Descriptors / Indicators: Aching, Sharp Pain Intervention(s): Limited activity within patient's tolerance, Monitored during session, Repositioned, Other (comment) (pt stated she had meds already)     Extremity/Trunk Assessment Upper Extremity Assessment Upper Extremity Assessment: Overall WFL for tasks assessed   Lower Extremity Assessment Lower Extremity Assessment: Generalized weakness;LLE deficits/detail LLE Deficits / Details: L BKA   Cervical / Trunk Assessment Cervical / Trunk Assessment: Normal   Communication Communication Communication: No apparent difficulties   Cognition Arousal: Alert Behavior During Therapy: WFL for tasks assessed/performed                                 Following commands: Intact       Cueing  General Comments   Cueing Techniques: Verbal cues      Exercises Other Exercises Other Exercises: Educ: neuro re-education for phamtom limb sensation; encouraged gentle tapping to end of stump; good tolerance   Shoulder Instructions      Home Living Family/patient expects to be discharged to:: Skilled nursing facility Living Arrangements: Spouse/significant other;Other (Comment) (roommate; no assist from roommate)                                      Prior Functioning/Environment Prior Level of Function : Independent/Modified Independent             Mobility Comments: Amb without AD ADLs Comments: modified indep    OT Problem List: Decreased strength;Decreased activity tolerance;Decreased knowledge of use of DME or AE;Impaired balance (sitting and/or standing);Decreased safety awareness;Decreased knowledge of precautions;Impaired sensation;Pain   OT Treatment/Interventions: Self-care/ADL training;Energy conservation;Balance training;Therapeutic exercise;DME and/or AE  instruction;Neuromuscular education;Therapeutic activities;Patient/family education      OT Goals(Current goals can be found in the care plan section)   Acute Rehab OT Goals Patient Stated Goal: D/c to short term rehab OT Goal Formulation: With patient Time For Goal Achievement: 05/30/23 Potential to Achieve Goals: Good ADL Goals Pt Will Perform Lower Body Dressing: with supervision;bed level Pt Will Transfer to Toilet: with mod assist;bedside commode Pt Will Perform Toileting - Clothing Manipulation and hygiene: with min assist;sitting/lateral leans   OT Frequency:  Min 1X/week                  AM-PAC OT "6 Clicks" Daily Activity     Outcome Measure Help from another person eating meals?: None Help from another person taking care of personal grooming?: A Little Help from another person toileting, which includes using toliet, bedpan, or urinal?: A Lot Help from another person bathing (  including washing, rinsing, drying)?: A Little Help from another person to put on and taking off regular upper body clothing?: None Help from another person to put on and taking off regular lower body clothing?: A Lot 6 Click Score: 18   End of Session Equipment Utilized During Treatment: Gait belt;Rolling walker (2 wheels) Nurse Communication: Other (comment) (notified RN of pt's reported nausea and constipation)  Activity Tolerance: Other (comment) (limited by pain and nausea) Patient left: in bed;with call bell/phone within reach;with bed alarm set  OT Visit Diagnosis: Other abnormalities of gait and mobility (R26.89);Muscle weakness (generalized) (M62.81);Pain Pain - Right/Left: Left Pain - part of body: Leg                Time: 1003-1041 OT Time Calculation (min): 38 min Charges:  OT General Charges $OT Visit: 1 Visit OT Evaluation $OT Eval Moderate Complexity: 1 Mod OT Treatments $Self Care/Home Management : 8-22 mins Danelle Earthly, MS, OTR/L [place] Dials 05/16/2023,  11:45 AM

## 2023-05-16 NOTE — Progress Notes (Signed)
Pt is AxO, VSS. UOP adequate. No acute changes through duration of Shift. Patient safety maintained. Pt shows no signs of distress at this time. No acute pain noted. Patient denies any ideations of suicidal thoughts. Education provided during care. No acute findings during shift. Pt c/o pain in R hip PRN pain med given

## 2023-05-16 NOTE — Progress Notes (Signed)
PT Cancellation Note  Patient Details Name: Jackie Macias MRN: [id number] DOB: [birth year]   Cancelled Treatment:     Therapist in to see pt earlier who adamantly refused any activity. Will re-attempt next available date/time per POC.    Jannet Askew 05/16/2023, 5:30 PM

## 2023-05-16 NOTE — Progress Notes (Signed)
Progress Note   Patient: Jackie Macias ZOX:[id number] DOB: Sep 25, 1955 DOA: 05/10/2023     6 DOS: the patient was seen and examined on 05/16/2023     Brief hospital course:  Jackie Macias is a 68 y.o. female with medical history significant of chronic left foot wound,  PVD, HTN, HLD, COPD, depression, gout, anemia, tobacco abuse, who presents with left foot pain and wound.  Pt has chronic left foot wound, which has been progressively worsening the past 3 weeks. She has at home wound care of the past few weeks but wasn't getting better.  Pt was seen by Dr. Ether Griffins of podiatry on the day of presentation and found that pt has entire foot gangrene and sent to hospital for further evaluation and treatment.  Dr. Ether Griffins recommends left foot amputation.    Imaging showing findings suspicious for osteomyelitis of the left foot.  Patient was seen by vascular surgeon and currently status post left below-knee amputation.     Assessment and Plan:    Foot osteomyelitis, left (HCC):  I have reviewed patient's x-ray of the foot showing findings suspicious for osteomyelitis involving the great toe of the left foot Continue empiric antimicrobial treatment with vancomycin and zosyn Continue as needed Zofran Patient underwent a left below-knee amputation by vascular surgery on 05/13/2023 Continue as needed pain medication Patient noted with leukocytosis and fever-we will continue current antibiotics     Anemia likely secondary to postoperative anemia We will continue to monitor CBC closely and transfuse for hemoglobin less than 7 Patient denies any active bleeding   Peripheral vascular disease (HCC): Pt has history of ischemic left foot ulcer.  Had a CT angio done in December that showed solitary vessel runoff via reconstituted treating her [name] artery, left-sided dorsalis pedis artery is not identified.  No distal embolization noted.  She had a vascular ultrasound done on 21 January that showed left ABI is  within normal limit.  Left toe brachial index is abnormal.  Presumed Plavix Continue aspirin and statin therapy   Essential hypertension: Bp is 177/76 -IV hydralazine as needed  -Continue to hold Lasix Continue amlodipine 5 mg daily   HLD (hyperlipidemia) -Continue Crestor   Chronic obstructive pulmonary disease (COPD) (HCC): stable -Continue bronchodilators and as needed Mucinex   Iron deficiency anemia: Hemoglobin stable 11.6 (8.1 on 03/13/2023) Continue iron supplement   Depression Continue home medications   Tobacco use disorder Continue nicotine patch   DVT ppx: SQ Heparin       Code Status: Full code    Family Communication:    Yes, patient's niece    at bed side.        Disposition Plan:  Anticipate discharge back to previous environment     Subjective:  Patient had temperature spike overnight with associated leukocytosis She is on broad-spectrum antibiotics She denied nausea vomiting abdominal pain chest pain This may be related to post operative pyrexia   Physical Exam:   General: Not in acute distress.  Dry mucous membrane   Heme: No neck lymph node enlargement. Cardiac: S1/S2, RRR, No murmurs, No gallops or rubs. Respiratory: No rales, wheezing, rhonchi or rubs. GI: Soft, nondistended, nontender, no rebound pain, no organomegaly, BS present. GU: No hematuria Ext: No pitting leg edema bilaterally.  Left below-knee amputation with dressing that is clean and dry   Data Reviewed:      Latest Ref Rng & Units 05/16/2023    6:23 AM 05/15/2023   12:32 AM 05/14/2023  8:42 AM  BMP  Glucose 70 - 99 mg/dL 782  956  213   BUN 8 - 23 mg/dL 12  11  11    Creatinine 0.44 - 1.00 mg/dL 0.86  5.78  4.69   Sodium 135 - 145 mmol/L 134  131  134   Potassium 3.5 - 5.1 mmol/L 3.6  3.8  4.1   Chloride 98 - 111 mmol/L 103  98  105   CO2 22 - 32 mmol/L 24  24  25    Calcium 8.9 - 10.3 mg/dL 8.4  8.4  8.6     Vitals:   05/15/23 1418 05/15/23 2000 05/16/23 0333 05/16/23  0911  BP: (!) 119/51 (!) 157/66 (!) 134/46 (!) 138/58  Pulse: 78 77 85 78  Resp: 16 18 18 18   Temp: 98.8 F (37.1 C) 99.4 F (37.4 C) (!) 100.4 F (38 C) 98.6 F (37 C)  TempSrc:  Oral Oral Oral  SpO2: 100%  91% 98%  Weight:      Height:          Latest Ref Rng & Units 05/16/2023    6:23 AM 05/15/2023   12:32 AM 05/14/2023    8:42 AM  CBC  WBC 4.0 - 10.5 K/uL 18.4  9.1  7.5   Hemoglobin 12.0 - 15.0 g/dL 7.6  7.5  7.3   Hematocrit 36.0 - 46.0 % 23.1  22.4  22.6   Platelets 150 - 400 K/uL 309  256  245      Author: Loyce Dys, MD 05/16/2023 5:00 PM  For on call review [email or web address].

## 2023-05-16 NOTE — Plan of Care (Signed)

## 2023-05-17 DIAGNOSIS — L089 Local infection of the skin and subcutaneous tissue, unspecified: Secondary | ICD-10-CM | POA: Diagnosis not present

## 2023-05-17 DIAGNOSIS — I96 Gangrene, not elsewhere classified: Secondary | ICD-10-CM | POA: Diagnosis not present

## 2023-05-17 DIAGNOSIS — M869 Osteomyelitis, unspecified: Secondary | ICD-10-CM | POA: Diagnosis not present

## 2023-05-17 LAB — BASIC METABOLIC PANEL
Anion gap: 8 (ref 5–15)
BUN: 13 mg/dL (ref 8–23)
CO2: 24 mmol/L (ref 22–32)
Calcium: 8.2 mg/dL — ABNORMAL LOW (ref 8.9–10.3)
Chloride: 101 mmol/L (ref 98–111)
Creatinine, Ser: 0.66 mg/dL (ref 0.44–1.00)
GFR, Estimated: >60 mL/min (ref >60)
Glucose, Bld: 107 mg/dL — ABNORMAL HIGH (ref 70–99)
Potassium: 3.5 mmol/L (ref 3.5–5.1)
Sodium: 133 mmol/L — ABNORMAL LOW (ref 135–145)

## 2023-05-17 LAB — CBC WITH DIFFERENTIAL/PLATELET
Abs Immature Granulocytes: 0.07 10*3/uL (ref 0.00–0.07)
Basophils Absolute: 0 10*3/uL (ref 0.0–0.1)
Basophils Relative: 0 %
Eosinophils Absolute: 0.1 10*3/uL (ref 0.0–0.5)
Eosinophils Relative: 1 %
HCT: 20.8 % — ABNORMAL LOW (ref 36.0–46.0)
Hemoglobin: 6.9 g/dL — ABNORMAL LOW (ref 12.0–15.0)
Immature Granulocytes: 1 %
Lymphocytes Relative: 13 %
Lymphs Abs: 1.3 10*3/uL (ref 0.7–4.0)
MCH: 30.8 pg (ref 26.0–34.0)
MCHC: 33.2 g/dL (ref 30.0–36.0)
MCV: 92.9 fL (ref 80.0–100.0)
Monocytes Absolute: 1.1 10*3/uL — ABNORMAL HIGH (ref 0.1–1.0)
Monocytes Relative: 11 %
Neutro Abs: 7.2 10*3/uL (ref 1.7–7.7)
Neutrophils Relative %: 74 %
Platelets: 317 10*3/uL (ref 150–400)
RBC: 2.24 MIL/uL — ABNORMAL LOW (ref 3.87–5.11)
RDW: 14.9 % (ref 11.5–15.5)
WBC: 9.8 10*3/uL (ref 4.0–10.5)
nRBC: 0 % (ref 0.0–0.2)

## 2023-05-17 LAB — CBC
HCT: 25.1 % — ABNORMAL LOW (ref 36.0–46.0)
Hemoglobin: 8.6 g/dL — ABNORMAL LOW (ref 12.0–15.0)
MCH: 30.6 pg (ref 26.0–34.0)
MCHC: 34.3 g/dL (ref 30.0–36.0)
MCV: 89.3 fL (ref 80.0–100.0)
Platelets: 372 10*3/uL (ref 150–400)
RBC: 2.81 MIL/uL — ABNORMAL LOW (ref 3.87–5.11)
RDW: 14.6 % (ref 11.5–15.5)
WBC: 8.9 10*3/uL (ref 4.0–10.5)
nRBC: 0 % (ref 0.0–0.2)

## 2023-05-17 LAB — PREPARE RBC (CROSSMATCH)

## 2023-05-17 MED ORDER — SODIUM CHLORIDE 0.9% IV SOLUTION: Freq: Once | INTRAVENOUS | Status: AC

## 2023-05-17 NOTE — Progress Notes (Signed)
  Progress Note    05/17/2023 3:31 PM 4 Days Post-Op  Subjective:  Jackie Macias is a 68 yo female who is now POD #4 from left lower below the knee amputation. She is recovering as expected. Dressing remains clean dry and intact. She endorses soreness to her extremity but no pain. No complaints overnight. Vitals all remain stable.    Vitals:   05/17/23 1220 05/17/23 1230  BP: 117/62 114/60  Pulse: 75 72  Resp: 18 16  Temp: 98.6 F (37 C) 98.5 F (36.9 C)  SpO2: 99% 98%   Physical Exam: Cardiac:  RRR, Normal S1, S2.  Lungs:  Clear on auscultation throughout. No rales rhonchi or wheezing. Non Labored breathing.  Incisions:  Left Stump. Staples intact. Dressing changed today at the bedside.  Extremities:  Right lower extremity with doppler DP/PT. Dressing to right heel. Left Lower extremity with ned BKA. Dressing changed today. No complications to note. Daily dressing changes needed.  Abdomen:  Positive bowel sounds throughout, soft non tender and non distended.  Neurologic: AAOX 4, answers all questions and follows commands appropriately.     CBC    Component Value Date/Time   WBC 9.8 05/17/2023 0553   RBC 2.24 (L) 05/17/2023 0553   HGB 6.9 (L) 05/17/2023 0553   HCT 20.8 (L) 05/17/2023 0553   PLT 317 05/17/2023 0553   MCV 92.9 05/17/2023 0553   MCH 30.8 05/17/2023 0553   MCHC 33.2 05/17/2023 0553   RDW 14.9 05/17/2023 0553   LYMPHSABS 1.3 05/17/2023 0553   MONOABS 1.1 (H) 05/17/2023 0553   EOSABS 0.1 05/17/2023 0553   BASOSABS 0.0 05/17/2023 0553    BMET    Component Value Date/Time   NA 133 (L) 05/17/2023 0553   NA 138 11/22/2022 1042   K 3.5 05/17/2023 0553   CL 101 05/17/2023 0553   CO2 24 05/17/2023 0553   GLUCOSE 107 (H) 05/17/2023 0553   BUN 13 05/17/2023 0553   BUN 18 11/22/2022 1042   CREATININE 0.66 05/17/2023 0553   CALCIUM 8.2 (L) 05/17/2023 0553   GFRNONAA >60 05/17/2023 0553   GFRAA >60 09/15/2018 1742    INR    Component Value Date/Time    INR 1.2 05/11/2023 0200     Intake/Output Summary (Last 24 hours) at 05/17/2023 1531 Last data filed at 05/17/2023 0300 Gross per 24 hour  Intake 0 ml  Output 500 ml  Net -500 ml     Assessment/Plan:  68 y.o. female is s/p  Left Lower extremity BKA.  4 Days Post-Op   Plan Encouraged patient to work with physical therapy and continue stump stretch exercises.  Dressing changed today at bedside by Vascular surgery. Nursing to do daily dressing changes.    Dressing Changes: Remove old dressing Xeroform applied over staple line then 4X4 Guaze applied over xeroform guaze then Covered with 2 ADB Pads then Wrapped with Kerlex X 1 roll then Wrap Stump with 6 inch ace bandage Snugly to reduce swelling.  Dressing to be changed Daily.    Okay per Vascular Surgery for patient to discharge to Rehab when medically ready. Patient to follow up with vascular surgery in 3 weeks after discharge for staple removal.    DVT prophylaxis:  ASA 81 mg daily, Plavix 75 mg Daily, Heparin 5000 units SQ Q 8hrs.    [name] 05/17/2023 3:31 PM

## 2023-05-17 NOTE — Progress Notes (Signed)
Progress Note   Patient: Jackie Macias AVW:[id number] DOB: [birth year] DOA: 05/10/2023     7 DOS: the patient was seen and examined on 05/17/2023     Brief hospital course:  Jackie Macias is a 68 y.o. female with medical history significant of chronic left foot wound,  PVD, HTN, HLD, COPD, depression, gout, anemia, tobacco abuse, who presents with left foot pain and wound.  Pt has chronic left foot wound, which has been progressively worsening the past 3 weeks. She has at home wound care of the past few weeks but wasn't getting better.  Pt was seen by Dr. Ether Griffins of podiatry on the day of presentation and found that pt has entire foot gangrene and sent to hospital for further evaluation and treatment.  Dr. Ether Griffins recommends left foot amputation.    Imaging showing findings suspicious for osteomyelitis of the left foot.  Patient was seen by vascular surgeon and currently status post left below-knee amputation.     Assessment and Plan:    Foot osteomyelitis, left (HCC):  I have reviewed patient's x-ray of the foot showing findings suspicious for osteomyelitis involving the great toe of the left foot Continue as needed Zofran Patient underwent a left below-knee amputation by vascular surgery on 05/13/2023 Has completed 72 hours of antibiotics postprocedure Antibiotics have been discontinued as recommended by ID pharmacist.   Anemia likely secondary to postoperative anemia We will continue to monitor CBC closely and transfuse for hemoglobin less than 7 Patient denies any active bleeding Hemoglobin today 6.9 We will transfuse a unit of packed RBC today   Peripheral vascular disease (HCC): Pt has history of ischemic left foot ulcer.  Had a CT angio done in December that showed solitary vessel runoff via reconstituted treating her [name] artery, left-sided dorsalis pedis artery is not identified.  No distal embolization noted.  She had a vascular ultrasound done on 21 January that showed left ABI is  within normal limit.  Left toe brachial index is abnormal.  Presumed Plavix Continue aspirin and statin therapy   Essential hypertension: Bp is 177/76 -IV hydralazine as needed  -Continue to hold Lasix Continue amlodipine 5 mg daily   HLD (hyperlipidemia) -Continue Crestor   Chronic obstructive pulmonary disease (COPD) (HCC): stable -Continue bronchodilators and as needed Mucinex   Iron deficiency anemia: Hemoglobin stable 11.6 (8.1 on 03/13/2023) Continue iron supplement   Depression Continue home medications   Tobacco use disorder Continue nicotine patch   DVT ppx: Heparin on hold given anemia   Code Status: Full code    Family Communication:    Yes, patient's niece    at bed side.        Disposition Plan: Patient currently awaiting rehab     Subjective:  Patient denied any acute overnight event Denies chest pain nausea vomiting fever or cough   Physical Exam:   General: Not in acute distress.  Dry mucous membrane   Heme: No neck lymph node enlargement. Cardiac: S1/S2, RRR, No murmurs, No gallops or rubs. Respiratory: No rales, wheezing, rhonchi or rubs. GI: Soft, nondistended, nontender, no rebound pain, no organomegaly, BS present. GU: No hematuria Ext: No pitting leg edema bilaterally.  Left below-knee amputation with dressing that is clean and dry   Data Reviewed:    Latest Ref Rng & Units 05/17/2023    5:53 AM 05/16/2023    6:23 AM 05/15/2023   12:32 AM  BMP  Glucose 70 - 99 mg/dL 829  562  130  BUN 8 - 23 mg/dL 13  12  11    Creatinine 0.44 - 1.00 mg/dL 4.09  8.11  9.14   Sodium 135 - 145 mmol/L 133  134  131   Potassium 3.5 - 5.1 mmol/L 3.5  3.6  3.8   Chloride 98 - 111 mmol/L 101  103  98   CO2 22 - 32 mmol/L 24  24  24    Calcium 8.9 - 10.3 mg/dL 8.2  8.4  8.4     Vitals:   05/17/23 1220 05/17/23 1230 05/17/23 1545 05/17/23 1603  BP: 117/62 114/60 118/66 (!) 120/54  Pulse: 75 72 75 71  Resp: 18 16 18 18   Temp: 98.6 F (37 C) 98.5 F (36.9  C) 98.8 F (37.1 C) 98.4 F (36.9 C)  TempSrc: Oral Oral Oral Oral  SpO2: 99% 98%  100%  Weight:      Height:          Latest Ref Rng & Units 05/17/2023    4:12 PM 05/17/2023    5:53 AM 05/16/2023    6:23 AM  CBC  WBC 4.0 - 10.5 K/uL 8.9  9.8  18.4   Hemoglobin 12.0 - 15.0 g/dL 8.6  6.9  7.6   Hematocrit 36.0 - 46.0 % 25.1  20.8  23.1   Platelets 150 - 400 K/uL 372  317  309      Author: Loyce Dys, MD 05/17/2023 4:37 PM  For on call review [email or web address].

## 2023-05-17 NOTE — Progress Notes (Signed)
Physical Therapy Treatment Patient Details Name: Jackie Macias MRN: [id number] DOB: May 06, 1955 Today's Date: 05/17/2023   History of Present Illness Jackie Macias is a 68 y.o. female with medical history significant of chronic left foot wound,  PVD, HTN, HLD, COPD, depression, gout, anemia, tobacco abuse, who presents with left foot pain and wound.  S/P L BKA    PT Comments  Pt received in bed after just finishing blood transfusion from low Hgb of 6.9 PT session focused of transfer bed to chair via lateral scoot and upper body strength with Min/CGA. Pt educated at length and issued materials on BKA positioning, exercises, and care. Also provided time line info for prosthetic fitting and proper self donning technique of figure "8" ace wrapping. Pt remains very motivated and feeling better, will continue to progress in acute setting.   If plan is discharge home, recommend the following: A lot of help with walking and/or transfers;A little help with bathing/dressing/bathroom;Assist for transportation   Can travel by private vehicle     No  Equipment Recommendations  Other (comment) (TBD at next level of care)    Recommendations for Other Services       Precautions / Restrictions Precautions Precautions: Fall Recall of Precautions/Restrictions: Intact Restrictions Weight Bearing Restrictions Per Provider Order: Yes Other Position/Activity Restrictions: NWB L LE (new BKA)     Mobility  Bed Mobility Overal bed mobility: Modified Independent             General bed mobility comments: increased time and effort, but no physical assist provided; including scooting from middle toward HOB in sitting and supine    Transfers Overall transfer level: Needs assistance Equipment used: None Transfers: Bed to chair/wheelchair/BSC            Lateral/Scoot Transfers: Min assist General transfer comment: Pt able to utilize upper body strength and R foot contact on floor to scoot  self bed to chair    Ambulation/Gait               General Gait Details: patient declined awaiting more supportive shoe to be brought in   [employer]             Wheelchair Mobility     Tilt Bed    Modified Rankin (Stroke Patients Only)       Balance Overall balance assessment: Needs assistance Sitting-balance support: Feet supported Sitting balance-Leahy Scale: Good                                      Communication Communication Communication: No apparent difficulties  Cognition Arousal: Alert Behavior During Therapy: WFL for tasks assessed/performed   PT - Cognitive impairments: No apparent impairments                         Following commands: Intact      Cueing Cueing Techniques: Verbal cues  Exercises General Exercises - Lower Extremity Ankle Circles/Pumps: AROM, Right, 10 reps Quad Sets: AROM, Left, 10 reps Long Arc Quad: AROM, Left, 10 reps, Seated Hip Flexion/Marching: AROM, Left, 5 reps Other Exercises Other Exercises: Pt educated on proper positioning of L BKA, strengthening and stretching exercises, ace wrapping for stump, and timeline for prosthesis fitting Other Exercises: Handouts given to pt for review and reference    General Comments General comments (skin integrity, edema, etc.): L stump ace wrap properly intact. Heel  protector bandage on R heel      Pertinent Vitals/Pain Pain Assessment Pain Assessment: 0-10 Pain Score: 4  Pain Location: L stump Pain Descriptors / Indicators: Aching, Sharp Pain Intervention(s): Monitored during session    Home Living                          Prior Function            PT Goals (current goals can now be found in the care plan section) Acute Rehab PT Goals Patient Stated Goal: to get more therapy Progress towards PT goals: Progressing toward goals    Frequency    7X/week      PT Plan      Co-evaluation              AM-PAC PT "6  Clicks" Mobility   Outcome Measure  Help needed turning from your back to your side while in a flat bed without using bedrails?: A Little Help needed moving from lying on your back to sitting on the side of a flat bed without using bedrails?: A Little Help needed moving to and from a bed to a chair (including a wheelchair)?: A Lot Help needed standing up from a chair using your arms (e.g., wheelchair or bedside chair)?: A Lot Help needed to walk in hospital room?: A Lot Help needed climbing 3-5 steps with a railing? : Total 6 Click Score: 13    End of Session Equipment Utilized During Treatment: Gait belt Activity Tolerance: Patient tolerated treatment well Patient left: in chair;with call bell/phone within reach Nurse Communication: Mobility status PT Visit Diagnosis: Other abnormalities of gait and mobility (R26.89);Muscle weakness (generalized) (M62.81);Difficulty in walking, not elsewhere classified (R26.2);History of falling (Z91.81)     Time: 5409-8119 PT Time Calculation (min) (ACUTE ONLY): 41 min  Charges:    $Therapeutic Exercise: 8-22 mins $Therapeutic Activity: 23-37 mins PT General Charges $$ ACUTE PT VISIT: 1 Visit           Zadie Cleverly, PTA  Jannet Askew 05/17/2023, 5:14 PM

## 2023-05-17 NOTE — Plan of Care (Signed)

## 2023-05-18 LAB — CBC WITH DIFFERENTIAL/PLATELET
Abs Immature Granulocytes: 0.06 10*3/uL (ref 0.00–0.07)
Basophils Absolute: 0 10*3/uL (ref 0.0–0.1)
Basophils Relative: 0 %
Eosinophils Absolute: 0.1 10*3/uL (ref 0.0–0.5)
Eosinophils Relative: 1 %
HCT: 23.6 % — ABNORMAL LOW (ref 36.0–46.0)
Hemoglobin: 8 g/dL — ABNORMAL LOW (ref 12.0–15.0)
Immature Granulocytes: 1 %
Lymphocytes Relative: 19 %
Lymphs Abs: 1.4 10*3/uL (ref 0.7–4.0)
MCH: 31.1 pg (ref 26.0–34.0)
MCHC: 33.9 g/dL (ref 30.0–36.0)
MCV: 91.8 fL (ref 80.0–100.0)
Monocytes Absolute: 0.9 10*3/uL (ref 0.1–1.0)
Monocytes Relative: 12 %
Neutro Abs: 5 10*3/uL (ref 1.7–7.7)
Neutrophils Relative %: 67 %
Platelets: 356 10*3/uL (ref 150–400)
RBC: 2.57 MIL/uL — ABNORMAL LOW (ref 3.87–5.11)
RDW: 15 % (ref 11.5–15.5)
WBC: 7.5 10*3/uL (ref 4.0–10.5)
nRBC: 0 % (ref 0.0–0.2)

## 2023-05-18 LAB — BPAM RBC
Blood Product Expiration Date: 202503212359
ISSUE DATE / TIME: 202502181219
Unit Type and Rh: 6200

## 2023-05-18 LAB — BASIC METABOLIC PANEL
Anion gap: 8 (ref 5–15)
BUN: 11 mg/dL (ref 8–23)
CO2: 24 mmol/L (ref 22–32)
Calcium: 8.3 mg/dL — ABNORMAL LOW (ref 8.9–10.3)
Chloride: 102 mmol/L (ref 98–111)
Creatinine, Ser: 0.51 mg/dL (ref 0.44–1.00)
GFR, Estimated: >60 mL/min (ref >60)
Glucose, Bld: 122 mg/dL — ABNORMAL HIGH (ref 70–99)
Potassium: 3.6 mmol/L (ref 3.5–5.1)
Sodium: 134 mmol/L — ABNORMAL LOW (ref 135–145)

## 2023-05-18 LAB — TYPE AND SCREEN
ABO/RH(D): A POS
Antibody Screen: NEGATIVE
Unit division: 0

## 2023-05-18 MED ORDER — AMLODIPINE BESYLATE 5 MG PO TABS: 5.0000 mg | ORAL_TABLET | Freq: Every day | ORAL | Status: DC

## 2023-05-18 MED ORDER — POLYETHYLENE GLYCOL 3350 17 G PO PACK
17.0000 g | PACK | Freq: Every day | ORAL | 0 refills | Status: AC | PRN
Start: 2023-05-18 — End: ?

## 2023-05-18 MED ORDER — NICOTINE 21 MG/24HR TD PT24: 21.0000 mg | MEDICATED_PATCH | Freq: Every day | TRANSDERMAL | Status: AC

## 2023-05-18 MED ORDER — OXYCODONE-ACETAMINOPHEN 5-325 MG PO TABS
1.0000 | ORAL_TABLET | ORAL | 0 refills | Status: DC | PRN
Start: 2023-05-18 — End: 2023-06-01

## 2023-05-18 MED ORDER — DOCUSATE SODIUM 100 MG PO CAPS: 100.0000 mg | ORAL_CAPSULE | Freq: Two times a day (BID) | ORAL | Status: DC

## 2023-05-18 MED ORDER — DM-GUAIFENESIN ER 30-600 MG PO TB12: 1.0000 | ORAL_TABLET | Freq: Two times a day (BID) | ORAL | Status: DC | PRN

## 2023-05-18 NOTE — TOC Progression Note (Signed)
Transition of Care [facility]) - Progression Note    Patient Details  Name: Jackie Macias MRN: [id number] Date of Birth: [birth year]  Transition of [facility] CM/SW Contact  Chapman Fitch, RN Phone Number: 05/18/2023, 12:48 PM  Clinical Narrative:     Therapy recommending SNF Per PT patient in agreement and requesting [facility] search started, and contacted [facility] to review        Expected Discharge Plan and Services                                               Social Determinants of Health (SDOH) Interventions SDOH Screenings   Food Insecurity: No Food Insecurity (05/11/2023)  Housing: High Risk (05/11/2023)  Transportation Needs: No Transportation Needs (05/11/2023)  Utilities: Not At Risk (05/11/2023)  Depression (PHQ2-9): High Risk (04/29/2023)  Social Connections: Unknown (05/11/2023)  Tobacco Use: High Risk (05/13/2023)    Readmission Risk Interventions     No data to display

## 2023-05-18 NOTE — Plan of Care (Signed)

## 2023-05-18 NOTE — Progress Notes (Deleted)
Progress Note   Patient: Jackie Macias AOZ:[id number] DOB: Apr 19, 1955 DOA: 05/10/2023     8 DOS: the patient was seen and examined on 05/18/2023     Brief hospital course:  Jackie Macias is a 68 y.o. female with medical history significant of chronic left foot wound,  PVD, HTN, HLD, COPD, depression, gout, anemia, tobacco abuse, who presents with left foot pain and wound.  Pt has chronic left foot wound, which has been progressively worsening the past 3 weeks. She has at home wound care of the past few weeks but wasn't getting better.  Pt was seen by Dr. Ether Macias of podiatry on the day of presentation and found that pt has entire foot gangrene and sent to hospital for further evaluation and treatment.  Dr. Ether Macias recommends left foot amputation.    Imaging showing findings suspicious for osteomyelitis of the left foot.  Patient was seen by vascular surgeon and currently status post left below-knee amputation.     Assessment and Plan:    Foot osteomyelitis, left (HCC):  X-ray of the foot suspicious for osteomyelitis involving the great toe of the left foot Status post LEFT BKA with vascular surgery on 05/13/2023 Completed 72 hours of antibiotics postprocedure Antibiotics have been discontinued as recommended by ID pharmacist.   Anemia likely secondary to postoperative anemia Transfused 1 unit pRBC's on 2/18 for Hbg 6.9.   No signs of significant active bleeding. Hbg today 8.6 >> 8.0 --Monitor CBC  --Transfuse for hemoglobin less than 7    Peripheral vascular disease (HCC): Pt has history of ischemic left foot ulcer.  Had a CT angio done in December that showed solitary vessel runoff via reconstituted treating her Jackie Macias artery, left-sided dorsalis pedis artery is not identified.  No distal embolization noted.  She had a vascular ultrasound done on 21 January that showed left ABI is within normal limit.  Left toe brachial index is abnormal.  Presumed Plavix Continue aspirin and statin  therapy   Essential hypertension: Bp is 177/76 -IV hydralazine as needed  --Continue to hold Lasix -Continue amlodipine 5 mg daily   HLD (hyperlipidemia) --Continue Crestor   Chronic obstructive pulmonary disease (COPD) (HCC): stable --Continue bronchodilators and as needed Mucinex   Iron deficiency anemia: Hemoglobin stable 11.6 (8.1 on 03/13/2023) --Continue iron supplement   Depression --Continue home medications   Tobacco use disorder --Continue nicotine patch   DVT ppx: Heparin on hold given anemia   Code Status: Full code    Family Communication:    None present on rounds this AM.  Will attempt to call as time allows but currently no new medical updates to provide.        Disposition Plan: Medically stable for d/c to SNF when bed available     Subjective:  Pt awake resting in bed this AM.  She denies acute complaints or issues. Agreeable for rehab and hopes to go soon.     Physical Exam:   General exam: awake, alert, no acute distress HEENT: moist mucus membranes, hearing grossly normal  Respiratory system: CTAB, no wheezes, rales or rhonchi, normal respiratory effort. Cardiovascular system: normal S1/S2, RRR   Gastrointestinal system: soft, NT, ND, no HSM felt, +bowel sounds. Central nervous system: A&O x 3. no gross focal neurologic deficits, normal speech Extremities: Left BKA stump dressing clean dry intact, no edema, normal tone Skin: dry, intact, normal temperaturers Psychiatry: normal mood, congruent affect, judgement and insight appear normal    Data Reviewed:    Latest  Ref Rng & Units 05/18/2023    5:44 AM 05/17/2023    5:53 AM 05/16/2023    6:23 AM  BMP  Glucose 70 - 99 mg/dL 409  811  914   BUN 8 - 23 mg/dL 11  13  12    Creatinine 0.44 - 1.00 mg/dL 7.82  9.56  2.13   Sodium 135 - 145 mmol/L 134  133  134   Potassium 3.5 - 5.1 mmol/L 3.6  3.5  3.6   Chloride 98 - 111 mmol/L 102  101  103   CO2 22 - 32 mmol/L 24  24  24    Calcium 8.9 -  10.3 mg/dL 8.3  8.2  8.4     Vitals:   05/17/23 1603 05/17/23 2021 05/18/23 0326 05/18/23 0808  BP: (!) 120/54 (!) 120/48 123/60 (!) 120/57  Pulse: 71 72 71 65  Resp: 18 18 18 18   Temp: 98.4 F (36.9 C) 99 F (37.2 C) 98.2 F (36.8 C) 98 F (36.7 C)  TempSrc: Oral Oral Oral Oral  SpO2: 100% 100% 97% 99%  Weight:      Height:          Latest Ref Rng & Units 05/18/2023    5:44 AM 05/17/2023    4:12 PM 05/17/2023    5:53 AM  CBC  WBC 4.0 - 10.5 K/uL 7.5  8.9  9.8   Hemoglobin 12.0 - 15.0 g/dL 8.0  8.6  6.9   Hematocrit 36.0 - 46.0 % 23.6  25.1  20.8   Platelets 150 - 400 K/uL 356  372  317      Author: Pennie Banter, DO 05/18/2023 2:19 PM  For on call review [email or web address].

## 2023-05-18 NOTE — Progress Notes (Signed)
Occupational Therapy Treatment Patient Details Name: Jackie Macias MRN: [id number] DOB: 1955/10/28 Today's Date: 05/18/2023   History of present illness Jackie Macias is a 68 y.o. female with medical history significant of chronic left foot wound,  PVD, HTN, HLD, COPD, depression, gout, anemia, tobacco abuse, who presents with left foot pain and wound.  S/P L BKA   OT comments  Pt awake and alert and agreeable to OT/PT cotx.  Cotx completed to progress pt from scoot transfer to sit to stand.  Able to perform sit to stand x2 trials with mod A of 2.  Max vc for hand placement in prep for transfer and mod vc/tactile cues for body mechanics/posture in standing, specifically tucking in buttocks and extending elbows with hands on walker for better upright posture.  Improved with lateral scoot transfer from bed to chair per PTA report of min A yesterday, performing with supv today.  Good return demo of chair pushups once in chair x12 reps.  Continue to recommend SNF upon d/c to maximize indep and safety with ADLs and functional mobility.        If plan is discharge home, recommend the following:  Two people to help with walking and/or transfers;Assistance with cooking/housework;Assist for transportation;Help with stairs or ramp for entrance;A lot of help with bathing/dressing/bathroom   Equipment Recommendations  Other (comment) (defer to next venue of care)    Recommendations for Other Services      Precautions / Restrictions Precautions Precautions: Fall Recall of Precautions/Restrictions: Intact Restrictions Weight Bearing Restrictions Per Provider Order: Yes Other Position/Activity Restrictions: NWB L LE (new BKA)       Mobility Bed Mobility Overal bed mobility: Modified Independent                  Transfers Overall transfer level: Needs assistance Equipment used: Rolling walker (2 wheels) Transfers: Bed to chair/wheelchair/BSC Sit to Stand: +2 physical assistance, From  elevated surface          Lateral/Scoot Transfers: Supervision, From elevated surface General transfer comment: Good clearance of buttocks during scoot from bed to chair with increased confidence bearing weight into the RLE     Balance Overall balance assessment: Needs assistance Sitting-balance support: Feet supported Sitting balance-Leahy Scale: Good       Standing balance-Leahy Scale: Poor Standing balance comment: 2 person mod A for sit to stand and static standing with walker x2 trials                           ADL either performed or assessed with clinical judgement   ADL Overall ADL's : Needs assistance/impaired                                     Functional mobility during ADLs: Moderate assistance;+2 for safety/equipment;Rolling walker (2 wheels) General ADL Comments: transferred up to chair in prep for lunch sitting upright in recliner    Extremity/Trunk Assessment Upper Extremity Assessment Upper Extremity Assessment: Overall WFL for tasks assessed   Lower Extremity Assessment Lower Extremity Assessment: Generalized weakness;LLE deficits/detail LLE Deficits / Details: L BKA   Cervical / Trunk Assessment Cervical / Trunk Assessment: Normal    Vision Patient Visual Report: No change from baseline     Perception     Praxis     Communication Communication Communication: No apparent difficulties   Cognition Arousal: Alert Behavior  During Therapy: WFL for tasks assessed/performed                                 Following commands: Intact        Cueing   Cueing Techniques: Verbal cues  Exercises Other Exercises Other Exercises: chair push ups x12 reps with good clearance of buttocks from chair and good ability to bear weight into RLE.  Encouraged completion several sets daily for pressure relief and BUE strengthening in prep for transfers.    Shoulder Instructions       General Comments      Pertinent  Vitals/ Pain       Pain Assessment Pain Assessment: 0-10 Pain Score: 4  Pain Location: L stump Pain Descriptors / Indicators: Aching, Sharp Pain Intervention(s): Monitored during session, Repositioned, Limited activity within patient's tolerance, Premedicated before session  Home Living                                          Prior Functioning/Environment              Frequency  Min 1X/week        Progress Toward Goals  OT Goals(current goals can now be found in the care plan section)  Progress towards OT goals: Progressing toward goals  Acute Rehab OT Goals Patient Stated Goal: d/c to short term rehab OT Goal Formulation: With patient Time For Goal Achievement: 05/30/23 Potential to Achieve Goals: Good  Plan      Co-evaluation    PT/OT/SLP Co-Evaluation/Treatment: Yes Reason for Co-Treatment: For patient/therapist safety;To address functional/ADL transfers PT goals addressed during session: Mobility/safety with mobility OT goals addressed during session: ADL's and self-care;Strengthening/ROM;Proper use of Adaptive equipment and DME      AM-PAC OT "6 Clicks" Daily Activity     Outcome Measure   Help from another person eating meals?: None Help from another person taking care of personal grooming?: A Little Help from another person toileting, which includes using toliet, bedpan, or urinal?: A Lot Help from another person bathing (including washing, rinsing, drying)?: A Little Help from another person to put on and taking off regular upper body clothing?: None Help from another person to put on and taking off regular lower body clothing?: A Lot 6 Click Score: 18    End of Session Equipment Utilized During Treatment: Gait belt;Rolling walker (2 wheels)  OT Visit Diagnosis: Other abnormalities of gait and mobility (R26.89);Muscle weakness (generalized) (M62.81);Pain Pain - Right/Left: Left Pain - part of body: Leg   Activity Tolerance  Patient tolerated treatment well   Patient Left in chair;with call bell/phone within reach;Other (comment) (PTA  still present)   Nurse Communication          Time: 0981-1914 OT Time Calculation (min): 26 min  Charges: OT General Charges $OT Visit: 1 Visit OT Treatments $Self Care/Home Management : 8-22 mins  Danelle Earthly, MS, OTR/L   Otis Dials 05/18/2023, 12:36 PM

## 2023-05-18 NOTE — Progress Notes (Signed)
Physical Therapy Treatment Patient Details Name: ANAYANSI RUNDQUIST MRN: [id number] DOB: 09/14/1955 Today's Date: 05/18/2023   History of Present Illness EARA BURRUEL is a 68 y.o. female with medical history significant of chronic left foot wound,  PVD, HTN, HLD, COPD, depression, gout, anemia, tobacco abuse, who presents with left foot pain and wound.  S/P L BKA    PT Comments  Pt seen with OT during part of session to safely focus on progressing transfers/mobility. Pt was able to stand several times from raised bed with ModA of 2, RW, and manual support to facilitate R knee extension and prevent buckling. Lateral scoot from bed to chair with B UE, R LE, and SBA with cues given for technique, improved from previous day. Pt encouraged to complete B quad sets while up in chair to prevent L knee contracture and facilitate R knee extension due to joint tightness. Overall, great progress. Pt is awaiting STR prior to returning home.    If plan is discharge home, recommend the following: A lot of help with walking and/or transfers;A little help with bathing/dressing/bathroom;Assist for transportation   Can travel by private vehicle     No  Equipment Recommendations  Other (comment) (TBD at next level of care)    Recommendations for Other Services       Precautions / Restrictions Precautions Precautions: Fall Recall of Precautions/Restrictions: Intact Precaution/Restrictions Comments: New L BKA Restrictions Weight Bearing Restrictions Per Provider Order: Yes Other Position/Activity Restrictions: NWB L LE (new BKA)     Mobility  Bed Mobility Overal bed mobility: Modified Independent             General bed mobility comments: increased time and effort, but no physical assist provided; including scooting from middle toward HOB in sitting and supine    Transfers Overall transfer level: Needs assistance Equipment used: Rolling walker (2 wheels) Transfers: Sit to/from Stand, Bed to  chair/wheelchair/BSC Sit to Stand: +2 physical assistance, From elevated surface, Mod assist          Lateral/Scoot Transfers: Supervision, From elevated surface General transfer comment: Good clearance of buttocks during scoot from bed to chair with increased confidence bearing weight into the RLE, difficulty attaining R knee extension in standing    Ambulation/Gait               General Gait Details: patient declined awaiting more supportive shoe to be brought in   Stairs             Wheelchair Mobility     Tilt Bed    Modified Rankin (Stroke Patients Only)       Balance Overall balance assessment: Needs assistance Sitting-balance support: Feet supported Sitting balance-Leahy Scale: Good Sitting balance - Comments: Able to reach forward to don R foot sock without LOB   Standing balance support: Bilateral upper extremity supported, Reliant on assistive device for balance Standing balance-Leahy Scale: Poor Standing balance comment: 2 person mod A for sit to stand and static standing with walker x2 trials                            Communication Communication Communication: No apparent difficulties  Cognition Arousal: Alert Behavior During Therapy: WFL for tasks assessed/performed   PT - Cognitive impairments: No apparent impairments                       PT - Cognition Comments: Pleasant and cooperative Following  commands: Intact      Cueing Cueing Techniques: Verbal cues  Exercises General Exercises - Lower Extremity Gluteal Sets: AROM, Both, 10 reps Long Arc Quad: AROM, Left, 10 reps, Seated Other Exercises Other Exercises: Pt educated on proper positioning of L BKA, strengthening and stretching exercises, ace wrapping for stump, and timeline for prosthesis fitting Other Exercises: Handouts given to pt for review and reference Other Exercises: R LE hamstring stretch    General Comments General comments (skin integrity,  edema, etc.):  (L stump dressing intact. Adjusted ace wrap to reduce lateral dog ears from forming)      Pertinent Vitals/Pain Pain Assessment Pain Assessment: Faces Faces Pain Scale: Hurts a little bit Pain Location: L stump Pain Descriptors / Indicators: Aching, Sharp Pain Intervention(s): Monitored during session    Home Living                          Prior Function            PT Goals (current goals can now be found in the care plan section) Acute Rehab PT Goals Patient Stated Goal: to get more therapy Progress towards PT goals: Progressing toward goals    Frequency    7X/week      PT Plan      Co-evaluation PT/OT/SLP Co-Evaluation/Treatment: Yes Reason for Co-Treatment: For patient/therapist safety;To address functional/ADL transfers PT goals addressed during session: Mobility/safety with mobility OT goals addressed during session: ADL's and self-care;Strengthening/ROM;Proper use of Adaptive equipment and DME      AM-PAC PT "6 Clicks" Mobility   Outcome Measure  Help needed turning from your back to your side while in a flat bed without using bedrails?: A Little Help needed moving from lying on your back to sitting on the side of a flat bed without using bedrails?: A Little Help needed moving to and from a bed to a chair (including a wheelchair)?: A Lot Help needed standing up from a chair using your arms (e.g., wheelchair or bedside chair)?: A Lot Help needed to walk in hospital room?: A Lot Help needed climbing 3-5 steps with a railing? : Total 6 Click Score: 13    End of Session Equipment Utilized During Treatment: Gait belt Activity Tolerance: Patient tolerated treatment well Patient left: in chair;with call bell/phone within reach Nurse Communication: Mobility status PT Visit Diagnosis: Other abnormalities of gait and mobility (R26.89);Muscle weakness (generalized) (M62.81);Difficulty in walking, not elsewhere classified (R26.2);History of  falling (Z91.81)     Time: 7829-5621 PT Time Calculation (min) (ACUTE ONLY): 38 min  Charges:    $Therapeutic Activity: 23-37 mins PT General Charges $$ ACUTE PT VISIT: 1 Visit                    Zadie Cleverly, PTA  Jannet Askew 05/18/2023, 1:57 PM

## 2023-05-18 NOTE — Discharge Summary (Signed)
Physician Discharge Summary   Patient: Jackie Macias MRN: [id number] DOB: 14-Mar-1956  Admit date:     05/10/2023  Discharge date: 05/19/2023  Discharge Physician: Pennie Banter   PCP: Miki Kins, FNP   Recommendations at discharge:   Follow up with vascular surgery in 3 weeks after discharge for staple removal.  Follow up with Primary Care in 1-2 weeks Repeat CBC, BMP s at follow up  Discharge Diagnoses: Principal Problem:   Foot osteomyelitis, left (HCC) Active Problems:   Peripheral vascular disease (HCC)   Essential hypertension   HLD (hyperlipidemia)   Chronic obstructive pulmonary disease (COPD) (HCC)   Iron deficiency anemia   Depression   Tobacco use disorder   Foot infection   Gangrene (HCC)  Resolved Problems:   * No resolved hospital problems. *  Hospital Course:  "Jackie Macias is a 68 y.o. female with medical history significant of chronic left foot wound,  PVD, HTN, HLD, COPD, depression, gout, anemia, tobacco abuse, who presents with left foot pain and wound.  Pt has chronic left foot wound, which has been progressively worsening the past 3 weeks. She has at home wound care of the past few weeks but wasn't getting better.  Pt was seen by Dr. Ether Griffins of podiatry on the day of presentation and found that pt has entire foot gangrene and sent to hospital for further evaluation and treatment.  Dr. Ether Griffins recommends left foot amputation.    Imaging showing findings suspicious for osteomyelitis of the left foot.  Patient was seen by vascular surgeon and currently status post left below-knee amputation."   2/19 -- Pt awake resting in bed this AM. She denies acute complaints or issues. Agreeable for rehab and hopes to go soon.  Notified by case manager this afternoon, SNF bed is ready today. Pt is medically stable for discharge to rehab.  2/20 -- No EMS transport was available for discharge yesterday.   Pt remains medically stable for discharge  today.  Assessment and Plan:  Foot osteomyelitis, left (HCC):  X-ray of the foot suspicious for osteomyelitis involving the great toe of the left foot Status post LEFT BKA with vascular surgery on 05/13/2023 Completed 72 hours of antibiotics postprocedure Antibiotics have been discontinued as recommended by ID pharmacist. Pt remains afebrile and clinically stable off antibiotics --Follow up with Vascular Surgery in 3 weeks for staple removal   Anemia secondary to postoperative anemia Hx of iron deficiency anemia Transfused 1 unit pRBC's on 2/18 for Hbg 6.9.   No signs of significant active bleeding. Hbg today 8.6 >> 8.0 --Monitor CBC  --Transfuse for hemoglobin less than 7 --Continue iron supplement --Monitor CBC at follow up    Peripheral vascular disease [facility]): Pt has history of ischemic left foot ulcer.  Had a CT angio done in December that showed solitary vessel runoff via reconstituted treating her [name] artery, left-sided dorsalis pedis artery is not identified.  No distal embolization noted.  She had a vascular ultrasound done on 21 January that showed left ABI is within normal limit.  Left toe brachial index is abnormal.  Presumed Plavix Continue aspirin and statin therapy   Essential hypertension:  --Continue Lasix, amlodipine  --HOLDING lisinopril-hydrochlorothiazide -- resume at follow up as indicated based on BP's, electrolytes and renal function   HLD (hyperlipidemia) --Continue Crestor   COPD: stable --Continue bronchodilators and as needed Mucinex   Depression --Continue home medications   Tobacco use disorder --Continue nicotine patch  Consultants: Vascular Surgery, Podiatry Procedures performed: as above  Disposition: Skilled nursing facility Diet recommendation:  Discharge Diet Orders (From admission, onward)     Start     Ordered   05/18/23 0000  Diet - low sodium heart healthy        05/18/23 1501            DISCHARGE  MEDICATION: Allergies as of 05/18/2023       Reactions   Lisinopril-hydrochlorothiazide Other (See Comments)        Medication List     STOP taking these medications    doxycycline 100 MG tablet Commonly known as: VIBRA-TABS   oxyCODONE 5 MG immediate release tablet Commonly known as: Oxy IR/ROXICODONE   sodium chloride 1 g tablet       TAKE these medications    acetaminophen 325 MG tablet Commonly known as: TYLENOL Take 2 tablets (650 mg total) by mouth every 6 (six) hours as needed for mild pain (pain score 1-3) (or Fever >/= 101).   albuterol 108 (90 Base) MCG/ACT inhaler Commonly known as: VENTOLIN HFA Inhale 2 puffs into the lungs every 6 (six) hours as needed for wheezing or shortness of breath.   amLODipine 5 MG tablet Commonly known as: NORVASC Take 1 tablet (5 mg total) by mouth daily. Start taking on: May 19, 2023   aspirin EC 81 MG tablet Take 1 tablet (81 mg total) by mouth daily. Swallow whole.   clopidogrel 75 MG tablet Commonly known as: Plavix Take 1 tablet (75 mg total) by mouth daily.   dextromethorphan-guaiFENesin 30-600 MG 12hr tablet Commonly known as: MUCINEX DM Take 1 tablet by mouth 2 (two) times daily as needed for cough.   docusate sodium 100 MG capsule Commonly known as: COLACE Take 1 capsule (100 mg total) by mouth 2 (two) times daily.   DULoxetine 30 MG capsule Commonly known as: Cymbalta Take 1 capsule (30 mg total) by mouth daily.   ferrous sulfate 325 (65 FE) MG tablet Take 1 tablet (325 mg total) by mouth daily with breakfast.   furosemide 20 MG tablet Commonly known as: LASIX Take 1 tablet (20 mg total) by mouth daily as needed for edema.   gabapentin 600 MG tablet Commonly known as: Neurontin Take 1 tablet (600 mg total) by mouth daily.   ipratropium-albuterol 0.5-2.5 (3) MG/3ML Soln Commonly known as: DUONEB Take 3 mLs by nebulization every 6 (six) hours as needed.   nicotine 21 mg/24hr patch Commonly  known as: NICODERM CQ - dosed in mg/24 hours Place 1 patch (21 mg total) onto the skin daily. Start taking on: May 19, 2023   oxyCODONE-acetaminophen 5-325 MG tablet Commonly known as: PERCOCET/ROXICET Take 1-2 tablets by mouth every 4 (four) hours as needed for moderate pain (pain score 4-6) (and severe pain).   pantoprazole 40 MG tablet Commonly known as: PROTONIX Take 1 tablet (40 mg total) by mouth every morning.   polyethylene glycol 17 g packet Commonly known as: MIRALAX / GLYCOLAX Take 17 g by mouth daily as needed. What changed:  when to take this reasons to take this   rosuvastatin 20 MG tablet Commonly known as: CRESTOR Take 1 tablet (20 mg total) by mouth daily.   Vitamin D (Ergocalciferol) 1.25 MG (50000 UNIT) Caps capsule Commonly known as: DRISDOL Take 1 capsule (50,000 Units total) by mouth every 7 (seven) days.               Discharge Care Instructions  (From admission,  onward)           Start     Ordered   05/18/23 0000  Leave dressing on - Keep it clean, dry, and intact until clinic visit        05/18/23 1501            Contact information for after-discharge care     Destination     [facility] Preferred SNF .   Service: Skilled Nursing Contact information: [address] [place] [address] [phone]                    Discharge Exam: Ceasar Mons Weights   05/10/23 1620 05/13/23 0859  Weight: 66.7 kg 66.7 kg   General exam: awake, alert, no acute distress HEENT: atraumatic, clear conjunctiva, anicteric sclera, moist mucus membranes, hearing grossly normal  Respiratory system: CTAB, no wheezes, rales or rhonchi, normal respiratory effort. Cardiovascular system: normal S1/S2, RRR.   Gastrointestinal system: soft, NT, ND, no HSM felt, +bowel sounds. Central nervous system: A&O x 3. no gross focal neurologic deficits, normal speech Extremities: LEFT BKA dressing clean dry intact,  no edema, normal tone Skin: dry, intact, normal temperature Psychiatry: normal mood, congruent affect, judgement and insight appear normal   Condition at discharge: stable  The results of significant diagnostics from this hospitalization (including imaging, microbiology, ancillary and laboratory) are listed below for reference.   Imaging Studies: [place] OR NERVE BLOCK-IMAGE ONLY [facility]) Result Date: 05/13/2023 There is no interpretation for this exam.  This order is for images obtained during a surgical procedure.  Please See "Surgeries" Tab for more information regarding the procedure.   PERIPHERAL VASCULAR CATHETERIZATION Result Date: 05/11/2023 See surgical note for result.  DG Foot Complete Left Result Date: 05/10/2023 CLINICAL DATA:  Left foot pain and gangrene EXAM: LEFT FOOT - COMPLETE 3 VIEW COMPARISON:  Left foot radiograph dated 03/06/2023 FINDINGS: Soft tissue thickening and irregularity of the great toe with underlying cortical irregularity of the great toe distal phalangeal tuft. The fourth toe distal phalanx appears to extend beyond the margin of the soft tissue. There is soft tissue ulceration of the distal small toe without definite underlying cortical lucency. Increased cortical thickening of the fourth metatarsal. Soft tissue thickening and lucencies are also seen at the plantar aspect of the foot. There is cortical irregularity and discontinuity along the lateral plantar aspect of the cuboid. IMPRESSION: 1. Soft tissue thickening and irregularity of the great toe with underlying cortical irregularity of the great toe distal phalangeal tuft, suspicious for osteomyelitis. 2. Soft tissue thickening with subcutaneous emphysema of the plantar foot, likely emphysematous infection. Cortical irregularity and discontinuity along the lateral plantar aspect of the cuboid, suspicious for osteomyelitis. 3. Exposed bony fourth distal phalanx. 4. Soft tissue ulceration of the distal small toe without  definite underlying cortical lucency. 5. Increased cortical thickening of the fourth metatarsal, which may be reactive or reflect osteomyelitis. Electronically Signed   By: Agustin Cree M.D.   On: 05/10/2023 22:05   VAS [place] ABI WITH/WO TBI Result Date: 04/20/2023  LOWER EXTREMITY DOPPLER STUDY Patient Name:  DARIONA POSTMA  Date of Exam:   04/19/2023 Medical Rec #: [id number]        Accession #:    [id number] Date of Birth: 05/03/1955        Patient Gender: F Patient Age:   75 years Exam Location:  [facility] Procedure:      VAS [place] ABI  WITH/WO TBI Referring Phys: --------------------------------------------------------------------------------  Indications: Claudication, and peripheral artery disease. High Risk Factors: Hypertension, hyperlipidemia.  Vascular Interventions: 02/2023 Mechanical thrombectomy of the left SFA,                         popliteal artery, and tibioperoneal trunk with the Rota                         Rex device                         5. Percutaneous transluminal angioplasty of the left                         peroneal artery and tibioperoneal trunk with 2.5 mm                         diameter angioplasty balloon                         6. Catheter directed thrombolytic therapy with 8 mg of                         tPA and placement of an infusion catheter for continued                         thrombolytic therapy with 135 cm total length 30 cm                         working catheter                          09/07/2022 PTA left CFA and tp trunk and stent to SFA,                         Pop, and EIA                          02/23/2021 Right SFA stent and tp trunk PTA                          06/17/20: Right SFA, popliteal & peroneal artery PTA;. Comparison Study: 09/29/2022 Performing Technologist: Salvadore Farber RVT  Examination Guidelines: A complete evaluation includes at minimum, Doppler waveform signals and systolic blood pressure reading at the level of bilateral brachial, anterior  tibial, and posterior tibial arteries, when vessel segments are accessible. Bilateral testing is considered an integral part of a complete examination. Photoelectric Plethysmograph (PPG) waveforms and toe systolic pressure readings are included as required and additional duplex testing as needed. Limited examinations for reoccurring indications may be performed as noted.  ABI Findings: +---------+------------------+-----+--------+---------+ Right    Rt Pressure (mmHg)IndexWaveformComment   +---------+------------------+-----+--------+---------+ Brachial 158                                      +---------+------------------+-----+--------+---------+ PTA      128               0.80 biphasic          +---------+------------------+-----+--------+---------+ DP  148               0.92 biphasic          +---------+------------------+-----+--------+---------+ Great Toe                               amputated +---------+------------------+-----+--------+---------+ +---------+------------------+-----+----------+-------+ Left     Lt Pressure (mmHg)IndexWaveform  Comment +---------+------------------+-----+----------+-------+ Brachial 160                                      +---------+------------------+-----+----------+-------+ PTA      150               0.94 monophasic        +---------+------------------+-----+----------+-------+ DP       153               0.96 monophasic        +---------+------------------+-----+----------+-------+ Great Toe87                0.54 Abnormal          +---------+------------------+-----+----------+-------+ +-------+-----------+-----------+------------+------------+ ABI/TBIToday's ABIToday's TBIPrevious ABIPrevious TBI +-------+-----------+-----------+------------+------------+ Right  .92        amp        .89         amp          +-------+-----------+-----------+------------+------------+ Left   .96        .54         .87         .59          +-------+-----------+-----------+------------+------------+ Bilateral ABIs appear increased compared to prior study on 09/2022.  Summary: Right: Resting right ankle-brachial index indicates mild right lower extremity arterial disease. Left: Resting left ankle-brachial index is within normal range. The left toe-brachial index is abnormal. *See table(s) above for measurements and observations.  Electronically signed by Festus Barren MD on 04/20/2023 at 12:56:07 PM.    Final     Microbiology: Results for orders placed or performed during the hospital encounter of 05/10/23  Culture, blood (routine x 2)     Status: None   Collection Time: 05/10/23  4:24 PM   Specimen: BLOOD  Result Value Ref Range Status   Specimen Description BLOOD BLOOD LEFT ARM  Final   Special Requests   Final    BOTTLES DRAWN AEROBIC AND ANAEROBIC Blood Culture results may not be optimal due to an inadequate volume of blood received in culture bottles   Culture   Final    NO GROWTH 5 DAYS Performed at [facility], [address] [place], [place] [address]    Report Status 05/15/2023 FINAL  Final  Culture, blood (routine x 2)     Status: None   Collection Time: 05/10/23  8:50 PM   Specimen: Left Antecubital; Blood  Result Value Ref Range Status   Specimen Description LEFT ANTECUBITAL  Final   Special Requests   Final    BOTTLES DRAWN AEROBIC AND ANAEROBIC Blood Culture adequate volume   Culture   Final    NO GROWTH 5 DAYS Performed at [facility], [address] [place], [place] [address]    Report Status 05/15/2023 FINAL  Final  Surgical PCR screen     Status: None   Collection Time: 05/12/23  9:39 PM   Specimen: Nasal Mucosa; Nasal Swab  Result Value Ref Range Status  MRSA, PCR NEGATIVE NEGATIVE Final   Staphylococcus aureus NEGATIVE NEGATIVE Final    Comment: (NOTE) The Xpert SA Assay (FDA approved for NASAL specimens in patients 66 years of age and older), is  one component of a comprehensive surveillance program. It is not intended to diagnose infection nor to guide or monitor treatment. Performed at [facility], [address] [place], [place] [address]     Labs: CBC: Recent Labs  Lab 05/14/23 [phone] 05/15/23 0032 05/16/23 4098 05/17/23 0553 05/17/23 1612 05/18/23 0544  WBC 7.5 9.1 18.4* 9.8 8.9 7.5  NEUTROABS 5.0 5.9 15.0* 7.2  --  5.0  HGB 7.3* 7.5* 7.6* 6.9* 8.6* 8.0*  HCT 22.6* 22.4* 23.1* 20.8* 25.1* 23.6*  MCV 94.6 93.3 93.1 92.9 89.3 91.8  PLT 245 256 309 317 372 356   Basic Metabolic Panel: Recent Labs  Lab 05/14/23 0842 05/15/23 0032 05/16/23 0623 05/17/23 0553 05/18/23 0544  NA 134* 131* 134* 133* 134*  K 4.1 3.8 3.6 3.5 3.6  CL 105 98 103 101 102  CO2 25 24 24 24 24   GLUCOSE 135* 133* 112* 107* 122*  BUN 11 11 12 13 11   CREATININE 0.67 0.66 0.68 0.66 0.51  CALCIUM 8.6* 8.4* 8.4* 8.2* 8.3*   Liver Function Tests: No results for input(s): "AST", "ALT", "ALKPHOS", "BILITOT", "PROT", "ALBUMIN" in the last 168 hours. CBG: No results for input(s): "GLUCAP" in the last 168 hours.  Discharge time spent: greater than 30 minutes.  Signed: Pennie Banter, DO [facility] 05/18/2023

## 2023-05-18 NOTE — TOC Transition Note (Signed)
Transition of Care [facility]) - Discharge Note   Patient Details  Name: Jackie Macias MRN: [id number] Date of Birth: [birth year]  [facility] CM/SW Contact:  Chapman Fitch, RN Phone Number: 05/18/2023, 3:12 PM   Clinical Narrative:      Attempted to arrange EMS transport.  Due to weather they are no longer accepting non emergent transports.  TOC leadership notified    Patient Goals and CMS Choice            Discharge Placement                       Discharge Plan and Services Additional resources added to the After Visit Summary for                                       Social Drivers of Health (SDOH) Interventions SDOH Screenings   Food Insecurity: No Food Insecurity (05/11/2023)  Housing: High Risk (05/11/2023)  Transportation Needs: No Transportation Needs (05/11/2023)  Utilities: Not At Risk (05/11/2023)  Depression (PHQ2-9): High Risk (04/29/2023)  Social Connections: Unknown (05/11/2023)  Tobacco Use: High Risk (05/13/2023)     Readmission Risk Interventions     No data to display

## 2023-05-18 NOTE — NC FL2 (Signed)
[place] MEDICAID FL2 LEVEL OF CARE FORM     IDENTIFICATION  Patient Name: Jackie Macias Birthdate: [birth year] Sex: female Admission Date (Current Location): 05/10/2023  [facility] and [place] Number:  Chiropodist and Address:         Provider Number: [id number]  Attending Physician Name and Address:  Pennie Banter, DO  Relative Name and Phone Number:       Current Level of Care: Hospital Recommended Level of Care: Skilled Nursing Facility Prior Approval Number:    Date Approved/Denied:   PASRR Number: [id number]  Discharge Plan:      Current Diagnoses: Patient Active Problem List   Diagnosis Date Noted   Foot infection 05/11/2023   Gangrene (HCC) 05/11/2023   Foot osteomyelitis, left (HCC) 05/10/2023   HLD (hyperlipidemia) 05/10/2023   Depression 05/10/2023   Osteomyelitis of left foot (HCC) 05/10/2023   Generalized weakness 03/14/2023   Hypokalemia 03/13/2023   Hyponatremia 03/13/2023   Lower extremity edema 03/13/2023   Obesity (BMI 30-39.9) 03/13/2023   Ischemic foot 03/07/2023   Left foot pain 03/07/2023   Acute lower limb ischemia 03/06/2023   Prediabetes 11/22/2022   Vitamin D deficiency, unspecified 11/22/2022   Atherosclerosis of native arteries of the extremities with ulceration (HCC) 08/12/2022   Claustrophobia 05/19/2021   Low back pain 10/21/2020   Atherosclerosis of native arteries of extremity with intermittent claudication (HCC) 07/28/2020   Enthesopathy of hip region 06/06/2020   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   Chronic obstructive pulmonary disease (COPD) (HCC) 01/17/2020   Parotid mass 10/05/2019   Carotid stenosis 10/23/2018   GERD (gastroesophageal reflux disease) 10/23/2018   Intracranial vascular stenosis 10/23/2018   Mixed hyperlipidemia 05/26/2018   Gout 03/20/2018   Essential hypertension 09/08/2017   Peripheral vascular disease (HCC) 09/08/2017   Status post THR (total hip replacement) 01/17/2017    Primary osteoarthritis of right hip 01/05/2017   Gastro-esophageal reflux disease with esophagitis, without bleeding 02/28/2012   Helicobacter pylori infection 08/17/2010   Iron deficiency anemia 08/17/2010   Polyarthritis, unspecified 08/17/2010   Tobacco use disorder 08/17/2010   History of colonic polyps 07/02/2010   Internal hemorrhoids 07/02/2010    Orientation RESPIRATION BLADDER Height & Weight     Self, Time, Situation, Place  Normal Continent Weight: 66.7 kg Height:  5\' 6"  (167.6 cm)  BEHAVIORAL SYMPTOMS/MOOD NEUROLOGICAL BOWEL NUTRITION STATUS      Continent Diet (Heart Healthy)  AMBULATORY STATUS COMMUNICATION OF NEEDS Skin   Extensive Assist Verbally Surgical wounds                       Personal Care Assistance Level of Assistance              Functional Limitations Info             SPECIAL CARE FACTORS FREQUENCY  PT (By licensed PT), OT (By licensed OT)                    Contractures Contractures Info: Not present    Additional Factors Info  Code Status, Allergies Code Status Info: full Allergies Info: lisinopril-hydrochlorothiazide           Current Medications (05/18/2023):  This is the current hospital active medication list Current Facility-Administered Medications  Medication Dose Route Frequency Provider Last Rate Last Admin   acetaminophen (TYLENOL) tablet 650 mg  650 mg Oral Q6H PRN Schnier, Latina Craver, MD       albuterol (PROVENTIL) (  2.5 MG/3ML) 0.083% nebulizer solution 2.5 mg  2.5 mg Inhalation Q4H PRN Schnier, Latina Craver, MD       amLODipine (NORVASC) tablet 5 mg  5 mg Oral Daily Schnier, Latina Craver, MD   5 mg at 05/18/23 5956   aspirin EC tablet 81 mg  81 mg Oral Daily Renford Dills, MD   81 mg at 05/18/23 3875   clopidogrel (PLAVIX) tablet 75 mg  75 mg Oral Daily Rosezetta Schlatter T, MD   75 mg at 05/18/23 6433   dextromethorphan-guaiFENesin (MUCINEX DM) 30-600 MG per 12 hr tablet 1 tablet  1 tablet Oral BID PRN Schnier,  Latina Craver, MD       docusate sodium (COLACE) capsule 100 mg  100 mg Oral BID Rosezetta Schlatter T, MD   100 mg at 05/18/23 2951   DULoxetine (CYMBALTA) DR capsule 30 mg  30 mg Oral Daily Schnier, Latina Craver, MD   30 mg at 05/18/23 8841   ferrous sulfate tablet 325 mg  325 mg Oral Q breakfast Schnier, Latina Craver, MD   325 mg at 05/18/23 6606   gabapentin (NEURONTIN) capsule 600 mg  600 mg Oral Daily Schnier, Latina Craver, MD   600 mg at 05/18/23 3016   hydrALAZINE (APRESOLINE) injection 5 mg  5 mg Intravenous Q2H PRN Schnier, Latina Craver, MD       morphine (PF) 2 MG/ML injection 2-4 mg  2-4 mg Intravenous Q3H PRN Renford Dills, MD   2 mg at 05/15/23 2214   nicotine (NICODERM CQ - dosed in mg/24 hours) patch 21 mg  21 mg Transdermal Daily Schnier, Latina Craver, MD   21 mg at 05/18/23 0929   ondansetron (ZOFRAN) injection 4 mg  4 mg Intravenous Q8H PRN Schnier, Latina Craver, MD   4 mg at 05/16/23 1050   oxyCODONE-acetaminophen (PERCOCET/ROXICET) 5-325 MG per tablet 1-2 tablet  1-2 tablet Oral Q4H PRN Schnier, Latina Craver, MD   2 tablet at 05/18/23 1118   pantoprazole (PROTONIX) EC tablet 40 mg  40 mg Oral q morning Schnier, Latina Craver, MD   40 mg at 05/18/23 0109   polyethylene glycol (MIRALAX / GLYCOLAX) packet 17 g  17 g Oral Daily PRN Schnier, Latina Craver, MD       rosuvastatin (CRESTOR) tablet 20 mg  20 mg Oral Daily Schnier, Latina Craver, MD   20 mg at 05/18/23 3235     Discharge Medications: Please see discharge summary for a list of discharge medications.  Relevant Imaging Results:  Relevant Lab Results:   Additional Information ss [id number]  Chapman Fitch, RN

## 2023-05-18 NOTE — TOC Progression Note (Signed)
Transition of Care [facility]) - Progression Note    Patient Details  Name: Jackie Macias MRN: [id number] Date of Birth: 23-Oct-1955  Transition of Care [facility]) CM/SW Contact  Chapman Fitch, RN Phone Number: 05/18/2023, 2:55 PM  Clinical Narrative:     Bed offers presented.  Patient accepts bed at [facility] Accepted in HUB and notified in HUB Tam[facility] at [facility]  Vesta Mixer obtained        Expected Discharge Plan and Services                                               Social Determinants of Health (SDOH) Interventions SDOH Screenings   Food Insecurity: No Food Insecurity (05/11/2023)  Housing: High Risk (05/11/2023)  Transportation Needs: No Transportation Needs (05/11/2023)  Utilities: Not At Risk (05/11/2023)  Depression (PHQ2-9): High Risk (04/29/2023)  Social Connections: Unknown (05/11/2023)  Tobacco Use: High Risk (05/13/2023)    Readmission Risk Interventions     No data to display

## 2023-05-19 LAB — CBC
HCT: 25.9 % — ABNORMAL LOW (ref 36.0–46.0)
Hemoglobin: 8.6 g/dL — ABNORMAL LOW (ref 12.0–15.0)
MCH: 31.3 pg (ref 26.0–34.0)
MCHC: 33.2 g/dL (ref 30.0–36.0)
MCV: 94.2 fL (ref 80.0–100.0)
Platelets: 408 10*3/uL — ABNORMAL HIGH (ref 150–400)
RBC: 2.75 MIL/uL — ABNORMAL LOW (ref 3.87–5.11)
RDW: 15 % (ref 11.5–15.5)
WBC: 6.8 10*3/uL (ref 4.0–10.5)
nRBC: 0 % (ref 0.0–0.2)

## 2023-05-19 NOTE — TOC Transition Note (Signed)
Transition of Care [facility]) - Discharge Note   Patient Details  Name: Jackie Macias MRN: [id number] Date of Birth: [birth year]  [facility] Care [facility]) CM/SW Contact:  Chapman Fitch, RN Phone Number: 05/19/2023, 9:33 AM   Clinical Narrative:     Patient will DC to: [facility]  Anticipated DC date: 05/19/23  Family notified: patient to notify family Transport by: Wendie Simmer  Per MD patient ready for DC to . RN, patient,, and facility notified of DC. Discharge Summary sent to facility. RN given number for report. DC packet on chart. Ambulance transport requested for patient.  TOC signing off.         Patient Goals and CMS Choice            Discharge Placement                       Discharge Plan and Services Additional resources added to the After Visit Summary for                                       Social Drivers of Health (SDOH) Interventions SDOH Screenings   Food Insecurity: No Food Insecurity (05/11/2023)  Housing: High Risk (05/11/2023)  Transportation Needs: No Transportation Needs (05/11/2023)  Utilities: Not At Risk (05/11/2023)  Depression (PHQ2-9): High Risk (04/29/2023)  Social Connections: Unknown (05/11/2023)  Tobacco Use: High Risk (05/13/2023)     Readmission Risk Interventions     No data to display

## 2023-05-19 NOTE — Plan of Care (Signed)

## 2023-05-19 NOTE — Progress Notes (Signed)
Report called to receiving facility SBAR followed, questions asked and answered

## 2023-05-20 DIAGNOSIS — R2681 Unsteadiness on feet: Secondary | ICD-10-CM | POA: Diagnosis not present

## 2023-05-20 DIAGNOSIS — J4489 Other specified chronic obstructive pulmonary disease: Secondary | ICD-10-CM | POA: Diagnosis not present

## 2023-05-20 DIAGNOSIS — M6259 Muscle wasting and atrophy, not elsewhere classified, multiple sites: Secondary | ICD-10-CM | POA: Diagnosis not present

## 2023-05-20 DIAGNOSIS — D62 Acute posthemorrhagic anemia: Secondary | ICD-10-CM | POA: Diagnosis not present

## 2023-05-20 DIAGNOSIS — I739 Peripheral vascular disease, unspecified: Secondary | ICD-10-CM | POA: Diagnosis not present

## 2023-05-20 DIAGNOSIS — F17218 Nicotine dependence, cigarettes, with other nicotine-induced disorders: Secondary | ICD-10-CM | POA: Diagnosis not present

## 2023-05-20 DIAGNOSIS — Z4781 Encounter for orthopedic aftercare following surgical amputation: Secondary | ICD-10-CM | POA: Diagnosis not present

## 2023-05-20 DIAGNOSIS — M86172 Other acute osteomyelitis, left ankle and foot: Secondary | ICD-10-CM | POA: Diagnosis not present

## 2023-05-24 DIAGNOSIS — M6259 Muscle wasting and atrophy, not elsewhere classified, multiple sites: Secondary | ICD-10-CM | POA: Diagnosis not present

## 2023-05-24 DIAGNOSIS — R2681 Unsteadiness on feet: Secondary | ICD-10-CM | POA: Diagnosis not present

## 2023-05-26 DIAGNOSIS — J4489 Other specified chronic obstructive pulmonary disease: Secondary | ICD-10-CM | POA: Diagnosis not present

## 2023-05-26 DIAGNOSIS — F17218 Nicotine dependence, cigarettes, with other nicotine-induced disorders: Secondary | ICD-10-CM | POA: Diagnosis not present

## 2023-05-26 DIAGNOSIS — I739 Peripheral vascular disease, unspecified: Secondary | ICD-10-CM | POA: Diagnosis not present

## 2023-05-26 DIAGNOSIS — M86172 Other acute osteomyelitis, left ankle and foot: Secondary | ICD-10-CM | POA: Diagnosis not present

## 2023-05-26 DIAGNOSIS — D62 Acute posthemorrhagic anemia: Secondary | ICD-10-CM | POA: Diagnosis not present

## 2023-05-31 ENCOUNTER — Ambulatory Visit (INDEPENDENT_AMBULATORY_CARE_PROVIDER_SITE_OTHER): Payer: Medicare HMO | Admitting: Nurse Practitioner

## 2023-06-01 ENCOUNTER — Other Ambulatory Visit (INDEPENDENT_AMBULATORY_CARE_PROVIDER_SITE_OTHER): Payer: Self-pay | Admitting: Nurse Practitioner

## 2023-06-01 ENCOUNTER — Telehealth (INDEPENDENT_AMBULATORY_CARE_PROVIDER_SITE_OTHER): Payer: Self-pay

## 2023-06-01 MED ORDER — OXYCODONE-ACETAMINOPHEN 5-325 MG PO TABS
1.0000 | ORAL_TABLET | ORAL | 0 refills | Status: DC | PRN
Start: 2023-06-01 — End: 2023-06-27

## 2023-06-01 MED ORDER — OXYCODONE-ACETAMINOPHEN 5-325 MG PO TABS: 1.0000 | ORAL_TABLET | ORAL | 0 refills | Status: DC | PRN

## 2023-06-01 NOTE — Telephone Encounter (Signed)
 I have sent in a refill, however if she does not show for her appt on 06/02/23, she will not receive a pain medication refill until she has been seen.

## 2023-06-01 NOTE — Telephone Encounter (Signed)
 Jackie Macias is currently in pain and called for refill on Oxycodone 5-325. She stated she received it from the hospital after surgery. Patient missed her appt from 05/31/23 but has rescheduled for 06/02/23.   Please advise

## 2023-06-01 NOTE — Telephone Encounter (Signed)
 Left detailed VM.

## 2023-06-01 NOTE — Progress Notes (Signed)
 Patient ID: Jackie Macias, female   DOB: July 01, 1955, 68 y.o.   MRN: [id number]  No chief complaint on file.   HPI Jackie Macias is a 68 y.o. female.    Procedure 05/13/2023: Left below-the-knee amputation   She denies pain at her amputation site.  She is anxious to obtain a prosthesis so she can stand and walk again.   Past Medical History:  Diagnosis Date   Arthritis    Bronchitis    COPD (chronic obstructive pulmonary disease) (HCC)    GERD (gastroesophageal reflux disease)    Gout    Hypertension     Past Surgical History:  Procedure Laterality Date   AMPUTATION Right 07/29/2017   Procedure: AMPUTATION DIGIT/ 40981;  Surgeon: Linus Galas, DPM;  Location: [facility];  Service: Podiatry;  Laterality: Right;   AMPUTATION Left 05/13/2023   Procedure: AMPUTATION BELOW KNEE;  Surgeon: Renford Dills, MD;  Location: [facility];  Service: Vascular;  Laterality: Left;   AMPUTATION TOE Right 02/18/2021   Procedure: AMPUTATION TOE-GREAT TOE;  Surgeon: Gwyneth Revels, DPM;  Location: [facility];  Service: Podiatry;  Laterality: Right;   CHOLECYSTECTOMY     facial biopsy Right    JOINT REPLACEMENT Right 12/2016   THR   LOWER EXTREMITY ANGIOGRAPHY Right 09/27/2017   Procedure: LOWER EXTREMITY ANGIOGRAPHY;  Surgeon: Renford Dills, MD;  Location: [facility];  Service: Cardiovascular;  Laterality: Right;   LOWER EXTREMITY ANGIOGRAPHY Right 06/17/2020   Procedure: LOWER EXTREMITY ANGIOGRAPHY;  Surgeon: Renford Dills, MD;  Location: [facility];  Service: Cardiovascular;  Laterality: Right;   LOWER EXTREMITY ANGIOGRAPHY Right 02/18/2021   Procedure: LOWER EXTREMITY ANGIOGRAPHY;  Surgeon: Renford Dills, MD;  Location: [facility];  Service: Cardiovascular;  Laterality: Right;   LOWER EXTREMITY ANGIOGRAPHY Left 09/07/2022   Procedure: Lower Extremity Angiography;  Surgeon: Renford Dills, MD;  Location: [facility];  Service:  Cardiovascular;  Laterality: Left;   LOWER EXTREMITY ANGIOGRAPHY Left 03/07/2023   Procedure: Lower Extremity Angiography;  Surgeon: Annice Needy, MD;  Location: [facility];  Service: Cardiovascular;  Laterality: Left;   LOWER EXTREMITY ANGIOGRAPHY Left 03/08/2023   Procedure: Lower Extremity Angiography;  Surgeon: Renford Dills, MD;  Location: [facility];  Service: Cardiovascular;  Laterality: Left;   LOWER EXTREMITY ANGIOGRAPHY Left 05/11/2023   Procedure: Lower Extremity Angiography;  Surgeon: Renford Dills, MD;  Location: [facility];  Service: Cardiovascular;  Laterality: Left;   TONSILLECTOMY     TOTAL HIP ARTHROPLASTY Right 01/17/2017   Procedure: TOTAL HIP ARTHROPLASTY ANTERIOR APPROACH;  Surgeon: Lyndle Herrlich, MD;  Location: [facility];  Service: Orthopedics;  Laterality: Right;      Allergies  Allergen Reactions   Lisinopril-Hydrochlorothiazide Other (See Comments)    Current Outpatient Medications  Medication Sig Dispense Refill   acetaminophen (TYLENOL) 325 MG tablet Take 2 tablets (650 mg total) by mouth every 6 (six) hours as needed for mild pain (pain score 1-3) (or Fever >/= 101).     albuterol (VENTOLIN HFA) 108 (90 Base) MCG/ACT inhaler Inhale 2 puffs into the lungs every 6 (six) hours as needed for wheezing or shortness of breath. 18 g 6   amLODipine (NORVASC) 5 MG tablet Take 1 tablet (5 mg total) by mouth daily.     aspirin EC 81 MG tablet Take 1 tablet (81 mg total) by mouth daily. Swallow whole. 30 tablet 0  clopidogrel (PLAVIX) 75 MG tablet Take 1 tablet (75 mg total) by mouth daily. 30 tablet 0   dextromethorphan-guaiFENesin (MUCINEX DM) 30-600 MG 12hr tablet Take 1 tablet by mouth 2 (two) times daily as needed for cough.     docusate sodium (COLACE) 100 MG capsule Take 1 capsule (100 mg total) by mouth 2 (two) times daily.     DULoxetine (CYMBALTA) 30 MG capsule Take 1 capsule (30 mg total) by mouth daily. 30 capsule 2    ferrous sulfate 325 (65 FE) MG tablet Take 1 tablet (325 mg total) by mouth daily with breakfast. 30 tablet 0   furosemide (LASIX) 20 MG tablet Take 1 tablet (20 mg total) by mouth daily as needed for edema. 30 tablet 0   gabapentin (NEURONTIN) 600 MG tablet Take 1 tablet (600 mg total) by mouth daily. 30 tablet 2   ipratropium-albuterol (DUONEB) 0.5-2.5 (3) MG/3ML SOLN Take 3 mLs by nebulization every 6 (six) hours as needed. 360 mL 3   nicotine (NICODERM CQ - DOSED IN MG/24 HOURS) 21 mg/24hr patch Place 1 patch (21 mg total) onto the skin daily.     oxyCODONE-acetaminophen (PERCOCET/ROXICET) 5-325 MG tablet Take 1-2 tablets by mouth every 4 (four) hours as needed for moderate pain (pain score 4-6) (and severe pain). 30 tablet 0   pantoprazole (PROTONIX) 40 MG tablet Take 1 tablet (40 mg total) by mouth every morning. 90 tablet 3   polyethylene glycol (MIRALAX / GLYCOLAX) 17 g packet Take 17 g by mouth daily as needed. 30 each 0   rosuvastatin (CRESTOR) 20 MG tablet Take 1 tablet (20 mg total) by mouth daily. 90 tablet 3   Vitamin D, Ergocalciferol, (DRISDOL) 1.25 MG (50000 UNIT) CAPS capsule Take 1 capsule (50,000 Units total) by mouth every 7 (seven) days. 12 capsule 3   No current facility-administered medications for this visit.        Physical Exam There were no vitals taken for this visit. Gen:  WD/WN, NAD Skin: incision C/D/I with a small area of epidermal lysis in the anterior portion this appears very superficial     Assessment/Plan:  1. Atherosclerosis of native arteries of the extremities with ulceration (HCC) (Primary) Staples are removed.  She is doing well.  She will follow-up in 2 months.  I will contact [employer] to move forward with a prosthesis.      Levora Dredge 06/01/2023, 10:54 AM   This note was created with Dragon medical transcription system.  Any errors from dictation are unintentional.

## 2023-06-02 ENCOUNTER — Encounter (INDEPENDENT_AMBULATORY_CARE_PROVIDER_SITE_OTHER): Payer: Self-pay | Admitting: Vascular Surgery

## 2023-06-02 ENCOUNTER — Ambulatory Visit (INDEPENDENT_AMBULATORY_CARE_PROVIDER_SITE_OTHER): Admitting: Vascular Surgery

## 2023-06-02 VITALS — BP 140/72 | HR 64 | Resp 18 | Ht 66.0 in | Wt 147.0 lb

## 2023-06-02 DIAGNOSIS — I7025 Atherosclerosis of native arteries of other extremities with ulceration: Secondary | ICD-10-CM

## 2023-06-03 ENCOUNTER — Ambulatory Visit: Payer: Medicare HMO | Admitting: Internal Medicine

## 2023-06-04 ENCOUNTER — Encounter (INDEPENDENT_AMBULATORY_CARE_PROVIDER_SITE_OTHER): Payer: Self-pay | Admitting: Vascular Surgery

## 2023-06-24 ENCOUNTER — Telehealth (INDEPENDENT_AMBULATORY_CARE_PROVIDER_SITE_OTHER): Payer: Self-pay

## 2023-06-24 NOTE — Telephone Encounter (Signed)
 Patient left a message requesting pain medication to help with relief for leg pain. Please Advise

## 2023-06-27 ENCOUNTER — Other Ambulatory Visit (INDEPENDENT_AMBULATORY_CARE_PROVIDER_SITE_OTHER): Payer: Self-pay | Admitting: Nurse Practitioner

## 2023-06-27 MED ORDER — OXYCODONE-ACETAMINOPHEN 5-325 MG PO TABS
1.0000 | ORAL_TABLET | Freq: Four times a day (QID) | ORAL | 0 refills | Status: DC | PRN
Start: 2023-06-27 — End: 2023-07-22

## 2023-06-27 NOTE — Telephone Encounter (Signed)
 Call but no answer. I will call again to give the message.

## 2023-06-27 NOTE — Telephone Encounter (Signed)
 Left a voicemail.

## 2023-06-27 NOTE — Telephone Encounter (Signed)
 Ms Geerdes just called stating she missed a call from [place]. She is calling back to talk to someone. States she also wants to make sure we have her correct pharmacy: [facility] on [address].

## 2023-06-27 NOTE — Telephone Encounter (Signed)
 I have sent in pain medication.  However, typically we do not provide long-term pain management post amputation.  She continues to have issues we may need to consider pain management referral

## 2023-06-27 NOTE — Telephone Encounter (Signed)
 Merrily Pew requesting pain medication. She stated that she has tried Tylenol and it's not helping.   Please advise

## 2023-07-14 DIAGNOSIS — I10 Essential (primary) hypertension: Secondary | ICD-10-CM | POA: Diagnosis not present

## 2023-07-14 DIAGNOSIS — N3946 Mixed incontinence: Secondary | ICD-10-CM | POA: Diagnosis not present

## 2023-07-14 DIAGNOSIS — N3281 Overactive bladder: Secondary | ICD-10-CM | POA: Diagnosis not present

## 2023-07-20 ENCOUNTER — Telehealth (INDEPENDENT_AMBULATORY_CARE_PROVIDER_SITE_OTHER): Payer: Self-pay

## 2023-07-20 NOTE — Telephone Encounter (Signed)
 Left messages on both phone numbers for a call back to be seen this afternoon.

## 2023-07-20 NOTE — Telephone Encounter (Signed)
 Put her on this afternoon to see brien

## 2023-07-20 NOTE — Telephone Encounter (Signed)
 Spoke with patient and the earliest she can get into the office is Friday due to transportation. I scheduled for Friday at 1:00 pm.

## 2023-07-22 ENCOUNTER — Ambulatory Visit (INDEPENDENT_AMBULATORY_CARE_PROVIDER_SITE_OTHER): Admitting: Vascular Surgery

## 2023-07-22 ENCOUNTER — Encounter (INDEPENDENT_AMBULATORY_CARE_PROVIDER_SITE_OTHER): Payer: Self-pay | Admitting: Vascular Surgery

## 2023-07-22 ENCOUNTER — Other Ambulatory Visit (INDEPENDENT_AMBULATORY_CARE_PROVIDER_SITE_OTHER): Payer: Self-pay | Admitting: Vascular Surgery

## 2023-07-22 VITALS — BP 138/75 | HR 60 | Resp 18

## 2023-07-22 DIAGNOSIS — T8149XA Infection following a procedure, other surgical site, initial encounter: Secondary | ICD-10-CM | POA: Diagnosis not present

## 2023-07-22 DIAGNOSIS — T879 Unspecified complications of amputation stump: Secondary | ICD-10-CM

## 2023-07-22 MED ORDER — SULFAMETHOXAZOLE-TRIMETHOPRIM 800-160 MG PO TABS: 1.0000 | ORAL_TABLET | Freq: Two times a day (BID) | ORAL | 0 refills | Status: DC

## 2023-07-22 MED ORDER — OXYCODONE HCL 5 MG PO TABS: 5.0000 mg | ORAL_TABLET | Freq: Four times a day (QID) | ORAL | 0 refills | Status: DC | PRN

## 2023-07-22 MED ORDER — CLOPIDOGREL BISULFATE 75 MG PO TABS: 75.0000 mg | ORAL_TABLET | Freq: Every day | ORAL | 6 refills | Status: DC

## 2023-07-22 NOTE — Progress Notes (Signed)
 Subjective:    Patient ID: Jackie FERRAIOLO, female    DOB: 31-Mar-1955, 68 y.o.   MRN: [id number] Chief Complaint  Patient presents with   Follow-up    Pt states that her left leg is feeling sore and has pus draining for the last couple of days. Pt states there is no odor the drainage is a grayish color. **Per pt - no transportation until Friday**  8 weeks no studies      Jackie Macias is a 68 year old female who had a left below the knee amputation done on 05/13/2023.  She has been seen in prior visits and had all her staples removed.  Today she comes to clinic with 3 small areas across the incision line that have opened up/dehisced with some scabbing.  Patient states this started about a week ago.  She also has a noted area of scabbing and opening to the skin on her left knee.  We did not operate on this area but it is evident and she states that she has had some fluid out of this area.  She presents to clinic today for a wound check with endorsement of pain to the area.    Review of Systems  Constitutional: Negative.   Respiratory: Negative.    Cardiovascular: Negative.   Gastrointestinal: Negative.   Genitourinary: Negative.   Musculoskeletal: Negative.        Left BKA  Skin:  Positive for wound.  Neurological: Negative.   Psychiatric/Behavioral: Negative.    All other systems reviewed and are negative.      Objective:   Physical Exam Vitals reviewed.  Constitutional:      Appearance: Normal appearance.  Eyes:     Pupils: Pupils are equal, round, and reactive to light.  Cardiovascular:     Rate and Rhythm: Normal rate and regular rhythm.  Pulmonary:     Effort: Pulmonary effort is normal.     Breath sounds: Normal breath sounds.  Abdominal:     General: Abdomen is flat. Bowel sounds are normal.     Palpations: Abdomen is soft.  Musculoskeletal:        General: Normal range of motion.     Comments: Left BKA  Skin:    General: Skin is warm and dry.     Comments:  Incisional dehiscence X 3 areas with scabbing. Noted knee wound/scab  Neurological:     General: No focal deficit present.     Mental Status: She is alert and oriented to person, place, and time.  Psychiatric:        Mood and Affect: Mood normal.        Behavior: Behavior normal.        Thought Content: Thought content normal.        Judgment: Judgment normal.     BP 138/75   Pulse 60   Resp 18   Past Medical History:  Diagnosis Date   Arthritis    Bronchitis    COPD (chronic obstructive pulmonary disease) (HCC)    GERD (gastroesophageal reflux disease)    Gout    Hypertension     Social History   Socioeconomic History   Marital status: Single    Spouse name: Not on file   Number of children: Not on file   Years of education: Not on file   Highest education level: Not on file  Occupational History   Not on file  Tobacco Use   Smoking status: Every Day    Current  packs/day: 1.00    Average packs/day: 1 pack/day for 50.0 years (50.0 ttl pk-yrs)    Types: Cigarettes   Smokeless tobacco: Never  Vaping Use   Vaping status: Never Used  Substance and Sexual Activity   Alcohol use: Yes    Alcohol/week: 3.0 standard drinks of alcohol    Types: 3 Cans of beer per week    Comment: occasional   Drug use: No   Sexual activity: Not on file  Other Topics Concern   Not on file  Social History Narrative   Not on file   Social Drivers of Health   Financial Resource Strain: Not on file  Food Insecurity: No Food Insecurity (05/11/2023)   Hunger Vital Sign    Worried About Running Out of Food in the Last Year: Never true    Ran Out of Food in the Last Year: Never true  Transportation Needs: No Transportation Needs (05/11/2023)   PRAPARE - Administrator, Civil Service (Medical): No    Lack of Transportation (Non-Medical): No  Physical Activity: Not on file  Stress: Not on file  Social Connections: Unknown (05/11/2023)   Social Connection and Isolation Panel  [NHANES]    Frequency of Communication with Friends and Family: Patient unable to answer    Frequency of Social Gatherings with Friends and Family: Patient unable to answer    Attends Religious Services: Patient declined    Active Member of Clubs or Organizations: Patient declined    Attends Banker Meetings: Patient declined    Marital Status: Patient declined  Intimate Partner Violence: Not At Risk (05/11/2023)   Humiliation, Afraid, Rape, and Kick questionnaire    Fear of Current or Ex-Partner: No    Emotionally Abused: No    Physically Abused: No    Sexually Abused: No    Past Surgical History:  Procedure Laterality Date   AMPUTATION Right 07/29/2017   Procedure: AMPUTATION DIGIT/ 28825;  Surgeon: Angel Barba, DPM;  Location: [facility];  Service: Podiatry;  Laterality: Right;   AMPUTATION Left 05/13/2023   Procedure: AMPUTATION BELOW KNEE;  Surgeon: Jackquelyn Mass, MD;  Location: [facility];  Service: Vascular;  Laterality: Left;   AMPUTATION TOE Right 02/18/2021   Procedure: AMPUTATION TOE-GREAT TOE;  Surgeon: Anell Baptist, DPM;  Location: [facility];  Service: Podiatry;  Laterality: Right;   CHOLECYSTECTOMY     facial biopsy Right    JOINT REPLACEMENT Right 12/2016   THR   LOWER EXTREMITY ANGIOGRAPHY Right 09/27/2017   Procedure: LOWER EXTREMITY ANGIOGRAPHY;  Surgeon: Jackquelyn Mass, MD;  Location: [facility];  Service: Cardiovascular;  Laterality: Right;   LOWER EXTREMITY ANGIOGRAPHY Right 06/17/2020   Procedure: LOWER EXTREMITY ANGIOGRAPHY;  Surgeon: Jackquelyn Mass, MD;  Location: [facility];  Service: Cardiovascular;  Laterality: Right;   LOWER EXTREMITY ANGIOGRAPHY Right 02/18/2021   Procedure: LOWER EXTREMITY ANGIOGRAPHY;  Surgeon: Jackquelyn Mass, MD;  Location: [facility];  Service: Cardiovascular;  Laterality: Right;   LOWER EXTREMITY ANGIOGRAPHY Left 09/07/2022   Procedure: Lower Extremity Angiography;  Surgeon: Jackquelyn Mass, MD;  Location: [facility];  Service: Cardiovascular;  Laterality: Left;   LOWER EXTREMITY ANGIOGRAPHY Left 03/07/2023   Procedure: Lower Extremity Angiography;  Surgeon: Celso College, MD;  Location: [facility];  Service: Cardiovascular;  Laterality: Left;   LOWER EXTREMITY ANGIOGRAPHY Left 03/08/2023   Procedure: Lower Extremity Angiography;  Surgeon: Jackquelyn Mass, MD;  Location:  [facility];  Service: Cardiovascular;  Laterality: Left;   LOWER EXTREMITY ANGIOGRAPHY Left 05/11/2023   Procedure: Lower Extremity Angiography;  Surgeon: Jackquelyn Mass, MD;  Location: [facility];  Service: Cardiovascular;  Laterality: Left;   TONSILLECTOMY     TOTAL HIP ARTHROPLASTY Right 01/17/2017   Procedure: TOTAL HIP ARTHROPLASTY ANTERIOR APPROACH;  Surgeon: Jerlyn Moons, MD;  Location: [facility];  Service: Orthopedics;  Laterality: Right;    Family History  Problem Relation Age of Onset   Breast cancer Sister 49   Breast cancer Other     Allergies  Allergen Reactions   Lisinopril-Hydrochlorothiazide Other (See Comments)       Latest Ref Rng & Units 05/19/2023    5:45 AM 05/18/2023    5:44 AM 05/17/2023    4:12 PM  CBC  WBC 4.0 - 10.5 K/uL 6.8  7.5  8.9   Hemoglobin 12.0 - 15.0 g/dL 8.6  8.0  8.6   Hematocrit 36.0 - 46.0 % 25.9  23.6  25.1   Platelets 150 - 400 K/uL 408  356  372       CMP     Component Value Date/Time   NA 134 (L) 05/18/2023 0544   NA 138 11/22/2022 1042   K 3.6 05/18/2023 0544   CL 102 05/18/2023 0544   CO2 24 05/18/2023 0544   GLUCOSE 122 (H) 05/18/2023 0544   BUN 11 05/18/2023 0544   BUN 18 11/22/2022 1042   CREATININE 0.51 05/18/2023 0544   CALCIUM  8.3 (L) 05/18/2023 0544   PROT 8.8 (H) 05/10/2023 1624   PROT 7.1 11/22/2022 1042   ALBUMIN 3.3 (L) 05/10/2023 1624   ALBUMIN 4.1 11/22/2022 1042   AST 20 05/10/2023 1624   ALT 11 05/10/2023 1624   ALKPHOS 64 05/10/2023 1624   BILITOT 0.9 05/10/2023 1624    BILITOT 0.3 11/22/2022 1042   EGFR 94 11/22/2022 1042   GFRNONAA >60 05/18/2023 0544     No results found.     Assessment & Plan:   1. BKA stump complication (HCC) (Primary) Patient presents with 3 small areas along the incision line from her prior BKA that have opened up/dehisced.  These areas noted to have scabbing as well.  Open area on the medial side of the incision had a scab that I removed.  Culture was taken from all 3 areas and sent off today.  This area had a slight odor to it therefore I treated her for MRSA of wounds.  I was unable to express any drainage/pus from the areas.  Areas were covered with Xeroform then gauze then wrapped in a Kerlix.  Patient was instructed she needed to change her dressing particularly the Xeroform every day.  Was also instructed that she would be given an antibiotic for the next 10 days that she would need to take.  Do not skip any of these medications.  Patient requested a refill on her pain medication as her stump was painful claiming it is difficult to sleep so therefore refilled her pain medicine today. Oxycodone  5 mg every 6 hours as needed.  No refills.  - sulfamethoxazole -trimethoprim  (BACTRIM  DS) 800-160 MG tablet; Take 1 tablet by mouth 2 (two) times daily.  Dispense: 20 tablet; Refill: 0  Patient instructed to follow-up next Thursday for recheck of wound.   Current Outpatient Medications on File Prior to Visit  Medication Sig Dispense Refill   acetaminophen  (TYLENOL ) 325 MG tablet Take 2 tablets (650 mg total)  by mouth every 6 (six) hours as needed for mild pain (pain score 1-3) (or Fever >/= 101).     albuterol  (VENTOLIN  HFA) 108 (90 Base) MCG/ACT inhaler Inhale 2 puffs into the lungs every 6 (six) hours as needed for wheezing or shortness of breath. 18 g 6   amLODipine  (NORVASC ) 5 MG tablet Take 1 tablet (5 mg total) by mouth daily.     aspirin  EC 81 MG tablet Take 1 tablet (81 mg total) by mouth daily. Swallow whole. 30 tablet 0    clopidogrel  (PLAVIX ) 75 MG tablet Take 1 tablet (75 mg total) by mouth daily. 30 tablet 0   dextromethorphan-guaiFENesin  (MUCINEX  DM) 30-600 MG 12hr tablet Take 1 tablet by mouth 2 (two) times daily as needed for cough.     docusate sodium  (COLACE) 100 MG capsule Take 1 capsule (100 mg total) by mouth 2 (two) times daily.     DULoxetine  (CYMBALTA ) 30 MG capsule Take 1 capsule (30 mg total) by mouth daily. 30 capsule 2   ferrous sulfate  325 (65 FE) MG tablet Take 1 tablet (325 mg total) by mouth daily with breakfast. 30 tablet 0   furosemide  (LASIX ) 20 MG tablet Take 1 tablet (20 mg total) by mouth daily as needed for edema. 30 tablet 0   gabapentin  (NEURONTIN ) 600 MG tablet Take 1 tablet (600 mg total) by mouth daily. 30 tablet 2   ipratropium-albuterol  (DUONEB) 0.5-2.5 (3) MG/3ML SOLN Take 3 mLs by nebulization every 6 (six) hours as needed. 360 mL 3   nicotine  (NICODERM CQ  - DOSED IN MG/24 HOURS) 21 mg/24hr patch Place 1 patch (21 mg total) onto the skin daily.     pantoprazole  (PROTONIX ) 40 MG tablet Take 1 tablet (40 mg total) by mouth every morning. 90 tablet 3   polyethylene glycol (MIRALAX  / GLYCOLAX ) 17 g packet Take 17 g by mouth daily as needed. 30 each 0   rosuvastatin  (CRESTOR ) 20 MG tablet Take 1 tablet (20 mg total) by mouth daily. 90 tablet 3   Vitamin D , Ergocalciferol , (DRISDOL ) 1.25 MG (50000 UNIT) CAPS capsule Take 1 capsule (50,000 Units total) by mouth every 7 (seven) days. 12 capsule 3   No current facility-administered medications on file prior to visit.    There are no Patient Instructions on file for this visit. No follow-ups on file.   Annamaria Barrette, NP

## 2023-07-25 ENCOUNTER — Ambulatory Visit (INDEPENDENT_AMBULATORY_CARE_PROVIDER_SITE_OTHER): Admitting: Vascular Surgery

## 2023-07-28 ENCOUNTER — Ambulatory Visit (INDEPENDENT_AMBULATORY_CARE_PROVIDER_SITE_OTHER): Admitting: Vascular Surgery

## 2023-07-28 LAB — AEROBIC CULTURE

## 2023-08-01 ENCOUNTER — Telehealth (INDEPENDENT_AMBULATORY_CARE_PROVIDER_SITE_OTHER): Payer: Self-pay

## 2023-08-01 ENCOUNTER — Ambulatory Visit (INDEPENDENT_AMBULATORY_CARE_PROVIDER_SITE_OTHER): Admitting: Nurse Practitioner

## 2023-08-01 ENCOUNTER — Encounter (INDEPENDENT_AMBULATORY_CARE_PROVIDER_SITE_OTHER): Payer: Self-pay | Admitting: Nurse Practitioner

## 2023-08-01 VITALS — BP 153/76 | HR 73 | Resp 18

## 2023-08-01 DIAGNOSIS — T879 Unspecified complications of amputation stump: Secondary | ICD-10-CM

## 2023-08-02 NOTE — Progress Notes (Signed)
 Subjective:    Patient ID: Jackie Macias, female    DOB: 1955/09/06, 68 y.o.   MRN: [id number] Chief Complaint  Patient presents with   Follow-up    fu 1 week wound recheck    HPI  Review of Systems  Skin:  Positive for wound.  Neurological:  Positive for weakness.  All other systems reviewed and are negative.      Objective:   Physical Exam Vitals reviewed.  HENT:     Head: Normocephalic.  Cardiovascular:     Rate and Rhythm: Normal rate.  Pulmonary:     Effort: Pulmonary effort is normal.  Musculoskeletal:     Left Lower Extremity: Left leg is amputated below knee.  Skin:    General: Skin is warm and dry.  Neurological:     Mental Status: She is alert and oriented to person, place, and time.     Motor: Weakness present.     Gait: Gait abnormal.  Psychiatric:        Mood and Affect: Mood normal.        Behavior: Behavior normal.        Thought Content: Thought content normal.        Judgment: Judgment normal.     BP (!) 153/76   Pulse 73   Resp 18   Past Medical History:  Diagnosis Date   Arthritis    Bronchitis    COPD (chronic obstructive pulmonary disease) (HCC)    GERD (gastroesophageal reflux disease)    Gout    Hypertension     Social History   Socioeconomic History   Marital status: Single    Spouse name: Not on file   Number of children: Not on file   Years of education: Not on file   Highest education level: Not on file  Occupational History   Not on file  Tobacco Use   Smoking status: Every Day    Current packs/day: 1.00    Average packs/day: 1 pack/day for 50.0 years (50.0 ttl pk-yrs)    Types: Cigarettes   Smokeless tobacco: Never  Vaping Use   Vaping status: Never Used  Substance and Sexual Activity   Alcohol use: Yes    Alcohol/week: 3.0 standard drinks of alcohol    Types: 3 Cans of beer per week    Comment: occasional   Drug use: No   Sexual activity: Not on file  Other Topics Concern   Not on file  Social  History Narrative   Not on file   Social Drivers of Health   Financial Resource Strain: Not on file  Food Insecurity: No Food Insecurity (05/11/2023)   Hunger Vital Sign    Worried About Running Out of Food in the Last Year: Never true    Ran Out of Food in the Last Year: Never true  Transportation Needs: No Transportation Needs (05/11/2023)   PRAPARE - Administrator, Civil Service (Medical): No    Lack of Transportation (Non-Medical): No  Physical Activity: Not on file  Stress: Not on file  Social Connections: Unknown (05/11/2023)   Social Connection and Isolation Panel [NHANES]    Frequency of Communication with Friends and Family: Patient unable to answer    Frequency of Social Gatherings with Friends and Family: Patient unable to answer    Attends Religious Services: Patient declined    Active Member of Clubs or Organizations: Patient declined    Attends Banker Meetings: Patient declined  Marital Status: Patient declined  Intimate Partner Violence: Not At Risk (05/11/2023)   Humiliation, Afraid, Rape, and Kick questionnaire    Fear of Current or Ex-Partner: No    Emotionally Abused: No    Physically Abused: No    Sexually Abused: No    Past Surgical History:  Procedure Laterality Date   AMPUTATION Right 07/29/2017   Procedure: AMPUTATION DIGIT/ 16109;  Surgeon: Angel Barba, DPM;  Location: [facility];  Service: Podiatry;  Laterality: Right;   AMPUTATION Left 05/13/2023   Procedure: AMPUTATION BELOW KNEE;  Surgeon: Jackquelyn Mass, MD;  Location: [facility];  Service: Vascular;  Laterality: Left;   AMPUTATION TOE Right 02/18/2021   Procedure: AMPUTATION TOE-GREAT TOE;  Surgeon: Anell Baptist, DPM;  Location: [facility];  Service: Podiatry;  Laterality: Right;   CHOLECYSTECTOMY     facial biopsy Right    JOINT REPLACEMENT Right 12/2016   THR   LOWER EXTREMITY ANGIOGRAPHY Right 09/27/2017   Procedure: LOWER EXTREMITY ANGIOGRAPHY;  Surgeon: Jackquelyn Mass, MD;  Location: [facility];  Service: Cardiovascular;  Laterality: Right;   LOWER EXTREMITY ANGIOGRAPHY Right 06/17/2020   Procedure: LOWER EXTREMITY ANGIOGRAPHY;  Surgeon: Jackquelyn Mass, MD;  Location: [facility];  Service: Cardiovascular;  Laterality: Right;   LOWER EXTREMITY ANGIOGRAPHY Right 02/18/2021   Procedure: LOWER EXTREMITY ANGIOGRAPHY;  Surgeon: Jackquelyn Mass, MD;  Location: [facility];  Service: Cardiovascular;  Laterality: Right;   LOWER EXTREMITY ANGIOGRAPHY Left 09/07/2022   Procedure: Lower Extremity Angiography;  Surgeon: Jackquelyn Mass, MD;  Location: [facility];  Service: Cardiovascular;  Laterality: Left;   LOWER EXTREMITY ANGIOGRAPHY Left 03/07/2023   Procedure: Lower Extremity Angiography;  Surgeon: Celso College, MD;  Location: [facility];  Service: Cardiovascular;  Laterality: Left;   LOWER EXTREMITY ANGIOGRAPHY Left 03/08/2023   Procedure: Lower Extremity Angiography;  Surgeon: Jackquelyn Mass, MD;  Location: [facility];  Service: Cardiovascular;  Laterality: Left;   LOWER EXTREMITY ANGIOGRAPHY Left 05/11/2023   Procedure: Lower Extremity Angiography;  Surgeon: Jackquelyn Mass, MD;  Location: [facility];  Service: Cardiovascular;  Laterality: Left;   TONSILLECTOMY     TOTAL HIP ARTHROPLASTY Right 01/17/2017   Procedure: TOTAL HIP ARTHROPLASTY ANTERIOR APPROACH;  Surgeon: Jerlyn Moons, MD;  Location: [facility];  Service: Orthopedics;  Laterality: Right;    Family History  Problem Relation Age of Onset   Breast cancer Sister 92   Breast cancer Other     Allergies  Allergen Reactions   Lisinopril-Hydrochlorothiazide Other (See Comments)       Latest Ref Rng & Units 05/19/2023    5:45 AM 05/18/2023    5:44 AM 05/17/2023    4:12 PM  CBC  WBC 4.0 - 10.5 K/uL 6.8  7.5  8.9   Hemoglobin 12.0 - 15.0 g/dL 8.6  8.0  8.6   Hematocrit 36.0 - 46.0 % 25.9  23.6  25.1    Platelets 150 - 400 K/uL 408  356  372       CMP     Component Value Date/Time   NA 134 (L) 05/18/2023 0544   NA 138 11/22/2022 1042   K 3.6 05/18/2023 0544   CL 102 05/18/2023 0544   CO2 24 05/18/2023 0544   GLUCOSE 122 (H) 05/18/2023 0544   BUN 11 05/18/2023 0544   BUN 18 11/22/2022 1042   CREATININE 0.51 05/18/2023 0544   CALCIUM  8.3 (  L) 05/18/2023 0544   PROT 8.8 (H) 05/10/2023 1624   PROT 7.1 11/22/2022 1042   ALBUMIN 3.3 (L) 05/10/2023 1624   ALBUMIN 4.1 11/22/2022 1042   AST 20 05/10/2023 1624   ALT 11 05/10/2023 1624   ALKPHOS 64 05/10/2023 1624   BILITOT 0.9 05/10/2023 1624   BILITOT 0.3 11/22/2022 1042   EGFR 94 11/22/2022 1042   GFRNONAA >60 05/18/2023 0544     No results found.     Assessment & Plan:   1. BKA stump complication (HCC) (Primary) Today the drainage from the patient's wounds has improved and there is no foul-smelling drainage.  The wound bases do have fibrinous exudate.  In order to help debride these we will place Medihoney with Xeroform and gauze over the wound.  Also, in order to help facilitate care of the patient we will also arrange home health for the patient.  She had an upcoming prosthetic evaluation however given her continued wounds I have recommended that she wait until her wound is more healed to avoid possible worsening of her wound. - Ambulatory referral to Home Health   Current Outpatient Medications on File Prior to Visit  Medication Sig Dispense Refill   acetaminophen  (TYLENOL ) 325 MG tablet Take 2 tablets (650 mg total) by mouth every 6 (six) hours as needed for mild pain (pain score 1-3) (or Fever >/= 101).     albuterol  (VENTOLIN  HFA) 108 (90 Base) MCG/ACT inhaler Inhale 2 puffs into the lungs every 6 (six) hours as needed for wheezing or shortness of breath. 18 g 6   amLODipine  (NORVASC ) 5 MG tablet Take 1 tablet (5 mg total) by mouth daily.     aspirin  EC 81 MG tablet Take 1 tablet (81 mg total) by mouth daily. Swallow  whole. 30 tablet 0   clopidogrel  (PLAVIX ) 75 MG tablet Take 1 tablet (75 mg total) by mouth daily. 30 tablet 6   dextromethorphan-guaiFENesin  (MUCINEX  DM) 30-600 MG 12hr tablet Take 1 tablet by mouth 2 (two) times daily as needed for cough.     docusate sodium  (COLACE) 100 MG capsule Take 1 capsule (100 mg total) by mouth 2 (two) times daily.     DULoxetine  (CYMBALTA ) 30 MG capsule Take 1 capsule (30 mg total) by mouth daily. 30 capsule 2   ferrous sulfate  325 (65 FE) MG tablet Take 1 tablet (325 mg total) by mouth daily with breakfast. 30 tablet 0   furosemide  (LASIX ) 20 MG tablet Take 1 tablet (20 mg total) by mouth daily as needed for edema. 30 tablet 0   gabapentin  (NEURONTIN ) 600 MG tablet Take 1 tablet (600 mg total) by mouth daily. 30 tablet 2   ipratropium-albuterol  (DUONEB) 0.5-2.5 (3) MG/3ML SOLN Take 3 mLs by nebulization every 6 (six) hours as needed. 360 mL 3   nicotine  (NICODERM CQ  - DOSED IN MG/24 HOURS) 21 mg/24hr patch Place 1 patch (21 mg total) onto the skin daily.     oxyCODONE  (OXY IR/ROXICODONE ) 5 MG immediate release tablet Take 1 tablet (5 mg total) by mouth every 6 (six) hours as needed for severe pain (pain score 7-10). 30 tablet 0   pantoprazole  (PROTONIX ) 40 MG tablet Take 1 tablet (40 mg total) by mouth every morning. 90 tablet 3   polyethylene glycol (MIRALAX  / GLYCOLAX ) 17 g packet Take 17 g by mouth daily as needed. 30 each 0   rosuvastatin  (CRESTOR ) 20 MG tablet Take 1 tablet (20 mg total) by mouth daily. 90 tablet 3  sulfamethoxazole -trimethoprim  (BACTRIM  DS) 800-160 MG tablet Take 1 tablet by mouth 2 (two) times daily. 20 tablet 0   Vitamin D , Ergocalciferol , (DRISDOL ) 1.25 MG (50000 UNIT) CAPS capsule Take 1 capsule (50,000 Units total) by mouth every 7 (seven) days. 12 capsule 3   No current facility-administered medications on file prior to visit.    There are no Patient Instructions on file for this visit. No follow-ups on file.   Amori Cooperman E Mosella Kasa,  NP

## 2023-08-15 ENCOUNTER — Encounter (INDEPENDENT_AMBULATORY_CARE_PROVIDER_SITE_OTHER): Payer: Self-pay | Admitting: Nurse Practitioner

## 2023-08-15 ENCOUNTER — Ambulatory Visit (INDEPENDENT_AMBULATORY_CARE_PROVIDER_SITE_OTHER): Admitting: Nurse Practitioner

## 2023-08-15 VITALS — BP 117/66 | HR 67 | Resp 16

## 2023-08-15 DIAGNOSIS — T879 Unspecified complications of amputation stump: Secondary | ICD-10-CM

## 2023-08-15 MED ORDER — OXYCODONE HCL 5 MG PO TABS: 5.0000 mg | ORAL_TABLET | Freq: Four times a day (QID) | ORAL | 0 refills | Status: DC | PRN

## 2023-08-15 NOTE — Progress Notes (Signed)
 Subjective:    Patient ID: TONISHA SILVEY, female    DOB: 10-21-1955, 68 y.o.   MRN: [id number] Chief Complaint  Patient presents with   Follow-up    2 week follow up    The patient returns today for evaluation of wounds post left below-knee amputation.  She has been doing fairly well but does have some aches and pains associated with the amputation site.  The previous wounds have started to decrease in size.  She is doing well with dressing changes.  There is not been any development of significant drainage or development of new infection.    Review of Systems  Skin:  Positive for wound.  Neurological:  Positive for weakness.  All other systems reviewed and are negative.      Objective:   Physical Exam Vitals reviewed.  HENT:     Head: Normocephalic.  Cardiovascular:     Rate and Rhythm: Normal rate.  Pulmonary:     Effort: Pulmonary effort is normal.  Musculoskeletal:     Left Lower Extremity: Left leg is amputated below knee.  Skin:    General: Skin is warm and dry.  Neurological:     Mental Status: She is alert and oriented to person, place, and time.     Motor: Weakness present.     Gait: Gait abnormal.  Psychiatric:        Mood and Affect: Mood normal.        Behavior: Behavior normal.        Thought Content: Thought content normal.        Judgment: Judgment normal.     BP 117/66   Pulse 67   Resp 16   Past Medical History:  Diagnosis Date   Arthritis    Bronchitis    COPD (chronic obstructive pulmonary disease) (HCC)    GERD (gastroesophageal reflux disease)    Gout    Hypertension     Social History   Socioeconomic History   Marital status: Single    Spouse name: Not on file   Number of children: Not on file   Years of education: Not on file   Highest education level: Not on file  Occupational History   Not on file  Tobacco Use   Smoking status: Every Day    Current packs/day: 1.00    Average packs/day: 1 pack/day for 50.0 years (50.0  ttl pk-yrs)    Types: Cigarettes   Smokeless tobacco: Never  Vaping Use   Vaping status: Never Used  Substance and Sexual Activity   Alcohol use: Yes    Alcohol/week: 3.0 standard drinks of alcohol    Types: 3 Cans of beer per week    Comment: occasional   Drug use: No   Sexual activity: Not on file  Other Topics Concern   Not on file  Social History Narrative   Not on file   Social Drivers of Health   Financial Resource Strain: Not on file  Food Insecurity: No Food Insecurity (05/11/2023)   Hunger Vital Sign    Worried About Running Out of Food in the Last Year: Never true    Ran Out of Food in the Last Year: Never true  Transportation Needs: No Transportation Needs (05/11/2023)   PRAPARE - Administrator, Civil Service (Medical): No    Lack of Transportation (Non-Medical): No  Physical Activity: Not on file  Stress: Not on file  Social Connections: Unknown (05/11/2023)   Social Connection and  Isolation Panel [NHANES]    Frequency of Communication with Friends and Family: Patient unable to answer    Frequency of Social Gatherings with Friends and Family: Patient unable to answer    Attends Religious Services: Patient declined    Active Member of Clubs or Organizations: Patient declined    Attends Banker Meetings: Patient declined    Marital Status: Patient declined  Intimate Partner Violence: Not At Risk (05/11/2023)   Humiliation, Afraid, Rape, and Kick questionnaire    Fear of Current or Ex-Partner: No    Emotionally Abused: No    Physically Abused: No    Sexually Abused: No    Past Surgical History:  Procedure Laterality Date   AMPUTATION Right 07/29/2017   Procedure: AMPUTATION DIGIT/ 28825;  Surgeon: Angel Barba, DPM;  Location: [facility];  Service: Podiatry;  Laterality: Right;   AMPUTATION Left 05/13/2023   Procedure: AMPUTATION BELOW KNEE;  Surgeon: Jackquelyn Mass, MD;  Location: [facility];  Service: Vascular;  Laterality: Left;    AMPUTATION TOE Right 02/18/2021   Procedure: AMPUTATION TOE-GREAT TOE;  Surgeon: Anell Baptist, DPM;  Location: [facility];  Service: Podiatry;  Laterality: Right;   CHOLECYSTECTOMY     facial biopsy Right    JOINT REPLACEMENT Right 12/2016   THR   LOWER EXTREMITY ANGIOGRAPHY Right 09/27/2017   Procedure: LOWER EXTREMITY ANGIOGRAPHY;  Surgeon: Jackquelyn Mass, MD;  Location: [facility];  Service: Cardiovascular;  Laterality: Right;   LOWER EXTREMITY ANGIOGRAPHY Right 06/17/2020   Procedure: LOWER EXTREMITY ANGIOGRAPHY;  Surgeon: Jackquelyn Mass, MD;  Location: [facility];  Service: Cardiovascular;  Laterality: Right;   LOWER EXTREMITY ANGIOGRAPHY Right 02/18/2021   Procedure: LOWER EXTREMITY ANGIOGRAPHY;  Surgeon: Jackquelyn Mass, MD;  Location: [facility];  Service: Cardiovascular;  Laterality: Right;   LOWER EXTREMITY ANGIOGRAPHY Left 09/07/2022   Procedure: Lower Extremity Angiography;  Surgeon: Jackquelyn Mass, MD;  Location: [facility];  Service: Cardiovascular;  Laterality: Left;   LOWER EXTREMITY ANGIOGRAPHY Left 03/07/2023   Procedure: Lower Extremity Angiography;  Surgeon: Celso College, MD;  Location: [facility];  Service: Cardiovascular;  Laterality: Left;   LOWER EXTREMITY ANGIOGRAPHY Left 03/08/2023   Procedure: Lower Extremity Angiography;  Surgeon: Jackquelyn Mass, MD;  Location: [facility];  Service: Cardiovascular;  Laterality: Left;   LOWER EXTREMITY ANGIOGRAPHY Left 05/11/2023   Procedure: Lower Extremity Angiography;  Surgeon: Jackquelyn Mass, MD;  Location: [facility];  Service: Cardiovascular;  Laterality: Left;   TONSILLECTOMY     TOTAL HIP ARTHROPLASTY Right 01/17/2017   Procedure: TOTAL HIP ARTHROPLASTY ANTERIOR APPROACH;  Surgeon: Jerlyn Moons, MD;  Location: [facility];  Service: Orthopedics;  Laterality: Right;    Family History  Problem Relation Age of Onset   Breast cancer Sister  31   Breast cancer Other     Allergies  Allergen Reactions   Lisinopril-Hydrochlorothiazide Other (See Comments)       Latest Ref Rng & Units 05/19/2023    5:45 AM 05/18/2023    5:44 AM 05/17/2023    4:12 PM  CBC  WBC 4.0 - 10.5 K/uL 6.8  7.5  8.9   Hemoglobin 12.0 - 15.0 g/dL 8.6  8.0  8.6   Hematocrit 36.0 - 46.0 % 25.9  23.6  25.1   Platelets 150 - 400 K/uL 408  356  372       CMP  Component Value Date/Time   NA 134 (L) 05/18/2023 0544   NA 138 11/22/2022 1042   K 3.6 05/18/2023 0544   CL 102 05/18/2023 0544   CO2 24 05/18/2023 0544   GLUCOSE 122 (H) 05/18/2023 0544   BUN 11 05/18/2023 0544   BUN 18 11/22/2022 1042   CREATININE 0.51 05/18/2023 0544   CALCIUM  8.3 (L) 05/18/2023 0544   PROT 8.8 (H) 05/10/2023 1624   PROT 7.1 11/22/2022 1042   ALBUMIN 3.3 (L) 05/10/2023 1624   ALBUMIN 4.1 11/22/2022 1042   AST 20 05/10/2023 1624   ALT 11 05/10/2023 1624   ALKPHOS 64 05/10/2023 1624   BILITOT 0.9 05/10/2023 1624   BILITOT 0.3 11/22/2022 1042   EGFR 94 11/22/2022 1042   GFRNONAA >60 05/18/2023 0544     No results found.     Assessment & Plan:   1. BKA stump complication (HCC) (Primary) The patient's wounds have been decreasing in size.  She will continue to utilize Medihoney for the wound.  She has not yet been visited by home health so we will reach out to facilitate treatment.  Patient will follow-up in the next 4 weeks. - Ambulatory referral to Home Health   Current Outpatient Medications on File Prior to Visit  Medication Sig Dispense Refill   acetaminophen  (TYLENOL ) 325 MG tablet Take 2 tablets (650 mg total) by mouth every 6 (six) hours as needed for mild pain (pain score 1-3) (or Fever >/= 101).     albuterol  (VENTOLIN  HFA) 108 (90 Base) MCG/ACT inhaler Inhale 2 puffs into the lungs every 6 (six) hours as needed for wheezing or shortness of breath. 18 g 6   amLODipine  (NORVASC ) 5 MG tablet Take 1 tablet (5 mg total) by mouth daily.     aspirin  EC  81 MG tablet Take 1 tablet (81 mg total) by mouth daily. Swallow whole. 30 tablet 0   clopidogrel  (PLAVIX ) 75 MG tablet Take 1 tablet (75 mg total) by mouth daily. 30 tablet 6   dextromethorphan-guaiFENesin  (MUCINEX  DM) 30-600 MG 12hr tablet Take 1 tablet by mouth 2 (two) times daily as needed for cough.     docusate sodium  (COLACE) 100 MG capsule Take 1 capsule (100 mg total) by mouth 2 (two) times daily.     DULoxetine  (CYMBALTA ) 30 MG capsule Take 1 capsule (30 mg total) by mouth daily. 30 capsule 2   ferrous sulfate  325 (65 FE) MG tablet Take 1 tablet (325 mg total) by mouth daily with breakfast. 30 tablet 0   furosemide  (LASIX ) 20 MG tablet Take 1 tablet (20 mg total) by mouth daily as needed for edema. 30 tablet 0   gabapentin  (NEURONTIN ) 600 MG tablet Take 1 tablet (600 mg total) by mouth daily. 30 tablet 2   ipratropium-albuterol  (DUONEB) 0.5-2.5 (3) MG/3ML SOLN Take 3 mLs by nebulization every 6 (six) hours as needed. 360 mL 3   nicotine  (NICODERM CQ  - DOSED IN MG/24 HOURS) 21 mg/24hr patch Place 1 patch (21 mg total) onto the skin daily.     pantoprazole  (PROTONIX ) 40 MG tablet Take 1 tablet (40 mg total) by mouth every morning. 90 tablet 3   polyethylene glycol (MIRALAX  / GLYCOLAX ) 17 g packet Take 17 g by mouth daily as needed. 30 each 0   rosuvastatin  (CRESTOR ) 20 MG tablet Take 1 tablet (20 mg total) by mouth daily. 90 tablet 3   sulfamethoxazole -trimethoprim  (BACTRIM  DS) 800-160 MG tablet Take 1 tablet by mouth 2 (two) times daily. 20  tablet 0   Vitamin D , Ergocalciferol , (DRISDOL ) 1.25 MG (50000 UNIT) CAPS capsule Take 1 capsule (50,000 Units total) by mouth every 7 (seven) days. 12 capsule 3   No current facility-administered medications on file prior to visit.    There are no Patient Instructions on file for this visit. No follow-ups on file.   Talmage Teaster E Anirudh Baiz, NP

## 2023-09-05 ENCOUNTER — Ambulatory Visit (INDEPENDENT_AMBULATORY_CARE_PROVIDER_SITE_OTHER): Admitting: Vascular Surgery

## 2023-09-06 ENCOUNTER — Telehealth (INDEPENDENT_AMBULATORY_CARE_PROVIDER_SITE_OTHER): Payer: Self-pay

## 2023-09-06 NOTE — Telephone Encounter (Signed)
 Patient left a message requesting pain medication refill for pain relief. I spoke with Cooper Denver NP and he recommended that the patient will need to rescheduled missed appointment and refill request will be discuss at the office visit. I left a detailed message on the patient voicemail.

## 2023-09-13 ENCOUNTER — Telehealth: Payer: Self-pay | Admitting: Family

## 2023-09-13 NOTE — Telephone Encounter (Signed)
 Patient left VM that she is very depressed since having her leg amputated and she needs some medication to help her. She will need an appointment to discuss medications with Mylinda Asa before we just send something in. Please call and schedule an appt.

## 2023-09-14 ENCOUNTER — Encounter (INDEPENDENT_AMBULATORY_CARE_PROVIDER_SITE_OTHER): Payer: Self-pay | Admitting: Vascular Surgery

## 2023-09-14 ENCOUNTER — Ambulatory Visit (INDEPENDENT_AMBULATORY_CARE_PROVIDER_SITE_OTHER): Admitting: Vascular Surgery

## 2023-09-14 VITALS — BP 130/73 | HR 69 | Resp 16 | Ht 66.0 in | Wt 138.0 lb

## 2023-09-14 DIAGNOSIS — T879 Unspecified complications of amputation stump: Secondary | ICD-10-CM

## 2023-09-14 DIAGNOSIS — I10 Essential (primary) hypertension: Secondary | ICD-10-CM | POA: Diagnosis not present

## 2023-09-14 DIAGNOSIS — N3281 Overactive bladder: Secondary | ICD-10-CM | POA: Diagnosis not present

## 2023-09-14 DIAGNOSIS — N3946 Mixed incontinence: Secondary | ICD-10-CM | POA: Diagnosis not present

## 2023-09-14 NOTE — Progress Notes (Signed)
 Subjective:    Patient ID: Jackie Macias, female    DOB: [birth year], 68 y.o.   MRN: [id number] Chief Complaint  Patient presents with   Follow-up    3-4 week follow up L BKA    The patient returns today for evaluation of wounds post left below-knee amputation. She has been doing fairly well but does have some aches and pains associated with the amputation site. The previous wounds have decreased in size. She is doing well with dressing changes. There is not been any development of significant drainage or development of new infection. Patient will continue wound care as previous with ace bandage to reduce swelling.      Review of Systems  Constitutional: Negative.   Skin:  Positive for wound.  Neurological:  Positive for weakness.  All other systems reviewed and are negative.      Objective:   Physical Exam Constitutional:      Appearance: Normal appearance. She is obese.  HENT:     Head: Normocephalic.   Eyes:     Pupils: Pupils are equal, round, and reactive to light.    Cardiovascular:     Rate and Rhythm: Normal rate and regular rhythm.     Pulses: Normal pulses.     Heart sounds: Normal heart sounds.  Pulmonary:     Effort: Pulmonary effort is normal.     Breath sounds: Normal breath sounds.  Abdominal:     General: Abdomen is flat.     Palpations: Abdomen is soft.   Musculoskeletal:     Amputation Left Lower Extremity: Left leg is amputated below knee.   Skin:    General: Skin is warm and dry.     Capillary Refill: Capillary refill takes 2 to 3 seconds.   Neurological:     General: No focal deficit present.     Mental Status: She is alert and oriented to person, place, and time. Mental status is at baseline.     Motor: Weakness present.     Gait: Gait abnormal.   Psychiatric:        Mood and Affect: Mood normal.        Behavior: Behavior normal.        Thought Content: Thought content normal.        Judgment: Judgment normal.     BP 130/73 (BP  Location: Right Arm, Patient Position: Sitting, Cuff Size: Normal)   Pulse 69   Resp 16   Ht 5' 6 (1.676 m)   Wt 138 lb (62.6 kg)   BMI 22.27 kg/m   Past Medical History:  Diagnosis Date   Arthritis    Bronchitis    COPD (chronic obstructive pulmonary disease) (HCC)    GERD (gastroesophageal reflux disease)    Gout    Hypertension     Social History   Socioeconomic History   Marital status: Single    Spouse name: Not on file   Number of children: Not on file   Years of education: Not on file   Highest education level: Not on file  Occupational History   Not on file  Tobacco Use   Smoking status: Every Day    Current packs/day: 1.00    Average packs/day: 1 pack/day for 50.0 years (50.0 ttl pk-yrs)    Types: Cigarettes   Smokeless tobacco: Never  Vaping Use   Vaping status: Never Used  Substance and Sexual Activity   Alcohol use: Yes    Alcohol/week: 3.0 standard drinks  of alcohol    Types: 3 Cans of beer per week    Comment: occasional   Drug use: No   Sexual activity: Not on file  Other Topics Concern   Not on file  Social History Narrative   Not on file   Social Drivers of Health   Financial Resource Strain: Not on file  Food Insecurity: No Food Insecurity (05/11/2023)   Hunger Vital Sign    Worried About Running Out of Food in the Last Year: Never true    Ran Out of Food in the Last Year: Never true  Transportation Needs: No Transportation Needs (05/11/2023)   PRAPARE - Administrator, Civil Service (Medical): No    Lack of Transportation (Non-Medical): No  Physical Activity: Not on file  Stress: Not on file  Social Connections: Unknown (05/11/2023)   Social Connection and Isolation Panel    Frequency of Communication with Friends and Family: Patient unable to answer    Frequency of Social Gatherings with Friends and Family: Patient unable to answer    Attends Religious Services: Patient declined    Active Member of Clubs or Organizations:  Patient declined    Attends Banker Meetings: Patient declined    Marital Status: Patient declined  Intimate Partner Violence: Not At Risk (05/11/2023)   Humiliation, Afraid, Rape, and Kick questionnaire    Fear of Current or Ex-Partner: No    Emotionally Abused: No    Physically Abused: No    Sexually Abused: No    Past Surgical History:  Procedure Laterality Date   AMPUTATION Right 07/29/2017   Procedure: AMPUTATION DIGIT/ 28825;  Surgeon: Angel Barba, DPM;  Location: [facility];  Service: Podiatry;  Laterality: Right;   AMPUTATION Left 05/13/2023   Procedure: AMPUTATION BELOW KNEE;  Surgeon: Jackquelyn Mass, MD;  Location: [facility];  Service: Vascular;  Laterality: Left;   AMPUTATION TOE Right 02/18/2021   Procedure: AMPUTATION TOE-GREAT TOE;  Surgeon: Anell Baptist, DPM;  Location: [facility];  Service: Podiatry;  Laterality: Right;   CHOLECYSTECTOMY     facial biopsy Right    JOINT REPLACEMENT Right 12/2016   THR   LOWER EXTREMITY ANGIOGRAPHY Right 09/27/2017   Procedure: LOWER EXTREMITY ANGIOGRAPHY;  Surgeon: Jackquelyn Mass, MD;  Location: [facility];  Service: Cardiovascular;  Laterality: Right;   LOWER EXTREMITY ANGIOGRAPHY Right 06/17/2020   Procedure: LOWER EXTREMITY ANGIOGRAPHY;  Surgeon: Jackquelyn Mass, MD;  Location: [facility];  Service: Cardiovascular;  Laterality: Right;   LOWER EXTREMITY ANGIOGRAPHY Right 02/18/2021   Procedure: LOWER EXTREMITY ANGIOGRAPHY;  Surgeon: Jackquelyn Mass, MD;  Location: [facility];  Service: Cardiovascular;  Laterality: Right;   LOWER EXTREMITY ANGIOGRAPHY Left 09/07/2022   Procedure: Lower Extremity Angiography;  Surgeon: Jackquelyn Mass, MD;  Location: [facility];  Service: Cardiovascular;  Laterality: Left;   LOWER EXTREMITY ANGIOGRAPHY Left 03/07/2023   Procedure: Lower Extremity Angiography;  Surgeon: Celso College, MD;  Location: [facility];  Service: Cardiovascular;   Laterality: Left;   LOWER EXTREMITY ANGIOGRAPHY Left 03/08/2023   Procedure: Lower Extremity Angiography;  Surgeon: Jackquelyn Mass, MD;  Location: [facility];  Service: Cardiovascular;  Laterality: Left;   LOWER EXTREMITY ANGIOGRAPHY Left 05/11/2023   Procedure: Lower Extremity Angiography;  Surgeon: Jackquelyn Mass, MD;  Location: [facility];  Service: Cardiovascular;  Laterality: Left;   TONSILLECTOMY     TOTAL HIP ARTHROPLASTY Right  01/17/2017   Procedure: TOTAL HIP ARTHROPLASTY ANTERIOR APPROACH;  Surgeon: Jerlyn Moons, MD;  Location: [facility];  Service: Orthopedics;  Laterality: Right;    Family History  Problem Relation Age of Onset   Breast cancer Sister 39   Breast cancer Other     Allergies  Allergen Reactions   Lisinopril-Hydrochlorothiazide Other (See Comments)       Latest Ref Rng & Units 05/19/2023    5:45 AM 05/18/2023    5:44 AM 05/17/2023    4:12 PM  CBC  WBC 4.0 - 10.5 K/uL 6.8  7.5  8.9   Hemoglobin 12.0 - 15.0 g/dL 8.6  8.0  8.6   Hematocrit 36.0 - 46.0 % 25.9  23.6  25.1   Platelets 150 - 400 K/uL 408  356  372       CMP     Component Value Date/Time   NA 134 (L) 05/18/2023 0544   NA 138 11/22/2022 1042   K 3.6 05/18/2023 0544   CL 102 05/18/2023 0544   CO2 24 05/18/2023 0544   GLUCOSE 122 (H) 05/18/2023 0544   BUN 11 05/18/2023 0544   BUN 18 11/22/2022 1042   CREATININE 0.51 05/18/2023 0544   CALCIUM  8.3 (L) 05/18/2023 0544   PROT 8.8 (H) 05/10/2023 1624   PROT 7.1 11/22/2022 1042   ALBUMIN 3.3 (L) 05/10/2023 1624   ALBUMIN 4.1 11/22/2022 1042   AST 20 05/10/2023 1624   ALT 11 05/10/2023 1624   ALKPHOS 64 05/10/2023 1624   BILITOT 0.9 05/10/2023 1624   BILITOT 0.3 11/22/2022 1042   EGFR 94 11/22/2022 1042   GFRNONAA >60 05/18/2023 0544     No results found.     Assessment & Plan:   1. BKA stump complication (HCC) (Primary) The patient's wounds have been decreasing in size.  She will continue to utilize  [facility] for the wound.  She has not yet been visited by home health so we will reach out to facilitate treatment.  Patient will follow-up in the next 4 weeks. - Ambulatory referral to Home Health  Current Outpatient Medications on File Prior to Visit  Medication Sig Dispense Refill   acetaminophen  (TYLENOL ) 325 MG tablet Take 2 tablets (650 mg total) by mouth every 6 (six) hours as needed for mild pain (pain score 1-3) (or Fever >/= 101).     albuterol  (VENTOLIN  HFA) 108 (90 Base) MCG/ACT inhaler Inhale 2 puffs into the lungs every 6 (six) hours as needed for wheezing or shortness of breath. 18 g 6   amLODipine  (NORVASC ) 5 MG tablet Take 1 tablet (5 mg total) by mouth daily.     aspirin  EC 81 MG tablet Take 1 tablet (81 mg total) by mouth daily. Swallow whole. 30 tablet 0   clopidogrel  (PLAVIX ) 75 MG tablet Take 1 tablet (75 mg total) by mouth daily. 30 tablet 6   dextromethorphan-guaiFENesin  (MUCINEX  DM) 30-600 MG 12hr tablet Take 1 tablet by mouth 2 (two) times daily as needed for cough.     docusate sodium  (COLACE) 100 MG capsule Take 1 capsule (100 mg total) by mouth 2 (two) times daily.     DULoxetine  (CYMBALTA ) 30 MG capsule Take 1 capsule (30 mg total) by mouth daily. 30 capsule 2   ferrous sulfate  325 (65 FE) MG tablet Take 1 tablet (325 mg total) by mouth daily with breakfast. 30 tablet 0   furosemide  (LASIX ) 20 MG tablet Take 1 tablet (20 mg total) by mouth daily as needed  for edema. 30 tablet 0   gabapentin  (NEURONTIN ) 600 MG tablet Take 1 tablet (600 mg total) by mouth daily. 30 tablet 2   ipratropium-albuterol  (DUONEB) 0.5-2.5 (3) MG/3ML SOLN Take 3 mLs by nebulization every 6 (six) hours as needed. 360 mL 3   nicotine  (NICODERM CQ  - DOSED IN MG/24 HOURS) 21 mg/24hr patch Place 1 patch (21 mg total) onto the skin daily.     oxyCODONE  (OXY IR/ROXICODONE ) 5 MG immediate release tablet Take 1 tablet (5 mg total) by mouth every 6 (six) hours as needed for severe pain (pain score 7-10).  30 tablet 0   pantoprazole  (PROTONIX ) 40 MG tablet Take 1 tablet (40 mg total) by mouth every morning. 90 tablet 3   polyethylene glycol (MIRALAX  / GLYCOLAX ) 17 g packet Take 17 g by mouth daily as needed. 30 each 0   rosuvastatin  (CRESTOR ) 20 MG tablet Take 1 tablet (20 mg total) by mouth daily. 90 tablet 3   sulfamethoxazole -trimethoprim  (BACTRIM  DS) 800-160 MG tablet Take 1 tablet by mouth 2 (two) times daily. 20 tablet 0   Vitamin D , Ergocalciferol , (DRISDOL ) 1.25 MG (50000 UNIT) CAPS capsule Take 1 capsule (50,000 Units total) by mouth every 7 (seven) days. 12 capsule 3   No current facility-administered medications on file prior to visit.    There are no Patient Instructions on file for this visit. No follow-ups on file.   Annamaria Barrette, NP

## 2023-09-16 ENCOUNTER — Other Ambulatory Visit (INDEPENDENT_AMBULATORY_CARE_PROVIDER_SITE_OTHER): Payer: Self-pay | Admitting: Vascular Surgery

## 2023-09-16 ENCOUNTER — Telehealth (INDEPENDENT_AMBULATORY_CARE_PROVIDER_SITE_OTHER): Payer: Self-pay

## 2023-09-16 MED ORDER — OXYCODONE HCL 5 MG PO TABS: 5.0000 mg | ORAL_TABLET | Freq: Four times a day (QID) | ORAL | 0 refills | Status: DC | PRN

## 2023-09-16 NOTE — Telephone Encounter (Signed)
 Patient called asking about medication that was to be called into her pharmacy at [facility] in [place]. Patient is not sure of what the medication is but feels it was pain medication.

## 2023-09-29 ENCOUNTER — Ambulatory Visit: Admitting: Family

## 2023-10-04 DIAGNOSIS — E119 Type 2 diabetes mellitus without complications: Secondary | ICD-10-CM | POA: Diagnosis not present

## 2023-10-04 DIAGNOSIS — B351 Tinea unguium: Secondary | ICD-10-CM | POA: Diagnosis not present

## 2023-10-04 DIAGNOSIS — Z89512 Acquired absence of left leg below knee: Secondary | ICD-10-CM | POA: Diagnosis not present

## 2023-10-04 DIAGNOSIS — I739 Peripheral vascular disease, unspecified: Secondary | ICD-10-CM | POA: Diagnosis not present

## 2023-10-11 ENCOUNTER — Ambulatory Visit (INDEPENDENT_AMBULATORY_CARE_PROVIDER_SITE_OTHER): Admitting: Nurse Practitioner

## 2023-10-17 ENCOUNTER — Encounter (INDEPENDENT_AMBULATORY_CARE_PROVIDER_SITE_OTHER): Payer: Self-pay | Admitting: Vascular Surgery

## 2023-10-17 ENCOUNTER — Ambulatory Visit (INDEPENDENT_AMBULATORY_CARE_PROVIDER_SITE_OTHER): Admitting: Vascular Surgery

## 2023-10-17 VITALS — BP 140/72 | HR 62 | Resp 16 | Ht 66.0 in | Wt 130.0 lb

## 2023-10-17 DIAGNOSIS — I10 Essential (primary) hypertension: Secondary | ICD-10-CM | POA: Diagnosis not present

## 2023-10-17 DIAGNOSIS — E782 Mixed hyperlipidemia: Secondary | ICD-10-CM | POA: Diagnosis not present

## 2023-10-17 DIAGNOSIS — T879 Unspecified complications of amputation stump: Secondary | ICD-10-CM

## 2023-10-17 MED ORDER — OXYCODONE HCL 5 MG PO TABS: 5.0000 mg | ORAL_TABLET | Freq: Four times a day (QID) | ORAL | 0 refills | Status: DC | PRN

## 2023-10-17 NOTE — Progress Notes (Signed)
 Subjective:    Patient ID: Jackie Macias, female    DOB: Jun 04, 1955, 68 y.o.   MRN: [id number] Chief Complaint  Patient presents with   Wound Check    4 week follow up wound check    The patient returns today for evaluation of wounds post left below-knee amputation. She has been doing fairly well but does have some aches and pains associated with the amputation site. The previous wounds have all healed. She is not being compliant with a pressure dressing to stump and therefore has edema. There is not been any development of significant drainage or development of new infection. Patient will continue as previous with ace bandage to reduce swelling.    Review of Systems  Constitutional: Negative.   Cardiovascular:  Positive for leg swelling.  All other systems reviewed and are negative.      Objective:   Physical Exam Constitutional:      Appearance: Normal appearance. She is obese.  HENT:     Head: Normocephalic.  Eyes:     Pupils: Pupils are equal, round, and reactive to light.  Cardiovascular:     Rate and Rhythm: Normal rate and regular rhythm.     Pulses: Normal pulses.     Heart sounds: Normal heart sounds.  Pulmonary:     Effort: Pulmonary effort is normal.     Breath sounds: Normal breath sounds.  Abdominal:     General: Abdomen is flat. Bowel sounds are normal.     Palpations: Abdomen is soft.  Musculoskeletal:        General: Swelling present.     Comments: Positive +2 edema to stump as she is refusing to be compliant with stump wrapping for edema reduction and control.   Skin:    General: Skin is warm and dry.     Capillary Refill: Capillary refill takes 2 to 3 seconds.  Neurological:     General: No focal deficit present.     Mental Status: She is alert and oriented to person, place, and time. Mental status is at baseline.  Psychiatric:        Mood and Affect: Mood normal.        Behavior: Behavior normal.        Thought Content: Thought content normal.         Judgment: Judgment normal.     BP (!) 140/72 (BP Location: Left Arm, Patient Position: Sitting, Cuff Size: Normal)   Pulse 62   Resp 16   Ht 5' 6 (1.676 m)   Wt 130 lb (59 kg)   BMI 20.98 kg/m   Past Medical History:  Diagnosis Date   Arthritis    Bronchitis    COPD (chronic obstructive pulmonary disease) (HCC)    GERD (gastroesophageal reflux disease)    Gout    Hypertension     Social History   Socioeconomic History   Marital status: Single    Spouse name: Not on file   Number of children: Not on file   Years of education: Not on file   Highest education level: Not on file  Occupational History   Not on file  Tobacco Use   Smoking status: Every Day    Current packs/day: 1.00    Average packs/day: 1 pack/day for 50.0 years (50.0 ttl pk-yrs)    Types: Cigarettes   Smokeless tobacco: Never  Vaping Use   Vaping status: Never Used  Substance and Sexual Activity   Alcohol use: Yes  Alcohol/week: 3.0 standard drinks of alcohol    Types: 3 Cans of beer per week    Comment: occasional   Drug use: No   Sexual activity: Not on file  Other Topics Concern   Not on file  Social History Narrative   Not on file   Social Drivers of Health   Financial Resource Strain: Not on file  Food Insecurity: No Food Insecurity (05/11/2023)   Hunger Vital Sign    Worried About Running Out of Food in the Last Year: Never true    Ran Out of Food in the Last Year: Never true  Transportation Needs: No Transportation Needs (05/11/2023)   PRAPARE - Administrator, Civil Service (Medical): No    Lack of Transportation (Non-Medical): No  Physical Activity: Not on file  Stress: Not on file  Social Connections: Unknown (05/11/2023)   Social Connection and Isolation Panel    Frequency of Communication with Friends and Family: Patient unable to answer    Frequency of Social Gatherings with Friends and Family: Patient unable to answer    Attends Religious Services:  Patient declined    Active Member of Clubs or Organizations: Patient declined    Attends Banker Meetings: Patient declined    Marital Status: Patient declined  Intimate Partner Violence: Not At Risk (05/11/2023)   Humiliation, Afraid, Rape, and Kick questionnaire    Fear of Current or Ex-Partner: No    Emotionally Abused: No    Physically Abused: No    Sexually Abused: No    Past Surgical History:  Procedure Laterality Date   AMPUTATION Right 07/29/2017   Procedure: AMPUTATION DIGIT/ 28825;  Surgeon: Neill Boas, DPM;  Location: [facility];  Service: Podiatry;  Laterality: Right;   AMPUTATION Left 05/13/2023   Procedure: AMPUTATION BELOW KNEE;  Surgeon: Jama Cordella MATSU, MD;  Location: [facility];  Service: Vascular;  Laterality: Left;   AMPUTATION TOE Right 02/18/2021   Procedure: AMPUTATION TOE-GREAT TOE;  Surgeon: Ashley Soulier, DPM;  Location: [facility];  Service: Podiatry;  Laterality: Right;   CHOLECYSTECTOMY     facial biopsy Right    JOINT REPLACEMENT Right 12/2016   THR   LOWER EXTREMITY ANGIOGRAPHY Right 09/27/2017   Procedure: LOWER EXTREMITY ANGIOGRAPHY;  Surgeon: Jama Cordella MATSU, MD;  Location: [facility];  Service: Cardiovascular;  Laterality: Right;   LOWER EXTREMITY ANGIOGRAPHY Right 06/17/2020   Procedure: LOWER EXTREMITY ANGIOGRAPHY;  Surgeon: Jama Cordella MATSU, MD;  Location: [facility];  Service: Cardiovascular;  Laterality: Right;   LOWER EXTREMITY ANGIOGRAPHY Right 02/18/2021   Procedure: LOWER EXTREMITY ANGIOGRAPHY;  Surgeon: Jama Cordella MATSU, MD;  Location: [facility];  Service: Cardiovascular;  Laterality: Right;   LOWER EXTREMITY ANGIOGRAPHY Left 09/07/2022   Procedure: Lower Extremity Angiography;  Surgeon: Jama Cordella MATSU, MD;  Location: [facility];  Service: Cardiovascular;  Laterality: Left;   LOWER EXTREMITY ANGIOGRAPHY Left 03/07/2023   Procedure: Lower Extremity Angiography;  Surgeon: Marea Selinda RAMAN,  MD;  Location: [facility];  Service: Cardiovascular;  Laterality: Left;   LOWER EXTREMITY ANGIOGRAPHY Left 03/08/2023   Procedure: Lower Extremity Angiography;  Surgeon: Jama Cordella MATSU, MD;  Location: [facility];  Service: Cardiovascular;  Laterality: Left;   LOWER EXTREMITY ANGIOGRAPHY Left 05/11/2023   Procedure: Lower Extremity Angiography;  Surgeon: Jama Cordella MATSU, MD;  Location: [facility];  Service: Cardiovascular;  Laterality: Left;   TONSILLECTOMY  TOTAL HIP ARTHROPLASTY Right 01/17/2017   Procedure: TOTAL HIP ARTHROPLASTY ANTERIOR APPROACH;  Surgeon: Leora Lynwood SAUNDERS, MD;  Location: [facility];  Service: Orthopedics;  Laterality: Right;    Family History  Problem Relation Age of Onset   Breast cancer Sister 41   Breast cancer Other     Allergies  Allergen Reactions   Lisinopril-Hydrochlorothiazide Other (See Comments)       Latest Ref Rng & Units 05/19/2023    5:45 AM 05/18/2023    5:44 AM 05/17/2023    4:12 PM  CBC  WBC 4.0 - 10.5 K/uL 6.8  7.5  8.9   Hemoglobin 12.0 - 15.0 g/dL 8.6  8.0  8.6   Hematocrit 36.0 - 46.0 % 25.9  23.6  25.1   Platelets 150 - 400 K/uL 408  356  372       CMP     Component Value Date/Time   NA 134 (L) 05/18/2023 0544   NA 138 11/22/2022 1042   K 3.6 05/18/2023 0544   CL 102 05/18/2023 0544   CO2 24 05/18/2023 0544   GLUCOSE 122 (H) 05/18/2023 0544   BUN 11 05/18/2023 0544   BUN 18 11/22/2022 1042   CREATININE 0.51 05/18/2023 0544   CALCIUM  8.3 (L) 05/18/2023 0544   PROT 8.8 (H) 05/10/2023 1624   PROT 7.1 11/22/2022 1042   ALBUMIN 3.3 (L) 05/10/2023 1624   ALBUMIN 4.1 11/22/2022 1042   AST 20 05/10/2023 1624   ALT 11 05/10/2023 1624   ALKPHOS 64 05/10/2023 1624   BILITOT 0.9 05/10/2023 1624   BILITOT 0.3 11/22/2022 1042   EGFR 94 11/22/2022 1042   GFRNONAA >60 05/18/2023 0544     No results found.     Assessment & Plan:   1. BKA stump complication (HCC) (Primary) Patients stump is  well healed. She has scabs in 3 areas across the incision line but with no skin breakdown. No drainage to note. Positive dependant edema. Wrapped her stump with coban today to assist with swelling. Patient always complains of pain. Patient given last pain prescription today. She will need to follow up with her PCP for further pain management.   Slight edema to right lower extremity. Told to wear compression. She is NON COMPLIANT with this as well.   [facility] referral today.   2. Essential hypertension Continue antihypertensive medications as already ordered, these medications have been reviewed and there are no changes at this time.  3. Mixed hyperlipidemia Continue statin as ordered and reviewed, no changes at this time   Current Outpatient Medications on File Prior to Visit  Medication Sig Dispense Refill   acetaminophen  (TYLENOL ) 325 MG tablet Take 2 tablets (650 mg total) by mouth every 6 (six) hours as needed for mild pain (pain score 1-3) (or Fever >/= 101).     albuterol  (VENTOLIN  HFA) 108 (90 Base) MCG/ACT inhaler Inhale 2 puffs into the lungs every 6 (six) hours as needed for wheezing or shortness of breath. 18 g 6   amLODipine  (NORVASC ) 5 MG tablet Take 1 tablet (5 mg total) by mouth daily.     aspirin  EC 81 MG tablet Take 1 tablet (81 mg total) by mouth daily. Swallow whole. 30 tablet 0   clopidogrel  (PLAVIX ) 75 MG tablet Take 1 tablet (75 mg total) by mouth daily. 30 tablet 6   dextromethorphan-guaiFENesin  (MUCINEX  DM) 30-600 MG 12hr tablet Take 1 tablet by mouth 2 (two) times daily as needed for cough.     docusate  sodium (COLACE) 100 MG capsule Take 1 capsule (100 mg total) by mouth 2 (two) times daily.     DULoxetine  (CYMBALTA ) 30 MG capsule Take 1 capsule (30 mg total) by mouth daily. 30 capsule 2   ferrous sulfate  325 (65 FE) MG tablet Take 1 tablet (325 mg total) by mouth daily with breakfast. 30 tablet 0   furosemide  (LASIX ) 20 MG tablet Take 1 tablet (20 mg total) by mouth  daily as needed for edema. 30 tablet 0   gabapentin  (NEURONTIN ) 600 MG tablet Take 1 tablet (600 mg total) by mouth daily. 30 tablet 2   ipratropium-albuterol  (DUONEB) 0.5-2.5 (3) MG/3ML SOLN Take 3 mLs by nebulization every 6 (six) hours as needed. 360 mL 3   nicotine  (NICODERM CQ  - DOSED IN MG/24 HOURS) 21 mg/24hr patch Place 1 patch (21 mg total) onto the skin daily.     oxyCODONE  (OXY IR/ROXICODONE ) 5 MG immediate release tablet Take 1 tablet (5 mg total) by mouth every 6 (six) hours as needed for severe pain (pain score 7-10). 30 tablet 0   pantoprazole  (PROTONIX ) 40 MG tablet Take 1 tablet (40 mg total) by mouth every morning. 90 tablet 3   polyethylene glycol (MIRALAX  / GLYCOLAX ) 17 g packet Take 17 g by mouth daily as needed. 30 each 0   rosuvastatin  (CRESTOR ) 20 MG tablet Take 1 tablet (20 mg total) by mouth daily. 90 tablet 3   sulfamethoxazole -trimethoprim  (BACTRIM  DS) 800-160 MG tablet Take 1 tablet by mouth 2 (two) times daily. 20 tablet 0   Vitamin D , Ergocalciferol , (DRISDOL ) 1.25 MG (50000 UNIT) CAPS capsule Take 1 capsule (50,000 Units total) by mouth every 7 (seven) days. 12 capsule 3   No current facility-administered medications on file prior to visit.    There are no Patient Instructions on file for this visit. No follow-ups on file.   Gwendlyn JONELLE Shank, NP

## 2023-10-19 ENCOUNTER — Ambulatory Visit: Admitting: Family

## 2023-10-19 ENCOUNTER — Encounter: Payer: Self-pay | Admitting: Family

## 2023-10-19 VITALS — BP 134/74 | Ht 66.0 in | Wt 126.0 lb

## 2023-10-19 DIAGNOSIS — E782 Mixed hyperlipidemia: Secondary | ICD-10-CM

## 2023-10-19 DIAGNOSIS — E538 Deficiency of other specified B group vitamins: Secondary | ICD-10-CM | POA: Diagnosis not present

## 2023-10-19 DIAGNOSIS — M1A00X0 Idiopathic chronic gout, unspecified site, without tophus (tophi): Secondary | ICD-10-CM

## 2023-10-19 DIAGNOSIS — Z89512 Acquired absence of left leg below knee: Secondary | ICD-10-CM | POA: Diagnosis not present

## 2023-10-19 DIAGNOSIS — E559 Vitamin D deficiency, unspecified: Secondary | ICD-10-CM

## 2023-10-19 DIAGNOSIS — I10 Essential (primary) hypertension: Secondary | ICD-10-CM | POA: Diagnosis not present

## 2023-10-19 DIAGNOSIS — D509 Iron deficiency anemia, unspecified: Secondary | ICD-10-CM | POA: Diagnosis not present

## 2023-10-19 DIAGNOSIS — R5383 Other fatigue: Secondary | ICD-10-CM | POA: Diagnosis not present

## 2023-10-19 DIAGNOSIS — N3946 Mixed incontinence: Secondary | ICD-10-CM

## 2023-10-19 DIAGNOSIS — R7303 Prediabetes: Secondary | ICD-10-CM

## 2023-10-19 MED ORDER — OXYCODONE HCL 5 MG PO TABS: 5.0000 mg | ORAL_TABLET | ORAL | 0 refills | Status: DC | PRN

## 2023-10-19 NOTE — Patient Instructions (Signed)
 1) Sent refill for your pain medications. 2) Will call with update regarding your supplies from [employer] 3) [facility] will call you to set up physical therapy 4) Sending an order to [employer] for a motorized wheelchair for you.  They do have lightweight options.

## 2023-10-19 NOTE — Progress Notes (Signed)
 Established Patient Office Visit Subjective:  Patient ID: Jackie Macias, female    DOB: [birth year]  Age: 68 y.o. MRN: [id number]  Chief Complaint  Patient presents with   Hip Pain    Left    Patient is currently under my care for decreased mobility 2/2 a  (e.g., rehabilitation, mobility impairment). This has resulted in a significant lack of mobility, impairing the ability to safely and timely perform activities of daily living (ADLs) in the home. Cane or Walker: A cane or walker will not sufficiently meet the patient's mobility needs due to poor balance, documented history of falls, and generalized weakness.  Manual Wheelchair: The patient cannot operate a manual wheelchair due to upper extremity weakness.  Justification: A lightweight power wheelchair with cushion and back will provide the necessary independence for mobility within the home environment, including safe access to the bathroom and kitchen.  [employer] says they sent her order out on 7/19, but she hasn't gotten anything yet.   She needs incontinence supplies as well, asks if we can work on getting her some pull ups, size medium, some exam gloves, and some wipes to help protect her skin.  She needs to be changed approximately 4 times daily. She is likely to need these supplies permanently, as her condition is unlikely to improve.  Needs home health PT.   Hip Pain  The incident occurred more than 1 week ago. There was no injury mechanism. The pain is present in the right hip and left hip. The quality of the pain is described as aching and stabbing. The pain is at a severity of 10/10. The pain is severe. The pain has been Constant since onset. Associated symptoms include an inability to bear weight, a loss of motion and muscle weakness. She reports no foreign bodies present. The symptoms are aggravated by movement and weight bearing. She has tried acetaminophen , elevation, heat, non-weight bearing, NSAIDs, immobilization, rest  and ice for the symptoms. The treatment provided no relief.    No other concerns at this time.   Past Medical History:  Diagnosis Date   Arthritis    Bronchitis    COPD (chronic obstructive pulmonary disease) (HCC)    GERD (gastroesophageal reflux disease)    Gout    Hypertension     Past Surgical History:  Procedure Laterality Date   AMPUTATION Right 07/29/2017   Procedure: AMPUTATION DIGIT/ 71174;  Surgeon: Neill Boas, DPM;  Location: [facility];  Service: Podiatry;  Laterality: Right;   AMPUTATION Left 05/13/2023   Procedure: AMPUTATION BELOW KNEE;  Surgeon: Jama Cordella MATSU, MD;  Location: [facility];  Service: Vascular;  Laterality: Left;   AMPUTATION TOE Right 02/18/2021   Procedure: AMPUTATION TOE-GREAT TOE;  Surgeon: Ashley Soulier, DPM;  Location: [facility];  Service: Podiatry;  Laterality: Right;   CHOLECYSTECTOMY     facial biopsy Right    JOINT REPLACEMENT Right 12/2016   THR   LOWER EXTREMITY ANGIOGRAPHY Right 09/27/2017   Procedure: LOWER EXTREMITY ANGIOGRAPHY;  Surgeon: Jama Cordella MATSU, MD;  Location: [facility];  Service: Cardiovascular;  Laterality: Right;   LOWER EXTREMITY ANGIOGRAPHY Right 06/17/2020   Procedure: LOWER EXTREMITY ANGIOGRAPHY;  Surgeon: Jama Cordella MATSU, MD;  Location: [facility];  Service: Cardiovascular;  Laterality: Right;   LOWER EXTREMITY ANGIOGRAPHY Right 02/18/2021   Procedure: LOWER EXTREMITY ANGIOGRAPHY;  Surgeon: Jama Cordella MATSU, MD;  Location: [facility];  Service: Cardiovascular;  Laterality: Right;   LOWER EXTREMITY ANGIOGRAPHY Left  09/07/2022   Procedure: Lower Extremity Angiography;  Surgeon: Jama Cordella MATSU, MD;  Location: [facility];  Service: Cardiovascular;  Laterality: Left;   LOWER EXTREMITY ANGIOGRAPHY Left 03/07/2023   Procedure: Lower Extremity Angiography;  Surgeon: Marea Selinda RAMAN, MD;  Location: [facility];  Service: Cardiovascular;  Laterality: Left;   LOWER EXTREMITY  ANGIOGRAPHY Left 03/08/2023   Procedure: Lower Extremity Angiography;  Surgeon: Jama Cordella MATSU, MD;  Location: [facility];  Service: Cardiovascular;  Laterality: Left;   LOWER EXTREMITY ANGIOGRAPHY Left 05/11/2023   Procedure: Lower Extremity Angiography;  Surgeon: Jama Cordella MATSU, MD;  Location: [facility];  Service: Cardiovascular;  Laterality: Left;   TONSILLECTOMY     TOTAL HIP ARTHROPLASTY Right 01/17/2017   Procedure: TOTAL HIP ARTHROPLASTY ANTERIOR APPROACH;  Surgeon: Leora Lynwood SAUNDERS, MD;  Location: [facility];  Service: Orthopedics;  Laterality: Right;    Social History   Socioeconomic History   Marital status: Single    Spouse name: Not on file   Number of children: Not on file   Years of education: Not on file   Highest education level: Not on file  Occupational History   Not on file  Tobacco Use   Smoking status: Every Day    Current packs/day: 1.00    Average packs/day: 1 pack/day for 50.0 years (50.0 ttl pk-yrs)    Types: Cigarettes   Smokeless tobacco: Never  Vaping Use   Vaping status: Never Used  Substance and Sexual Activity   Alcohol use: Yes    Alcohol/week: 3.0 standard drinks of alcohol    Types: 3 Cans of beer per week    Comment: occasional   Drug use: No   Sexual activity: Not on file  Other Topics Concern   Not on file  Social History Narrative   Not on file   Social Drivers of Health   Financial Resource Strain: Not on file  Food Insecurity: No Food Insecurity (05/11/2023)   Hunger Vital Sign    Worried About Running Out of Food in the Last Year: Never true    Ran Out of Food in the Last Year: Never true  Transportation Needs: No Transportation Needs (05/11/2023)   PRAPARE - Administrator, Civil Service (Medical): No    Lack of Transportation (Non-Medical): No  Physical Activity: Not on file  Stress: Not on file  Social Connections: Unknown (05/11/2023)   Social Connection and Isolation Panel    Frequency  of Communication with Friends and Family: Patient unable to answer    Frequency of Social Gatherings with Friends and Family: Patient unable to answer    Attends Religious Services: Patient declined    Active Member of Clubs or Organizations: Patient declined    Attends Banker Meetings: Patient declined    Marital Status: Patient declined  Intimate Partner Violence: Not At Risk (05/11/2023)   Humiliation, Afraid, Rape, and Kick questionnaire    Fear of Current or Ex-Partner: No    Emotionally Abused: No    Physically Abused: No    Sexually Abused: No    Family History  Problem Relation Age of Onset   Breast cancer Sister 81   Breast cancer Other     Allergies  Allergen Reactions   Lisinopril-Hydrochlorothiazide Other (See Comments)    Review of Systems  Gastrointestinal:        Incontinence  Genitourinary:        Incontinence w/o sensory awareness  Musculoskeletal:  Positive for joint pain.  Neurological:  Positive for sensory change and weakness.  All other systems reviewed and are negative.      Objective:   BP 134/74   Ht 5' 6 (1.676 m)   Wt 126 lb (57.2 kg) Comment: Patient reported, unable to weigh  BMI 20.34 kg/m   Vitals:   10/19/23 0955  BP: 134/74  Height: 5' 6 (1.676 m)  Weight: 126 lb (57.2 kg) Comment: Patient reported, unable to weigh  BMI (Calculated): 20.35    Physical Exam Vitals and nursing note reviewed.  Constitutional:      Appearance: Normal appearance. She is normal weight.  HENT:     Head: Normocephalic.  Eyes:     Extraocular Movements: Extraocular movements intact.     Conjunctiva/sclera: Conjunctivae normal.     Pupils: Pupils are equal, round, and reactive to light.  Cardiovascular:     Rate and Rhythm: Normal rate.  Pulmonary:     Effort: Pulmonary effort is normal.  Musculoskeletal:     Right lower leg: Swelling present. 3+ Edema present.     Left Lower Extremity: Left leg is amputated below knee.   Neurological:     General: No focal deficit present.     Mental Status: She is alert and oriented to person, place, and time. Mental status is at baseline.  Psychiatric:        Mood and Affect: Mood normal.        Behavior: Behavior normal.        Thought Content: Thought content normal.      No results found for any visits on 10/19/23.  Recent Results (from the past 2160 hours)  Aerobic culture     Status: Abnormal   Collection Time: 07/22/23  1:14 PM  Result Value Ref Range   Aerobic Bacterial Culture Final report (A)    Organism ID, Bacteria Proteus mirabilis (A)     Comment: Heavy growth   Organism ID, Bacteria Morganella morganii (A)     Comment: Multi-Drug Resistant Organism Heavy growth    Organism ID, Bacteria Routine flora     Comment: Heavy growth   Antimicrobial Susceptibility Comment     Comment:       ** S = Susceptible; I = Intermediate; R = Resistant **                    P = Positive; N = Negative             MICS are expressed in micrograms per mL    Antibiotic                 RSLT#1    RSLT#2    RSLT#3    RSLT#4 Amoxicillin /Clavulanic Acid    S         R Ampicillin                     S         R Cefazolin                       I Cefepime                       S Cefoxitin                      S         I Cefpodoxime  S Ceftriaxone                     S Ciprofloxacin                  S         S Ertapenem                      S         S Gentamicin                     S Levofloxacin                   S         S Meropenem                      S         S Piperacillin /Tazobactam        S         S Tetracycline                   R         R Tobramycin                     S         S Trimethoprim /Sulfa              S         S        Assessment & Plan S/P BKA (below knee amputation) unilateral, left (HCC) Idiopathic chronic gout without tophus, unspecified site Sending order for Power Chair for pt.  Will also set her up for home  PT.   Continue current other therapies.   Mixed hyperlipidemia Checking labs today.  Continue current therapy for lipid control. Will modify as needed based on labwork results.   -CMP w/eGFR -Lipid Panel  Prediabetes A1C Continues to be in prediabetic ranges.  Will reassess at follow up after next lab check.  Patient counseled on dietary choices and verbalized understanding.   -CBC w/Diff -CMP w/eGFR -Hemoglobin A1C  Essential hypertension Blood pressure well controlled with current medications.  Continue current therapy.  Will reassess at follow up.   - CBC w/Diff - CMP w/eGFR  Vitamin D  deficiency, unspecified B12 deficiency due to diet Iron deficiency anemia, unspecified iron deficiency anemia type Other fatigue Checking labs today.  Will continue supplements as needed.   - Vitamin D  - Vitamin B12 - TSH  Mixed stress and urge urinary incontinence Sending incontinence supplies for patient.  These supplies are necessary to prevent negative outcomes.      Return in about 3 months (around 01/19/2024).   Total time spent: 30 minutes  ALAN CHRISTELLA ARRANT, FNP  10/19/2023   This document may have been prepared by [facility] Voice Recognition software and as such may include unintentional dictation errors.

## 2023-10-20 LAB — CBC WITH DIFFERENTIAL/PLATELET
Basophils Absolute: 0 x10E3/uL (ref 0.0–0.2)
Basos: 0 %
EOS (ABSOLUTE): 0 x10E3/uL (ref 0.0–0.4)
Eos: 1 %
Hematocrit: 35.7 % (ref 34.0–46.6)
Hemoglobin: 11.2 g/dL (ref 11.1–15.9)
Immature Grans (Abs): 0 x10E3/uL (ref 0.0–0.1)
Immature Granulocytes: 0 %
Lymphocytes Absolute: 2 x10E3/uL (ref 0.7–3.1)
Lymphs: 40 %
MCH: 32.6 pg (ref 26.6–33.0)
MCHC: 31.4 g/dL — ABNORMAL LOW (ref 31.5–35.7)
MCV: 104 fL — ABNORMAL HIGH (ref 79–97)
Monocytes Absolute: 0.6 x10E3/uL (ref 0.1–0.9)
Monocytes: 12 %
Neutrophils Absolute: 2.4 x10E3/uL (ref 1.4–7.0)
Neutrophils: 47 %
Platelets: 302 x10E3/uL (ref 150–450)
RBC: 3.44 x10E6/uL — ABNORMAL LOW (ref 3.77–5.28)
RDW: 15.2 % (ref 11.7–15.4)
WBC: 5 x10E3/uL (ref 3.4–10.8)

## 2023-10-20 LAB — CMP14+EGFR
ALT: 9 [IU]/L (ref 0–32)
AST: 21 [IU]/L (ref 0–40)
Albumin: 4.1 g/dL (ref 3.9–4.9)
Alkaline Phosphatase: 85 [IU]/L (ref 44–121)
BUN/Creatinine Ratio: 20 (ref 12–28)
BUN: 12 mg/dL (ref 8–27)
Bilirubin Total: 0.5 mg/dL (ref 0.0–1.2)
CO2: 17 mmol/L — ABNORMAL LOW (ref 20–29)
Calcium: 9.9 mg/dL (ref 8.7–10.3)
Chloride: 104 mmol/L (ref 96–106)
Creatinine, Ser: 0.61 mg/dL (ref 0.57–1.00)
Globulin, Total: 3.3 g/dL (ref 1.5–4.5)
Glucose: 92 mg/dL (ref 70–99)
Potassium: 4 mmol/L (ref 3.5–5.2)
Sodium: 141 mmol/L (ref 134–144)
Total Protein: 7.4 g/dL (ref 6.0–8.5)
eGFR: 97 mL/min/{1.73_m2}

## 2023-10-20 LAB — LIPID PANEL
Chol/HDL Ratio: 3.1 ratio (ref 0.0–4.4)
Cholesterol, Total: 170 mg/dL (ref 100–199)
HDL: 54 mg/dL
LDL Chol Calc (NIH): 104 mg/dL — ABNORMAL HIGH (ref 0–99)
Triglycerides: 60 mg/dL (ref 0–149)
VLDL Cholesterol Cal: 12 mg/dL (ref 5–40)

## 2023-10-20 LAB — VITAMIN B12: Vitamin B-12: 476 pg/mL (ref 232–1245)

## 2023-10-20 LAB — HEMOGLOBIN A1C
Est. average glucose Bld gHb Est-mCnc: 108 mg/dL
Hgb A1c MFr Bld: 5.4 % (ref 4.8–5.6)

## 2023-10-20 LAB — TSH: TSH: 1.33 u[IU]/mL (ref 0.450–4.500)

## 2023-10-20 LAB — VITAMIN D 25 HYDROXY (VIT D DEFICIENCY, FRACTURES): Vit D, 25-Hydroxy: 21 ng/mL — ABNORMAL LOW (ref 30.0–100.0)

## 2023-10-26 ENCOUNTER — Telehealth: Payer: Self-pay | Admitting: Family

## 2023-10-26 NOTE — Telephone Encounter (Signed)
 PT called wanting to kow that status of her mobilized wheelchair that was discussed at last visit

## 2023-10-27 ENCOUNTER — Telehealth: Payer: Self-pay

## 2023-10-27 NOTE — Telephone Encounter (Signed)
 Pt left voicemail for a call back but she wants to make an appt for her hip.

## 2023-10-28 ENCOUNTER — Other Ambulatory Visit: Payer: Self-pay

## 2023-10-28 NOTE — Telephone Encounter (Signed)
 Per Alan she will send order for xray

## 2023-11-10 ENCOUNTER — Telehealth: Payer: Self-pay | Admitting: Family

## 2023-11-10 ENCOUNTER — Telehealth (INDEPENDENT_AMBULATORY_CARE_PROVIDER_SITE_OTHER): Payer: Self-pay | Admitting: Nurse Practitioner

## 2023-11-10 NOTE — Telephone Encounter (Signed)
 Patient called stating she had a fall on Tuesday night and it caused her legs to drain a little bit. She has an appointment with [facility] on Wednesday. She is not sure if she should come to us  to be seen first before she goes to [facility]. Please advise.

## 2023-11-10 NOTE — Telephone Encounter (Signed)
 Patient called in stating that [facility] was sending us  over a fax for her power wheel chair. We received it this morning and I placed it in [name]'s inbox for signing.  She also states she is switching from getting her incontinence supplies from [employer] to [employer].

## 2023-11-11 ENCOUNTER — Other Ambulatory Visit (INDEPENDENT_AMBULATORY_CARE_PROVIDER_SITE_OTHER): Payer: Self-pay | Admitting: Nurse Practitioner

## 2023-11-11 MED ORDER — OXYCODONE HCL 5 MG PO TABS: 5.0000 mg | ORAL_TABLET | Freq: Four times a day (QID) | ORAL | 0 refills | Status: DC | PRN

## 2023-11-11 NOTE — Telephone Encounter (Signed)
 We can have her come in to look

## 2023-11-11 NOTE — Telephone Encounter (Signed)
 Patient called again asking for call. She also now states she needs pain pills

## 2023-11-15 ENCOUNTER — Other Ambulatory Visit (INDEPENDENT_AMBULATORY_CARE_PROVIDER_SITE_OTHER): Payer: Self-pay | Admitting: Vascular Surgery

## 2023-11-15 ENCOUNTER — Ambulatory Visit (INDEPENDENT_AMBULATORY_CARE_PROVIDER_SITE_OTHER): Admitting: Vascular Surgery

## 2023-11-15 ENCOUNTER — Encounter (INDEPENDENT_AMBULATORY_CARE_PROVIDER_SITE_OTHER): Payer: Self-pay | Admitting: Vascular Surgery

## 2023-11-15 VITALS — BP 184/73 | HR 76 | Resp 16

## 2023-11-15 DIAGNOSIS — T879 Unspecified complications of amputation stump: Secondary | ICD-10-CM

## 2023-11-15 DIAGNOSIS — T8740 Infection of amputation stump, unspecified extremity: Secondary | ICD-10-CM | POA: Diagnosis not present

## 2023-11-15 DIAGNOSIS — E782 Mixed hyperlipidemia: Secondary | ICD-10-CM | POA: Diagnosis not present

## 2023-11-15 DIAGNOSIS — I10 Essential (primary) hypertension: Secondary | ICD-10-CM

## 2023-11-15 NOTE — Progress Notes (Signed)
 Subjective:    Patient ID: Jackie Macias, female    DOB: 1955/12/17, 68 y.o.   MRN: [id number] Chief Complaint  Patient presents with   Follow-up    Left stump draining from fall    The patient returns today for evaluation of wounds post left below-knee amputation after she states she fell out of her wheelchair.  This is the 4th and 5th time she claims that she has fallen out of her wheelchair.  There are no signs or symptoms that she is actually had a fall on her stump today.  It appears that she has been scratching her stump and has picked off a scab.  This area I cultured today to send off just to check but I do not believe I see any type of infection.  Home physical therapy was ordered on 09/2323 for the patient by the patient's internal medicine physician.  Patient endorses she is not receiving PT at home no one has contacted her.  The previous wounds have all healed. She is not being compliant with a pressure dressing to stump and therefore has edema.  Patient was scheduled to see [facility] on 11/09/23 but conveniently fell out of her wheelchair on 11/08/23 and thus called [facility] and canceled that appointment.  There is not been any development of significant drainage or development of new infection. Patient will continue as previous with ace bandage to reduce swelling.    Review of Systems  Constitutional: Negative.   Cardiovascular:  Positive for leg swelling.       Stump swelling due to noncompliance with use of Ace bandage  Skin:        Patient scratching stomach picked off a scab leaving open skin area with no depth or drainage.  All other systems reviewed and are negative.      Objective:   Physical Exam Constitutional:      Appearance: Normal appearance. She is obese.  HENT:     Head: Normocephalic.  Eyes:     Pupils: Pupils are equal, round, and reactive to light.  Cardiovascular:     Rate and Rhythm: Normal rate and regular rhythm.     Pulses: Normal  pulses.     Heart sounds: Normal heart sounds.  Pulmonary:     Effort: Pulmonary effort is normal.     Breath sounds: Normal breath sounds.  Abdominal:     General: Bowel sounds are normal.     Palpations: Abdomen is soft.  Musculoskeletal:     Right lower leg: Edema present.     Comments: Stump with noted edema due to noncompliance with Ace bandage wrap  Skin:    General: Skin is warm and dry.     Capillary Refill: Capillary refill takes 2 to 3 seconds.  Neurological:     General: No focal deficit present.     Mental Status: She is alert and oriented to person, place, and time. Mental status is at baseline.  Psychiatric:        Mood and Affect: Mood normal.        Behavior: Behavior normal.        Thought Content: Thought content normal.        Judgment: Judgment normal.     BP (!) 184/73   Pulse 76   Resp 16   Past Medical History:  Diagnosis Date   Arthritis    Bronchitis    COPD (chronic obstructive pulmonary disease) (HCC)    GERD (gastroesophageal  reflux disease)    Gout    Hypertension     Social History   Socioeconomic History   Marital status: Single    Spouse name: Not on file   Number of children: Not on file   Years of education: Not on file   Highest education level: Not on file  Occupational History   Not on file  Tobacco Use   Smoking status: Every Day    Current packs/day: 1.00    Average packs/day: 1 pack/day for 50.0 years (50.0 ttl pk-yrs)    Types: Cigarettes   Smokeless tobacco: Never  Vaping Use   Vaping status: Never Used  Substance and Sexual Activity   Alcohol use: Yes    Alcohol/week: 3.0 standard drinks of alcohol    Types: 3 Cans of beer per week    Comment: occasional   Drug use: No   Sexual activity: Not on file  Other Topics Concern   Not on file  Social History Narrative   Not on file   Social Drivers of Health   Financial Resource Strain: Not on file  Food Insecurity: No Food Insecurity (05/11/2023)   Hunger  Vital Sign    Worried About Running Out of Food in the Last Year: Never true    Ran Out of Food in the Last Year: Never true  Transportation Needs: No Transportation Needs (05/11/2023)   PRAPARE - Administrator, Civil Service (Medical): No    Lack of Transportation (Non-Medical): No  Physical Activity: Not on file  Stress: Not on file  Social Connections: Unknown (05/11/2023)   Social Connection and Isolation Panel    Frequency of Communication with Friends and Family: Patient unable to answer    Frequency of Social Gatherings with Friends and Family: Patient unable to answer    Attends Religious Services: Patient declined    Active Member of Clubs or Organizations: Patient declined    Attends Banker Meetings: Patient declined    Marital Status: Patient declined  Intimate Partner Violence: Not At Risk (05/11/2023)   Humiliation, Afraid, Rape, and Kick questionnaire    Fear of Current or Ex-Partner: No    Emotionally Abused: No    Physically Abused: No    Sexually Abused: No    Past Surgical History:  Procedure Laterality Date   AMPUTATION Right 07/29/2017   Procedure: AMPUTATION DIGIT/ 28825;  Surgeon: Neill Boas, DPM;  Location: [facility];  Service: Podiatry;  Laterality: Right;   AMPUTATION Left 05/13/2023   Procedure: AMPUTATION BELOW KNEE;  Surgeon: Jama Cordella MATSU, MD;  Location: [facility];  Service: Vascular;  Laterality: Left;   AMPUTATION TOE Right 02/18/2021   Procedure: AMPUTATION TOE-GREAT TOE;  Surgeon: Ashley Soulier, DPM;  Location: [facility];  Service: Podiatry;  Laterality: Right;   CHOLECYSTECTOMY     facial biopsy Right    JOINT REPLACEMENT Right 12/2016   THR   LOWER EXTREMITY ANGIOGRAPHY Right 09/27/2017   Procedure: LOWER EXTREMITY ANGIOGRAPHY;  Surgeon: Jama Cordella MATSU, MD;  Location: [facility];  Service: Cardiovascular;  Laterality: Right;   LOWER EXTREMITY ANGIOGRAPHY Right 06/17/2020   Procedure: LOWER EXTREMITY  ANGIOGRAPHY;  Surgeon: Jama Cordella MATSU, MD;  Location: [facility];  Service: Cardiovascular;  Laterality: Right;   LOWER EXTREMITY ANGIOGRAPHY Right 02/18/2021   Procedure: LOWER EXTREMITY ANGIOGRAPHY;  Surgeon: Jama Cordella MATSU, MD;  Location: [facility];  Service: Cardiovascular;  Laterality: Right;   LOWER EXTREMITY ANGIOGRAPHY Left  09/07/2022   Procedure: Lower Extremity Angiography;  Surgeon: Jama Cordella MATSU, MD;  Location: [facility] INVASIVE CV LAB;  Service: Cardiovascular;  Laterality: Left;   LOWER EXTREMITY ANGIOGRAPHY Left 03/07/2023   Procedure: Lower Extremity Angiography;  Surgeon: Marea Selinda RAMAN, MD;  Location: [facility];  Service: Cardiovascular;  Laterality: Left;   LOWER EXTREMITY ANGIOGRAPHY Left 03/08/2023   Procedure: Lower Extremity Angiography;  Surgeon: Jama Cordella MATSU, MD;  Location: [facility];  Service: Cardiovascular;  Laterality: Left;   LOWER EXTREMITY ANGIOGRAPHY Left 05/11/2023   Procedure: Lower Extremity Angiography;  Surgeon: Jama Cordella MATSU, MD;  Location: [facility];  Service: Cardiovascular;  Laterality: Left;   TONSILLECTOMY     TOTAL HIP ARTHROPLASTY Right 01/17/2017   Procedure: TOTAL HIP ARTHROPLASTY ANTERIOR APPROACH;  Surgeon: Leora Lynwood SAUNDERS, MD;  Location: [facility];  Service: Orthopedics;  Laterality: Right;    Family History  Problem Relation Age of Onset   Breast cancer Sister 103   Breast cancer Other     Allergies  Allergen Reactions   Lisinopril-Hydrochlorothiazide Other (See Comments)       Latest Ref Rng & Units 10/19/2023   11:05 AM 05/19/2023    5:45 AM 05/18/2023    5:44 AM  CBC  WBC 3.4 - 10.8 x10E3/uL 5.0  6.8  7.5   Hemoglobin 11.1 - 15.9 g/dL 88.7  8.6  8.0   Hematocrit 34.0 - 46.6 % 35.7  25.9  23.6   Platelets 150 - 450 x10E3/uL 302  408  356       CMP     Component Value Date/Time   NA 141 10/19/2023 1105   K 4.0 10/19/2023 1105   CL 104 10/19/2023 1105    CO2 17 (L) 10/19/2023 1105   GLUCOSE 92 10/19/2023 1105   GLUCOSE 122 (H) 05/18/2023 0544   BUN 12 10/19/2023 1105   CREATININE 0.61 10/19/2023 1105   CALCIUM  9.9 10/19/2023 1105   PROT 7.4 10/19/2023 1105   ALBUMIN 4.1 10/19/2023 1105   AST 21 10/19/2023 1105   ALT 9 10/19/2023 1105   ALKPHOS 85 10/19/2023 1105   BILITOT 0.5 10/19/2023 1105   EGFR 97 10/19/2023 1105   GFRNONAA >60 05/18/2023 0544     No results found.     Assessment & Plan:   1. BKA stump complication (HCC) (Primary) Patient comes to clinic today with chief complaint of pain to left AKA stump after falling out of her wheelchair for the fourth time.  There were no signs or symptoms of any type of fall or trauma or injury to her stump.  However there did appear to be scratching at which she scratched a scab off.  There was an underlying area below that scab that was removed it which I cultured today to make sure she does not have anything developing.  There are no signs or symptoms of infection hematoma seroma or trauma to her extremity.  As I stated in the last visit if the patient is requiring pain medication she needs to contact her PCP going forward.  Patient endorses she canceled her appointment to the [facility] clinic for prosthetic as she conveniently fell out of her wheelchair the day before that visit.  I endorsed that she is okay to go see [facility] and she needs to call and reschedule that appointment.  2. Essential hypertension Continue antihypertensive medications as already ordered, these medications have been reviewed and there are no changes at this  time.  3. Mixed hyperlipidemia Continue statin as ordered and reviewed, no changes at this time   Current Outpatient Medications on File Prior to Visit  Medication Sig Dispense Refill   albuterol  (VENTOLIN  HFA) 108 (90 Base) MCG/ACT inhaler Inhale 2 puffs into the lungs every 6 (six) hours as needed for wheezing or shortness of breath. 18 g 6    amLODipine  (NORVASC ) 5 MG tablet Take 1 tablet (5 mg total) by mouth daily.     clopidogrel  (PLAVIX ) 75 MG tablet Take 1 tablet (75 mg total) by mouth daily. 30 tablet 6   docusate sodium  (COLACE) 100 MG capsule Take 1 capsule (100 mg total) by mouth 2 (two) times daily.     furosemide  (LASIX ) 20 MG tablet Take 1 tablet (20 mg total) by mouth daily as needed for edema. 30 tablet 0   gabapentin  (NEURONTIN ) 600 MG tablet Take 1 tablet (600 mg total) by mouth daily. 30 tablet 2   oxyCODONE  (ROXICODONE ) 5 MG immediate release tablet Take 1 tablet (5 mg total) by mouth every 6 (six) hours as needed for severe pain (pain score 7-10). 15 tablet 0   pantoprazole  (PROTONIX ) 40 MG tablet Take 1 tablet (40 mg total) by mouth every morning. 90 tablet 3   rosuvastatin  (CRESTOR ) 20 MG tablet Take 1 tablet (20 mg total) by mouth daily. 90 tablet 3   aspirin  EC 81 MG tablet Take 1 tablet (81 mg total) by mouth daily. Swallow whole. (Patient not taking: Reported on 11/15/2023) 30 tablet 0   DULoxetine  (CYMBALTA ) 30 MG capsule Take 1 capsule (30 mg total) by mouth daily. (Patient not taking: Reported on 11/15/2023) 30 capsule 2   nicotine  (NICODERM CQ  - DOSED IN MG/24 HOURS) 21 mg/24hr patch Place 1 patch (21 mg total) onto the skin daily. (Patient not taking: Reported on 11/15/2023)     polyethylene glycol (MIRALAX  / GLYCOLAX ) 17 g packet Take 17 g by mouth daily as needed. (Patient not taking: Reported on 11/15/2023) 30 each 0   Vitamin D , Ergocalciferol , (DRISDOL ) 1.25 MG (50000 UNIT) CAPS capsule Take 1 capsule (50,000 Units total) by mouth every 7 (seven) days. (Patient not taking: Reported on 11/15/2023) 12 capsule 3   No current facility-administered medications on file prior to visit.    There are no Patient Instructions on file for this visit. No follow-ups on file.   Gwendlyn JONELLE Shank, NP

## 2023-11-18 LAB — AEROBIC CULTURE

## 2023-11-21 MED ORDER — SULFAMETHOXAZOLE-TRIMETHOPRIM 800-160 MG PO TABS: 1.0000 | ORAL_TABLET | Freq: Two times a day (BID) | ORAL | 0 refills | Status: DC

## 2023-11-21 NOTE — Addendum Note (Signed)
 Addended by: ELISABETH ROUND on: 11/21/2023 04:48 PM   Modules accepted: Orders

## 2023-11-23 NOTE — Progress Notes (Signed)
 Communicated with Gwendlyn Shank NP. Per NP pt had scab on stump that was cultured. Per Np they were aware of results. Orders being put in order for Bactrim  to treat  and will get chart flagged.

## 2023-11-23 NOTE — Progress Notes (Deleted)
 Received communication from Shona Sires, RN from [facility] Department regarding new CPO result from recent wound culture. Flag placed on chart.

## 2023-12-05 NOTE — Telephone Encounter (Signed)
 Patient left VM to see when we would be sending her power wheelchair paperwork to [facility].

## 2023-12-07 ENCOUNTER — Telehealth: Payer: Self-pay

## 2023-12-07 NOTE — Telephone Encounter (Signed)
 Pt is requesting 2 handicap parking placards.

## 2023-12-08 NOTE — Telephone Encounter (Signed)
 Printed typed and placed in the inbox for amanda to sign

## 2023-12-16 ENCOUNTER — Telehealth: Payer: Self-pay | Admitting: Family

## 2023-12-16 NOTE — Telephone Encounter (Signed)
 Patient left VM wanting to check the status of her paperwork and orders for her power wheelchair and prosthetic leg. Please advise the status of these.

## 2023-12-20 ENCOUNTER — Ambulatory Visit: Admitting: Family

## 2023-12-22 ENCOUNTER — Telehealth: Payer: Self-pay | Admitting: Family

## 2023-12-22 DIAGNOSIS — Z89512 Acquired absence of left leg below knee: Secondary | ICD-10-CM

## 2023-12-22 NOTE — Telephone Encounter (Signed)
 Patient left VM requesting we send a referral to [facility] for PT so she can get her prosthetic leg. Please place referral.

## 2023-12-26 ENCOUNTER — Other Ambulatory Visit: Payer: Self-pay | Admitting: Family

## 2023-12-26 DIAGNOSIS — M79605 Pain in left leg: Secondary | ICD-10-CM

## 2023-12-28 MED ORDER — CLOPIDOGREL BISULFATE 75 MG PO TABS: 75.0000 mg | ORAL_TABLET | Freq: Every day | ORAL | 6 refills | Status: AC

## 2023-12-28 MED ORDER — GABAPENTIN 600 MG PO TABS: 600.0000 mg | ORAL_TABLET | Freq: Every day | ORAL | 2 refills | Status: AC

## 2023-12-28 MED ORDER — OXYCODONE HCL 5 MG PO TABS: 5.0000 mg | ORAL_TABLET | Freq: Four times a day (QID) | ORAL | 0 refills | Status: DC | PRN

## 2023-12-28 MED ORDER — PANTOPRAZOLE SODIUM 40 MG PO TBEC: 40.0000 mg | DELAYED_RELEASE_TABLET | Freq: Every morning | ORAL | 3 refills | Status: AC

## 2023-12-28 MED ORDER — DOCUSATE SODIUM 100 MG PO CAPS: 100.0000 mg | ORAL_CAPSULE | Freq: Two times a day (BID) | ORAL | Status: AC

## 2023-12-28 MED ORDER — FUROSEMIDE 20 MG PO TABS: 20.0000 mg | ORAL_TABLET | Freq: Every day | ORAL | 0 refills | Status: AC | PRN

## 2023-12-28 MED ORDER — ROSUVASTATIN CALCIUM 20 MG PO TABS: 20.0000 mg | ORAL_TABLET | Freq: Every day | ORAL | 3 refills | Status: AC

## 2023-12-28 MED ORDER — AMLODIPINE BESYLATE 5 MG PO TABS: 5.0000 mg | ORAL_TABLET | Freq: Every day | ORAL | Status: DC

## 2024-01-04 ENCOUNTER — Other Ambulatory Visit: Payer: Self-pay | Admitting: Family

## 2024-01-04 DIAGNOSIS — Z1231 Encounter for screening mammogram for malignant neoplasm of breast: Secondary | ICD-10-CM

## 2024-01-05 ENCOUNTER — Other Ambulatory Visit: Payer: Self-pay | Admitting: Family

## 2024-01-05 DIAGNOSIS — N3946 Mixed incontinence: Secondary | ICD-10-CM

## 2024-01-05 DIAGNOSIS — Z89512 Acquired absence of left leg below knee: Secondary | ICD-10-CM

## 2024-01-06 MED ORDER — MOIST WIPES FLUSHABLE MISC: 3.0000 | Freq: Every day | 30 refills | Status: AC | PRN

## 2024-01-06 MED ORDER — INCONTINENCE BRIEF LARGE MISC: 1.0000 | Freq: Every day | 11 refills | Status: AC

## 2024-01-06 MED ORDER — CAREMATES NITRILE GLOVES MED MISC: 2.0000 | Freq: Every day | 11 refills | Status: AC

## 2024-01-06 NOTE — Addendum Note (Signed)
 Addended by: ORLEAN PALMA on: 01/06/2024 03:40 PM   Modules accepted: Orders

## 2024-01-06 NOTE — Assessment & Plan Note (Signed)
 Sending order for Power Chair for pt.  Will also set her up for home PT.   Continue current other therapies.

## 2024-01-06 NOTE — Assessment & Plan Note (Signed)
 Blood pressure well controlled with current medications.  Continue current therapy.  Will reassess at follow up.   - CBC w/Diff - CMP w/eGFR

## 2024-01-06 NOTE — Assessment & Plan Note (Signed)
 Checking labs today.  Will continue supplements as needed.   - Vitamin D  - Vitamin B12 - TSH

## 2024-01-06 NOTE — Assessment & Plan Note (Signed)
.  A1C Continues to be in prediabetic ranges.  Will reassess at follow up after next lab check.  Patient counseled on dietary choices and verbalized understanding.   -CBC w/Diff -CMP w/eGFR -Hemoglobin A1C

## 2024-01-06 NOTE — Assessment & Plan Note (Signed)
 Checking labs today.  Continue current therapy for lipid control. Will modify as needed based on labwork results.   -CMP w/eGFR -Lipid Panel

## 2024-01-13 ENCOUNTER — Ambulatory Visit (INDEPENDENT_AMBULATORY_CARE_PROVIDER_SITE_OTHER): Admitting: Family

## 2024-01-13 ENCOUNTER — Encounter: Payer: Self-pay | Admitting: Family

## 2024-01-13 DIAGNOSIS — I739 Peripheral vascular disease, unspecified: Secondary | ICD-10-CM

## 2024-01-13 DIAGNOSIS — M1611 Unilateral primary osteoarthritis, right hip: Secondary | ICD-10-CM | POA: Diagnosis not present

## 2024-01-13 DIAGNOSIS — N3946 Mixed incontinence: Secondary | ICD-10-CM

## 2024-01-13 DIAGNOSIS — Z89512 Acquired absence of left leg below knee: Secondary | ICD-10-CM

## 2024-01-13 DIAGNOSIS — I10 Essential (primary) hypertension: Secondary | ICD-10-CM

## 2024-01-13 DIAGNOSIS — Z96641 Presence of right artificial hip joint: Secondary | ICD-10-CM

## 2024-01-13 DIAGNOSIS — R7303 Prediabetes: Secondary | ICD-10-CM

## 2024-01-13 MED ORDER — OXYCODONE HCL 5 MG PO TABS: 5.0000 mg | ORAL_TABLET | Freq: Four times a day (QID) | ORAL | 0 refills | Status: DC | PRN

## 2024-01-13 MED ORDER — SYMBRAVO 20-10 MG PO TABS: 1.0000 | ORAL_TABLET | Freq: Every day | ORAL | 3 refills | Status: DC | PRN

## 2024-01-15 DIAGNOSIS — Z89512 Acquired absence of left leg below knee: Secondary | ICD-10-CM | POA: Insufficient documentation

## 2024-01-15 DIAGNOSIS — N3946 Mixed incontinence: Secondary | ICD-10-CM | POA: Insufficient documentation

## 2024-01-15 NOTE — Progress Notes (Signed)
 Established Patient Office Visit  Subjective:  Patient ID: Jackie Macias, female    DOB: September 24, 1955  Age: 68 y.o. MRN: [id number]  Chief Complaint  Patient presents with   Felton Rasmussen out of wheelchair on 01/04/24    Patient is here for follow up, recently fell from the motorized wheelchair she has been using.  She has a bump on her forehead,    Fall The accident occurred 5 to 7 days ago. Fall occurred: from a motorized scooter. She fell from a height of 3 to 5 ft. She landed on Hard floor. There was no blood loss. The point of impact was the right shoulder and face. The pain is present in the right shoulder. The pain is mild. The symptoms are aggravated by use of injured limb. Pertinent negatives include no headaches or loss of consciousness. She has tried acetaminophen  and NSAID for the symptoms.    No other concerns at this time.   Past Medical History:  Diagnosis Date   Arthritis    Bronchitis    COPD (chronic obstructive pulmonary disease) (HCC)    GERD (gastroesophageal reflux disease)    Gout    Hypertension     Past Surgical History:  Procedure Laterality Date   AMPUTATION Right 07/29/2017   Procedure: AMPUTATION DIGIT/ 71174;  Surgeon: Neill Boas, DPM;  Location: [facility];  Service: Podiatry;  Laterality: Right;   AMPUTATION Left 05/13/2023   Procedure: AMPUTATION BELOW KNEE;  Surgeon: Jama Cordella MATSU, MD;  Location: [facility];  Service: Vascular;  Laterality: Left;   AMPUTATION TOE Right 02/18/2021   Procedure: AMPUTATION TOE-GREAT TOE;  Surgeon: Ashley Soulier, DPM;  Location: [facility];  Service: Podiatry;  Laterality: Right;   CHOLECYSTECTOMY     facial biopsy Right    JOINT REPLACEMENT Right 12/2016   THR   LOWER EXTREMITY ANGIOGRAPHY Right 09/27/2017   Procedure: LOWER EXTREMITY ANGIOGRAPHY;  Surgeon: Jama Cordella MATSU, MD;  Location: [facility];  Service: Cardiovascular;  Laterality: Right;   LOWER EXTREMITY ANGIOGRAPHY Right 06/17/2020    Procedure: LOWER EXTREMITY ANGIOGRAPHY;  Surgeon: Jama Cordella MATSU, MD;  Location: [facility];  Service: Cardiovascular;  Laterality: Right;   LOWER EXTREMITY ANGIOGRAPHY Right 02/18/2021   Procedure: LOWER EXTREMITY ANGIOGRAPHY;  Surgeon: Jama Cordella MATSU, MD;  Location: [facility];  Service: Cardiovascular;  Laterality: Right;   LOWER EXTREMITY ANGIOGRAPHY Left 09/07/2022   Procedure: Lower Extremity Angiography;  Surgeon: Jama Cordella MATSU, MD;  Location: [facility];  Service: Cardiovascular;  Laterality: Left;   LOWER EXTREMITY ANGIOGRAPHY Left 03/07/2023   Procedure: Lower Extremity Angiography;  Surgeon: Marea Selinda RAMAN, MD;  Location: [facility];  Service: Cardiovascular;  Laterality: Left;   LOWER EXTREMITY ANGIOGRAPHY Left 03/08/2023   Procedure: Lower Extremity Angiography;  Surgeon: Jama Cordella MATSU, MD;  Location: [facility];  Service: Cardiovascular;  Laterality: Left;   LOWER EXTREMITY ANGIOGRAPHY Left 05/11/2023   Procedure: Lower Extremity Angiography;  Surgeon: Jama Cordella MATSU, MD;  Location: [facility];  Service: Cardiovascular;  Laterality: Left;   TONSILLECTOMY     TOTAL HIP ARTHROPLASTY Right 01/17/2017   Procedure: TOTAL HIP ARTHROPLASTY ANTERIOR APPROACH;  Surgeon: Leora Lynwood SAUNDERS, MD;  Location: [facility];  Service: Orthopedics;  Laterality: Right;    Social History   Socioeconomic History   Marital status: Single    Spouse name: Not on file   Number of children: Not on  file   Years of education: Not on file   Highest education level: Not on file  Occupational History   Not on file  Tobacco Use   Smoking status: Every Day    Current packs/day: 1.00    Average packs/day: 1 pack/day for 50.0 years (50.0 ttl pk-yrs)    Types: Cigarettes   Smokeless tobacco: Never  Vaping Use   Vaping status: Never Used  Substance and Sexual Activity   Alcohol use: Yes    Alcohol/week: 3.0 standard drinks of alcohol     Types: 3 Cans of beer per week    Comment: occasional   Drug use: No   Sexual activity: Not on file  Other Topics Concern   Not on file  Social History Narrative   Not on file   Social Drivers of Health   Financial Resource Strain: Not on file  Food Insecurity: No Food Insecurity (05/11/2023)   Hunger Vital Sign    Worried About Running Out of Food in the Last Year: Never true    Ran Out of Food in the Last Year: Never true  Transportation Needs: No Transportation Needs (05/11/2023)   PRAPARE - Administrator, Civil Service (Medical): No    Lack of Transportation (Non-Medical): No  Physical Activity: Not on file  Stress: Not on file  Social Connections: Unknown (05/11/2023)   Social Connection and Isolation Panel    Frequency of Communication with Friends and Family: Patient unable to answer    Frequency of Social Gatherings with Friends and Family: Patient unable to answer    Attends Religious Services: Patient declined    Active Member of Clubs or Organizations: Patient declined    Attends Banker Meetings: Patient declined    Marital Status: Patient declined  Intimate Partner Violence: Not At Risk (05/11/2023)   Humiliation, Afraid, Rape, and Kick questionnaire    Fear of Current or Ex-Partner: No    Emotionally Abused: No    Physically Abused: No    Sexually Abused: No    Family History  Problem Relation Age of Onset   Breast cancer Sister 21   Breast cancer Other     Allergies  Allergen Reactions   Lisinopril-Hydrochlorothiazide Other (See Comments)    Review of Systems  Musculoskeletal:  Positive for falls.  Neurological:  Positive for weakness. Negative for loss of consciousness and headaches.  All other systems reviewed and are negative.      Objective:   BP (!) 168/88   Pulse 66   Ht 5' 6 (1.676 m)   SpO2 99%   BMI 20.34 kg/m   Vitals:   01/13/24 1432  BP: (!) 168/88  Pulse: 66  Height: 5' 6 (1.676 m)  Weight:  Comment: patient unable to weigh  SpO2: 99%    Physical Exam Vitals and nursing note reviewed.  Constitutional:      Appearance: Normal appearance. She is normal weight.  HENT:     Head: Normocephalic.  Eyes:     Extraocular Movements: Extraocular movements intact.     Conjunctiva/sclera: Conjunctivae normal.     Pupils: Pupils are equal, round, and reactive to light.  Cardiovascular:     Rate and Rhythm: Normal rate.  Pulmonary:     Effort: Pulmonary effort is normal.  Musculoskeletal:     Left Lower Extremity: Left leg is amputated below knee.  Neurological:     General: No focal deficit present.     Mental Status: She is  alert and oriented to person, place, and time. Mental status is at baseline.  Psychiatric:        Mood and Affect: Mood normal.        Behavior: Behavior normal.        Thought Content: Thought content normal.      No results found for any visits on 01/13/24.  Recent Results (from the past 2160 hours)  Lipid panel     Status: Abnormal   Collection Time: 10/19/23 11:05 AM  Result Value Ref Range   Cholesterol, Total 170 100 - 199 mg/dL   Triglycerides 60 0 - 149 mg/dL   HDL 54 >60 mg/dL   VLDL Cholesterol Cal 12 5 - 40 mg/dL   LDL Chol Calc (NIH) 895 (H) 0 - 99 mg/dL   Chol/HDL Ratio 3.1 0.0 - 4.4 ratio    Comment:                                   T. Chol/HDL Ratio                                             Men  Women                               1/2 Avg.Risk  3.4    3.3                                   Avg.Risk  5.0    4.4                                2X Avg.Risk  9.6    7.1                                3X Avg.Risk 23.4   11.0   VITAMIN D  25 Hydroxy (Vit-D Deficiency, Fractures)     Status: Abnormal   Collection Time: 10/19/23 11:05 AM  Result Value Ref Range   Vit D, 25-Hydroxy 21.0 (L) 30.0 - 100.0 ng/mL    Comment: Vitamin D  deficiency has been defined by the Institute of Medicine and an Endocrine Society practice guideline as  a level of serum 25-OH vitamin D  less than 20 ng/mL (1,2). The Endocrine Society went on to further define vitamin D  insufficiency as a level between 21 and 29 ng/mL (2). 1. IOM (Institute of Medicine). 2010. Dietary reference    intakes for calcium  and D. [place]: The    [employer]. 2. Holick MF, Binkley Taylor, Bischoff-Ferrari HA, et al.    Evaluation, treatment, and prevention of vitamin D     deficiency: an Endocrine Society clinical practice    guideline. JCEM. 2011 Jul; 96(7):1911-30.   CMP14+EGFR     Status: Abnormal   Collection Time: 10/19/23 11:05 AM  Result Value Ref Range   Glucose 92 70 - 99 mg/dL   BUN 12 8 - 27 mg/dL   Creatinine, Ser 9.38 0.57 - 1.00 mg/dL   eGFR 97 >40 fO/fpw/8.26   BUN/Creatinine Ratio 20 12 -  28   Sodium 141 134 - 144 mmol/L   Potassium 4.0 3.5 - 5.2 mmol/L   Chloride 104 96 - 106 mmol/L   CO2 17 (L) 20 - 29 mmol/L   Calcium  9.9 8.7 - 10.3 mg/dL   Total Protein 7.4 6.0 - 8.5 g/dL   Albumin 4.1 3.9 - 4.9 g/dL   Globulin, Total 3.3 1.5 - 4.5 g/dL   Bilirubin Total 0.5 0.0 - 1.2 mg/dL   Alkaline Phosphatase 85 44 - 121 IU/L   AST 21 0 - 40 IU/L   ALT 9 0 - 32 IU/L  TSH     Status: None   Collection Time: 10/19/23 11:05 AM  Result Value Ref Range   TSH 1.330 0.450 - 4.500 uIU/mL  Hemoglobin A1c     Status: None   Collection Time: 10/19/23 11:05 AM  Result Value Ref Range   Hgb A1c MFr Bld 5.4 4.8 - 5.6 %    Comment:          Prediabetes: 5.7 - 6.4          Diabetes: >6.4          Glycemic control for adults with diabetes: <7.0    Est. average glucose Bld gHb Est-mCnc 108 mg/dL  Vitamin B12     Status: None   Collection Time: 10/19/23 11:05 AM  Result Value Ref Range   Vitamin B-12 476 232 - 1,245 pg/mL  CBC with Diff     Status: Abnormal   Collection Time: 10/19/23 11:05 AM  Result Value Ref Range   WBC 5.0 3.4 - 10.8 x10E3/uL   RBC 3.44 (L) 3.77 - 5.28 x10E6/uL   Hemoglobin 11.2 11.1 - 15.9 g/dL   Hematocrit 64.2  65.9 - 46.6 %   MCV 104 (H) 79 - 97 fL   MCH 32.6 26.6 - 33.0 pg   MCHC 31.4 (L) 31.5 - 35.7 g/dL   RDW 84.7 88.2 - 84.5 %   Platelets 302 150 - 450 x10E3/uL   Neutrophils 47 Not Estab. %   Lymphs 40 Not Estab. %   Monocytes 12 Not Estab. %   Eos 1 Not Estab. %   Basos 0 Not Estab. %   Neutrophils Absolute 2.4 1.4 - 7.0 x10E3/uL   Lymphocytes Absolute 2.0 0.7 - 3.1 x10E3/uL   Monocytes Absolute 0.6 0.1 - 0.9 x10E3/uL   EOS (ABSOLUTE) 0.0 0.0 - 0.4 x10E3/uL   Basophils Absolute 0.0 0.0 - 0.2 x10E3/uL   Immature Granulocytes 0 Not Estab. %   Immature Grans (Abs) 0.0 0.0 - 0.1 x10E3/uL  Aerobic culture     Status: Abnormal   Collection Time: 11/15/23  2:25 AM  Result Value Ref Range   Aerobic Bacterial Culture Final report (A)    Organism ID, Bacteria Comment (A)     Comment: Enterobacter cloacae complex Some Enterobacterales may develop resistance during therapy with third-generation cephalosporins. This resistance is most commonly seen with Citrobacter freundii complex, Enterobacter cloacae complex, and Klebsiella aerogenes. Isolates that initially test susceptible may become resistant within a few days after initiation of therapy. Testing subsequent isolates may be warranted if clinically indicated. (CLSI [id number]) Carbapenem-resistant Enterobacteriaceae (CRE) Scant growth    Organism ID, Bacteria Mixed skin flora     Comment: Heavy growth   Antimicrobial Susceptibility Comment     Comment:       ** S = Susceptible; I = Intermediate; R = Resistant **  P = Positive; N = Negative             MICS are expressed in micrograms per mL    Antibiotic                 RSLT#1    RSLT#2    RSLT#3    RSLT#4 Amoxicillin /Clavulanic Acid    R Cefepime                       S Cefoxitin                      R Cefpodoxime                    S Ertapenem                      R Gentamicin                     S Levofloxacin                   S Tetracycline                    S Tobramycin                     S Trimethoprim /Sulfa              S        Assessment & Plan Morbid (severe) obesity due to excess calories (HCC) Continue current meds.  Will adjust as needed based on results.  The patient is asked to make an attempt to improve diet and exercise patterns to aid in medical management of this problem. Addressed importance of increasing and maintaining water intake.   Fall from motorized mobility scooter, initial encounter Primary osteoarthritis of right hip Status post total replacement of right hip S/P BKA (below knee amputation) unilateral, left (HCC) Peripheral vascular disease Has been using a friend's scooter, which is intended for someone who is left handed and she attributes the fall to this.  We have sent her paperwork to [facility], but they told her that they hadn't gotten it, even though she did get her incontinence supplies and the paperwork was all sent together.  We will get in touch with them and determine if we need to resend her paperwork.   Mixed stress and urge urinary incontinence Patient stable.  Well controlled with current therapy.   Continue current treatment.  Reassess as needed.     Return in about 1 month (around 02/13/2024) for AWV.   Total time spent: 20 minutes  ALAN CHRISTELLA ARRANT, FNP  01/13/2024   This document may have been prepared by [facility] Voice Recognition software and as such may include unintentional dictation errors.

## 2024-01-15 NOTE — Assessment & Plan Note (Signed)
 Has been using a friend's scooter, which is intended for someone who is left handed and she attributes the fall to this.  We have sent her paperwork to [facility], but they told her that they hadn't gotten it, even though she did get her incontinence supplies and the paperwork was all sent together.  We will get in touch with them and determine if we need to resend her paperwork.

## 2024-01-15 NOTE — Assessment & Plan Note (Signed)
 Patient stable.  Well controlled with current therapy.   Continue current treatment.  Reassess as needed.

## 2024-01-15 NOTE — Assessment & Plan Note (Signed)
 Continue current meds.  Will adjust as needed based on results.  The patient is asked to make an attempt to improve diet and exercise patterns to aid in medical management of this problem. Addressed importance of increasing and maintaining water  intake.

## 2024-01-26 ENCOUNTER — Encounter

## 2024-02-09 ENCOUNTER — Telehealth: Payer: Self-pay | Admitting: Family

## 2024-02-09 NOTE — Telephone Encounter (Signed)
 Patient's daughter left VM that she is trying to get some CAP papers completed for her mother and would like to know how she can get those to us  for Alan to complete them so the patient can start using these services.   She can drop the forms off at the front desk and we will call her when they are ready.

## 2024-02-13 ENCOUNTER — Ambulatory Visit: Admitting: Family

## 2024-02-13 DIAGNOSIS — Z89512 Acquired absence of left leg below knee: Secondary | ICD-10-CM | POA: Diagnosis not present

## 2024-02-13 DIAGNOSIS — M25661 Stiffness of right knee, not elsewhere classified: Secondary | ICD-10-CM | POA: Diagnosis not present

## 2024-02-14 ENCOUNTER — Other Ambulatory Visit: Payer: Self-pay | Admitting: Family

## 2024-02-14 NOTE — Telephone Encounter (Signed)
 P/t's daughter informed

## 2024-02-15 MED ORDER — OXYCODONE HCL 5 MG PO TABS: 5.0000 mg | ORAL_TABLET | Freq: Four times a day (QID) | ORAL | 0 refills | Status: DC | PRN

## 2024-02-16 DIAGNOSIS — Z89512 Acquired absence of left leg below knee: Secondary | ICD-10-CM | POA: Diagnosis not present

## 2024-02-16 DIAGNOSIS — I739 Peripheral vascular disease, unspecified: Secondary | ICD-10-CM | POA: Diagnosis not present

## 2024-02-16 DIAGNOSIS — Z89421 Acquired absence of other right toe(s): Secondary | ICD-10-CM | POA: Diagnosis not present

## 2024-02-16 DIAGNOSIS — E119 Type 2 diabetes mellitus without complications: Secondary | ICD-10-CM | POA: Diagnosis not present

## 2024-02-16 DIAGNOSIS — B351 Tinea unguium: Secondary | ICD-10-CM | POA: Diagnosis not present

## 2024-02-17 ENCOUNTER — Telehealth (INDEPENDENT_AMBULATORY_CARE_PROVIDER_SITE_OTHER): Payer: Self-pay

## 2024-02-17 NOTE — Telephone Encounter (Signed)
 See note below, can you please move up patients appointment and studies per FB.

## 2024-02-17 NOTE — Telephone Encounter (Signed)
 Patient called at this time stating she seen Dr. Lurena) yesterday and there was some concern about her R foot being cold and there were concerns about her circulation at this time. Please advise.

## 2024-02-17 NOTE — Telephone Encounter (Signed)
 Let's move up her appt and studies

## 2024-02-22 ENCOUNTER — Encounter

## 2024-03-13 ENCOUNTER — Telehealth: Payer: Self-pay | Admitting: Family

## 2024-03-13 ENCOUNTER — Other Ambulatory Visit: Payer: Self-pay

## 2024-03-13 MED ORDER — OXYCODONE HCL 5 MG PO TABS: 5.0000 mg | ORAL_TABLET | Freq: Four times a day (QID) | ORAL | 0 refills | Status: DC | PRN

## 2024-03-13 NOTE — Telephone Encounter (Signed)
 Jackie Macias with [employer] left VM stating that they need a letter to get patient into a handicap accessible apartment. The letter needs to state the accommodations that the patient needs and how it will help with her ADLs to have a handicap accessible apartment.

## 2024-03-14 ENCOUNTER — Telehealth: Payer: Self-pay

## 2024-03-14 NOTE — Telephone Encounter (Signed)
 Pt needs a doctor not in order to get approved for housing. Transport planner is requesting a call back.

## 2024-03-15 ENCOUNTER — Encounter: Payer: Self-pay | Admitting: Family

## 2024-03-15 NOTE — Telephone Encounter (Signed)
 Letter typed and printed for signature

## 2024-03-16 NOTE — Telephone Encounter (Signed)
 Left a VM Phone number [phone] extenison- [phone]

## 2024-03-20 NOTE — Telephone Encounter (Signed)
 Left message to return call

## 2024-03-23 NOTE — Telephone Encounter (Signed)
 Left message to return call

## 2024-03-27 ENCOUNTER — Other Ambulatory Visit: Payer: Self-pay | Admitting: Family

## 2024-03-28 ENCOUNTER — Other Ambulatory Visit: Payer: Self-pay

## 2024-04-02 ENCOUNTER — Encounter: Payer: Self-pay | Admitting: Family

## 2024-04-02 ENCOUNTER — Ambulatory Visit: Admitting: Family

## 2024-04-02 VITALS — BP 194/90 | Ht 66.0 in

## 2024-04-02 DIAGNOSIS — Z89512 Acquired absence of left leg below knee: Secondary | ICD-10-CM

## 2024-04-02 DIAGNOSIS — J449 Chronic obstructive pulmonary disease, unspecified: Secondary | ICD-10-CM

## 2024-04-02 DIAGNOSIS — I70222 Atherosclerosis of native arteries of extremities with rest pain, left leg: Secondary | ICD-10-CM | POA: Insufficient documentation

## 2024-04-02 DIAGNOSIS — E785 Hyperlipidemia, unspecified: Secondary | ICD-10-CM

## 2024-04-02 DIAGNOSIS — I7025 Atherosclerosis of native arteries of other extremities with ulceration: Secondary | ICD-10-CM

## 2024-04-02 DIAGNOSIS — I10 Essential (primary) hypertension: Secondary | ICD-10-CM

## 2024-04-02 DIAGNOSIS — M1A00X0 Idiopathic chronic gout, unspecified site, without tophus (tophi): Secondary | ICD-10-CM

## 2024-04-02 MED ORDER — OXYCODONE HCL 10 MG PO TABS: 10.0000 mg | ORAL_TABLET | Freq: Four times a day (QID) | ORAL | 0 refills | Status: DC | PRN

## 2024-04-02 MED ORDER — AMLODIPINE BESYLATE 5 MG PO TABS: 5.0000 mg | ORAL_TABLET | Freq: Every day | ORAL | 1 refills | Status: AC

## 2024-04-02 NOTE — Progress Notes (Unsigned)
 "  Established Patient Office Visit  Subjective:  Patient ID: Jackie Macias, female    DOB: 1956-02-07  Age: 69 y.o. MRN: [id number]  Chief Complaint  Patient presents with   Fall    Still has not gotten her chair.    Fall The fall occurred from a bed. She fell from a height of 3 to 5 ft. She landed on Hard floor. There was no blood loss. The point of impact was the head. The pain is present in the head, neck and left shoulder. The pain is at a severity of 10/10. The pain is severe. The symptoms are aggravated by movement, pressure on injury and rotation. Associated symptoms include headaches. Associated symptoms comments: Saw stars when she fell. She has tried acetaminophen , rest and ice for the symptoms. The treatment provided no relief.    No other concerns at this time.   Past Medical History:  Diagnosis Date   Arthritis    Bronchitis    COPD (chronic obstructive pulmonary disease) (HCC)    GERD (gastroesophageal reflux disease)    Gout    Hypertension     Past Surgical History:  Procedure Laterality Date   AMPUTATION Right 07/29/2017   Procedure: AMPUTATION DIGIT/ 71174;  Surgeon: Neill Boas, DPM;  Location: [facility];  Service: Podiatry;  Laterality: Right;   AMPUTATION Left 05/13/2023   Procedure: AMPUTATION BELOW KNEE;  Surgeon: Jama Cordella MATSU, MD;  Location: [facility];  Service: Vascular;  Laterality: Left;   AMPUTATION TOE Right 02/18/2021   Procedure: AMPUTATION TOE-GREAT TOE;  Surgeon: Ashley Soulier, DPM;  Location: [facility];  Service: Podiatry;  Laterality: Right;   CHOLECYSTECTOMY     facial biopsy Right    JOINT REPLACEMENT Right 12/2016   THR   LOWER EXTREMITY ANGIOGRAPHY Right 09/27/2017   Procedure: LOWER EXTREMITY ANGIOGRAPHY;  Surgeon: Jama Cordella MATSU, MD;  Location: [facility];  Service: Cardiovascular;  Laterality: Right;   LOWER EXTREMITY ANGIOGRAPHY Right 06/17/2020   Procedure: LOWER EXTREMITY ANGIOGRAPHY;  Surgeon: Jama Cordella MATSU, MD;  Location: [facility];  Service: Cardiovascular;  Laterality: Right;   LOWER EXTREMITY ANGIOGRAPHY Right 02/18/2021   Procedure: LOWER EXTREMITY ANGIOGRAPHY;  Surgeon: Jama Cordella MATSU, MD;  Location: [facility];  Service: Cardiovascular;  Laterality: Right;   LOWER EXTREMITY ANGIOGRAPHY Left 09/07/2022   Procedure: Lower Extremity Angiography;  Surgeon: Jama Cordella MATSU, MD;  Location: [facility];  Service: Cardiovascular;  Laterality: Left;   LOWER EXTREMITY ANGIOGRAPHY Left 03/07/2023   Procedure: Lower Extremity Angiography;  Surgeon: Marea Selinda RAMAN, MD;  Location: [facility];  Service: Cardiovascular;  Laterality: Left;   LOWER EXTREMITY ANGIOGRAPHY Left 03/08/2023   Procedure: Lower Extremity Angiography;  Surgeon: Jama Cordella MATSU, MD;  Location: [facility];  Service: Cardiovascular;  Laterality: Left;   LOWER EXTREMITY ANGIOGRAPHY Left 05/11/2023   Procedure: Lower Extremity Angiography;  Surgeon: Jama Cordella MATSU, MD;  Location: [facility];  Service: Cardiovascular;  Laterality: Left;   TONSILLECTOMY     TOTAL HIP ARTHROPLASTY Right 01/17/2017   Procedure: TOTAL HIP ARTHROPLASTY ANTERIOR APPROACH;  Surgeon: Leora Lynwood SAUNDERS, MD;  Location: [facility];  Service: Orthopedics;  Laterality: Right;    Social History   Socioeconomic History   Marital status: Single    Spouse name: Not on file   Number of children: Not on file   Years of education: Not on file   Highest education level: Not  on file  Occupational History   Not on file  Tobacco Use   Smoking status: Every Day    Current packs/day: 1.00    Average packs/day: 1 pack/day for 50.0 years (50.0 ttl pk-yrs)    Types: Cigarettes   Smokeless tobacco: Never  Vaping Use   Vaping status: Never Used  Substance and Sexual Activity   Alcohol use: Yes    Alcohol/week: 3.0 standard drinks of alcohol    Types: 3 Cans of beer per week    Comment: occasional   Drug  use: No   Sexual activity: Not on file  Other Topics Concern   Not on file  Social History Narrative   Not on file   Social Drivers of Health   Tobacco Use: High Risk (04/02/2024)   Patient History    Smoking Tobacco Use: Every Day    Smokeless Tobacco Use: Never    Passive Exposure: Not on file  Financial Resource Strain: Not on file  Food Insecurity: No Food Insecurity (05/11/2023)   Hunger Vital Sign    Worried About Running Out of Food in the Last Year: Never true    Ran Out of Food in the Last Year: Never true  Transportation Needs: No Transportation Needs (05/11/2023)   PRAPARE - Administrator, Civil Service (Medical): No    Lack of Transportation (Non-Medical): No  Physical Activity: Not on file  Stress: Not on file  Social Connections: Unknown (05/11/2023)   Social Connection and Isolation Panel    Frequency of Communication with Friends and Family: Patient unable to answer    Frequency of Social Gatherings with Friends and Family: Patient unable to answer    Attends Religious Services: Patient declined    Active Member of Clubs or Organizations: Patient declined    Attends Banker Meetings: Patient declined    Marital Status: Patient declined  Intimate Partner Violence: Not At Risk (05/11/2023)   Humiliation, Afraid, Rape, and Kick questionnaire    Fear of Current or Ex-Partner: No    Emotionally Abused: No    Physically Abused: No    Sexually Abused: No  Depression (PHQ2-9): High Risk (04/29/2023)   Depression (PHQ2-9)    PHQ-2 Score: 11  Alcohol Screen: Not on file  Housing: High Risk (05/11/2023)   Housing Stability Vital Sign    Unable to Pay for Housing in the Last Year: Yes    Number of Times Moved in the Last Year: 1    Homeless in the Last Year: Yes  Utilities: Not At Risk (05/11/2023)   [facility] Utilities    Threatened with loss of utilities: No  Health Literacy: Not on file    Family History  Problem Relation Age of Onset   Breast  cancer Sister 19   Breast cancer Other     Allergies[1]  Review of Systems  Neurological:  Positive for headaches.  All other systems reviewed and are negative.      Objective:   BP (!) 194/90   Ht 5' 6 (1.676 m)   BMI 20.34 kg/m   Vitals:   04/02/24 1114  BP: (!) 194/90  Height: 5' 6 (1.676 m)  Weight: Comment: patient unable to weigh    Physical Exam Vitals and nursing note reviewed.  Constitutional:      Appearance: Normal appearance. She is normal weight.  HENT:     Head: Normocephalic.  Eyes:     Extraocular Movements: Extraocular movements intact.  Conjunctiva/sclera: Conjunctivae normal.     Pupils: Pupils are equal, round, and reactive to light.  Cardiovascular:     Rate and Rhythm: Normal rate.  Pulmonary:     Effort: Pulmonary effort is normal.  Neurological:     General: No focal deficit present.     Mental Status: She is alert and oriented to person, place, and time. Mental status is at baseline.  Psychiatric:        Mood and Affect: Mood normal.        Behavior: Behavior normal.        Thought Content: Thought content normal.      No results found for any visits on 04/02/24.  No results found for this or any previous visit (from the past 2160 hours).     Assessment & Plan Atherosclerosis of native arteries of extremities with rest pain, left leg (HCC)  Atherosclerosis of native arteries of the extremities with ulceration (HCC)  S/P BKA (below knee amputation) unilateral, left (HCC)  Chronic obstructive pulmonary disease, unspecified COPD type (HCC)  Morbid (severe) obesity due to excess calories (HCC)  Essential hypertension  Hyperlipidemia, unspecified hyperlipidemia type  Idiopathic chronic gout without tophus, unspecified site     No follow-ups on file.   Total time spent: {AMA time spent:29001} minutes  ALAN CHRISTELLA ARRANT, FNP  04/02/2024   This document may have been prepared by [facility] Voice Recognition software  and as such may include unintentional dictation errors.     [1]  Allergies Allergen Reactions   Lisinopril-Hydrochlorothiazide Other (See Comments)   "

## 2024-04-03 ENCOUNTER — Telehealth: Payer: Self-pay

## 2024-04-03 NOTE — Progress Notes (Signed)
" ° °  04/03/2024  Patient ID: Jackie Macias, female   DOB: [birth year], 69 y.o.   MRN: [id number]  Attempted to contact patient to schedule appointment for medication management. Left HIPAA compliant message for patient to return my call at their convenience.   Jon VEAR Lindau, PharmD Clinical Pharmacist [phone]  "

## 2024-04-12 ENCOUNTER — Telehealth: Payer: Self-pay | Admitting: Family

## 2024-04-12 NOTE — Telephone Encounter (Signed)
 Patient left VM stating that [employer] is missing one paper that they need before they can give her her electric wheelchair.    We need to call [employer] and see which paper they need.

## 2024-04-17 ENCOUNTER — Telehealth: Payer: Self-pay

## 2024-04-17 NOTE — Progress Notes (Signed)
" ° °  04/17/2024  Patient ID: Jackie Macias, female   DOB: [birth year], 69 y.o.   MRN: [id number]  Attempted to contact patient to schedule appointment for medication management. Left HIPAA compliant message for patient to return my call at their convenience.   Jon VEAR Lindau, PharmD Clinical Pharmacist [phone]   "

## 2024-04-18 ENCOUNTER — Ambulatory Visit (INDEPENDENT_AMBULATORY_CARE_PROVIDER_SITE_OTHER): Admitting: Nurse Practitioner

## 2024-04-27 ENCOUNTER — Other Ambulatory Visit: Payer: Self-pay | Admitting: Family

## 2024-05-03 ENCOUNTER — Telehealth (INDEPENDENT_AMBULATORY_CARE_PROVIDER_SITE_OTHER): Payer: Self-pay

## 2024-05-03 NOTE — Telephone Encounter (Signed)
 Per message on nurse triage line pt would like call back to reschedule missed appointment. Please reach out for scheduling

## 2024-05-03 NOTE — Telephone Encounter (Signed)
 Pt called and left vm regarding rx refill for pain meds, seemed very upset in voicemail please advise

## 2024-05-04 ENCOUNTER — Telehealth: Payer: Self-pay

## 2024-05-04 MED ORDER — OXYCODONE HCL 10 MG PO TABS: 10.0000 mg | ORAL_TABLET | Freq: Four times a day (QID) | ORAL | 0 refills | Status: AC | PRN

## 2024-05-04 NOTE — Telephone Encounter (Signed)
 Pt called and stated she is having severe hip pain and is requesting something be sent in for it.
